# Patient Record
Sex: Male | Born: 1955
Health system: Southern US, Community
[De-identification: ages and names within clinical notes are randomized; demographics above are authoritative.]

## PROBLEM LIST (undated history)

## (undated) DIAGNOSIS — R61 Generalized hyperhidrosis: Secondary | ICD-10-CM

## (undated) DIAGNOSIS — M545 Low back pain, unspecified: Secondary | ICD-10-CM

## (undated) DIAGNOSIS — M961 Postlaminectomy syndrome, not elsewhere classified: Secondary | ICD-10-CM

## (undated) DIAGNOSIS — N319 Neuromuscular dysfunction of bladder, unspecified: Secondary | ICD-10-CM

## (undated) DIAGNOSIS — Z0000 Encounter for general adult medical examination without abnormal findings: Secondary | ICD-10-CM

## (undated) DIAGNOSIS — G8929 Other chronic pain: Secondary | ICD-10-CM

## (undated) DIAGNOSIS — M5136 Other intervertebral disc degeneration, lumbar region: Secondary | ICD-10-CM

## (undated) DIAGNOSIS — G894 Chronic pain syndrome: Secondary | ICD-10-CM

## (undated) DIAGNOSIS — B182 Chronic viral hepatitis C: Secondary | ICD-10-CM

## (undated) DIAGNOSIS — K118 Other diseases of salivary glands: Secondary | ICD-10-CM

## (undated) DIAGNOSIS — M47816 Spondylosis without myelopathy or radiculopathy, lumbar region: Secondary | ICD-10-CM

## (undated) DIAGNOSIS — N400 Enlarged prostate without lower urinary tract symptoms: Secondary | ICD-10-CM

## (undated) DIAGNOSIS — K219 Gastro-esophageal reflux disease without esophagitis: Secondary | ICD-10-CM

## (undated) DIAGNOSIS — N529 Male erectile dysfunction, unspecified: Secondary | ICD-10-CM

## (undated) DIAGNOSIS — E785 Hyperlipidemia, unspecified: Secondary | ICD-10-CM

## (undated) DIAGNOSIS — I10 Essential (primary) hypertension: Secondary | ICD-10-CM

## (undated) DIAGNOSIS — M25462 Effusion, left knee: Secondary | ICD-10-CM

## (undated) DIAGNOSIS — G4733 Obstructive sleep apnea (adult) (pediatric): Secondary | ICD-10-CM

## (undated) DIAGNOSIS — E119 Type 2 diabetes mellitus without complications: Secondary | ICD-10-CM

## (undated) DIAGNOSIS — F4310 Post-traumatic stress disorder, unspecified: Secondary | ICD-10-CM

## (undated) DIAGNOSIS — M1710 Unilateral primary osteoarthritis, unspecified knee: Secondary | ICD-10-CM

## (undated) DIAGNOSIS — IMO0001 Reserved for inherently not codable concepts without codable children: Secondary | ICD-10-CM

## (undated) DIAGNOSIS — R7303 Prediabetes: Secondary | ICD-10-CM

## (undated) DIAGNOSIS — M179 Osteoarthritis of knee, unspecified: Secondary | ICD-10-CM

## (undated) DIAGNOSIS — M549 Dorsalgia, unspecified: Secondary | ICD-10-CM

## (undated) DIAGNOSIS — R0600 Dyspnea, unspecified: Secondary | ICD-10-CM

## (undated) DIAGNOSIS — F32A Depression, unspecified: Secondary | ICD-10-CM

## (undated) DIAGNOSIS — Z5189 Encounter for other specified aftercare: Secondary | ICD-10-CM

## (undated) DIAGNOSIS — F329 Major depressive disorder, single episode, unspecified: Secondary | ICD-10-CM

## (undated) DIAGNOSIS — Z8619 Personal history of other infectious and parasitic diseases: Secondary | ICD-10-CM

## (undated) DIAGNOSIS — Z872 Personal history of diseases of the skin and subcutaneous tissue: Secondary | ICD-10-CM

## (undated) DIAGNOSIS — S838X9A Sprain of other specified parts of unspecified knee, initial encounter: Secondary | ICD-10-CM

## (undated) HISTORY — DX: Depression, unspecified: F32.A

## (undated) HISTORY — DX: Osteoarthritis of knee, unspecified: M17.9

## (undated) HISTORY — PX: COLON SURGERY: SHX602

## (undated) HISTORY — DX: Hyperlipidemia, unspecified: E78.5

## (undated) HISTORY — DX: Gastro-esophageal reflux disease without esophagitis: K21.9

## (undated) HISTORY — DX: Unilateral primary osteoarthritis, unspecified knee: M17.10

## (undated) HISTORY — PX: CARDIOVASCULAR STRESS TEST: SHX262

## (undated) HISTORY — DX: Benign prostatic hyperplasia without lower urinary tract symptoms: N40.0

## (undated) HISTORY — DX: Male erectile dysfunction, unspecified: N52.9

## (undated) HISTORY — DX: Major depressive disorder, single episode, unspecified: F32.9

## (undated) HISTORY — DX: Chronic viral hepatitis C: B18.2

## (undated) HISTORY — DX: Post-traumatic stress disorder, unspecified: F43.10

## (undated) MED ORDER — BUPRENORPHINE-NALOXONE 8 MG-2 MG SUBLINGUAL FILM
8-2 mg | ORAL_FILM | Freq: Three times a day (TID) | SUBLINGUAL | Status: DC
Start: ? — End: 2012-12-24

## (undated) MED ORDER — PSEUDOEPHEDRINE 30 MG TAB
30 mg | ORAL_TABLET | Freq: Four times a day (QID) | ORAL | Status: DC | PRN
Start: ? — End: 2014-06-24

## (undated) MED ORDER — PANTOPRAZOLE 20 MG TAB, DELAYED RELEASE
20 mg | ORAL_TABLET | Freq: Two times a day (BID) | ORAL | Status: DC
Start: ? — End: 2014-06-24

## (undated) MED ORDER — AZITHROMYCIN 250 MG TAB
250 mg | ORAL_TABLET | ORAL | Status: DC
Start: ? — End: 2012-10-18

## (undated) MED ORDER — FENTANYL 25 MCG/HR 72 HR TRANSDERM PATCH
25 mcg/hr | MEDICATED_PATCH | TRANSDERMAL | Status: DC
Start: ? — End: 2012-10-18

## (undated) MED ORDER — TADALAFIL 20 MG TABLET
20 mg | ORAL_TABLET | ORAL | Status: DC | PRN
Start: ? — End: 2014-06-24

## (undated) MED ORDER — FENTANYL 50 MCG/HR 72 HR TRANSDERM PATCH
50 mcg/hr | MEDICATED_PATCH | TRANSDERMAL | Status: DC
Start: ? — End: 2012-10-18

## (undated) MED ORDER — BUPRENORPHINE-NALOXONE 8 MG-2 MG SUBLINGUAL FILM
8-2 mg | ORAL_FILM | Freq: Three times a day (TID) | SUBLINGUAL | Status: DC
Start: ? — End: 2013-11-14

## (undated) MED ORDER — MECLIZINE 25 MG TAB
25 mg | ORAL_TABLET | Freq: Three times a day (TID) | ORAL | Status: AC | PRN
Start: ? — End: 2013-03-22

## (undated) MED ORDER — DULOXETINE 30 MG CAP, DELAYED RELEASE
30 mg | ORAL_CAPSULE | Freq: Every day | ORAL | Status: DC
Start: ? — End: 2012-10-18

## (undated) MED ORDER — MECLIZINE 25 MG TAB
25 mg | ORAL_TABLET | Freq: Three times a day (TID) | ORAL | Status: DC | PRN
Start: ? — End: 2014-06-24

## (undated) MED ORDER — DULOXETINE 60 MG CAP, DELAYED RELEASE
60 mg | ORAL_CAPSULE | Freq: Every day | ORAL | Status: DC
Start: ? — End: 2012-10-18

## (undated) MED ORDER — OXYCODONE-ACETAMINOPHEN 7.5 MG-325 MG TAB
ORAL_TABLET | Freq: Three times a day (TID) | ORAL | Status: DC | PRN
Start: ? — End: 2012-10-18

## (undated) MED ORDER — BUPRENORPHINE 5 MCG/HOUR WEEKLY TRANSDERMAL PATCH
5 mcg/hour | MEDICATED_PATCH | TRANSDERMAL | Status: AC
Start: ? — End: 2013-02-09

## (undated) MED ORDER — AZITHROMYCIN 250 MG TAB
250 mg | ORAL_TABLET | ORAL | Status: AC
Start: ? — End: 2013-03-17

## (undated) MED ORDER — LORAZEPAM 0.5 MG TAB
0.5 mg | ORAL_TABLET | Freq: Three times a day (TID) | ORAL | Status: DC | PRN
Start: ? — End: 2014-08-07

## (undated) MED ORDER — FLUTICASONE 50 MCG/ACTUATION NASAL SPRAY, SUSP
50 mcg/actuation | NASAL | Status: DC
Start: ? — End: 2014-08-07

## (undated) MED ORDER — LIDOCAINE 5 % (700 MG/PATCH) ADHESIVE PATCH
5 % | MEDICATED_PATCH | CUTANEOUS | Status: DC
Start: ? — End: 2012-12-24

## (undated) MED ORDER — METHYLPREDNISOLONE 4 MG TABS IN A DOSE PACK
4 mg | PACK | ORAL | Status: DC
Start: ? — End: 2012-10-18

## (undated) MED ORDER — BUPRENORPHINE-NALOXONE 8 MG-2 MG SUBLINGUAL TAB
8-2 mg | ORAL_TABLET | Freq: Three times a day (TID) | SUBLINGUAL | Status: DC | PRN
Start: ? — End: 2013-03-12

## (undated) MED ORDER — TADALAFIL 20 MG TABLET
20 mg | ORAL_TABLET | ORAL | Status: DC | PRN
Start: ? — End: 2012-11-18

## (undated) MED ORDER — SUCRALFATE 1 GRAM TAB
1 gram | ORAL_TABLET | Freq: Four times a day (QID) | ORAL | Status: DC
Start: ? — End: 2012-10-18

## (undated) MED ORDER — AMOXICILLIN 500 MG CAP
500 mg | ORAL_CAPSULE | Freq: Two times a day (BID) | ORAL | Status: AC
Start: ? — End: 2013-06-22

## (undated) MED ORDER — FENTANYL 50 MCG/HR 72 HR TRANSDERM PATCH
50 mcg/hr | MEDICATED_PATCH | TRANSDERMAL | Status: DC
Start: ? — End: 2012-11-12

## (undated) MED ORDER — OXYCODONE-ACETAMINOPHEN 7.5 MG-325 MG TAB
ORAL_TABLET | Freq: Three times a day (TID) | ORAL | Status: DC | PRN
Start: ? — End: 2012-11-12

---

## 1982-06-05 HISTORY — PX: LUMBAR FUSION: SHX111

## 1998-02-22 ENCOUNTER — Encounter: Admission: RE | Admit: 1998-02-22 | Discharge: 1998-05-23 | Payer: Self-pay

## 2000-07-02 ENCOUNTER — Emergency Department (HOSPITAL_COMMUNITY): Admission: EM | Admit: 2000-07-02 | Discharge: 2000-07-02 | Payer: Self-pay | Admitting: Emergency Medicine

## 2002-07-24 ENCOUNTER — Ambulatory Visit (HOSPITAL_COMMUNITY): Admission: RE | Admit: 2002-07-24 | Discharge: 2002-07-24 | Payer: Self-pay | Admitting: Internal Medicine

## 2002-07-24 ENCOUNTER — Encounter: Payer: Self-pay | Admitting: Internal Medicine

## 2002-09-24 ENCOUNTER — Encounter (INDEPENDENT_AMBULATORY_CARE_PROVIDER_SITE_OTHER): Payer: Self-pay | Admitting: Specialist

## 2002-09-24 ENCOUNTER — Encounter (INDEPENDENT_AMBULATORY_CARE_PROVIDER_SITE_OTHER): Payer: Self-pay | Admitting: *Deleted

## 2002-09-24 ENCOUNTER — Ambulatory Visit (HOSPITAL_COMMUNITY): Admission: RE | Admit: 2002-09-24 | Discharge: 2002-09-24 | Payer: Self-pay | Admitting: Internal Medicine

## 2003-03-17 ENCOUNTER — Encounter: Payer: Self-pay | Admitting: Internal Medicine

## 2003-03-26 ENCOUNTER — Encounter: Payer: Self-pay | Admitting: Internal Medicine

## 2003-03-26 ENCOUNTER — Ambulatory Visit (HOSPITAL_COMMUNITY): Admission: RE | Admit: 2003-03-26 | Discharge: 2003-03-26 | Payer: Self-pay | Admitting: Internal Medicine

## 2003-06-22 ENCOUNTER — Encounter: Admission: RE | Admit: 2003-06-22 | Discharge: 2003-09-08 | Payer: Self-pay | Admitting: Neurology

## 2003-09-10 ENCOUNTER — Encounter: Admission: RE | Admit: 2003-09-10 | Discharge: 2003-12-09 | Payer: Self-pay | Admitting: Neurology

## 2004-01-06 ENCOUNTER — Encounter: Payer: Self-pay | Admitting: Internal Medicine

## 2004-01-06 LAB — HM COLONOSCOPY

## 2004-05-16 ENCOUNTER — Ambulatory Visit: Payer: Self-pay | Admitting: Internal Medicine

## 2004-05-18 ENCOUNTER — Ambulatory Visit: Payer: Self-pay | Admitting: Internal Medicine

## 2004-06-05 HISTORY — PX: CERVICAL FUSION: SHX112

## 2004-08-15 ENCOUNTER — Ambulatory Visit: Payer: Self-pay | Admitting: *Deleted

## 2004-08-15 ENCOUNTER — Ambulatory Visit (HOSPITAL_BASED_OUTPATIENT_CLINIC_OR_DEPARTMENT_OTHER): Admission: RE | Admit: 2004-08-15 | Discharge: 2004-08-15 | Payer: Self-pay | Admitting: *Deleted

## 2005-03-27 ENCOUNTER — Ambulatory Visit: Payer: Self-pay | Admitting: Internal Medicine

## 2005-03-31 ENCOUNTER — Ambulatory Visit: Payer: Self-pay | Admitting: Internal Medicine

## 2005-08-02 ENCOUNTER — Ambulatory Visit: Payer: Self-pay | Admitting: Internal Medicine

## 2005-08-18 ENCOUNTER — Ambulatory Visit: Payer: Self-pay | Admitting: Internal Medicine

## 2005-11-01 ENCOUNTER — Ambulatory Visit: Payer: Self-pay | Admitting: Internal Medicine

## 2005-12-11 ENCOUNTER — Ambulatory Visit: Payer: Self-pay | Admitting: Internal Medicine

## 2005-12-11 ENCOUNTER — Encounter: Admission: RE | Admit: 2005-12-11 | Discharge: 2005-12-11 | Payer: Self-pay | Admitting: Surgery

## 2006-06-18 ENCOUNTER — Ambulatory Visit: Payer: Self-pay | Admitting: Internal Medicine

## 2006-06-18 LAB — CONVERTED CEMR LAB
AST: 27 units/L (ref 0–37)
Albumin: 3.5 g/dL (ref 3.5–5.2)
Bilirubin Urine: NEGATIVE
Chol/HDL Ratio, serum: 3.8
Cholesterol: 148 mg/dL (ref 0–200)
Eosinophil percent: 2.7 % (ref 0.0–5.0)
GFR calc non Af Amer: 75 mL/min
Glomerular Filtration Rate, Af Am: 90 mL/min/{1.73_m2}
LDL Cholesterol: 94 mg/dL (ref 0–99)
Leukocytes, UA: NEGATIVE
Lymphocytes Relative: 44.9 % (ref 12.0–46.0)
Neutro Abs: 2.4 10*3/uL (ref 1.4–7.7)
Neutrophils Relative %: 42.2 % — ABNORMAL LOW (ref 43.0–77.0)
PSA: 0.75 ng/mL (ref 0.10–4.00)
Platelets: 221 10*3/uL (ref 150–400)
RDW: 12.1 % (ref 11.5–14.6)
Sodium: 142 meq/L (ref 135–145)
TSH: 1.73 microintl units/mL (ref 0.35–5.50)
Total Bilirubin: 0.6 mg/dL (ref 0.3–1.2)
Total Protein, Urine: NEGATIVE mg/dL
Total Protein: 7.3 g/dL (ref 6.0–8.3)
Triglyceride fasting, serum: 78 mg/dL (ref 0–149)
Urobilinogen, UA: 0.2 (ref 0.0–1.0)
WBC: 5.8 10*3/uL (ref 4.5–10.5)

## 2006-07-13 ENCOUNTER — Ambulatory Visit: Payer: Self-pay | Admitting: Internal Medicine

## 2006-09-28 ENCOUNTER — Ambulatory Visit: Payer: Self-pay | Admitting: Internal Medicine

## 2006-10-09 ENCOUNTER — Ambulatory Visit: Payer: Self-pay | Admitting: Gastroenterology

## 2006-11-05 ENCOUNTER — Ambulatory Visit: Payer: Self-pay | Admitting: Internal Medicine

## 2007-01-07 ENCOUNTER — Ambulatory Visit: Payer: Self-pay | Admitting: Internal Medicine

## 2007-01-12 ENCOUNTER — Encounter: Payer: Self-pay | Admitting: Internal Medicine

## 2007-01-12 DIAGNOSIS — Z9189 Other specified personal risk factors, not elsewhere classified: Secondary | ICD-10-CM | POA: Insufficient documentation

## 2007-01-12 DIAGNOSIS — B1710 Acute hepatitis C without hepatic coma: Secondary | ICD-10-CM | POA: Insufficient documentation

## 2007-01-12 DIAGNOSIS — F32A Depression, unspecified: Secondary | ICD-10-CM | POA: Insufficient documentation

## 2007-01-12 DIAGNOSIS — M545 Low back pain, unspecified: Secondary | ICD-10-CM | POA: Insufficient documentation

## 2007-01-12 DIAGNOSIS — F329 Major depressive disorder, single episode, unspecified: Secondary | ICD-10-CM | POA: Insufficient documentation

## 2007-01-14 ENCOUNTER — Encounter: Admission: RE | Admit: 2007-01-14 | Discharge: 2007-01-14 | Payer: Self-pay | Admitting: Internal Medicine

## 2007-01-14 ENCOUNTER — Encounter (INDEPENDENT_AMBULATORY_CARE_PROVIDER_SITE_OTHER): Payer: Self-pay | Admitting: *Deleted

## 2007-02-14 ENCOUNTER — Encounter (INDEPENDENT_AMBULATORY_CARE_PROVIDER_SITE_OTHER): Payer: Self-pay | Admitting: *Deleted

## 2007-02-14 ENCOUNTER — Ambulatory Visit (HOSPITAL_COMMUNITY): Admission: RE | Admit: 2007-02-14 | Discharge: 2007-02-14 | Payer: Self-pay | Admitting: Surgery

## 2007-02-14 HISTORY — PX: OTHER SURGICAL HISTORY: SHX169

## 2007-03-19 ENCOUNTER — Ambulatory Visit: Payer: Self-pay | Admitting: Internal Medicine

## 2007-03-20 ENCOUNTER — Encounter: Payer: Self-pay | Admitting: Internal Medicine

## 2007-03-20 DIAGNOSIS — J309 Allergic rhinitis, unspecified: Secondary | ICD-10-CM | POA: Insufficient documentation

## 2007-03-20 DIAGNOSIS — Z9889 Other specified postprocedural states: Secondary | ICD-10-CM | POA: Insufficient documentation

## 2007-03-20 DIAGNOSIS — M5030 Other cervical disc degeneration, unspecified cervical region: Secondary | ICD-10-CM | POA: Insufficient documentation

## 2007-03-20 DIAGNOSIS — Z8639 Personal history of other endocrine, nutritional and metabolic disease: Secondary | ICD-10-CM

## 2007-03-20 DIAGNOSIS — Z862 Personal history of diseases of the blood and blood-forming organs and certain disorders involving the immune mechanism: Secondary | ICD-10-CM | POA: Insufficient documentation

## 2007-03-20 DIAGNOSIS — F528 Other sexual dysfunction not due to a substance or known physiological condition: Secondary | ICD-10-CM | POA: Insufficient documentation

## 2007-03-20 DIAGNOSIS — F4310 Post-traumatic stress disorder, unspecified: Secondary | ICD-10-CM | POA: Insufficient documentation

## 2007-03-20 DIAGNOSIS — E785 Hyperlipidemia, unspecified: Secondary | ICD-10-CM | POA: Insufficient documentation

## 2007-03-20 DIAGNOSIS — M25569 Pain in unspecified knee: Secondary | ICD-10-CM | POA: Insufficient documentation

## 2007-04-15 ENCOUNTER — Telehealth (INDEPENDENT_AMBULATORY_CARE_PROVIDER_SITE_OTHER): Payer: Self-pay | Admitting: *Deleted

## 2007-05-01 ENCOUNTER — Encounter: Payer: Self-pay | Admitting: Internal Medicine

## 2007-05-06 ENCOUNTER — Ambulatory Visit: Payer: Self-pay | Admitting: Internal Medicine

## 2007-05-06 DIAGNOSIS — R209 Unspecified disturbances of skin sensation: Secondary | ICD-10-CM | POA: Insufficient documentation

## 2007-05-15 ENCOUNTER — Encounter: Payer: Self-pay | Admitting: Internal Medicine

## 2007-05-20 ENCOUNTER — Ambulatory Visit: Payer: Self-pay | Admitting: Internal Medicine

## 2007-05-20 DIAGNOSIS — M79609 Pain in unspecified limb: Secondary | ICD-10-CM | POA: Insufficient documentation

## 2007-05-20 DIAGNOSIS — R079 Chest pain, unspecified: Secondary | ICD-10-CM | POA: Insufficient documentation

## 2007-05-26 ENCOUNTER — Encounter: Admission: RE | Admit: 2007-05-26 | Discharge: 2007-05-26 | Payer: Self-pay | Admitting: Internal Medicine

## 2007-06-03 ENCOUNTER — Ambulatory Visit: Payer: Self-pay | Admitting: Internal Medicine

## 2007-06-03 DIAGNOSIS — F411 Generalized anxiety disorder: Secondary | ICD-10-CM | POA: Insufficient documentation

## 2007-06-03 DIAGNOSIS — R32 Unspecified urinary incontinence: Secondary | ICD-10-CM | POA: Insufficient documentation

## 2007-06-04 ENCOUNTER — Telehealth: Payer: Self-pay | Admitting: Internal Medicine

## 2007-06-04 ENCOUNTER — Encounter: Payer: Self-pay | Admitting: Internal Medicine

## 2007-06-04 LAB — CONVERTED CEMR LAB
Bilirubin Urine: NEGATIVE
Ketones, ur: NEGATIVE mg/dL
Nitrite: NEGATIVE
RBC / HPF: NONE SEEN (ref <3)
WBC, UA: NONE SEEN cells/hpf (ref <3)

## 2007-06-12 ENCOUNTER — Encounter: Payer: Self-pay | Admitting: Internal Medicine

## 2007-07-08 ENCOUNTER — Encounter: Payer: Self-pay | Admitting: Internal Medicine

## 2007-07-23 ENCOUNTER — Ambulatory Visit: Payer: Self-pay | Admitting: Gastroenterology

## 2007-07-24 ENCOUNTER — Encounter: Payer: Self-pay | Admitting: Internal Medicine

## 2007-07-31 ENCOUNTER — Encounter: Payer: Self-pay | Admitting: Internal Medicine

## 2007-08-18 ENCOUNTER — Encounter: Admission: RE | Admit: 2007-08-18 | Discharge: 2007-08-18 | Payer: Self-pay | Admitting: Specialist

## 2007-08-22 ENCOUNTER — Ambulatory Visit: Payer: Self-pay | Admitting: Internal Medicine

## 2007-08-22 DIAGNOSIS — J019 Acute sinusitis, unspecified: Secondary | ICD-10-CM | POA: Insufficient documentation

## 2007-09-05 ENCOUNTER — Ambulatory Visit: Payer: Self-pay | Admitting: Internal Medicine

## 2007-09-05 LAB — CONVERTED CEMR LAB
Albumin: 3.6 g/dL (ref 3.5–5.2)
Basophils Absolute: 0 10*3/uL (ref 0.0–0.1)
Bilirubin Urine: NEGATIVE
Calcium: 9.2 mg/dL (ref 8.4–10.5)
Eosinophils Absolute: 0.1 10*3/uL (ref 0.0–0.7)
Eosinophils Relative: 1.6 % (ref 0.0–5.0)
GFR calc Af Amer: 101 mL/min
GFR calc non Af Amer: 83 mL/min
HCT: 49.6 % (ref 39.0–52.0)
Hemoglobin, Urine: NEGATIVE
Ketones, ur: NEGATIVE mg/dL
LDL Cholesterol: 103 mg/dL — ABNORMAL HIGH (ref 0–99)
Leukocytes, UA: NEGATIVE
MCHC: 32.4 g/dL (ref 30.0–36.0)
MCV: 90.7 fL (ref 78.0–100.0)
Neutrophils Relative %: 51.3 % (ref 43.0–77.0)
Nitrite: NEGATIVE
PSA: 0.85 ng/mL (ref 0.10–4.00)
Platelets: 180 10*3/uL (ref 150–400)
RDW: 12 % (ref 11.5–14.6)
Sodium: 141 meq/L (ref 135–145)
Specific Gravity, Urine: 1.025 (ref 1.000–1.03)
TSH: 1.29 microintl units/mL (ref 0.35–5.50)
Urine Glucose: NEGATIVE mg/dL

## 2007-09-12 ENCOUNTER — Ambulatory Visit: Payer: Self-pay | Admitting: Internal Medicine

## 2007-09-12 ENCOUNTER — Encounter (INDEPENDENT_AMBULATORY_CARE_PROVIDER_SITE_OTHER): Payer: Self-pay | Admitting: *Deleted

## 2007-09-12 DIAGNOSIS — Z860100 Personal history of colon polyps, unspecified: Secondary | ICD-10-CM | POA: Insufficient documentation

## 2007-09-12 DIAGNOSIS — G4733 Obstructive sleep apnea (adult) (pediatric): Secondary | ICD-10-CM | POA: Insufficient documentation

## 2007-09-19 ENCOUNTER — Ambulatory Visit: Payer: Self-pay | Admitting: Internal Medicine

## 2007-09-20 LAB — CONVERTED CEMR LAB
BUN: 21 mg/dL (ref 6–23)
CO2: 33 meq/L — ABNORMAL HIGH (ref 19–32)
Calcium: 9.1 mg/dL (ref 8.4–10.5)
Chloride: 103 meq/L (ref 96–112)
Creatinine, Ser: 1 mg/dL (ref 0.4–1.5)
GFR calc non Af Amer: 83 mL/min
Hgb A1c MFr Bld: 6.6 % — ABNORMAL HIGH (ref 4.6–6.0)
Potassium: 4.6 meq/L (ref 3.5–5.1)

## 2007-09-26 ENCOUNTER — Encounter: Admission: RE | Admit: 2007-09-26 | Discharge: 2007-09-26 | Payer: Self-pay | Admitting: Specialist

## 2007-10-02 ENCOUNTER — Encounter: Admission: RE | Admit: 2007-10-02 | Discharge: 2007-10-02 | Payer: Self-pay | Admitting: Specialist

## 2007-10-03 ENCOUNTER — Encounter: Admission: RE | Admit: 2007-10-03 | Discharge: 2007-10-03 | Payer: Self-pay | Admitting: Specialist

## 2007-10-07 ENCOUNTER — Encounter: Payer: Self-pay | Admitting: Internal Medicine

## 2007-10-10 ENCOUNTER — Encounter: Admission: RE | Admit: 2007-10-10 | Discharge: 2007-10-10 | Payer: Self-pay | Admitting: Specialist

## 2007-10-18 ENCOUNTER — Ambulatory Visit (HOSPITAL_BASED_OUTPATIENT_CLINIC_OR_DEPARTMENT_OTHER): Admission: RE | Admit: 2007-10-18 | Discharge: 2007-10-18 | Payer: Self-pay | Admitting: Specialist

## 2007-10-18 HISTORY — PX: KNEE ARTHROSCOPY W/ MENISCECTOMY: SHX1879

## 2007-10-24 ENCOUNTER — Encounter: Payer: Self-pay | Admitting: Internal Medicine

## 2007-11-27 ENCOUNTER — Ambulatory Visit: Payer: Self-pay | Admitting: Internal Medicine

## 2008-01-13 ENCOUNTER — Ambulatory Visit: Payer: Self-pay | Admitting: Internal Medicine

## 2008-01-13 DIAGNOSIS — R21 Rash and other nonspecific skin eruption: Secondary | ICD-10-CM | POA: Insufficient documentation

## 2008-02-19 ENCOUNTER — Ambulatory Visit: Payer: Self-pay | Admitting: Internal Medicine

## 2008-02-19 DIAGNOSIS — H6500 Acute serous otitis media, unspecified ear: Secondary | ICD-10-CM | POA: Insufficient documentation

## 2008-03-20 ENCOUNTER — Encounter: Payer: Self-pay | Admitting: Internal Medicine

## 2008-03-26 ENCOUNTER — Encounter: Payer: Self-pay | Admitting: Internal Medicine

## 2008-03-26 ENCOUNTER — Ambulatory Visit: Payer: Self-pay | Admitting: Gastroenterology

## 2008-04-01 ENCOUNTER — Encounter: Admission: RE | Admit: 2008-04-01 | Discharge: 2008-04-01 | Payer: Self-pay | Admitting: Specialist

## 2008-04-01 ENCOUNTER — Ambulatory Visit (HOSPITAL_COMMUNITY): Admission: RE | Admit: 2008-04-01 | Discharge: 2008-04-01 | Payer: Self-pay | Admitting: Gastroenterology

## 2008-04-01 ENCOUNTER — Encounter (INDEPENDENT_AMBULATORY_CARE_PROVIDER_SITE_OTHER): Payer: Self-pay | Admitting: *Deleted

## 2008-05-25 ENCOUNTER — Encounter: Payer: Self-pay | Admitting: Internal Medicine

## 2008-06-08 ENCOUNTER — Ambulatory Visit: Payer: Self-pay | Admitting: Internal Medicine

## 2008-06-08 ENCOUNTER — Encounter: Admission: RE | Admit: 2008-06-08 | Discharge: 2008-06-08 | Payer: Self-pay | Admitting: Neurology

## 2008-06-08 DIAGNOSIS — E119 Type 2 diabetes mellitus without complications: Secondary | ICD-10-CM | POA: Insufficient documentation

## 2008-06-08 LAB — CONVERTED CEMR LAB
CO2: 33 meq/L — ABNORMAL HIGH (ref 19–32)
H Pylori IgG: NEGATIVE
HDL: 44.5 mg/dL (ref >39.0)
LDL Cholesterol: 107 mg/dL — ABNORMAL HIGH (ref 0–99)
Potassium: 4.5 meq/L (ref 3.5–5.1)
VLDL: 15 mg/dL (ref 0–40)

## 2008-06-15 ENCOUNTER — Encounter: Payer: Self-pay | Admitting: Internal Medicine

## 2008-06-15 ENCOUNTER — Ambulatory Visit: Payer: Self-pay

## 2008-09-17 ENCOUNTER — Telehealth (INDEPENDENT_AMBULATORY_CARE_PROVIDER_SITE_OTHER): Payer: Self-pay | Admitting: *Deleted

## 2008-09-30 ENCOUNTER — Ambulatory Visit: Payer: Self-pay | Admitting: Internal Medicine

## 2008-09-30 ENCOUNTER — Telehealth: Payer: Self-pay | Admitting: Internal Medicine

## 2008-10-01 LAB — CONVERTED CEMR LAB
BUN: 13 mg/dL (ref 6–23)
Basophils Relative: 0.1 % (ref 0.0–3.0)
Bilirubin Urine: NEGATIVE
CO2: 29 meq/L (ref 19–32)
Eosinophils Absolute: 0 10*3/uL (ref 0.0–0.7)
GFR calc non Af Amer: 112.65 mL/min (ref >60)
Glucose, Bld: 112 mg/dL — ABNORMAL HIGH (ref 70–99)
HDL: 39 mg/dL — ABNORMAL LOW (ref >39.00)
Ketones, ur: NEGATIVE mg/dL
Leukocytes, UA: NEGATIVE
Lymphs Abs: 1.5 10*3/uL (ref 0.7–4.0)
MCHC: 34.3 g/dL (ref 30.0–36.0)
MCV: 92 fL (ref 78.0–100.0)
Monocytes Absolute: 0.4 10*3/uL (ref 0.1–1.0)
Monocytes Relative: 7.5 % (ref 3.0–12.0)
Neutro Abs: 3.4 10*3/uL (ref 1.4–7.7)
Nitrite: NEGATIVE
Platelets: 167 10*3/uL (ref 150.0–400.0)
RDW: 12.3 % (ref 11.5–14.6)
TSH: 0.95 microintl units/mL (ref 0.35–5.50)
Total Bilirubin: 1.1 mg/dL (ref 0.3–1.2)
Total CHOL/HDL Ratio: 5
Total Protein: 7.6 g/dL (ref 6.0–8.3)
Triglycerides: 88 mg/dL (ref 0.0–149.0)
Urine Glucose: NEGATIVE mg/dL
Urobilinogen, UA: 0.2 (ref 0.0–1.0)

## 2008-10-02 ENCOUNTER — Ambulatory Visit: Payer: Self-pay | Admitting: Internal Medicine

## 2008-12-15 ENCOUNTER — Telehealth: Payer: Self-pay | Admitting: Internal Medicine

## 2008-12-31 ENCOUNTER — Encounter: Payer: Self-pay | Admitting: Internal Medicine

## 2009-01-08 ENCOUNTER — Ambulatory Visit: Payer: Self-pay | Admitting: Internal Medicine

## 2009-01-08 DIAGNOSIS — L290 Pruritus ani: Secondary | ICD-10-CM | POA: Insufficient documentation

## 2009-01-08 DIAGNOSIS — L989 Disorder of the skin and subcutaneous tissue, unspecified: Secondary | ICD-10-CM | POA: Insufficient documentation

## 2009-01-08 DIAGNOSIS — N476 Balanoposthitis: Secondary | ICD-10-CM | POA: Insufficient documentation

## 2009-04-23 ENCOUNTER — Ambulatory Visit: Payer: Self-pay | Admitting: Internal Medicine

## 2009-04-23 ENCOUNTER — Encounter: Admission: RE | Admit: 2009-04-23 | Discharge: 2009-04-23 | Payer: Self-pay | Admitting: Specialist

## 2009-05-05 ENCOUNTER — Encounter: Payer: Self-pay | Admitting: Internal Medicine

## 2009-05-10 ENCOUNTER — Telehealth (INDEPENDENT_AMBULATORY_CARE_PROVIDER_SITE_OTHER): Payer: Self-pay | Admitting: *Deleted

## 2009-07-01 ENCOUNTER — Ambulatory Visit: Payer: Self-pay | Admitting: Internal Medicine

## 2009-07-01 DIAGNOSIS — K219 Gastro-esophageal reflux disease without esophagitis: Secondary | ICD-10-CM | POA: Insufficient documentation

## 2009-07-22 ENCOUNTER — Ambulatory Visit: Payer: Self-pay | Admitting: Internal Medicine

## 2009-07-26 ENCOUNTER — Telehealth: Payer: Self-pay | Admitting: Internal Medicine

## 2009-07-26 ENCOUNTER — Encounter (INDEPENDENT_AMBULATORY_CARE_PROVIDER_SITE_OTHER): Payer: Self-pay | Admitting: *Deleted

## 2009-08-26 ENCOUNTER — Encounter: Payer: Self-pay | Admitting: Internal Medicine

## 2009-08-26 ENCOUNTER — Ambulatory Visit: Payer: Self-pay | Admitting: Gastroenterology

## 2009-09-21 ENCOUNTER — Telehealth: Payer: Self-pay | Admitting: Internal Medicine

## 2009-09-24 ENCOUNTER — Ambulatory Visit (HOSPITAL_COMMUNITY): Admission: RE | Admit: 2009-09-24 | Discharge: 2009-09-24 | Payer: Self-pay | Admitting: Gastroenterology

## 2009-09-24 ENCOUNTER — Encounter (INDEPENDENT_AMBULATORY_CARE_PROVIDER_SITE_OTHER): Payer: Self-pay | Admitting: *Deleted

## 2009-09-28 ENCOUNTER — Ambulatory Visit: Payer: Self-pay | Admitting: Internal Medicine

## 2009-09-29 LAB — CONVERTED CEMR LAB
ALT: 38 units/L (ref 0–53)
AST: 35 units/L (ref 0–37)
BUN: 13 mg/dL (ref 6–23)
Basophils Absolute: 0 10*3/uL (ref 0.0–0.1)
Bilirubin, Direct: 0.2 mg/dL (ref 0.0–0.3)
Chloride: 102 meq/L (ref 96–112)
Cholesterol: 158 mg/dL (ref 0–200)
Eosinophils Relative: 1.9 % (ref 0.0–5.0)
GFR calc non Af Amer: 112.24 mL/min (ref >60)
Glucose, Bld: 97 mg/dL (ref 70–99)
HCT: 46.1 % (ref 39.0–52.0)
HDL: 40.7 mg/dL (ref >39.00)
Hemoglobin, Urine: NEGATIVE
Lymphocytes Relative: 40.4 % (ref 12.0–46.0)
Lymphs Abs: 2.6 10*3/uL (ref 0.7–4.0)
MCHC: 33.9 g/dL (ref 30.0–36.0)
MCV: 90.8 fL (ref 78.0–100.0)
Nitrite: NEGATIVE
PSA: 1.29 ng/mL (ref 0.10–4.00)
Specific Gravity, Urine: >=1.03 (ref 1.000–1.030)
TSH: 1.61 microintl units/mL (ref 0.35–5.50)
Total Bilirubin: 0.8 mg/dL (ref 0.3–1.2)
Total CHOL/HDL Ratio: 4
Triglycerides: 96 mg/dL (ref 0.0–149.0)
WBC: 6.5 10*3/uL (ref 4.5–10.5)

## 2009-10-06 ENCOUNTER — Ambulatory Visit: Payer: Self-pay | Admitting: Internal Medicine

## 2009-10-06 DIAGNOSIS — L723 Sebaceous cyst: Secondary | ICD-10-CM | POA: Insufficient documentation

## 2009-10-06 DIAGNOSIS — G894 Chronic pain syndrome: Secondary | ICD-10-CM | POA: Insufficient documentation

## 2009-10-06 DIAGNOSIS — M1710 Unilateral primary osteoarthritis, unspecified knee: Secondary | ICD-10-CM

## 2009-10-06 DIAGNOSIS — IMO0002 Reserved for concepts with insufficient information to code with codable children: Secondary | ICD-10-CM | POA: Insufficient documentation

## 2009-10-08 ENCOUNTER — Encounter (INDEPENDENT_AMBULATORY_CARE_PROVIDER_SITE_OTHER): Payer: Self-pay | Admitting: *Deleted

## 2009-10-08 ENCOUNTER — Ambulatory Visit (HOSPITAL_COMMUNITY): Admission: RE | Admit: 2009-10-08 | Discharge: 2009-10-08 | Payer: Self-pay | Admitting: Gastroenterology

## 2009-10-12 ENCOUNTER — Ambulatory Visit (HOSPITAL_BASED_OUTPATIENT_CLINIC_OR_DEPARTMENT_OTHER): Admission: RE | Admit: 2009-10-12 | Discharge: 2009-10-12 | Payer: Self-pay | Admitting: Urology

## 2009-10-12 HISTORY — PX: CIRCUMCISION: SUR203

## 2010-01-03 ENCOUNTER — Telehealth: Payer: Self-pay | Admitting: Internal Medicine

## 2010-01-20 ENCOUNTER — Telehealth: Payer: Self-pay | Admitting: Internal Medicine

## 2010-02-01 ENCOUNTER — Telehealth: Payer: Self-pay | Admitting: Internal Medicine

## 2010-04-05 ENCOUNTER — Ambulatory Visit: Payer: Self-pay | Admitting: Internal Medicine

## 2010-04-05 LAB — CONVERTED CEMR LAB
Calcium: 9.1 mg/dL (ref 8.4–10.5)
Creatinine, Ser: 1.2 mg/dL (ref 0.4–1.5)
GFR calc non Af Amer: 84.43 mL/min (ref >60)
Glucose, Bld: 113 mg/dL — ABNORMAL HIGH (ref 70–99)
Hgb A1c MFr Bld: 5.8 % (ref 4.6–6.5)
Sodium: 139 meq/L (ref 135–145)
Triglycerides: 103 mg/dL (ref 0.0–149.0)

## 2010-04-08 ENCOUNTER — Ambulatory Visit: Payer: Self-pay | Admitting: Internal Medicine

## 2010-04-08 DIAGNOSIS — R109 Unspecified abdominal pain: Secondary | ICD-10-CM | POA: Insufficient documentation

## 2010-04-19 ENCOUNTER — Ambulatory Visit: Payer: Self-pay | Admitting: Internal Medicine

## 2010-04-19 DIAGNOSIS — M25559 Pain in unspecified hip: Secondary | ICD-10-CM | POA: Insufficient documentation

## 2010-04-25 ENCOUNTER — Ambulatory Visit: Payer: Self-pay | Admitting: Endocrinology

## 2010-04-25 DIAGNOSIS — H6690 Otitis media, unspecified, unspecified ear: Secondary | ICD-10-CM | POA: Insufficient documentation

## 2010-05-12 ENCOUNTER — Telehealth: Payer: Self-pay | Admitting: Internal Medicine

## 2010-06-10 ENCOUNTER — Telehealth: Payer: Self-pay | Admitting: Internal Medicine

## 2010-06-14 ENCOUNTER — Encounter (INDEPENDENT_AMBULATORY_CARE_PROVIDER_SITE_OTHER): Payer: Self-pay | Admitting: *Deleted

## 2010-06-26 ENCOUNTER — Encounter: Payer: Self-pay | Admitting: Gastroenterology

## 2010-07-03 LAB — CONVERTED CEMR LAB: Hgb A1c MFr Bld: 6.3 % — ABNORMAL HIGH (ref 4.6–6.0)

## 2010-07-05 NOTE — Assessment & Plan Note (Signed)
Summary: DR Sammuel Cooper PT/NO SLOT-CHILLS-ACHES-SWEATS-COUGH  --STC   Vital Signs:  Patient profile:   55 year old male Height:      70 inches (177.80 cm) Weight:      225.50 pounds (102.50 kg) BMI:     32.47 O2 Sat:      95 % on Room air Temp:     98.4 degrees F (36.89 degrees C) oral Pulse rate:   78 / minute BP sitting:   118 / 82  (left arm) Cuff size:   large  Vitals Entered By: Brenton Grills CMA Duncan Dull) (April 25, 2010 2:29 PM)  O2 Flow:  Room air CC: Cough, chills, body aches, sore throat, fatigue, Left Ear ache x 4 days Is Patient Diabetic? Yes   CC:  Cough, chills, body aches, sore throat, fatigue, and Left Ear ache x 4 days.  History of Present Illness: pt states 4 days of slight pain at the right ear, and assoc diaphoresis, and chills.    Current Medications (verified): 1)  Fluticasone Propionate 50 Mcg/act Susp (Fluticasone Propionate) .... Spray 2 Spray Into Both  Nostrils Once A Day 2)  Cialis 5 Mg Tabs (Tadalafil) .Marland Kitchen.. 1po Once Daily 3)  Roxanol 20 Mg/ml Oral Soln (Morphine Sulfate) .Marland Kitchen.. 100 Cc - Use Asd - 1 - 2 Cc By Mouth Q 4 Hrs As Needed - To Fill Apr 18, 2010 4)  Omeprazole 20 Mg Cpdr (Omeprazole) .Marland Kitchen.. 1 By Mouth Two Times A Day 5)  Vesicare 10 Mg Tabs (Solifenacin Succinate) .Marland Kitchen.. 1 By Mouth Once Daily 6)  Cymbalta 60 Mg Cpep (Duloxetine Hcl) .Marland Kitchen.. 1 By Mouth Once Daily 7)  Clonazepam 0.5 Mg Tabs (Clonazepam) .... 1/2 By Mouth Qhs 8)  Butrans 10 Mcg/hr Ptwk (Buprenorphine) .Marland Kitchen.. 1 Patch Asd Per Wk - To Fill Apr 08, 2010 9)  Levitra 20 Mg Tabs (Vardenafil Hcl) .Marland Kitchen.. 1po Every Other Day As Needed  Allergies (verified): 1)  ! * Flexeril 5 Mg 2)  ! * Latex  Past History:  Past Medical History: Last updated: 10/06/2009 Depression Hepatitis C PTSD chronic pain syndrome - cervical/lumbar Hyperlipidemia E.D. h/o pos PPD - s/p INH Allergic rhinitis Colonic polyps, hx of OSA Diabetes mellitus, type II - diet GERD knee DJD - end -stage  - Dr Shelle Iron  Review  of Systems  The patient denies fever.         sore throat is improved.    denies n/v  Physical Exam  General:  normal appearance.   Head:  head: no deformity eyes: no periorbital swelling, no proptosis external nose and ears are normal mouth: no lesion seen Ears:  both tm's are slightly red Neck:  Supple without thyroid enlargement or tenderness.  Lungs:  Clear to auscultation bilaterally. Normal respiratory effort.  Heart:  Regular rate and rhythm without murmurs or gallops noted. Normal S1,S2.     Impression & Recommendations:  Problem # 1:  OTITIS MEDIA, ACUTE (ICD-382.9) Assessment New  Medications Added to Medication List This Visit: 1)  Azithromycin 500 Mg Tabs (Azithromycin) .Marland Kitchen.. 1 tab once daily  Other Orders: Est. Patient Level III (44034)  Patient Instructions: 1)  azithromycin 500 mg once daily 2)  see dr Jonny Ruiz next week if you are not better Prescriptions: AZITHROMYCIN 500 MG TABS (AZITHROMYCIN) 1 tab once daily  #6 x 0   Entered and Authorized by:   Minus Breeding MD   Signed by:   Minus Breeding MD on 04/25/2010   Method used:  Electronically to        [facility]* (retail)       [address]       [place], [place]  [address]       Ph: [phone]       Fax: [phone]   RxID:   [id number]    Orders Added: 1)  Est. Patient Level III [15176]

## 2010-07-05 NOTE — Progress Notes (Signed)
Summary: medication change  Phone Note From Pharmacy   Caller: [facility]* Summary of Call: Pharmacy stated pt. said his dose of Omeprazole had been increased to 2 per day? Initial call taken by: Robin Ewing CMA Duncan Dull),  January 20, 2010 11:24 AM  Follow-up for Phone Call        ok - to incr to bid Follow-up by: Corwin Levins MD,  January 20, 2010 1:49 PM    New/Updated Medications: OMEPRAZOLE 20 MG CPDR (OMEPRAZOLE) 1 by mouth two times a day Prescriptions: OMEPRAZOLE 20 MG CPDR (OMEPRAZOLE) 1 by mouth two times a day  #60 x 11   Entered and Authorized by:   Corwin Levins MD   Signed by:   Corwin Levins MD on 01/20/2010   Method used:   Electronically to        [facility]* (retail)       [address]       [place], [place]  [address]       Ph: [phone]       Fax: [phone]   RxID:   [id number]

## 2010-07-05 NOTE — Assessment & Plan Note (Signed)
Summary: CPX /  [facility] Natale Milch  #   Vital Signs:  Patient profile:   55 year old male Height:      70 inches Weight:      235.50 pounds BMI:     33.91 O2 Sat:      95 % on Room air Temp:     97.8 degrees F oral Pulse rate:   70 / minute BP sitting:   110 / 74  (left arm) Cuff size:   large  Vitals Entered ByZella Ball Ewing (Oct 06, 2009 1:16 PM)  O2 Flow:  Room air  CC: Adult Physical/RE   CC:  Adult Physical/RE.  History of Present Illness: lost 11 lbs in last yr with better diet and excercie;  due for circumsioin may 10 with urology;  also had a ? boil to the right post neck /occiput area last mon, "burst" but seems to keep coming back;  Pt denies CP, sob, doe, wheezing, orthopnea, pnd, worsening LE edema, palps, dizziness or syncope  Pt denies new neuro symptoms such as headache, facial or extremity weakness   Problems Prior to Update: 1)  Gerd  (ICD-530.81) 2)  Rash-nonvesicular  (ICD-782.1) 3)  Anal Pruritus  (ICD-698.0) 4)  Skin Lesion  (ICD-709.9) 5)  Balanitis  (ICD-607.1) 6)  Diabetes Mellitus, Type II  (ICD-250.00) 7)  Chest Pain  (ICD-786.50) 8)  Otitis Media, Serous, Acute, Right  (ICD-381.01) 9)  Rash-nonvesicular  (ICD-782.1) 10)  Obstructive Sleep Apnea  (ICD-327.23) 11)  Colonic Polyps, Hx of  (ICD-V12.72) 12)  Contact With or Exposure To Venereal Diseases  (ICD-V01.6) 13)  Preventive Health Care  (ICD-V70.0) 14)  Sinusitis- Acute-nos  (ICD-461.9) 15)  Anxiety  (ICD-300.00) 16)  Urinary Incontinence, Male  (ICD-788.30) 17)  Foot Pain, Left  (ICD-729.5) 18)  Chest Pain  (ICD-786.50) 19)  Paresthesia  (ICD-782.0) 20)  Knee Pain, Left  (ICD-719.46) 21)  Family History of Alcoholism/addiction  (ICD-V61.41) 22)  Arthroscopy, Knee, Hx of  (ICD-V45.89) 23)  Glucose Intolerance, Minimal, Hx of  (ICD-V12.2) 24)  Allergic Rhinitis  (ICD-477.9) 25)  Disc Disease, Cervical  (ICD-722.4) 26)  Positive Ppd  (ICD-795.5) 27)  Erectile Dysfunction   (ICD-302.72) 28)  Hyperlipidemia  (ICD-272.4) 29)  Post Traumatic Stress Syndrome  (ICD-309.81) 30)  Needle Biopsy, Liver, Hx of  (ICD-V15.89) 31)  Low Back Pain, Chronic  (ICD-724.2) 32)  Hepatitis C  (ICD-070.51) 33)  Depression  (ICD-311)  Medications Prior to Update: 1)  Fluticasone Propionate 50 Mcg/act Susp (Fluticasone Propionate) .... Spray 2 Spray Into Both  Nostrils Once A Day 2)  Ms Contin 60 Mg  Tb12 (Morphine Sulfate) .Marland Kitchen.. 1 By Mouth Three Times A Day - To Fill Sep 20, 2009 3)  Cialis 20 Mg  Tabs (Tadalafil) .Marland Kitchen.. 1 By Mouth Once Daily Prn 4)  Ms Contin 15 Mg  Tb12 (Morphine Sulfate) .Marland Kitchen.. 1 By Mouth Three Times A Day - To Fill Sep 20, 2009 5)  Roxanol 20 Mg/ml Oral Soln (Morphine Sulfate) .Marland Kitchen.. 100 Cc - Use Asd - 1 - 2 Cc By Mouth Q 4 Hrs As Needed - To Fill Sep 20, 2009 6)  Cetirizine Hcl 10 Mg Tabs (Cetirizine Hcl) .Marland Kitchen.. 1 By Mouth Once Daily 7)  Omeprazole 20 Mg Cpdr (Omeprazole) .Marland Kitchen.. 1 By Mouth Once Daily 8)  Vesicare 10 Mg Tabs (Solifenacin Succinate) .Marland Kitchen.. 1 By Mouth Once Daily 9)  Neurontin 100 Mg Caps (Gabapentin) .... 1/2 By Mouth Three Times A Day 10)  Lotrisone  1-0.05 % Crea (Clotrimazole-Betamethasone) .... Use Asd Two Times A Day As Needed To Affected Area 11)  Clonazepam 0.5 Mg Tabs (Clonazepam) .... 1/2 By Mouth Qhs 12)  Tramadol Hcl 50 Mg Tabs (Tramadol Hcl) .Marland Kitchen.. 1 By Mouth Q 6 Hrs As Needed 13)  Promethazine-Codeine 6.25-10 Mg/43ml Syrp (Promethazine-Codeine) .Marland Kitchen.. 1 Tsp By Mouth Q 6hrs As Needed Cough 14)  Cephalexin 500 Mg Caps (Cephalexin) .Marland Kitchen.. 1 By Mouth Three Times A Day 15)  Omeprazole 20 Mg Cpdr (Omeprazole) .Marland Kitchen.. 1 By Mouth Two Times A Day  Current Medications (verified): 1)  Fluticasone Propionate 50 Mcg/act Susp (Fluticasone Propionate) .... Spray 2 Spray Into Both  Nostrils Once A Day 2)  Ms Contin 60 Mg  Tb12 (Morphine Sulfate) .Marland Kitchen.. 1 By Mouth Three Times A Day - To Fill December 19, 2009 3)  Cialis 20 Mg  Tabs (Tadalafil) .Marland Kitchen.. 1 By Mouth Once Daily Prn 4)   Ms Contin 15 Mg  Tb12 (Morphine Sulfate) .Marland Kitchen.. 1 By Mouth Three Times A Day - To Fill December 19, 2009 5)  Roxanol 20 Mg/ml Oral Soln (Morphine Sulfate) .Marland Kitchen.. 100 Cc - Use Asd - 1 - 2 Cc By Mouth Q 4 Hrs As Needed - To Fill December 19, 2009 6)  Cetirizine Hcl 10 Mg Tabs (Cetirizine Hcl) .Marland Kitchen.. 1 By Mouth Once Daily 7)  Omeprazole 20 Mg Cpdr (Omeprazole) .Marland Kitchen.. 1 By Mouth Once Daily 8)  Vesicare 10 Mg Tabs (Solifenacin Succinate) .Marland Kitchen.. 1 By Mouth Once Daily 9)  Neurontin 100 Mg Caps (Gabapentin) .... 1/2 By Mouth Three Times A Day 10)  Clonazepam 0.5 Mg Tabs (Clonazepam) .... 1/2 By Mouth Qhs 11)  Tramadol Hcl 50 Mg Tabs (Tramadol Hcl) .Marland Kitchen.. 1 By Mouth Q 6 Hrs As Needed 12)  Azithromycin 250 Mg Tabs (Azithromycin) .... 2po Qd For 1 Day, Then 1po Qd For 4days, Then Stop  Allergies (verified): 1)  ! * Flexeril 5 Mg 2)  ! 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 h/o pos PPD - s/p INH Allergic rhinitis Colonic polyps, hx of OSA Diabetes mellitus, type II - diet GERD knee DJD - end -stage  - Dr Shelle Iron  Review of Systems  The patient denies anorexia, fever, weight gain, vision loss, decreased hearing, hoarseness, chest pain, syncope, dyspnea on exertion, peripheral edema, prolonged cough, headaches, hemoptysis, abdominal pain, melena, hematochezia, severe indigestion/heartburn, hematuria, muscle weakness, suspicious skin lesions, transient blindness,  difficulty walking, unusual weight change, abnormal bleeding, enlarged lymph nodes, and angioedema.         all otherwise negative per pt -    Physical Exam  General:  alert and overweight-appearing.   Head:  normocephalic and atraumatic.   Eyes:  vision grossly intact, pupils equal, and pupils round.   Ears:  R ear normal and L ear normal.   Nose:  no external deformity and no nasal discharge.   Mouth:  no gingival abnormalities and pharynx pink and moist.   Neck:  supple and no masses.   Lungs:  normal respiratory effort and normal breath sounds.  Heart:  normal rate and regular rhythm.   Abdomen:  soft, non-tender, and normal bowel sounds.   Msk:  no joint tenderness and no joint swelling.  , has marked left knee crepitus Extremities:  no edema, no erythema  Neurologic:  cranial nerves II-XII intact and strength normal in all extremities.     Impression & Recommendations:  Problem # 1:  Preventive Health Care (ICD-V70.0)  Overall doing well, age appropriate education and counseling updated and referral for appropriate preventive services done unless declined, immunizations up to date or declined, diet counseling done if overweight, urged to quit smoking if smokes , most recent labs reviewed and current ordered if appropriate, ecg reviewed or declined (interpretation per ECG scanned in the EMR if done); information regarding Medicare Prevention requirements given if appropriate  Orders: EKG w/ Interpretation (93000)  Problem # 2:  SEBACEOUS CYST, INFECTED (ICD-706.2) right post neck - for zpack, declines surgury referral at this time  Problem # 3:  DIABETES MELLITUS, TYPE II (ICD-250.00)  Labs Reviewed: Creat: 0.9 (09/28/2009)    Reviewed HgBA1c results: 5.9 (09/30/2008)  6.0 (06/08/2008) stable overall by hx and exam, ok to continue meds/tx as is  - no meds needed at this time  Problem # 4:  CHRONIC PAIN SYNDROME (ICD-338.4) for med refills today  Complete Medication  List: 1)  Fluticasone Propionate 50 Mcg/act Susp (Fluticasone propionate) .... Spray 2 spray into both  nostrils once a day 2)  Ms Contin 60 Mg Tb12 (Morphine sulfate) .Marland Kitchen.. 1 by mouth three times a day - to fill December 19, 2009 3)  Cialis 20 Mg Tabs (Tadalafil) .Marland Kitchen.. 1 by mouth once daily prn 4)  Ms Contin 15 Mg Tb12 (Morphine sulfate) .Marland Kitchen.. 1 by mouth three times a day - to fill December 19, 2009 5)  Roxanol 20 Mg/ml Oral Soln (Morphine sulfate) .Marland Kitchen.. 100 cc - use asd - 1 - 2 cc by mouth q 4 hrs as needed - to fill December 19, 2009 6)  Cetirizine Hcl 10 Mg Tabs (Cetirizine hcl) .Marland Kitchen.. 1 by mouth once daily 7)  Omeprazole 20 Mg Cpdr (Omeprazole) .Marland Kitchen.. 1 by mouth once daily 8)  Vesicare 10 Mg Tabs (Solifenacin succinate) .Marland Kitchen.. 1 by mouth once daily 9)  Neurontin 100 Mg Caps (Gabapentin) .... 1/2 by mouth three times a day 10)  Clonazepam 0.5 Mg Tabs (Clonazepam) .... 1/2 by mouth qhs 11)  Tramadol Hcl 50 Mg Tabs (Tramadol hcl) .Marland Kitchen.. 1 by mouth q 6 hrs as needed 12)  Azithromycin 250 Mg Tabs (Azithromycin) .... 2po qd for 1 day, then 1po qd for 4days, then stop  Patient Instructions: 1)  Your EKG was good today 2)  Please take all new medications as prescribed - the antibiotic (sent to [facility]) 3)  You are otherwise uptodate today 4)  Please schedule a follow-up appointment in 6 months with: 5)  BMP prior to visit, ICD-9:250.02 6)  Lipid Panel prior to visit, ICD-9: 7)  HbgA1C prior to visit, ICD-9: Prescriptions: ROXANOL 20 MG/ML ORAL SOLN (MORPHINE SULFATE) 100 cc - use asd - 1 - 2 cc by mouth q 4 hrs as needed - to fill December 19, 2009  #100cc x 0   Entered and Authorized by:   Corwin Levins MD   Signed by:   Corwin Levins MD on 10/06/2009   Method used:   Print then Give to Patient   RxID:   [id number] ROXANOL 20 MG/ML ORAL SOLN (MORPHINE SULFATE) 100  cc - use asd - 1 - 2 cc by mouth q 4 hrs as needed - to fill November 19, 2009  #100cc x 0   Entered and Authorized by:   Corwin Levins MD    Signed by:   Corwin Levins MD on 10/06/2009   Method used:   Print then Give to Patient   RxID:   [id number] ROXANOL 20 MG/ML ORAL SOLN (MORPHINE SULFATE) 100 cc - use asd - 1 - 2 cc by mouth q 4 hrs as needed - to fill Oct 20, 2009  #100cc x 0   Entered and Authorized by:   Corwin Levins MD   Signed by:   Corwin Levins MD on 10/06/2009   Method used:   Print then Give to Patient   RxID:   [id number] ROXANOL 20 MG/ML ORAL SOLN (MORPHINE SULFATE) 100 cc - use asd - 1 - 2 cc by mouth q 4 hrs as needed - to fill Oct 19, 2009  #100cc x 0   Entered and Authorized by:   Corwin Levins MD   Signed by:   Corwin Levins MD on 10/06/2009   Method used:   Print then Give to Patient   RxID:   [id number] MS CONTIN 15 MG  TB12 (MORPHINE SULFATE) 1 by mouth three times a day - to fill December 19, 2009  #90 x 0   Entered and Authorized by:   Corwin Levins MD   Signed by:   Corwin Levins MD on 10/06/2009   Method used:   Print then Give to Patient   RxID:   [id number] MS CONTIN 15 MG  TB12 (MORPHINE SULFATE) 1 by mouth three times a day - to fill November 19, 2009  #90 x 0   Entered and Authorized by:   Corwin Levins MD   Signed by:   Corwin Levins MD on 10/06/2009   Method used:   Print then Give to Patient   RxID:   [id number] MS CONTIN 15 MG  TB12 (MORPHINE SULFATE) 1 by mouth three times a day - to fill Oct 20, 2009  #90 x 0   Entered and Authorized by:   Corwin Levins MD   Signed by:   Corwin Levins MD on 10/06/2009   Method used:   Print then Give to Patient   RxID:   [id number] MS CONTIN 60 MG  TB12 (MORPHINE SULFATE) 1 by mouth three times a day - to fill December 19, 2009  #90 x 0   Entered and Authorized by:   Corwin Levins MD   Signed by:   Corwin Levins MD on 10/06/2009   Method used:   Print then Give to Patient   RxID:   [id number] MS CONTIN 60 MG  TB12 (MORPHINE SULFATE) 1 by mouth three times a day - to fill November 19, 2009  #90 x 0   Entered and Authorized by:   Corwin Levins MD   Signed by:   Corwin Levins MD on 10/06/2009   Method used:   Print then Give to Patient   RxID:   [id number] MS CONTIN 60 MG  TB12 (MORPHINE SULFATE) 1 by mouth three times a day - to fill Oct 20, 2009  #90 x 0   Entered and Authorized by:   Corwin Levins MD   Signed by:   Corwin Levins MD on 10/06/2009  Method used:   Print then Give to Patient   RxID:   [id number] OMEPRAZOLE 20 MG CPDR (OMEPRAZOLE) 1 by mouth once daily  #30 x 11   Entered and Authorized by:   Corwin Levins MD   Signed by:   Corwin Levins MD on 10/06/2009   Method used:   Print then Give to Patient   RxID:   [id number] CETIRIZINE HCL 10 MG TABS (CETIRIZINE HCL) 1 by mouth once daily  #30 x 11   Entered and Authorized by:   Corwin Levins MD   Signed by:   Corwin Levins MD on 10/06/2009   Method used:   Print then Give to Patient   RxID:   [id number] FLUTICASONE PROPIONATE 50 MCG/ACT SUSP (FLUTICASONE PROPIONATE) Spray 2 spray into both  nostrils once a day  #1 x 11   Entered and Authorized by:   Corwin Levins MD   Signed by:   Corwin Levins MD on 10/06/2009   Method used:   Print then Give to Patient   RxID:   [id number] AZITHROMYCIN 250 MG TABS (AZITHROMYCIN) 2po qd for 1 day, then 1po qd for 4days, then stop  #6 x 1   Entered and Authorized by:   Corwin Levins MD   Signed by:   Corwin Levins MD on 10/06/2009   Method used:   Electronically to        [facility]* (retail)       [address]       [place], [place]  [address]       Ph: [phone]       Fax: [phone]   RxID:   [id number]

## 2010-07-05 NOTE — Progress Notes (Signed)
Summary: School note/ JWJ pt  Phone Note Call from Patient Call back at [facility] Phone [phone]   Caller: Patient Summary of Call: pt called stating that he is not much improved from last OV. Pt says that he only has 3 classes this week and is requesting to have school note extended to next Monday 02/28.2011. please advise Initial call taken by: Margaret Pyle, CMA,  July 26, 2009 11:53 AM  Follow-up for Phone Call        ok - i will sign - if worse symptoms or unable to return to school next week - needs OV to re-eval Follow-up by: Newt Lukes MD,  July 26, 2009 12:28 PM  Additional Follow-up for Phone Call Additional follow up Details #1::        pt informed, note mailed per pt request Additional Follow-up by: Margaret Pyle, CMA,  July 26, 2009 1:22 PM

## 2010-07-05 NOTE — Letter (Signed)
Summary: Out of [facility]  [address] [place], [place] [address]   Phone: [phone]  Fax: [phone]    April 19, 2010   Student:  Rebecca Eaton    To Whom It May Concern:   For Medical reasons, please excuse the above named student from school for the following dates:  Start:   April 19, 2010  End:    Apr 19, 2010  If you need additional information, please feel free to contact our office.   Sincerely,    Corwin Levins MD    ****This is a legal document and cannot be tampered with.  Schools are authorized to verify all information and to do so accordingly.

## 2010-07-05 NOTE — Progress Notes (Signed)
Summary: medication refill  Phone Note Refill Request Message from:  Fax from Pharmacy on February 01, 2010 8:13 AM  Refills Requested: Medication #1:  TRAMADOL HCL 50 MG TABS 1 by mouth q 6 hrs as needed.   Dosage confirmed as above?Dosage Confirmed   Notes: [facility] [phone] Initial call taken by: Zella Ball Ewing CMA Duncan Dull),  February 01, 2010 8:14 AM  Follow-up for Phone Call        done escript Follow-up by: Corwin Levins MD,  February 01, 2010 4:40 PM    New/Updated Medications: TRAMADOL HCL 50 MG TABS (TRAMADOL HCL) 1 by mouth q 6 hrs as needed Prescriptions: TRAMADOL HCL 50 MG TABS (TRAMADOL HCL) 1 by mouth q 6 hrs as needed  #120 x 3   Entered and Authorized by:   Corwin Levins MD   Signed by:   Corwin Levins MD on 02/01/2010   Method used:   Electronically to        [facility]* (retail)       [address]       [place], [place]  [address]       Ph: [phone]       Fax: [phone]   RxID:   [id number]

## 2010-07-05 NOTE — Assessment & Plan Note (Signed)
Summary: HIP PROBLEM/ MED QUESTIONS/NWS  #   Vital Signs:  Patient profile:   55 year old male Height:      70 inches Weight:      228.25 pounds BMI:     32.87 O2 Sat:      93 % on Room air Temp:     98.7 degrees F oral Pulse rate:   83 / minute BP sitting:   112 / 82  (left arm) Cuff size:   large  Vitals Entered By: Zella Ball Ewing CMA Duncan Dull) (April 19, 2010 2:36 PM)  O2 Flow:  Room air CC: left hip pain, discuss medications/RE   CC:  left hip pain and discuss medications/RE.  History of Present Illness: here to f/u -  lyrica not auth per insurance so not taking ;  pt with ongoing chronic pain adn also with mild to mod  depressive symptoms as well as the pain and requests cymbalta instead'  Denies worsening  suicidal ideation, or panic.  Pt denies CP, worsening sob, doe, wheezing, orthopnea, pnd, worsening LE edema, palps, dizziness or syncope  Pt denies new neuro symptoms such as headache, facial or extremity weakness  Pt denies polydipsia, polyuria. Overall good compliance with meds, trying to follow low chol diet, wt stable, little excercise however Still with ongoing ED symtpoms  -  also requests cialis vs levitra.  Pain currently worse to the left hip and knee and has appt with Dr Shelle Iron later today to assess. Overall good compliance with meds, and good tolerability.  No fever, wt loss, night sweats, loss of appetite or other constitutional symptoms   Problems Prior to Update: 1)  Otitis Media, Acute  (ICD-382.9) 2)  Groin Pain  (ICD-789.09) 3)  Chronic Pain Syndrome  (ICD-338.4) 4)  Osteoarthritis, Knee, Left  (ICD-715.96) 5)  Sebaceous Cyst, Infected  (ICD-706.2) 6)  Gerd  (ICD-530.81) 7)  Rash-nonvesicular  (ICD-782.1) 8)  Anal Pruritus  (ICD-698.0) 9)  Skin Lesion  (ICD-709.9) 10)  Balanitis  (ICD-607.1) 11)  Diabetes Mellitus, Type II  (ICD-250.00) 12)  Chest Pain  (ICD-786.50) 13)  Otitis Media, Serous, Acute, Right  (ICD-381.01) 14)  Rash-nonvesicular   (ICD-782.1) 15)  Obstructive Sleep Apnea  (ICD-327.23) 16)  Colonic Polyps, Hx of  (ICD-V12.72) 17)  Contact With or Exposure To Venereal Diseases  (ICD-V01.6) 18)  Preventive Health Care  (ICD-V70.0) 19)  Sinusitis- Acute-nos  (ICD-461.9) 20)  Anxiety  (ICD-300.00) 21)  Urinary Incontinence, Male  (ICD-788.30) 22)  Foot Pain, Left  (ICD-729.5) 23)  Chest Pain  (ICD-786.50) 24)  Paresthesia  (ICD-782.0) 25)  Knee Pain, Left  (ICD-719.46) 26)  Family History of Alcoholism/addiction  (ICD-V61.41) 27)  Arthroscopy, Knee, Hx of  (ICD-V45.89) 28)  Glucose Intolerance, Minimal, Hx of  (ICD-V12.2) 29)  Allergic Rhinitis  (ICD-477.9) 30)  Disc Disease, Cervical  (ICD-722.4) 31)  Positive Ppd  (ICD-795.5) 32)  Erectile Dysfunction  (ICD-302.72) 33)  Hyperlipidemia  (ICD-272.4) 34)  Post Traumatic Stress Syndrome  (ICD-309.81) 35)  Needle Biopsy, Liver, Hx of  (ICD-V15.89) 36)  Low Back Pain, Chronic  (ICD-724.2) 37)  Hepatitis C  (ICD-070.51) 38)  Depression  (ICD-311)  Medications Prior to Update: 1)  Fluticasone Propionate 50 Mcg/act Susp (Fluticasone Propionate) .... Spray 2 Spray Into Both  Nostrils Once A Day 2)  Cialis 20 Mg  Tabs (Tadalafil) .Marland Kitchen.. 1 By Mouth Once Daily Prn 3)  Roxanol 20 Mg/ml Oral Soln (Morphine Sulfate) .Marland Kitchen.. 100 Cc - Use Asd - 1 - 2 Cc  By Mouth Q 4 Hrs As Needed - To Fill Apr 18, 2010 4)  Omeprazole 20 Mg Cpdr (Omeprazole) .Marland Kitchen.. 1 By Mouth Two Times A Day 5)  Vesicare 10 Mg Tabs (Solifenacin Succinate) .Marland Kitchen.. 1 By Mouth Once Daily 6)  Lyrica 50 Mg Caps (Pregabalin) .Marland Kitchen.. 1po Two Times A Day 7)  Clonazepam 0.5 Mg Tabs (Clonazepam) .... 1/2 By Mouth Qhs 8)  Butrans 10 Mcg/hr Ptwk (Buprenorphine) .Marland Kitchen.. 1 Patch Asd Per Wk - To Fill Apr 08, 2010  Current Medications (verified): 1)  Fluticasone Propionate 50 Mcg/act Susp (Fluticasone Propionate) .... Spray 2 Spray Into Both  Nostrils Once A Day 2)  Cialis 5 Mg Tabs (Tadalafil) .Marland Kitchen.. 1po Once Daily 3)  Roxanol 20 Mg/ml Oral  Soln (Morphine Sulfate) .Marland Kitchen.. 100 Cc - Use Asd - 1 - 2 Cc By Mouth Q 4 Hrs As Needed - To Fill Apr 18, 2010 4)  Omeprazole 20 Mg Cpdr (Omeprazole) .Marland Kitchen.. 1 By Mouth Two Times A Day 5)  Vesicare 10 Mg Tabs (Solifenacin Succinate) .Marland Kitchen.. 1 By Mouth Once Daily 6)  Cymbalta 60 Mg Cpep (Duloxetine Hcl) .Marland Kitchen.. 1 By Mouth Once Daily 7)  Clonazepam 0.5 Mg Tabs (Clonazepam) .... 1/2 By Mouth Qhs 8)  Butrans 10 Mcg/hr Ptwk (Buprenorphine) .Marland Kitchen.. 1 Patch Asd Per Wk - To Fill Apr 08, 2010 9)  Levitra 20 Mg Tabs (Vardenafil Hcl) .Marland Kitchen.. 1po Every Other Day As Needed  Allergies (verified): 1)  ! * Flexeril 5 Mg 2)  ! 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 h/o pos PPD - s/p INH Allergic rhinitis Colonic polyps, hx of OSA Diabetes mellitus, type II - diet GERD knee DJD - end -stage  - Dr Shelle Iron  Review of Systems       all otherwise negative per pt -    Physical Exam  General:  alert and overweight-appearing.   Head:  normocephalic and atraumatic.   Eyes:  vision grossly intact, pupils equal, and pupils round.   Ears:  R ear normal and L ear normal.   Nose:  no external deformity and no nasal discharge.   Mouth:  no gingival abnormalities and pharynx pink and moist.   Neck:  supple and no masses.   Lungs:  normal respiratory effort and normal breath sounds.   Heart:  normal rate and regular rhythm.   Abdomen:  soft, non-tender, and normal bowel sounds.   Msk:  no joint tenderness and no joint swelling.  , has marked left knee crepitus; no spine tender or  paravertebral tender today; does have significant pain on left hip forward flexion and int/ext rotation Extremities:  no edema, no erythema  Neurologic:  cranial nerves II-XII intact and strength normal in all extremities.     Impression & Recommendations:  Problem # 1:  PAIN IN JOINT PELVIC REGION AND THIGH (ICD-719.45)  His updated medication list for this problem includes:    Roxanol 20 Mg/ml Oral Soln (Morphine sulfate) .Marland KitchenMarland KitchenMarland KitchenMarland Kitchen 100 cc - use asd - 1 - 2 cc by mouth q 4 hrs as needed - to fill Apr 18, 2010    Butrans 10  Mcg/hr Ptwk (Buprenorphine) .Marland Kitchen... 1 patch asd per wk - to fill Apr 08, 2010 treat as above, f/u any worsening signs or symptoms  - ? left hip DJD vs soft tissue, vs lumbar related; has appt later today to f/ as well  Problem # 2:  CHRONIC PAIN SYNDROME (ICD-338.4) for the butrans above, and add the cymbalta as well   Problem # 3:  OSTEOARTHRITIS, KNEE, LEFT (ICD-715.96)  His updated medication list for this problem includes:    Roxanol 20 Mg/ml Oral Soln (Morphine sulfate) .Marland KitchenMarland KitchenMarland KitchenMarland Kitchen 100 cc - use asd - 1 - 2 cc by mouth q 4 hrs as needed - to fill Apr 18, 2010    Butrans 10 Mcg/hr Ptwk (Buprenorphine) .Marland Kitchen... 1 patch asd per wk - to fill Apr 08, 2010 treat as above, f/u any worsening signs or symptoms , also for f/u ortho later today  Problem # 4:  ERECTILE DYSFUNCTION (ICD-302.72)  His updated medication list for this problem includes:    Cialis 5 Mg Tabs (Tadalafil) .Marland Kitchen... 1po once daily    Levitra 20 Mg Tabs (Vardenafil hcl) .Marland Kitchen... 1po every other day as needed treat as above, f/u any worsening signs or symptoms   Problem # 5:  DIABETES MELLITUS, TYPE II (ICD-250.00)  Labs Reviewed: Creat: 1.2 (04/05/2010)    Reviewed HgBA1c results: 5.8 (04/05/2010)  5.9 (09/30/2008) stable overall by hx and exam, ok to continue meds/tx as is - no OHA needed at this itme  Problem # 6:  DEPRESSION (ICD-311)  His updated medication list for this problem includes:    Cymbalta 60 Mg  Cpep (Duloxetine hcl) .Marland Kitchen... 1 by mouth once daily    Clonazepam 0.5 Mg Tabs (Clonazepam) .Marland Kitchen... 1/2 by mouth qhs treat as above, f/u any worsening signs or symptoms   Complete Medication List: 1)  Fluticasone Propionate 50 Mcg/act Susp (Fluticasone propionate) .... Spray 2 spray into both  nostrils once a day 2)  Cialis 5 Mg Tabs (Tadalafil) .Marland Kitchen.. 1po once daily 3)  Roxanol 20 Mg/ml Oral Soln (Morphine sulfate) .Marland Kitchen.. 100 cc - use asd - 1 - 2 cc by mouth q 4 hrs as needed - to fill Apr 18, 2010 4)  Omeprazole 20 Mg Cpdr (Omeprazole) .Marland Kitchen.. 1 by mouth two times a day 5)  Vesicare 10 Mg Tabs (Solifenacin succinate) .Marland Kitchen.. 1 by mouth once daily 6)  Cymbalta 60 Mg Cpep (Duloxetine hcl) .Marland Kitchen.. 1 by mouth once daily 7)  Clonazepam 0.5 Mg Tabs (Clonazepam) .... 1/2 by mouth qhs 8)  Butrans 10 Mcg/hr Ptwk (Buprenorphine) .Marland Kitchen.. 1 patch asd per wk - to fill Apr 08, 2010 9)  Levitra 20 Mg Tabs (Vardenafil hcl) .Marland Kitchen.. 1po every other day as needed 10)  Azithromycin 500 Mg Tabs (Azithromycin) .Marland Kitchen.. 1 tab once daily  Patient Instructions: 1)  Please take all new medications as prescribed  - the cymbalta is 30 mg per day for one wk, then 60 mg per day after that 2)  you are given the prescriptions for levitra and the cialis to consider which is cheaper , but dont use both in the same day 3)  stop the lyrica 4)  Continue all previous medications as before this visit  5)  Please keep your appt with Dr Shelle Iron Gaylord Shih as planned later today for the left hip and knee 6)  you are given the note for school today 7)  Please schedule a follow-up appointment as needed. Prescriptions: CIALIS 5 MG TABS (TADALAFIL) 1po once daily  #30  x 11   Entered and Authorized by:   Corwin Levins MD   Signed by:   Corwin Levins MD on 04/19/2010   Method used:   Print then Give to Patient   RxID:   [id number] CYMBALTA 60 MG CPEP (DULOXETINE HCL) 1 by mouth once daily  #30 x 11   Entered and Authorized by:   Corwin Levins MD   Signed by:    Corwin Levins MD on 04/19/2010   Method used:   Print then Give to Patient   RxID:   [id number] LEVITRA 20 MG TABS (VARDENAFIL HCL) 1po every other day as needed  #5 x 11   Entered and Authorized by:   Corwin Levins MD   Signed by:   Corwin Levins MD on 04/19/2010   Method used:   Print then Give to Patient   RxID:   [id number] CIALIS 5 MG TABS (TADALAFIL) 1po once daily  #30 x 11   Entered and Authorized by:   Corwin Levins MD   Signed by:   Corwin Levins MD on 04/19/2010   Method used:   Print then Give to Patient   RxID:   [id number] CYMBALTA 60 MG CPEP (DULOXETINE HCL) 1 by mouth once daily  #90 x 3   Entered and Authorized by:   Corwin Levins MD   Signed by:   Corwin Levins MD on 04/19/2010   Method used:   Print then Give to Patient   RxID:   [id number]    Orders Added: 1)  Est. Patient Level IV [66063]

## 2010-07-05 NOTE — Assessment & Plan Note (Signed)
Summary: 6 month follow up-lb   Vital Signs:  Patient profile:   55 year old male Height:      70 inches Weight:      228 pounds BMI:     32.83 O2 Sat:      96 % on Room air Temp:     98.7 degrees F oral Pulse rate:   71 / minute BP sitting:   110 / 72  (left arm) Cuff size:   large  Vitals Entered By: Zella Ball Ewing CMA (AAMA) (April 08, 2010 1:32 PM)  O2 Flow:  Room air CC: 6 month followup/RE   CC:  6 month followup/RE.  History of Present Illness: here for f/u; lost 7 lbs with bette diet and excercise  with walk and outside bike every other day; Pt denies CP, worsening sob, doe, wheezing, orthopnea, pnd, worsening LE edema, palps, dizziness or syncope Pt denies new neuro symptoms such as headache, facial or extremity weakness Pt denies polydipsia, polyuria, or low sugar symptoms such as shakiness improved with eating.  Overall good compliance with meds, trying to follow low chol, DM diet.  Denies worsening depressive symptoms, suicidal ideation, or panic.  No fever, wt loss, night sweats, loss of appetite or other constitutional symptoms  Is having left knee and back pain recurrent that is chronic fo rhim, worse wtih more excericse lately;  sometimes feels as if the hip and leg moight give away, worse to try to get up from sitting.  Also some left lower back pain adn some radiation to the distal LLE , also with occasinal cramping that wakes him up at night to the feet and toes.  No cludiaiton smtpoms.  Pain rose to the left leg with twisting and bending seem to make the left back worse.  No bolwe or bladder changes,, fever,.  Often has to put wt on the right leg to get up form the floor, such cleaning the kitchen floor on hands and knees to avoid pushing the mop Also hard to get up from sitting on the commode.   Tramadol just not working now, needs a pain med that wont cuase sedation a s he is still tyring to go to class and has been holding off on the ms contin lately.  Has not recetnly  seen Dr Braulio Bosch for the back, or Dr Shelle Iron Gaylord Shih for the knee ;  pt wants to see Dr Shelle Iron first since hewas told last yr he will need knee replacement.   Problems Prior to Update: 1)  Groin Pain  (ICD-789.09) 2)  Chronic Pain Syndrome  (ICD-338.4) 3)  Osteoarthritis, Knee, Left  (ICD-715.96) 4)  Sebaceous Cyst, Infected  (ICD-706.2) 5)  Gerd  (ICD-530.81) 6)  Rash-nonvesicular  (ICD-782.1) 7)  Anal Pruritus  (ICD-698.0) 8)  Skin Lesion  (ICD-709.9) 9)  Balanitis  (ICD-607.1) 10)  Diabetes Mellitus, Type II  (ICD-250.00) 11)  Chest Pain  (ICD-786.50) 12)  Otitis Media, Serous, Acute, Right  (ICD-381.01) 13)  Rash-nonvesicular  (ICD-782.1) 14)  Obstructive Sleep Apnea  (ICD-327.23) 15)  Colonic Polyps, Hx of  (ICD-V12.72) 16)  Contact With or Exposure To Venereal Diseases  (ICD-V01.6) 17)  Preventive Health Care  (ICD-V70.0) 18)  Sinusitis- Acute-nos  (ICD-461.9) 19)  Anxiety  (ICD-300.00) 20)  Urinary Incontinence, Male  (ICD-788.30) 21)  Foot Pain, Left  (ICD-729.5) 22)  Chest Pain  (ICD-786.50) 23)  Paresthesia  (ICD-782.0) 24)  Knee Pain, Left  (ICD-719.46) 25)  Family History of Alcoholism/addiction  (ICD-V61.41) 26)  Arthroscopy, Knee, Hx of  (ICD-V45.89) 27)  Glucose Intolerance, Minimal, Hx of  (ICD-V12.2) 28)  Allergic Rhinitis  (ICD-477.9) 29)  Disc Disease, Cervical  (ICD-722.4) 30)  Positive Ppd  (ICD-795.5) 31)  Erectile Dysfunction  (ICD-302.72) 32)  Hyperlipidemia  (ICD-272.4) 33)  Post Traumatic Stress Syndrome  (ICD-309.81) 34)  Needle Biopsy, Liver, Hx of  (ICD-V15.89) 35)  Low Back Pain, Chronic  (ICD-724.2) 36)  Hepatitis C  (ICD-070.51) 37)  Depression  (ICD-311)  Medications Prior to Update: 1)  Fluticasone Propionate 50 Mcg/act Susp (Fluticasone Propionate) .... Spray 2 Spray Into Both  Nostrils Once A Day 2)  Ms Contin 60 Mg  Tb12 (Morphine Sulfate) .Marland Kitchen.. 1 By Mouth Three Times A Day - To Fill Mar 19, 2010 3)  Cialis 20 Mg  Tabs (Tadalafil) .Marland Kitchen.. 1  By Mouth Once Daily Prn 4)  Ms Contin 15 Mg  Tb12 (Morphine Sulfate) .Marland Kitchen.. 1 By Mouth Three Times A Day - To Fill Mar 19, 2010 5)  Roxanol 20 Mg/ml Oral Soln (Morphine Sulfate) .Marland Kitchen.. 100 Cc - Use Asd - 1 - 2 Cc By Mouth Q 4 Hrs As Needed - To Fill Mar 19, 2010 6)  Cetirizine Hcl 10 Mg Tabs (Cetirizine Hcl) .Marland Kitchen.. 1 By Mouth Once Daily 7)  Omeprazole 20 Mg Cpdr (Omeprazole) .Marland Kitchen.. 1 By Mouth Two Times A Day 8)  Vesicare 10 Mg Tabs (Solifenacin Succinate) .Marland Kitchen.. 1 By Mouth Once Daily 9)  Neurontin 100 Mg Caps (Gabapentin) .... 1/2 By Mouth Three Times A Day 10)  Clonazepam 0.5 Mg Tabs (Clonazepam) .... 1/2 By Mouth Qhs 11)  Tramadol Hcl 50 Mg Tabs (Tramadol Hcl) .Marland Kitchen.. 1 By Mouth Q 6 Hrs As Needed  Current Medications (verified): 1)  Fluticasone Propionate 50 Mcg/act Susp (Fluticasone Propionate) .... Spray 2 Spray Into Both  Nostrils Once A Day 2)  Cialis 20 Mg  Tabs (Tadalafil) .Marland Kitchen.. 1 By Mouth Once Daily Prn 3)  Roxanol 20 Mg/ml Oral Soln (Morphine Sulfate) .Marland Kitchen.. 100 Cc - Use Asd - 1 - 2 Cc By Mouth Q 4 Hrs As Needed - To Fill Apr 18, 2010 4)  Omeprazole 20 Mg Cpdr (Omeprazole) .Marland Kitchen.. 1 By Mouth Two Times A Day 5)  Vesicare 10 Mg Tabs (Solifenacin Succinate) .Marland Kitchen.. 1 By Mouth Once Daily 6)  Lyrica 50 Mg Caps (Pregabalin) .Marland Kitchen.. 1po Two Times A Day 7)  Clonazepam 0.5 Mg Tabs (Clonazepam) .... 1/2 By Mouth Qhs 8)  Butrans 10 Mcg/hr Ptwk (Buprenorphine) .Marland Kitchen.. 1 Patch Asd Per Wk - To Fill Apr 08, 2010  Allergies (verified): 1)  ! * Flexeril 5 Mg 2)  ! * Latex  Past History:  Past Medical History: Last updated: 10/06/2009 Depression Hepatitis C PTSD chronic pain syndrome - cervical/lumbar Hyperlipidemia E.D. 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   Head:  normocephalic and atraumatic.   Eyes:  vision grossly intact, pupils equal, and pupils round.  Ears:  R ear normal and L ear normal.   Nose:  no external deformity and no nasal discharge.   Mouth:  no gingival abnormalities and pharynx pink and moist.   Neck:  supple and no masses.   Lungs:  normal respiratory effort and normal breath sounds.   Heart:  normal rate and regular rhythm.   Abdomen:  soft, non-tender, and normal bowel sounds.   Msk:  no joint tenderness and no joint swelling.  , has marked left knee crepitus; no spine tender or paravertebral tender today; does have significant pain on left hip forward flexion and int/ext rotaion c/w evolving newer pain he describes today Extremities:  no edema, no erythema  Neurologic:  cranial nerves II-XII intact and strength normal in all extremities.     Impression & Recommendations:  Problem # 1:  CHRONIC PAIN SYNDROME (ICD-338.4)  pt trying to avoid ms contin during the day due to oversedation ,  will d/c ms contin 60 and 30;  change to butrans 10/hr, cont the roxanol as needed (most likely at night for sleep hours), and add lyrica 50 two times a day  Problem # 2:  OSTEOARTHRITIS, KNEE, LEFT (ICD-715.96)  The following medications were removed from the medication list:    Ms Contin 60 Mg Tb12 (Morphine sulfate) .Marland Kitchen... 1 by mouth three times a day - to fill Mar 19, 2010    Ms Contin 15 Mg Tb12 (Morphine sulfate) .Marland Kitchen... 1 by mouth three times a day - to fill Mar 19, 2010    Tramadol Hcl 50 Mg Tabs (Tramadol hcl) .Marland Kitchen... 1 by mouth q 6 hrs as needed His updated medication list for this problem includes:    Roxanol  20 Mg/ml Oral Soln (Morphine sulfate) .Marland KitchenMarland KitchenMarland KitchenMarland Kitchen 100 cc - use asd - 1 - 2 cc by mouth q 4 hrs as needed - to fill Apr 18, 2010    Butrans 10 Mcg/hr Ptwk (Buprenorphine) .Marland Kitchen... 1 patch asd per wk - to fill Apr 08, 2010 severe, end stage, for pain meds as above, refer to dr Shelle Iron for f/u  Orders: Orthopedic Surgeon Referral (Ortho Surgeon)  Problem # 3:  GROIN PAIN (ICD-789.09)  The following medications were removed from the medication list:    Ms Contin 60 Mg Tb12 (Morphine sulfate) .Marland Kitchen... 1 by mouth three times a day - to fill Mar 19, 2010    Ms Contin 15 Mg Tb12 (Morphine sulfate) .Marland Kitchen... 1 by mouth three times a day - to fill Mar 19, 2010    Tramadol Hcl 50 Mg Tabs (Tramadol hcl) .Marland Kitchen... 1 by mouth q 6 hrs as needed His updated medication list for this problem includes:    Roxanol 20 Mg/ml Oral Soln (Morphine sulfate) .Marland KitchenMarland KitchenMarland KitchenMarland Kitchen 100 cc - use asd - 1 - 2 cc by mouth q 4 hrs as needed - to fill Apr 18, 2010    Butrans 10 Mcg/hr Ptwk (Buprenorphine) .Marland Kitchen... 1 patch asd per wk - to fill Apr 08, 2010 left, c/w prob DJD left hip,  for pain meds, and ortho eval if not controlled  Problem # 4:  DIABETES MELLITUS, TYPE II (ICD-250.00)  Labs Reviewed: Creat: 1.2 (04/05/2010)    Reviewed HgBA1c results: 5.8 (04/05/2010)  5.9 (09/30/2008) stable overall by hx and exam, ok to continue meds/tx as is, Pt to cont DM diet, excercise, wt control efforts  Complete Medication List: 1)  Fluticasone Propionate 50 Mcg/act Susp (Fluticasone propionate) .... Spray 2 spray into both  nostrils once a  day 2)  Cialis 20 Mg Tabs (Tadalafil) .Marland Kitchen.. 1 by mouth once daily prn 3)  Roxanol 20 Mg/ml Oral Soln (Morphine sulfate) .Marland Kitchen.. 100 cc - use asd - 1 - 2 cc by mouth q 4 hrs as needed - to fill Apr 18, 2010 4)  Omeprazole 20 Mg Cpdr (Omeprazole) .Marland Kitchen.. 1 by mouth two times a day 5)  Vesicare 10 Mg Tabs (Solifenacin succinate) .Marland Kitchen.. 1 by mouth once daily 6)  Lyrica 50 Mg Caps (Pregabalin) .Marland Kitchen.. 1po two times a day 7)  Clonazepam 0.5 Mg Tabs  (Clonazepam) .... 1/2 by mouth qhs 8)  Butrans 10 Mcg/hr Ptwk (Buprenorphine) .Marland Kitchen.. 1 patch asd per wk - to fill Apr 08, 2010  Patient Instructions: 1)  stop all ms contin 2)  start the butrans 10 patch   - 1 per wk 3)  start the lyrica 50 mg two times a day  4)  Continue all previous medications as before this visit  5)  Please schedule a follow-up appointment in 6 months for CPX labs and : 6)  HbgA1C prior to visit, ICD-9: 250.02 7)  Urine Microalbumin prior to visit, ICD-9: 8)  You will be contacted about the referral(s) to: ortho Prescriptions: BUTRANS 10 MCG/HR PTWK (BUPRENORPHINE) 1 patch asd per wk - to fill Apr 08, 2010  #4 x 0   Entered and Authorized by:   Corwin Levins MD   Signed by:   Corwin Levins MD on 04/08/2010   Method used:   Print then Give to Patient   RxID:   [id number] ROXANOL 20 MG/ML ORAL SOLN (MORPHINE SULFATE) 100 cc - use asd - 1 - 2 cc by mouth q 4 hrs as needed - to fill Apr 18, 2010  #100cc x 0   Entered and Authorized by:   Corwin Levins MD   Signed by:   Corwin Levins MD on 04/08/2010   Method used:   Print then Give to Patient   RxID:   [id number] LYRICA 50 MG CAPS (PREGABALIN) 1po two times a day  #180 x 1   Entered and Authorized by:   Corwin Levins MD   Signed by:   Corwin Levins MD on 04/08/2010   Method used:   Print then Give to Patient   RxID:   [id number]    Orders Added: 1)  Orthopedic Surgeon Referral Gaylord Shih Surgeon] 2)  Est. Patient Level IV [01027]

## 2010-07-05 NOTE — Letter (Signed)
Summary: Out of [facility]  [address] [place], [place] [address]   Phone: [phone]  Fax: [phone]    July 22, 2009   Student:  Rebecca Eaton    To Whom It May Concern:   For Medical reasons, please excuse the above named student from school for the following dates:  Start:   July 21, 2009  End:    July 25, 2009   -   to return Jul 26, 2009  If you need additional information, please feel free to contact our office.   Sincerely,    Corwin Levins MD    ****This is a legal document and cannot be tampered with.  Schools are authorized to verify all information and to do so accordingly.

## 2010-07-05 NOTE — Letter (Signed)
Summary: Out of [facility]  [address] [place], [place] [address]   Phone: [phone]  Fax: [phone]    July 26, 2009   Student:  Rebecca Eaton    To Whom It May Concern:   For Medical reasons, please excuse the above named student from school for the following dates:  Start:   July 21, 2009  End:    July 30, 2009 -- To Return to [facility] Monday Feburary 28th 2011  If you need additional information, please feel free to contact our office.   Sincerely,    Margaret Pyle, CMA / Rene Paci MD    ****This is a legal document and cannot be tampered with.  Schools are authorized to verify all information and to do so accordingly.

## 2010-07-05 NOTE — Progress Notes (Signed)
Summary: Rx refill req  Phone Note Refill Request Message from:  Patient on January 03, 2010 4:21 PM  Refills Requested: Medication #1:  MS CONTIN 60 MG  TB12 1 by mouth three times a day - to fill july 17   Dosage confirmed as above?Dosage Confirmed   Supply Requested: 3 months  Medication #2:  MS CONTIN 15 MG  TB12 1 by mouth three times a day - to fill july 17   Dosage confirmed as above?Dosage Confirmed   Supply Requested: 3 months  Medication #3:  ROXANOL 20 MG/ML ORAL SOLN 100 cc - use asd - 1 - 2 cc by mouth q 4 hrs as needed - to fill july 17   Dosage confirmed as above?Dosage Confirmed   Supply Requested: 3 months Pt is aware that Rx will not Rx to fill now. Pt filled july Rxs 1 week ago.   Method Requested: Pick up at Office Initial call taken by: Margaret Pyle, CMA,  January 03, 2010 4:24 PM  Follow-up for Phone Call        all done hardcopy to LIM side B - dahlia  Follow-up by: Corwin Levins MD,  January 03, 2010 5:43 PM  Additional Follow-up for Phone Call Additional follow up Details #1::        Pt informed, Rx in cabinet for pt pick up Additional Follow-up by: Margaret Pyle, CMA,  January 04, 2010 8:20 AM    New/Updated Medications: MS CONTIN 60 MG  TB12 (MORPHINE SULFATE) 1 by mouth three times a day - to fill Jan 18, 2010 MS CONTIN 60 MG  TB12 (MORPHINE SULFATE) 1 by mouth three times a day - to fill sept 15, 2011 MS CONTIN 60 MG  TB12 (MORPHINE SULFATE) 1 by mouth three times a day - to fill Mar 19, 2010 MS CONTIN 15 MG  TB12 (MORPHINE SULFATE) 1 by mouth three times a day - to fill Jan 18, 2010 MS CONTIN 15 MG  TB12 (MORPHINE SULFATE) 1 by mouth three times a day - to fill sept 15, 2011 MS CONTIN 15 MG  TB12 (MORPHINE SULFATE) 1 by mouth three times a day - to fill Mar 19, 2010 ROXANOL 20 MG/ML ORAL SOLN (MORPHINE SULFATE) 100 cc - use asd - 1 - 2 cc by mouth q 4 hrs as needed - to fill Jan 18, 2010 ROXANOL 20 MG/ML ORAL SOLN (MORPHINE SULFATE)  100 cc - use asd - 1 - 2 cc by mouth q 4 hrs as needed - to fill sept 15, 2011 ROXANOL 20 MG/ML ORAL SOLN (MORPHINE SULFATE) 100 cc - use asd - 1 - 2 cc by mouth q 4 hrs as needed - to fill Mar 19, 2010 Prescriptions: ROXANOL 20 MG/ML ORAL SOLN (MORPHINE SULFATE) 100 cc - use asd - 1 - 2 cc by mouth q 4 hrs as needed - to fill Mar 19, 2010  #100cc x 0   Entered and Authorized by:   Corwin Levins MD   Signed by:   Corwin Levins MD on 01/03/2010   Method used:   Print then Give to Patient   RxID:   [id number] ROXANOL 20 MG/ML ORAL SOLN (MORPHINE SULFATE) 100 cc - use asd - 1 - 2 cc by mouth q 4 hrs as needed - to fill sept 15, 2011  #100cc x 0   Entered and Authorized by:   Corwin Levins MD   Signed by:  Corwin Levins MD on 01/03/2010   Method used:   Print then Give to Patient   RxID:   [id number] ROXANOL 20 MG/ML ORAL SOLN (MORPHINE SULFATE) 100 cc - use asd - 1 - 2 cc by mouth q 4 hrs as needed - to fill Jan 18, 2010  #100cc x 0   Entered and Authorized by:   Corwin Levins MD   Signed by:   Corwin Levins MD on 01/03/2010   Method used:   Print then Give to Patient   RxID:   [id number] MS CONTIN 15 MG  TB12 (MORPHINE SULFATE) 1 by mouth three times a day - to fill Mar 19, 2010  #90 x 0   Entered and Authorized by:   Corwin Levins MD   Signed by:   Corwin Levins MD on 01/03/2010   Method used:   Print then Give to Patient   RxID:   [id number] MS CONTIN 15 MG  TB12 (MORPHINE SULFATE) 1 by mouth three times a day - to fill sept 15, 2011  #90 x 0   Entered and Authorized by:   Corwin Levins MD   Signed by:   Corwin Levins MD on 01/03/2010   Method used:   Print then Give to Patient   RxID:   [id number] MS CONTIN 15 MG  TB12 (MORPHINE SULFATE) 1 by mouth three times a day - to fill Jan 18, 2010  #90 x 0   Entered and Authorized by:   Corwin Levins MD   Signed by:   Corwin Levins MD on 01/03/2010   Method used:   Print then Give to Patient   RxID:   [id number] MS  CONTIN 60 MG  TB12 (MORPHINE SULFATE) 1 by mouth three times a day - to fill Mar 19, 2010  #90 x 0   Entered and Authorized by:   Corwin Levins MD   Signed by:   Corwin Levins MD on 01/03/2010   Method used:   Print then Give to Patient   RxID:   [id number] MS CONTIN 60 MG  TB12 (MORPHINE SULFATE) 1 by mouth three times a day - to fill sept 15, 2011  #90 x 0   Entered and Authorized by:   Corwin Levins MD   Signed by:   Corwin Levins MD on 01/03/2010   Method used:   Print then Give to Patient   RxID:   [id number] MS CONTIN 60 MG  TB12 (MORPHINE SULFATE) 1 by mouth three times a day - to fill Jan 18, 2010  #90 x 0   Entered and Authorized by:   Corwin Levins MD   Signed by:   Corwin Levins MD on 01/03/2010   Method used:   Print then Give to Patient   RxID:   [id number]

## 2010-07-05 NOTE — Assessment & Plan Note (Signed)
Summary: COLD,COUGH,ACHEY/CD   Vital Signs:  Patient profile:   55 year old Donald [name] Height:      70 inches Weight:      237.50 pounds BMI:     34.20 O2 Sat:      95 % on Room air Temp:     98.2 degrees F oral Pulse rate:   84 / minute BP sitting:   110 / 82  (left arm) Cuff size:   large  Vitals Entered ByZella Ball Ewing (July 22, 2009 1:48 PM)  O2 Flow:  Room air CC: chills, sweats, body aches,weak, coughing/RE   CC:  chills, sweats, body aches, weak, and coughing/RE.  History of Present Illness: here with 3 days facial pain, pressure, fever adn greenish d/c, mild ST, general weakness and malaise;  some bloody nasal d/c as well;  has mild chills, sweats, diffuse myalgias and increased prod cough with greenish sputum;  Pt denies CP, sob, doe, wheezing, orthopnea, pnd, worsening LE edema, palps, dizziness or syncope .  Pt denies polydipsia, polyuria.  Overall good compliance with meds, trying to follow low chol, DM diet, wt stable, little excercise however   Problems Prior to Update: 1)  Gerd  (ICD-530.81) 2)  Rash-nonvesicular  (ICD-782.1) 3)  Anal Pruritus  (ICD-698.0) 4)  Skin Lesion  (ICD-709.9) 5)  Balanitis  (ICD-607.1) 6)  Diabetes Mellitus, Type II  (ICD-250.00) 7)  Chest Pain  (ICD-786.50) 8)  Otitis Media, Serous, Acute, Right  (ICD-381.01) 9)  Rash-nonvesicular  (ICD-782.1) 10)  Obstructive Sleep Apnea  (ICD-327.23) 11)  Colonic Polyps, Hx of  (ICD-V12.72) 12)  Contact With or Exposure To Venereal Diseases  (ICD-V01.6) 13)  Preventive Health Care  (ICD-V70.0) 14)  Sinusitis- Acute-nos  (ICD-461.9) 15)  Anxiety  (ICD-300.00) 16)  Urinary Incontinence, Donald [name]  (ICD-788.30) 17)  Foot Pain, Left  (ICD-729.5) 18)  Chest Pain  (ICD-786.50) 19)  Paresthesia  (ICD-782.0) 20)  Knee Pain, Left  (ICD-719.46) 21)  Family History of Alcoholism/addiction  (ICD-V61.41) Donald)  Arthroscopy, Knee, Hx of  (ICD-V45.89) 23)  Glucose Intolerance, Minimal, Hx of  (ICD-V12.2) 24)   Allergic Rhinitis  (ICD-477.9) 25)  Disc Disease, Cervical  (ICD-722.4) 26)  Positive Ppd  (ICD-795.5) 27)  Erectile Dysfunction  (ICD-302.72) 28)  Hyperlipidemia  (ICD-272.4) 29)  Post Traumatic Stress Syndrome  (ICD-309.81) 30)  Needle Biopsy, Liver, Hx of  (ICD-V15.89) 31)  Low Back Pain, Chronic  (ICD-724.2) 32)  Hepatitis C  (ICD-070.51) 33)  Depression  (ICD-311)  Medications Prior to Update: 1)  Fluticasone Propionate 50 Mcg/act Susp (Fluticasone Propionate) .... Spray 2 Spray Into Both  Nostrils Once A Day 2)  Ms Contin 60 Mg  Tb12 (Morphine Sulfate) .Marland Kitchen.. 1 By Mouth Three Times A Day - To Fill Sep 20, 2009 3)  Cialis 20 Mg  Tabs (Tadalafil) .Marland Kitchen.. 1 By Mouth Once Daily Prn 4)  Ms Contin 15 Mg  Tb12 (Morphine Sulfate) .Marland Kitchen.. 1 By Mouth Three Times A Day - To Fill Sep 20, 2009 5)  Roxanol 20 Mg/ml Oral Soln (Morphine Sulfate) .Marland Kitchen.. 100 Cc - Use Asd - 1 - 2 Cc By Mouth Q 4 Hrs As Needed - To Fill Sep 20, 2009 6)  Cetirizine Hcl 10 Mg Tabs (Cetirizine Hcl) .Marland Kitchen.. 1 By Mouth Once Daily 7)  Omeprazole 20 Mg Cpdr (Omeprazole) .Marland Kitchen.. 1 By Mouth Once Daily 8)  Vesicare 10 Mg Tabs (Solifenacin Succinate) .Marland Kitchen.. 1 By Mouth Once Daily 9)  Neurontin 100 Mg Caps (Gabapentin) .... 1/2 By Mouth  Three Times A Day 10)  Lotrisone 1-0.05 % Crea (Clotrimazole-Betamethasone) .... Use Asd Two Times A Day As Needed To Affected Area 11)  Clonazepam 0.5 Mg Tabs (Clonazepam) .... 1/2 By Mouth Qhs 12)  Tramadol Hcl 50 Mg Tabs (Tramadol Hcl) .Marland Kitchen.. 1 By Mouth Q 6 Hrs As Needed  Current Medications (verified): 1)  Fluticasone Propionate 50 Mcg/act Susp (Fluticasone Propionate) .... Spray 2 Spray Into Both  Nostrils Once A Day 2)  Ms Contin 60 Mg  Tb12 (Morphine Sulfate) .Marland Kitchen.. 1 By Mouth Three Times A Day - To Fill Sep 20, 2009 3)  Cialis 20 Mg  Tabs (Tadalafil) .Marland Kitchen.. 1 By Mouth Once Daily Prn 4)  Ms Contin 15 Mg  Tb12 (Morphine Sulfate) .Marland Kitchen.. 1 By Mouth Three Times A Day - To Fill Sep 20, 2009 5)  Roxanol 20 Mg/ml Oral Soln  (Morphine Sulfate) .Marland Kitchen.. 100 Cc - Use Asd - 1 - 2 Cc By Mouth Q 4 Hrs As Needed - To Fill Sep 20, 2009 6)  Cetirizine Hcl 10 Mg Tabs (Cetirizine Hcl) .Marland Kitchen.. 1 By Mouth Once Daily 7)  Omeprazole 20 Mg Cpdr (Omeprazole) .Marland Kitchen.. 1 By Mouth Once Daily 8)  Vesicare 10 Mg Tabs (Solifenacin Succinate) .Marland Kitchen.. 1 By Mouth Once Daily 9)  Neurontin 100 Mg Caps (Gabapentin) .... 1/2 By Mouth Three Times A Day 10)  Lotrisone 1-0.05 % Crea (Clotrimazole-Betamethasone) .... Use Asd Two Times A Day As Needed To Affected Area 11)  Clonazepam 0.5 Mg Tabs (Clonazepam) .... 1/2 By Mouth Qhs 12)  Tramadol Hcl 50 Mg Tabs (Tramadol Hcl) .Marland Kitchen.. 1 By Mouth Q 6 Hrs As Needed 13)  Promethazine-Codeine 6.25-10 Mg/53ml Syrp (Promethazine-Codeine) .Marland Kitchen.. 1 Tsp By Mouth Q 6hrs As Needed Cough 14)  Cephalexin 500 Mg Caps (Cephalexin) .Marland Kitchen.. 1 By Mouth Three Times A Day  Allergies (verified): 1)  ! * Flexeril 5 Mg 2)  ! * Latex  Past History:  Past Medical History: Last updated: 07/01/2009 Depression Hepatitis C PTSD chronic pain syndrome - cervical/lumbar Hyperlipidemia E.D. 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  , mild ill  Head:  normocephalic and atraumatic.   Eyes:  vision grossly intact, pupils equal, and pupils round.   Ears:  bilat tm's red, sinus tender bilat Nose:  nasal dischargemucosal pallor and mucosal edema.   Mouth:  pharyngeal erythema and fair dentition.   Neck:  supple and  cervical lymphadenopathy.   Lungs:  normal respiratory effort and normal breath sounds.   Heart:  normal rate and regular rhythm.   Extremities:  no edema, no erythema    Impression & Recommendations:  Problem # 1:  SINUSITIS- ACUTE-NOS (ICD-461.9)  His updated medication list for this problem includes:    Fluticasone Propionate 50 Mcg/act Susp (Fluticasone propionate) ..... Spray 2 spray into both  nostrils once a day    Promethazine-codeine 6.25-10 Mg/71ml Syrp (Promethazine-codeine) .Marland Kitchen... 1 tsp by mouth q 6hrs as needed cough    Cephalexin 500 Mg Caps (  Cephalexin) .Marland Kitchen... 1 by mouth three times a day treat as above, f/u any worsening signs or symptoms   Problem # 2:  BRONCHITIS-ACUTE (ICD-466.0)  His updated medication list for this problem includes:    Promethazine-codeine 6.25-10 Mg/18ml Syrp (Promethazine-codeine) .Marland Kitchen... 1 tsp by mouth q 6hrs as needed cough    Cephalexin 500 Mg Caps (Cephalexin) .Marland Kitchen... 1 by mouth three times a day treat as above, f/u any worsening signs or symptoms   Problem # 3:  DIABETES MELLITUS, TYPE II (ICD-250.00)  Labs Reviewed: Creat: 0.9 (09/30/2008)    Reviewed HgBA1c results: 5.9 (09/30/2008)  6.0 (06/08/2008) stable overall by hx and exam, ok to continue meds/tx as is  - does not require med at this time, to call for cbg > 200  Complete Medication List: 1)  Fluticasone Propionate 50 Mcg/act Susp (Fluticasone propionate) .... Spray 2 spray into both  nostrils once a day 2)  Ms Contin 60 Mg Tb12 (Morphine sulfate) .Marland Kitchen.. 1 by mouth three times a day - to fill Sep 20, 2009 3)  Cialis 20 Mg Tabs (Tadalafil) .Marland Kitchen.. 1 by mouth once daily prn 4)  Ms Contin 15 Mg Tb12 (Morphine sulfate) .Marland Kitchen.. 1 by mouth three times a day - to fill Sep 20, 2009 5)  Roxanol 20 Mg/ml Oral Soln (Morphine sulfate) .Marland Kitchen.. 100 cc - use asd - 1 - 2 cc by mouth q 4 hrs as needed - to fill Sep 20, 2009 6)  Cetirizine Hcl 10 Mg Tabs (Cetirizine hcl) .Marland Kitchen.. 1 by mouth once daily 7)  Omeprazole  20 Mg Cpdr (Omeprazole) .Marland Kitchen.. 1 by mouth once daily 8)  Vesicare 10 Mg Tabs (Solifenacin succinate) .Marland Kitchen.. 1 by mouth once daily 9)  Neurontin 100 Mg Caps (Gabapentin) .... 1/2 by mouth three times a day 10)  Lotrisone 1-0.05 % Crea (Clotrimazole-betamethasone) .... Use asd two times a day as needed to affected area 11)  Clonazepam 0.5 Mg Tabs (Clonazepam) .... 1/2 by mouth qhs 12)  Tramadol Hcl 50 Mg Tabs (Tramadol hcl) .Marland Kitchen.. 1 by mouth q 6 hrs as needed 13)  Promethazine-codeine 6.25-10 Mg/31ml Syrp (Promethazine-codeine) .Marland Kitchen.. 1 tsp by mouth q 6hrs as needed cough 14)  Cephalexin 500 Mg Caps (Cephalexin) .Marland Kitchen.. 1 by mouth three times a day  Patient Instructions: 1)  Please take all new medications as prescribed 2)  Continue all previous medications as before this visit  3)  you are given the excuse note today 4)  Please schedule a follow-up appointment as recommended at your last office visit Prescriptions: CEPHALEXIN 500 MG CAPS (CEPHALEXIN) 1 by mouth three times a day  #30 x 0   Entered and Authorized by:   Corwin Levins MD   Signed by:   Corwin Levins MD on 07/22/2009   Method used:   Print then Give to Patient   RxID:   [id number] PROMETHAZINE-CODEINE 6.25-10 MG/5ML SYRP (PROMETHAZINE-CODEINE) 1 tsp by mouth q 6hrs as needed cough  #6 oz x 1   Entered and Authorized by:   Corwin Levins MD   Signed by:   Corwin Levins MD on 07/22/2009   Method used:   Print then Give to Patient   RxID:   [id number]

## 2010-07-05 NOTE — Progress Notes (Signed)
Summary: Rx change  Phone Note Call from Patient Call back at Home Phone [phone]   Caller: Patient Summary of Call: pt called requesting a new Rx for Omeprazole 20mg  caps. pt says he discussed having continuing GERD sys that needed additional meds (Malox) and he was advised to increase Omeprazole to two times a day. He is now out of medication and pharm will not fill because he is not due based on directions of once daily. Okay to change and fill? Initial call taken by: Margaret Pyle, CMA,  September 21, 2009 9:22 AM  Follow-up for Phone Call        ok for two times a day  Follow-up by: Corwin Levins MD,  September 21, 2009 1:04 PM    New/Updated Medications: OMEPRAZOLE 20 MG CPDR (OMEPRAZOLE) 1 by mouth two times a day Prescriptions: OMEPRAZOLE 20 MG CPDR (OMEPRAZOLE) 1 by mouth two times a day  #60 x 10   Entered by:   Scharlene Gloss   Authorized by:   Corwin Levins MD   Signed by:   Scharlene Gloss on 09/21/2009   Method used:   Faxed to ...       [facility]* (retail)       [address]       [place], [place]  [id number]       Ph: [phone]       Fax: [phone]   RxID:   [id number]

## 2010-07-05 NOTE — Assessment & Plan Note (Signed)
Summary: ACID REFLUX---STC   Vital Signs:  Patient profile:   55 year old male Height:      70 inches Weight:      239 pounds BMI:     34.42 O2 Sat:      97 % on Room air Temp:     98.2 degrees F oral Pulse rate:   74 / minute BP sitting:   102 / 70  (left arm) Cuff size:   large  Vitals Entered ByZella Ball Ewing (July 01, 2009 3:04 PM)  O2 Flow:  Room air CC: acid reflux/RE   CC:  acid reflux/RE.  History of Present Illness: ran out of omeprazole 20 mg daily several wks ago, has been taking OTC zantac 150 mg which has helped recurrent reflux but still using frequent tums and other antacids to control;  did well with omeprazole 20 mg prior to running out;  denies dysphagia, n/v, abd pain or other GI symptoms or wt loss.  Good complaince with meds usually, Pt denies CP, sob, doe, wheezing, orthopnea, pnd, worsening LE edema, palps, dizziness or syncope  Pt denies new neuro symptoms such as headache, facial or extremity weakness   Pain well controlled on current regimen, needs refills.  Pt denies polydipsia, polyuria, or low sugar symptoms such as shakiness improved with eating.  Overall good compliance with meds, trying to follow low chol, DM diet, wt stable, little excercise however   Problems Prior to Update: 1)  Rash-nonvesicular  (ICD-782.1) 2)  Anal Pruritus  (ICD-698.0) 3)  Skin Lesion  (ICD-709.9) 4)  Balanitis  (ICD-607.1) 5)  Diabetes Mellitus, Type II  (ICD-250.00) 6)  Chest Pain  (ICD-786.50) 7)  Otitis Media, Serous, Acute, Right  (ICD-381.01) 8)  Rash-nonvesicular  (ICD-782.1) 9)  Obstructive Sleep Apnea  (ICD-327.23) 10)  Colonic Polyps, Hx of  (ICD-V12.72) 11)  Contact With or Exposure To Venereal Diseases  (ICD-V01.6) 12)  Preventive Health Care  (ICD-V70.0) 13)  Sinusitis- Acute-nos  (ICD-461.9) 14)  Anxiety  (ICD-300.00) 15)  Urinary Incontinence, Male  (ICD-788.30) 16)  Foot Pain, Left  (ICD-729.5) 17)  Chest Pain  (ICD-786.50) 18)  Paresthesia   (ICD-782.0) 19)  Knee Pain, Left  (ICD-719.46) 20)  Family History of Alcoholism/addiction  (ICD-V61.41) 21)  Arthroscopy, Knee, Hx of  (ICD-V45.89) 22)  Glucose Intolerance, Minimal, Hx of  (ICD-V12.2) 23)  Allergic Rhinitis  (ICD-477.9) 24)  Disc Disease, Cervical  (ICD-722.4) 25)  Positive Ppd  (ICD-795.5) 26)  Erectile Dysfunction  (ICD-302.72) 27)  Hyperlipidemia  (ICD-272.4) 28)  Post Traumatic Stress Syndrome  (ICD-309.81) 29)  Needle Biopsy, Liver, Hx of  (ICD-V15.89) 30)  Low Back Pain, Chronic  (ICD-724.2) 31)  Hepatitis C  (ICD-070.51) 32)  Depression  (ICD-311)  Medications Prior to Update: 1)  Fluticasone Propionate 50 Mcg/act Susp (Fluticasone Propionate) .... Spray 2 Spray Into Both  Nostrils Once A Day 2)  Ms Contin 60 Mg  Tb12 (Morphine Sulfate) .Marland Kitchen.. 1 By Mouth Three Times A Day - To Fill Jun 22, 2009 3)  Cialis 20 Mg  Tabs (Tadalafil) .Marland Kitchen.. 1 By Mouth Once Daily Prn 4)  Ms Contin 15 Mg  Tb12 (Morphine Sulfate) .Marland Kitchen.. 1 By Mouth Three Times A Day - To Fill Jun 22, 2009 5)  Roxanol 20 Mg/ml Oral Soln (Morphine Sulfate) .Marland Kitchen.. 100 Cc - Use Asd - 1 - 2 Cc By Mouth Q 4 Hrs As Needed - To Fill Jun 22, 2008 6)  Cetirizine Hcl 10 Mg Tabs (Cetirizine Hcl) .Marland KitchenMarland KitchenMarland Kitchen  1 By Mouth Once Daily 7)  Omeprazole 20 Mg Cpdr (Omeprazole) .Marland Kitchen.. 1 By Mouth Once Daily 8)  Vesicare 10 Mg Tabs (Solifenacin Succinate) .Marland Kitchen.. 1 By Mouth Once Daily 9)  Neurontin 100 Mg Caps (Gabapentin) .... 1/2 By Mouth Three Times A Day 10)  Lotrisone 1-0.05 % Crea (Clotrimazole-Betamethasone) .... Use Asd Two Times A Day As Needed To Affected Area 11)  Clonazepam 0.5 Mg Tabs (Clonazepam) .... 1/2 By Mouth Qhs 12)  Tramadol Hcl 50 Mg Tabs (Tramadol Hcl) .Marland Kitchen.. 1 By Mouth Q 6 Hrs As Needed  Current Medications (verified): 1)  Fluticasone Propionate 50 Mcg/act Susp (Fluticasone Propionate) .... Spray 2 Spray Into Both  Nostrils Once A Day 2)  Ms Contin 60 Mg  Tb12 (Morphine Sulfate) .Marland Kitchen.. 1 By Mouth Three Times A Day - To Fill  Sep 20, 2009 3)  Cialis 20 Mg  Tabs (Tadalafil) .Marland Kitchen.. 1 By Mouth Once Daily Prn 4)  Ms Contin 15 Mg  Tb12 (Morphine Sulfate) .Marland Kitchen.. 1 By Mouth Three Times A Day - To Fill Sep 20, 2009 5)  Roxanol 20 Mg/ml Oral Soln (Morphine Sulfate) .Marland Kitchen.. 100 Cc - Use Asd - 1 - 2 Cc By Mouth Q 4 Hrs As Needed - To Fill Sep 20, 2009 6)  Cetirizine Hcl 10 Mg Tabs (Cetirizine Hcl) .Marland Kitchen.. 1 By Mouth Once Daily 7)  Omeprazole 20 Mg Cpdr (Omeprazole) .Marland Kitchen.. 1 By Mouth Once Daily 8)  Vesicare 10 Mg Tabs (Solifenacin Succinate) .Marland Kitchen.. 1 By Mouth Once Daily 9)  Neurontin 100 Mg Caps (Gabapentin) .... 1/2 By Mouth Three Times A Day 10)  Lotrisone 1-0.05 % Crea (Clotrimazole-Betamethasone) .... Use Asd Two Times A Day As Needed To Affected Area 11)  Clonazepam 0.5 Mg Tabs (Clonazepam) .... 1/2 By Mouth Qhs 12)  Tramadol Hcl 50 Mg Tabs (Tramadol Hcl) .Marland Kitchen.. 1 By Mouth Q 6 Hrs As Needed  Allergies (verified): 1)  ! * Flexeril 5 Mg 2)  ! 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 h/o pos PPD - s/p INH Allergic rhinitis Colonic polyps, hx of OSA Diabetes mellitus, type II - diet GERD  Review of Systems       all otherwise negative per pt -  Physical Exam  General:  alert and overweight-appearing.   Head:  normocephalic and atraumatic.   Eyes:  vision grossly intact, pupils equal, and pupils round.   Ears:  R ear normal and L ear normal.   Nose:  no external deformity and no nasal discharge.   Mouth:  no gingival abnormalities and pharynx pink and moist.   Neck:  supple and no  masses.   Lungs:  normal respiratory effort and normal breath sounds.   Heart:  normal rate and regular rhythm.   Abdomen:  soft, non-tender, and normal bowel sounds.   Extremities:  no edema, no erythema    Impression & Recommendations:  Problem # 1:  GERD (ICD-530.81)  His updated medication list for this problem includes:    Omeprazole 20 Mg Cpdr (Omeprazole) .Marland Kitchen... 1 by mouth once daily treat as above, f/u any worsening signs or symptoms , can still  use tums prn  Problem # 2:  DIABETES MELLITUS, TYPE II (ICD-250.00)  Labs Reviewed: Creat: 0.9 (09/30/2008)    Reviewed HgBA1c results: 5.9 (09/30/2008)  6.0 (06/08/2008) stable overall by hx and exam, ok to continue meds/tx as is; does not require OHA at this time  Problem # 3:  LOW BACK PAIN, CHRONIC (ICD-724.2)  His updated medication list for this problem includes:    Ms Contin 60 Mg Tb12 (Morphine sulfate) .Marland Kitchen... 1 by mouth three times a day - to fill Sep 20, 2009    Ms Contin 15 Mg Tb12 (Morphine sulfate) .Marland Kitchen... 1 by mouth three times a day - to fill Sep 20, 2009    Roxanol 20 Mg/ml Oral Soln (Morphine sulfate) .Marland KitchenMarland KitchenMarland KitchenMarland Kitchen 100 cc - use asd - 1 - 2 cc by mouth q 4 hrs as needed - to fill Sep 20, 2009    Tramadol Hcl 50 Mg Tabs (Tramadol hcl) .Marland Kitchen... 1 by mouth q 6 hrs as needed meds refilled, total 3 mo; f/u next visit  Complete Medication List: 1)  Fluticasone Propionate 50 Mcg/act Susp (Fluticasone propionate) .... Spray 2 spray into both  nostrils once a day 2)  Ms Contin 60 Mg Tb12 (Morphine sulfate) .Marland Kitchen.. 1 by mouth three times a day - to fill Sep 20, 2009 3)  Cialis 20 Mg Tabs (Tadalafil) .Marland Kitchen.. 1 by mouth once daily prn 4)  Ms Contin 15 Mg Tb12 (Morphine sulfate) .Marland Kitchen.. 1 by mouth three times a day - to fill Sep 20, 2009 5)  Roxanol 20 Mg/ml Oral Soln (Morphine sulfate) .Marland Kitchen.. 100 cc - use asd - 1 - 2 cc by mouth q 4 hrs as needed - to fill Sep 20, 2009 6)  Cetirizine Hcl 10 Mg Tabs (Cetirizine hcl) .Marland Kitchen.. 1 by mouth once daily 7)   Omeprazole 20 Mg Cpdr (Omeprazole) .Marland Kitchen.. 1 by mouth once daily 8)  Vesicare 10 Mg Tabs (Solifenacin succinate) .Marland Kitchen.. 1 by mouth once daily 9)  Neurontin 100 Mg Caps (Gabapentin) .... 1/2 by mouth three times a day 10)  Lotrisone 1-0.05 % Crea (Clotrimazole-betamethasone) .... Use asd two times a day as needed to affected area 11)  Clonazepam 0.5 Mg Tabs (Clonazepam) .... 1/2 by mouth qhs 12)  Tramadol Hcl 50 Mg Tabs (Tramadol hcl) .Marland Kitchen.. 1 by mouth q 6 hrs as needed  Patient Instructions: 1)  Please take all new medications as prescribed 2)  Continue all previous medications as before this visit  3)  Please schedule a follow-up appointment in 3 months with CPX labs Prescriptions: ROXANOL 20 MG/ML ORAL SOLN (MORPHINE SULFATE) 100 cc - use asd - 1 - 2 cc by mouth q 4 hrs as needed - to fill Sep 20, 2009  #100cc x 0   Entered and Authorized by:   Corwin Levins MD   Signed by:   Corwin Levins MD on 07/01/2009   Method used:   Print then Give to Patient   RxID:   [id number] ROXANOL 20 MG/ML ORAL SOLN (MORPHINE SULFATE) 100 cc - use asd - 1 - 2 cc by mouth q 4 hrs as needed - to fill Aug 21, 2009  #100cc x 0   Entered and Authorized by:   Corwin Levins MD   Signed by:   Corwin Levins MD on 07/01/2009   Method used:   Print then Give to Patient   RxID:   [id number] ROXANOL 20 MG/ML ORAL SOLN (MORPHINE SULFATE) 100 cc - use asd -  1 - 2 cc by mouth q 4 hrs as needed - to fill Jul 22, 2009  #100cc x 0   Entered and Authorized by:   Corwin Levins MD   Signed by:   Corwin Levins MD on 07/01/2009   Method used:   Print then Give to Patient   RxID:   [id number] MS CONTIN 15 MG  TB12 (MORPHINE SULFATE) 1 by mouth three times a day - to fill Sep 20, 2009  #90 x 0   Entered and Authorized by:   Corwin Levins MD   Signed by:   Corwin Levins MD on 07/01/2009   Method used:   Print then Give to Patient   RxID:   [id number] MS CONTIN 15 MG  TB12 (MORPHINE SULFATE) 1 by mouth three times a day  - to fill Aug 21, 2009  #90 x 0   Entered and Authorized by:   Corwin Levins MD   Signed by:   Corwin Levins MD on 07/01/2009   Method used:   Print then Give to Patient   RxID:   [id number] MS CONTIN 15 MG  TB12 (MORPHINE SULFATE) 1 by mouth three times a day - to fill Jul 22, 2009  #90 x 0   Entered and Authorized by:   Corwin Levins MD   Signed by:   Corwin Levins MD on 07/01/2009   Method used:   Print then Give to Patient   RxID:   [id number] MS CONTIN 60 MG  TB12 (MORPHINE SULFATE) 1 by mouth three times a day - to fill Sep 20, 2009  #90 x 0   Entered and Authorized by:   Corwin Levins MD   Signed by:   Corwin Levins MD on 07/01/2009   Method used:   Print then Give to Patient   RxID:   [id number] MS CONTIN 60 MG  TB12 (MORPHINE SULFATE) 1 by mouth three times a day - to fill Aug 21, 2009  #90 x 0   Entered and Authorized by:   Corwin Levins MD   Signed by:   Corwin Levins MD on 07/01/2009   Method used:   Print then Give to Patient   RxID:   [id number] MS CONTIN 60 MG  TB12 (MORPHINE SULFATE) 1 by mouth three times a day - to fill Jul 22, 2009  #90 x 0   Entered and Authorized by:   Corwin Levins MD   Signed by:   Corwin Levins MD on 07/01/2009   Method used:   Print then Give to Patient   RxID:   [id number] OMEPRAZOLE 20 MG CPDR (OMEPRAZOLE) 1 by mouth once daily  #90 x 3   Entered and Authorized by:   Corwin Levins MD   Signed by:   Corwin Levins MD on 07/01/2009   Method used:   Print then Give to Patient   RxID:   [id number] OMEPRAZOLE 20 MG CPDR (OMEPRAZOLE) 1 by mouth once daily  #30 x 11   Entered and Authorized by:   Corwin Levins MD   Signed by:   Corwin Levins MD on 07/01/2009   Method used:   Print then Give to Patient   RxID:   [id number]

## 2010-07-05 NOTE — Letter (Signed)
Summary: Out of [facility]  [address] [place], [place] [address]   Phone: [phone]  Fax: [phone]    April 25, 2010   Student:  Rebecca Eaton    To Whom It May Concern:   For Medical reasons, please excuse the above named student from school for the following dates:  Start:   April 22, 2010  End:    May 02, 2010   Sincerely,    Minus Breeding MD    ****This is a legal document and cannot be tampered with.  Schools are authorized to verify all information and to do so accordingly.

## 2010-07-07 NOTE — Progress Notes (Signed)
Summary: medication change  Phone Note From Pharmacy   Caller: [facility]* Summary of Call: Butran is not covered by pts. insurance, would you like to change to something else? Initial call taken by: Robin Ewing CMA Duncan Dull),  May 12, 2010 3:34 PM  Follow-up for Phone Call        I dont feel o/w comfortable addressing this pt's chronic pain;  I can put back on MS contin as before, but he will also need to see pain management for long term care  ? OK with pt Follow-up by: Corwin Levins MD,  May 12, 2010 5:30 PM  Additional Follow-up for Phone Call Additional follow up Details #1::        left message on machine for pt to return my call. Margaret Pyle, CMA  May 13, 2010 8:09 AM   Pt advised and although pt was upset about being referred to pain mgmt he did agree to previous RX and referral Additional Follow-up by: Margaret Pyle, CMA,  May 16, 2010 11:25 AM    Additional Follow-up for Phone Call Additional follow up Details #2::    done hardcopy to LIM side B - dahlia  Follow-up by: Corwin Levins MD,  May 16, 2010 12:50 PM  Additional Follow-up for Phone Call Additional follow up Details #3:: Details for Additional Follow-up Action Taken: Rx in cabinet for pt pick up, pt home phone disconnected. Margaret Pyle, CMA  May 16, 2010 4:16 PM   Pt advised of Rx in cabinet Additional Follow-up by: Margaret Pyle, CMA,  May 17, 2010 3:26 PM  New/Updated Medications: MS CONTIN 30 MG XR12H-TAB (MORPHINE SULFATE) 1 by mouth two times a day MS CONTIN 15 MG XR12H-TAB (MORPHINE SULFATE) 1 by mouth two times a day Prescriptions: MS CONTIN 15 MG XR12H-TAB (MORPHINE SULFATE) 1 by mouth two times a day  #60 x 0   Entered and Authorized by:   Corwin Levins MD   Signed by:   Corwin Levins MD on 05/16/2010   Method used:   Print then Give to Patient   RxID:   [id number] MS CONTIN 30 MG XR12H-TAB (MORPHINE SULFATE) 1  by mouth two times a day  #60 x 0   Entered and Authorized by:   Corwin Levins MD   Signed by:   Corwin Levins MD on 05/16/2010   Method used:   Print then Give to Patient   RxID:   [id number]

## 2010-07-07 NOTE — Progress Notes (Signed)
Summary: Rx refill req  Phone Note Refill Request Message from:  Patient on June 10, 2010 8:29 AM  Refills Requested: Medication #1:  MS CONTIN 30 MG XR12H-TAB 1 by mouth two times a day   Dosage confirmed as above?Dosage Confirmed  Medication #2:  MS CONTIN 15 MG XR12H-TAB 1 by mouth two times a day.   Dosage confirmed as above?Dosage Confirmed  Medication #3:  ROXANOL 20 MG/ML ORAL SOLN 100 cc - use asd - 1 - 2 cc by mouth q 4 hrs as needed - to fill nov 14   Dosage confirmed as above?Dosage Confirmed  Method Requested: Pick up at Office Initial call taken by: Margaret Pyle, CMA,  June 10, 2010 8:30 AM  Follow-up for Phone Call        done hardcopy to LIM side B - dahlia   rx to be done monthly now -   referral done to pain clinic  - was overlooked before Follow-up by: Corwin Levins MD,  June 10, 2010 1:18 PM  Additional Follow-up for Phone Call Additional follow up Details #1::        Pt informed, rx in cabinet for pt pick up. Pt advised about pain clinic referral as well Additional Follow-up by: Margaret Pyle, CMA,  June 10, 2010 1:26 PM    New/Updated Medications: ROXANOL 20 MG/ML ORAL SOLN (MORPHINE SULFATE) 100 cc - use asd - 1 - 2 cc by mouth q 4 hrs as needed - to fill Jun 10, 2010 MS CONTIN 30 MG XR12H-TAB (MORPHINE SULFATE) 1 by mouth two times a day - to fill Jun 15, 2010 MS CONTIN 15 MG XR12H-TAB (MORPHINE SULFATE) 1 by mouth two times a day - to fill Jun 15, 2010 Prescriptions: MS CONTIN 15 MG XR12H-TAB (MORPHINE SULFATE) 1 by mouth two times a day - to fill Jun 15, 2010  #60 x 0   Entered and Authorized by:   Corwin Levins MD   Signed by:   Corwin Levins MD on 06/10/2010   Method used:   Print then Give to Patient   RxID:   [id number] MS CONTIN 30 MG XR12H-TAB (MORPHINE SULFATE) 1 by mouth two times a day - to fill Jun 15, 2010  #60 x 0   Entered and Authorized by:   Corwin Levins MD   Signed by:   Corwin Levins MD on  06/10/2010   Method used:   Print then Give to Patient   RxID:   [id number] ROXANOL 20 MG/ML ORAL SOLN (MORPHINE SULFATE) 100 cc - use asd - 1 - 2 cc by mouth q 4 hrs as needed - to fill Jun 10, 2010  #100cc x 0   Entered and Authorized by:   Corwin Levins MD   Signed by:   Corwin Levins MD on 06/10/2010   Method used:   Print then Give to Patient   RxID:   [id number]

## 2010-07-07 NOTE — Letter (Signed)
Summary: [facility] Consult Scheduled Letter  [facility]  [address] [place], [place] [address]   Phone: [phone]  Fax: [phone]      06/14/2010 MRN: [id number]  ANTHON HARPOLE [address] [place], [place]  [address]    Dear Mr. SZABO,      We have scheduled an appointment for you.  At the recommendation of Dr.John, we have scheduled you a consult with Dr Wynn Banker on 07/22/10 at 12:30pm.  Their phone number is [phone].  If this appointment day and time is not convenient for you, please feel free to call the office of the doctor you are being referred to at the number listed above and reschedule the appointment.     [facility] [address] [place], [address] [place], [place] [address]    Thank you,  Patient Care Coordinator [facility]

## 2010-07-08 NOTE — Letter (Signed)
Summary: [facility]  [facility]   Imported By: Sherian Rein 09/09/2009 07:39:37  _____________________________________________________________________  External Attachment:    Type:   Image     Comment:   External Document

## 2010-07-12 ENCOUNTER — Encounter: Payer: Self-pay | Admitting: Internal Medicine

## 2010-07-12 ENCOUNTER — Other Ambulatory Visit: Payer: Self-pay | Admitting: Internal Medicine

## 2010-07-12 ENCOUNTER — Ambulatory Visit (INDEPENDENT_AMBULATORY_CARE_PROVIDER_SITE_OTHER): Payer: Medicare HMO | Admitting: Internal Medicine

## 2010-07-12 ENCOUNTER — Encounter (INDEPENDENT_AMBULATORY_CARE_PROVIDER_SITE_OTHER): Payer: Self-pay | Admitting: *Deleted

## 2010-07-12 ENCOUNTER — Ambulatory Visit (INDEPENDENT_AMBULATORY_CARE_PROVIDER_SITE_OTHER)
Admission: RE | Admit: 2010-07-12 | Discharge: 2010-07-12 | Disposition: A | Discharge status: Home / Self Care | Payer: Medicare HMO | Source: Ambulatory Visit | Attending: Internal Medicine | Admitting: Internal Medicine

## 2010-07-12 DIAGNOSIS — R079 Chest pain, unspecified: Secondary | ICD-10-CM

## 2010-07-12 DIAGNOSIS — G894 Chronic pain syndrome: Secondary | ICD-10-CM

## 2010-07-12 DIAGNOSIS — K219 Gastro-esophageal reflux disease without esophagitis: Secondary | ICD-10-CM

## 2010-07-12 DIAGNOSIS — E119 Type 2 diabetes mellitus without complications: Secondary | ICD-10-CM

## 2010-07-13 ENCOUNTER — Telehealth (INDEPENDENT_AMBULATORY_CARE_PROVIDER_SITE_OTHER): Payer: Self-pay | Admitting: *Deleted

## 2010-07-13 ENCOUNTER — Encounter (INDEPENDENT_AMBULATORY_CARE_PROVIDER_SITE_OTHER): Payer: Self-pay | Admitting: *Deleted

## 2010-07-21 NOTE — Assessment & Plan Note (Signed)
Summary: heartburn/indigestion/lb   Vital Signs:  Patient profile:   55 year old male Height:      70 inches Weight:      227.50 pounds BMI:     32.76 O2 Sat:      95 % on Room air Temp:     97.9 degrees F oral Pulse rate:   77 / minute BP sitting:   110 / 72  (left arm) Cuff size:   large  Vitals Entered By: Zella Ball Ewing CMA (AAMA) (July 12, 2010 4:06 PM)  O2 Flow:  Room air  CC: Indigestion/RE   CC:  Indigestion/RE.  History of Present Illness: here to f/u  - overall doing ok except for 2 wks ago had some lemonade, then with some persistent and intermittent mild discomfort to the anterior - lef sternal chest , dull but pleuritic , not assoc with sob, diaphoreiss, n/v, palp or syncope, and some tenderness noted to the chest in the area.   Last stress test jan 2010 - neg.    Does also have some recurrent reflux despite the omeprazole, but no dysphaga, abd pain, bowel change or blood.   Could not take the cymbalta due to nausea  Brother with recent esophageal cancer  Overall pain controlled on current meds, not yet seen per new pain clinic, but has been referred.  Pt denies other CP, worsening sob, doe, wheezing, orthopnea, pnd, worsening LE edema, palps, dizziness or syncope  .  Pt denies new neuro symptoms such as headache, facial or extremity weakness  Pt denies polydipsia, polyuria, or low sugar symptoms such as shakiness improved with eating.  Overall good compliance with meds, trying to follow low chol, DM diet, wt stable, little excercise however   Problems Prior to Update: 1)  Chest Pain  (ICD-786.50) 2)  Pain in Joint Pelvic Region and Thigh  (ICD-719.45) 3)  Otitis Media, Acute  (ICD-382.9) 4)  Groin Pain  (ICD-789.09) 5)  Chronic Pain Syndrome  (ICD-338.4) 6)  Osteoarthritis, Knee, Left  (ICD-715.96) 7)  Sebaceous Cyst, Infected  (ICD-706.2) 8)  Gerd  (ICD-530.81) 9)  Rash-nonvesicular  (ICD-782.1) 10)  Anal Pruritus  (ICD-698.0) 11)  Skin Lesion   (ICD-709.9) 12)  Balanitis  (ICD-607.1) 13)  Diabetes Mellitus, Type II  (ICD-250.00) 14)  Chest Pain  (ICD-786.50) 15)  Otitis Media, Serous, Acute, Right  (ICD-381.01) 16)  Rash-nonvesicular  (ICD-782.1) 17)  Obstructive Sleep Apnea  (ICD-327.23) 18)  Colonic Polyps, Hx of  (ICD-V12.72) 19)  Contact With or Exposure To Venereal Diseases  (ICD-V01.6) 20)  Preventive Health Care  (ICD-V70.0) 21)  Sinusitis- Acute-nos  (ICD-461.9) 22)  Anxiety  (ICD-300.00) 23)  Urinary Incontinence, Male  (ICD-788.30) 24)  Foot Pain, Left  (ICD-729.5) 25)  Chest Pain  (ICD-786.50) 26)  Paresthesia  (ICD-782.0) 27)  Knee Pain, Left  (ICD-719.46) 28)  Family History of Alcoholism/addiction  (ICD-V61.41) 29)  Arthroscopy, Knee, Hx of  (ICD-V45.89) 30)  Glucose Intolerance, Minimal, Hx of  (ICD-V12.2) 31)  Allergic Rhinitis  (ICD-477.9) 32)  Disc Disease, Cervical  (ICD-722.4) 33)  Positive Ppd  (ICD-795.5) 34)  Erectile Dysfunction  (ICD-302.72) 35)  Hyperlipidemia  (ICD-272.4) 36)  Post Traumatic Stress Syndrome  (ICD-309.81) 37)  Needle Biopsy, Liver, Hx of  (ICD-V15.89) 38)  Low Back Pain, Chronic  (ICD-724.2) 39)  Hepatitis C  (ICD-070.51) 40)  Depression  (ICD-311)  Medications Prior to Update: 1)  Fluticasone Propionate 50 Mcg/act Susp (Fluticasone Propionate) .... Spray 2 Spray Into Both  Nostrils  Once A Day 2)  Cialis 5 Mg Tabs (Tadalafil) .Marland Kitchen.. 1po Once Daily 3)  Roxanol 20 Mg/ml Oral Soln (Morphine Sulfate) .Marland Kitchen.. 100 Cc - Use Asd - 1 - 2 Cc By Mouth Q 4 Hrs As Needed - To Fill Jun 10, 2010 4)  Omeprazole 20 Mg Cpdr (Omeprazole) .Marland Kitchen.. 1 By Mouth Two Times A Day 5)  Vesicare 10 Mg Tabs (Solifenacin Succinate) .Marland Kitchen.. 1 By Mouth Once Daily 6)  Cymbalta 60 Mg Cpep (Duloxetine Hcl) .Marland Kitchen.. 1 By Mouth Once Daily 7)  Clonazepam 0.5 Mg Tabs (Clonazepam) .... 1/2 By Mouth Qhs 8)  Butrans 10 Mcg/hr Ptwk (Buprenorphine) .Marland Kitchen.. 1 Patch Asd Per Wk - To Fill Apr 08, 2010 9)  Levitra 20 Mg Tabs (Vardenafil Hcl)  .Marland Kitchen.. 1po Every Other Day As Needed 10)  Azithromycin 500 Mg Tabs (Azithromycin) .Marland Kitchen.. 1 Tab Once Daily 11)  Ms Contin 30 Mg Xr12h-Tab (Morphine Sulfate) .Marland Kitchen.. 1 By Mouth Two Times A Day - To Fill Jun 15, 2010 12)  Ms Contin 39 Mg Xr12h-Tab (Morphine Sulfate) .Marland Kitchen.. 1 By Mouth Two Times A Day - To Fill Jun 15, 2010  Current Medications (verified): 1)  Fluticasone Propionate 50 Mcg/act Susp (Fluticasone Propionate) .... Spray 2 Spray Into Both  Nostrils Once A Day 2)  Cialis 5 Mg Tabs (Tadalafil) .Marland Kitchen.. 1po Once Daily 3)  Roxanol 20 Mg/ml Oral Soln (Morphine Sulfate) .Marland Kitchen.. 100 Cc - Use Asd - 1 - 2 Cc By Mouth Q 4 Hrs As Needed - To Fill Sep 11, 2010 4)  Lansoprazole 30 Mg Cpdr (Lansoprazole) .Marland Kitchen.. 1po Once Daily 5)  Vesicare 10 Mg Tabs (Solifenacin Succinate) .Marland Kitchen.. 1 By Mouth Once Daily 6)  Cymbalta 60 Mg Cpep (Duloxetine Hcl) .Marland Kitchen.. 1 By Mouth Once Daily 7)  Clonazepam 0.5 Mg Tabs (Clonazepam) .... 1/2 By Mouth Qhs 8)  Levitra 20 Mg Tabs (Vardenafil Hcl) .Marland Kitchen.. 1po Every Other Day As Needed 9)  Ms Contin 30 Mg Xr12h-Tab (Morphine Sulfate) .Marland Kitchen.. 1 By Mouth Two Times A Day - To Fill Sep 14, 2010 10)  Ms Contin 58 Mg Xr12h-Tab (Morphine Sulfate) .Marland Kitchen.. 1 By Mouth Two Times A Day - To Fill Sep 14, 2010  Allergies (verified): 1)  ! * Flexeril 5 Mg 2)  ! * Latex 3)  * Cymbalta  Past History:  Past Medical History: Last updated: 04/19/2010 Depression Hepatitis C PTSD chronic pain syndrome - cervical/lumbar Hyperlipidemia E.D. 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   Head:  normocephalic and atraumatic.   Eyes:  vision grossly intact, pupils equal, and pupils round.   Ears:  R ear normal and L ear normal.   Nose:  no external deformity and no nasal discharge.   Mouth:  no  gingival abnormalities and pharynx pink and moist.   Neck:  supple and no masses.   Lungs:  normal respiratory effort and normal breath sounds.   Heart:  normal rate and regular rhythm.   Abdomen:  soft, non-tender, and normal bowel sounds.   Msk:  no joint tenderness and no joint swelling.  , has marked left knee crepitus; no spine tender or paravertebral tender today; does have significant tenderness to left upper sternal border wtihout sweling or rash Extremities:  no edema, no erythema    Impression & Recommendations:  Problem # 1:  CHEST PAIN (ICD-786.50)  suspect MSK - to check ECG, and CXR today;  last stress test jan 2010 - ok to hold on this for now  Orders: EKG w/ Interpretation (93000) T-2 View CXR, Same Day (71020.5TC)  Problem # 2:  GERD (ICD-530.81)  His updated medication list for this problem includes:    Lansoprazole 30 Mg Cpdr (Lansoprazole) .Marland Kitchen... 1po once daily with persistent breakthrough- to change to generic lansoprazole 30 ; refer GI per pt request, brother with recent esoph cancer  Orders: Gastroenterology Referral (GI)  Problem # 3:  DIABETES MELLITUS, TYPE II (ICD-250.00)  Labs Reviewed: Creat: 1.2 (04/05/2010)    Reviewed HgBA1c results: 5.8 (04/05/2010)  5.9 (09/30/2008) stable overall by hx and exam, ok to continue meds/tx as is   Problem # 4:   CHRONIC PAIN SYNDROME (ICD-338.4) stable overall by hx and exam, ok to continue meds/tx as is - for med refills today  Complete Medication List: 1)  Fluticasone Propionate 50 Mcg/act Susp (Fluticasone propionate) .... Spray 2 spray into both  nostrils once a day 2)  Cialis 5 Mg Tabs (Tadalafil) .Marland Kitchen.. 1po once daily 3)  Roxanol 20 Mg/ml Oral Soln (Morphine sulfate) .Marland Kitchen.. 100 cc - use asd - 1 - 2 cc by mouth q 4 hrs as needed - to fill Sep 11, 2010 4)  Lansoprazole 30 Mg Cpdr (Lansoprazole) .Marland Kitchen.. 1po once daily 5)  Vesicare 10 Mg Tabs (Solifenacin succinate) .Marland Kitchen.. 1 by mouth once daily 6)  Cymbalta 60 Mg Cpep (Duloxetine hcl) .Marland Kitchen.. 1 by mouth once daily 7)  Clonazepam 0.5 Mg Tabs (Clonazepam) .... 1/2 by mouth qhs 8)  Levitra 20 Mg Tabs (Vardenafil hcl) .Marland Kitchen.. 1po every other day as needed 9)  Ms Contin 30 Mg Xr12h-tab (Morphine sulfate) .Marland Kitchen.. 1 by mouth two times a day - to fill Sep 14, 2010 10)  Ms Contin 15 Mg Xr12h-tab (Morphine sulfate) .Marland Kitchen.. 1 by mouth two times a day - to fill Sep 14, 2010  Patient Instructions: 1)  Your EKG was good today 2)  Please go to Radiology in the basement level for your X-Ray today  3)  Please call the number on the [facility] Card for results of your testing 4)  stop the omeprazole 5)  start the lansoprozole 30 mg (generic for prevacid) - 1 per day 6)  You will be contacted about the referral(s) to: GI 7)  Please schedule a follow-up appointment in 3 months for CPX with labs and : 8)  HbgA1C prior to visit, ICD-9: 250.02 9)  Urine Microalbumin prior to visit, ICD-9: Prescriptions: MS CONTIN 15 MG XR12H-TAB (MORPHINE SULFATE) 1 by mouth two times a day - to fill Sep 14, 2010  #60 x 0   Entered and Authorized by:   Corwin Levins MD   Signed by:   Corwin Levins MD on 07/12/2010   Method used:   Print then Give to  Patient   RxID:   [id number] MS CONTIN 15 MG XR12H-TAB (MORPHINE SULFATE) 1 by mouth two times a day - to fill Aug 15, 2010  #60 x 0   Entered and  Authorized by:   Corwin Levins MD   Signed by:   Corwin Levins MD on 07/12/2010   Method used:   Print then Give to Patient   RxID:   [id number] MS CONTIN 15 MG XR12H-TAB (MORPHINE SULFATE) 1 by mouth two times a day - to fill Jul 15, 2010  #60 x 0   Entered and Authorized by:   Corwin Levins MD   Signed by:   Corwin Levins MD on 07/12/2010   Method used:   Print then Give to Patient   RxID:   [id number] MS CONTIN 30 MG XR12H-TAB (MORPHINE SULFATE) 1 by mouth two times a day - to fill Sep 14, 2010  #60 x 0   Entered and Authorized by:   Corwin Levins MD   Signed by:   Corwin Levins MD on 07/12/2010   Method used:   Print then Give to Patient   RxID:   [id number] MS CONTIN 30 MG XR12H-TAB (MORPHINE SULFATE) 1 by mouth two times a day - to fill Aug 15, 2010  #60 x 0   Entered and Authorized by:   Corwin Levins MD   Signed by:   Corwin Levins MD on 07/12/2010   Method used:   Print then Give to Patient   RxID:   [id number] MS CONTIN 30 MG XR12H-TAB (MORPHINE SULFATE) 1 by mouth two times a day - to fill Jul 15, 2010  #60 x 0   Entered and Authorized by:   Corwin Levins MD   Signed by:   Corwin Levins MD on 07/12/2010   Method used:   Print then Give to Patient   RxID:   [id number] ROXANOL 20 MG/ML ORAL SOLN (MORPHINE SULFATE) 100 cc - use asd - 1 - 2 cc by mouth q 4 hrs as needed - to fill Sep 11, 2010  #100cc x 0   Entered and Authorized by:   Corwin Levins MD   Signed by:   Corwin Levins MD on 07/12/2010   Method used:   Print then Give to Patient   RxID:   [id number] ROXANOL 20 MG/ML ORAL SOLN (MORPHINE SULFATE) 100 cc - use asd - 1 - 2 cc by mouth q 4 hrs as needed - to fill Aug 12, 2010  #100cc x 0   Entered and Authorized by:   Corwin Levins MD   Signed by:   Corwin Levins MD on 07/12/2010   Method used:   Print then Give to Patient   RxID:   [id number] ROXANOL 20 MG/ML ORAL SOLN (MORPHINE SULFATE) 100 cc - use asd - 1 - 2 cc by mouth q 4 hrs as needed -  to fill Jul 12, 2010  #100cc x 0   Entered and Authorized by:   Corwin Levins MD   Signed by:   Corwin Levins MD on 07/12/2010   Method used:   Print then Give to Patient   RxID:   [id number] LANSOPRAZOLE 30 MG CPDR (LANSOPRAZOLE) 1po once daily  #90 x 3   Entered and Authorized by:   Corwin Levins MD   Signed by:   Corwin Levins MD on 07/12/2010   Method used:  Print then Give to Patient   RxID:   [id number]    Orders Added: 1)  EKG w/ Interpretation [93000] 2)  T-2 View CXR, Same Day [71020.5TC] 3)  Gastroenterology Referral [GI] 4)  Est. Patient Level IV [14782]

## 2010-07-21 NOTE — Letter (Signed)
Summary: Generic Letter  [facility]  [address] [place], [place] [address]   Phone: [phone]  Fax: [phone]    07/12/2010  Donald [name] [address] [place], [place]  [address]  [place]  To Whom it may concern,  [name] was seen in our office today. Please call the above number if you have any questions.           Sincerely,   Dr. Oliver Barre

## 2010-07-21 NOTE — Progress Notes (Signed)
----   Converted from flag ---- ---- 07/12/2010 6:51 PM, Corwin Levins MD wrote: please call pt to remind - I will no longer be writing his pain meds after the ones he received today, since I do not do chronic pain management anymore ------------------------------  Called patient informed of above information.

## 2010-07-21 NOTE — Letter (Signed)
Summary: New Patient letter  [facility]  [address] [place], [place] [address]   Phone: [phone]  Fax: [phone]       07/13/2010 MRN: [id number]  Donald [name] [address] [place], [place]  [address]  [place]  Dear Donald [name],  Welcome to the Gastroenterology Division at [facility].    You are scheduled to see Dr.  Stan Head on August 16, 2010 at 2:45pm on the 3rd floor at [facility], [address]  We ask that you try to arrive at our office 15 minutes prior to your appointment time to allow for check-in.  We would like you to complete the enclosed self-administered evaluation form prior to your visit and bring it with you on the day of your appointment.  We will review it with you.  Also, please bring a complete list of all your medications or, if you prefer, bring the medication bottles and we will list them.  Please bring your insurance card so that we may make a copy of it.  If your insurance requires a referral to see a specialist, please bring your referral form from your primary care physician.  Co-payments are due at the time of your visit and may be paid by cash, check or credit card.     Your office visit will consist of a consult with your physician (includes a physical exam), any laboratory testing he/she may order, scheduling of any necessary diagnostic testing (e.g. x-ray, ultrasound, CT-scan), and scheduling of a procedure (e.g. Endoscopy, Colonoscopy) if required.  Please allow enough time on your schedule to allow for any/all of these possibilities.    If you cannot keep your appointment, please call [phone] to cancel or reschedule prior to your appointment date.  This allows [place] the opportunity to schedule an appointment for another patient in need of care.  If you do not cancel or reschedule by 5 p.m. the business day prior to your appointment date, you will be charged a $50.00 late cancellation/no-show fee.     Thank you for choosing [facility] for your medical needs.  We appreciate the opportunity to care for you.  Please visit [place] at our website  to learn more about our practice.                     Sincerely,                                                             The Gastroenterology Division

## 2010-07-22 ENCOUNTER — Encounter: Payer: Medicare HMO | Attending: Physical Medicine & Rehabilitation

## 2010-07-22 ENCOUNTER — Ambulatory Visit: Payer: Self-pay | Admitting: Physical Medicine & Rehabilitation

## 2010-08-16 ENCOUNTER — Ambulatory Visit (INDEPENDENT_AMBULATORY_CARE_PROVIDER_SITE_OTHER): Payer: Medicare HMO | Admitting: Internal Medicine

## 2010-08-16 ENCOUNTER — Encounter: Payer: Self-pay | Admitting: Internal Medicine

## 2010-08-16 DIAGNOSIS — R079 Chest pain, unspecified: Secondary | ICD-10-CM

## 2010-08-16 DIAGNOSIS — K219 Gastro-esophageal reflux disease without esophagitis: Secondary | ICD-10-CM
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
## 2010-08-16 NOTE — Op Note (Signed)
Summary: Drainage from Anus, Anal Pain  NAME:  Donald [name], Donald [name]                ACCOUNT NO.:  [id number]      MEDICAL RECORD NO.:  [id number]          PATIENT TYPE:  AMB      LOCATION:  DAY                          FACILITY:  [facility]      PHYSICIAN:  Thomas A. Cornett, M.D.DATE OF BIRTH:  09/08/1954      DATE OF PROCEDURE:  02/14/2007   DATE OF DISCHARGE:                                  OPERATIVE REPORT      PREOPERATIVE DIAGNOSIS:   1. Persistent drainage from the anus.   2. Anal pain.      POSTOPERATIVE DIAGNOSIS:   1. Persistent drainage from the anus.   2. Anal pain.      PROCEDURE:   1. Exam under anesthesia.   2. Lateral internal sphincterotomy for posterior midline chronic anal       fissure.   3. Incision and drainage of perineal fluid collection.      SURGEON:  Maisie Fus A. Cornett, M.D.      ANESTHESIA:  LMA with 0.25% Sensorcaine local.      ESTIMATED BLOOD LOSS:  20 mL.      SPECIMENS SENT:  None.      ASSISTANT:  None.      INDICATIONS FOR PROCEDURE:  The patient is a pleasant 55 year old male   who has had a history of anal drainage and a history of swelling just   anterior to his anal canal.  This area would open and drain.  He has had   this off and on and antibiotics actually have helped clear this up off   and on.  When I saw him in the office. he had some fullness in the   region just anterior to his anus inbetween his scrotum and anus.  This   was not fluctuant or tender mass, but is where he stated most of his   drainage would come and go.  The remainder of his rectal examination was   normal.  Anoscopy revealed no other significant abnormalities at that   time.  He does have a history of taking pain medicines and MS Contin in   the past and has some constipation issues.  He presents today for exam   under anesthesia to further evaluate him given these findings in the   office.  I explained to him that multiple things could be done depending   on what is  found including a lateral internal sphincterotomy,   hemorrhoidectomy, fistulotomy if a fistula en ano was identified.  He   understood the above and was eager to proceed.      DESCRIPTION OF PROCEDURE:  The patient was brought to the operating   suite and placed supine.  After induction of LMA anesthesia, he was   placed in lithotomy.  The perianal region was prepped and draped in a   sterile fashion.  Digital examination was done.  Posterior midline was a   very large anal fissure that was not present there.  His rectal tone was   normal without  mass.  He had no external hemorrhoidal disease.  Anoscopy   was then performed.  The area of interest was anterior to his anus.   There was a fullness here today which felt fluctuant but was not red.   Anoscopy revealed no evidence of an internal opening above the dentate   line to indicate an anal fissure.  Even when I massaged on this or   pushed on this, I could not get anything internally to drain.  I elected   to use local anesthesia and to open this area up.  There was a small   amount of clear fluid that we opened up into and what appeared to be   some chronic granulation tissue and a small seroma.  I opened this up   widely and used the cautery to fulgurate the cavity and then packed this   with 1/4 inch Iodoform packing.  This was located over the perineal body   directly midway between the anal verge anteriorly and the scrotal sac.   There was no communication toward the scrotum nor toward the anal canal   to indicate a fistula en ano.  There was no stool or pus that I could   get out of this area, either.      In the posterior midline was a chronic anal fissure with exposed   sphincter.  This not present when I saw back at the end of June.  He did   have some mild grade 2 internal hemorrhoids which I did not feel was   contributing to his symptomatology though.  I did feel that   sphincterotomy was indicated given the fact that there  was exposed   sphincter.  This is a chronic appearing fissure, not an acute process,   and that this would have to be addressed at some point.  I chose his   left lateral anal canal to perform an open internal sphincterotomy.  An   incision was made over the sphincter apparatus.  I used a hemostat to   spread the mucosa away from it to identify the internal and external   sphincter.  The circular white fibers of the internal sphincter were   identified.  I placed a hemostat inbetween the internal and external   sphincter in the intersphincteric groove.  I divided the sphincter up to   the dentate line measuring approximately 2 cm.  The sphincter released   quite nicely.  There was some oozing from an internal hemorrhoid that I   controlled with cautery.  Upon examination, this appeared hemostatic   after applying some pressure.      I then placed a packing consisting of Gelfoam wrapped in Surgicel in the   anal canal with excellent hemostasis.  I infiltrated the perianal region   with 0.25% Sensorcaine for a perianal block.  All final counts of   sponge, needle and instruments were counted and found to be correct at   this portion of the case.  A large ABD pad was placed over this.  The   patient was taken out of lithotomy and the LMA was removed.  He was   taken to recovery in satisfactory condition.               Thomas A. Cornett, M.D.   Electronically Signed            TAC/MEDQ  D:  02/14/2007  T:  02/14/2007  Job:  [id number]  cc:   Corwin Levins, MD   [address]   [place]   [place] [address]

## 2010-08-16 NOTE — Procedures (Signed)
Summary: Colonoscopy   Colonoscopy  Procedure date:  01/06/2004  Findings:      Location:  [facility].   Patient Name: Donald [name], Donald [name]. MRN:  Procedure Procedures: Colonoscopy CPT: [phone].  Personnel: Endoscopist: Iva Boop, MD, [facility].  Referred By: Corwin Levins, MD.  Exam Location: Exam performed in Outpatient Clinic. Outpatient  Patient Consent: Procedure, Alternatives, Risks and Benefits discussed, consent obtained, from patient. Consent was obtained by the RN.  Indications  Average Risk Screening Routine.  History  Current Medications: Patient is not currently taking Coumadin.  Pre-Exam Physical: Performed Jan 06, 2004. Cardio-pulmonary exam, Rectal exam, HEENT exam , Abdominal exam, Mental status exam WNL.  Exam Exam: Extent of exam reached: Cecum, extent intended: Cecum.  The cecum was identified by appendiceal orifice and IC valve. Patient position: on left side. ASA Classification: III. Tolerance: excellent.  Monitoring: Pulse and BP monitoring, Oximetry used. Supplemental O2 given.  Colon Prep Used MiraLax for colon prep. Prep results: excellent.  Sedation Meds: Patient assessed and found to be appropriate for moderate (conscious) sedation. Fentanyl 75 mcg. given IV. Versed 7 mg. given IV.  Findings POLYP: Descending Colon, Maximum size: 5 mm. sessile polyp. Procedure:  biopsy without cautery, removed, retrieved, Polyp sent to pathology. ICD9: Neoplasia, Benign, Large Bowel: 211.3.  - NORMAL EXAM: Cecum to Splenic Flexure.  NORMAL EXAM: Sigmoid Colon.  OTHER FINDING: ? polyp at anal verge found in Rectum. Biopsy/Other Finding taken. Comments: ? scarring from prior hemorrhoidectomy.   Assessment Abnormal examination, see findings above.  Diagnoses: 211.3: Neoplasia, Benign, Large Bowel.   Comments: ONE POLYP. ? POLYP VS. SCAR TISSUE AT ANAL VERGE Events  Unplanned Interventions: No intervention was required.  Plans Patient  Education: Patient given standard instructions for: Polyps.  Disposition: After procedure patient sent to recovery. After recovery patient sent home.  Scheduling/Referral: Colonoscopy, to Iva Boop, MD, [facility], AWAIT PATH TO DETERMINE,

## 2010-08-23 LAB — CBC
Hemoglobin: 15.6 g/dL (ref 13.0–17.0)
Platelets: 191 10*3/uL (ref 150–400)
RDW: 12.9 % (ref 11.5–15.5)

## 2010-08-23 LAB — PROTIME-INR: Prothrombin Time: 13.1 seconds (ref 11.6–15.2)

## 2010-08-23 LAB — POCT HEMOGLOBIN-HEMACUE: Hemoglobin: 16.3 g/dL (ref 13.0–17.0)

## 2010-08-23 NOTE — Assessment & Plan Note (Signed)
Summary: GERD SCH W DEBRA HUMANA-INS COPAY A ND CX FEE ADVISED MAILED ...    History of Present Illness Visit Type: Initial Consult Primary GI MD: Stan Head MD [facility] Primary Provider: Oliver Barre, MD Chief Complaint: GERD History of Present Illness:   55 yo man with 1 year history of heartburn and reflux issues. Has been getting much worse lately also. Had omeprazole changed to lansoprazole in past month. No diference in sxs after that. Symptoms are central mid-sternal chest pain, occurs with orange juice, possibly cranberry juices, and a tea (decaffeinated usually) mixture (green and white) 12-24 oz a night. Most of the time it is before eating and can occur at anytime. The pain can may be intense and cause bending over and limiting inspiration as deep breaths may cause the pain. He will get relief using an antacid or belching after ginger ale. Not daily but several times a week. No dyspnea. These sxs do not disturb sleep.  His brother has esophageal cancer. It was diagnosed about 1 month ago. Started most of his supplements last year - has not taken x 2 days as he is starting to question value or if too much?    GI Review of Systems    Reports acid reflux, belching, bloating, and  chest pain.      Denies abdominal pain, dysphagia with liquids, dysphagia with solids, heartburn, loss of appetite, nausea, vomiting, vomiting blood, weight loss, and  weight gain.      Reports liver problems.     Denies anal fissure, black tarry stools, change in bowel habit, constipation, diarrhea, diverticulosis, fecal incontinence, heme positive stool, hemorrhoids, irritable bowel syndrome, jaundice, light color stool, rectal bleeding, and  rectal pain.    Current Medications (verified): 1)  Fluticasone Propionate 50 Mcg/act Susp (Fluticasone Propionate) .... Spray 2 Spray Into Both  Nostrils Once A Day 2)  Roxanol 20 Mg/ml Oral Soln (Morphine Sulfate) .Marland Kitchen.. 100 Cc - Use Asd - 1 - 2 Cc By Mouth Q 4 Hrs As  Needed - To Fill Sep 11, 2010 3)  Lansoprazole 30 Mg Cpdr (Lansoprazole) .Marland Kitchen.. 1po Once Daily 4)  Vesicare 10 Mg Tabs (Solifenacin Succinate) .Marland Kitchen.. 1 By Mouth Once Daily 5)  Clonazepam 0.5 Mg Tabs (Clonazepam) .... 1/2 By Mouth Qhs 6)  Ms Contin 30 Mg Xr12h-Tab (Morphine Sulfate) .Marland Kitchen.. 1 By Mouth Two Times A Day - To Fill Sep 14, 2010 7)  Ms Contin 26 Mg Xr12h-Tab (Morphine Sulfate) .Marland Kitchen.. 1 By Mouth Two Times A Day - To Fill Sep 14, 2010 8)  Tramadol Hcl 50 Mg Tabs (Tramadol Hcl) .... Take As Needed Pain 9)  Gabapentin 300 Mg Caps (Gabapentin) .Marland Kitchen.. 1 By Mouth Three Times A Day 10)  Liquid Antacid Extra Strength 500-450-40 Mg/71ml Susp (Alum & Mag Hydroxide-Simeth) .... 2-3 Tablespoons At Bedtime 11)  Antacid 500 Mg Chew (Calcium Carbonate Antacid) .... Take Along With Antacid Liquid As Needed 12)  Gas-X Extra Strength 125 Mg Caps (Simethicone) .Marland Kitchen.. 1 After Meals and At Bedtime 13)  Vitamin E 100 Unit Caps (Vitamin E) .Marland Kitchen.. 1 By Mouth Once Daily 14)  B Complex-B12  Tabs (B Complex Vitamins) .Marland Kitchen.. 1 By Mouth Once Daily 15)  Probiotic Acidophilus  Caps (Probiotic Product) .... Take 1 By Mouth Once Daily 16)  Niacin 500 Mg Tabs (Niacin) .... Take 1 By Mouth Once Daily 17)  Folic Acid 800 Mcg Tabs (Folic Acid) .Marland Kitchen.. 1 By Mouth Once Daily 18)  Folic Acid 800 Mcg Tabs (Folic Acid) .Marland KitchenMarland KitchenMarland Kitchen  1 By Mouth Once Daily 19)  Garlic 1000 Mg Caps (Garlic) .Marland Kitchen.. 1 By Mouth Once Daily 20)  Bee Pollen 580 Mg Caps (Bee Pollen) .Marland Kitchen.. 1 By Mouth Two Times A Day 21)  Calcium 1200 1200-1000 Mg-Unit Chew (Calcium Carbonate-Vit D-Min) .Marland Kitchen.. 1 By Mouth Two Times A Day 22)  Fish Oil 1000 Mg Caps (Omega-3 Fatty Acids) .Marland Kitchen.. 1 By Mouth Two Times A Day 23)  Flax Seed Oil 1000 Mg Caps (Flaxseed (Linseed)) .Marland Kitchen.. 1 By Mouth Two Times A Day 24)  Ra Omega 3-6-9  Caps (Omega 3-6-9 Fatty Acids) .Marland Kitchen.. 1 By Mouth Two Times A Day 25)  Cvs Vitamin B-6 200 Mg Tabs (Pyridoxine Hcl) .Marland Kitchen.. 1 By Mouth Once Daily 26)  Vitamin B-1 250 Mg Tabs (Thiamine Hcl) .Marland Kitchen..  1 By Mouth Once Daily 27)  Vitamin B-12 1000 Mcg Subl (Cyanocobalamin) .Marland Kitchen.. 1 By Mouth Once Daily 28)  Vitamin C Cr 500 Mg Cr-Caps (Ascorbic Acid) .Marland Kitchen.. 1 By Mouth Once Daily 29)  Ginseng 250 Mg Caps (Ginseng) .Marland Kitchen.. 1 By Mouth Once Daily  Allergies (verified): 1)  ! * Flexeril 5 Mg 2)  ! * Latex 3)  ! Adhesive Tape 4)  * Cymbalta  Past History:  Past Medical History: Depression Hepatitis C PTSD chronic pain syndrome - cervical/lumbar Hyperlipidemia E.D. h/o pos PPD - s/p INH Allergic rhinitis OSA Diabetes mellitus, type II - diet GERD knee DJD - end -stage  - Dr Shelle Iron anal fissure  Past Surgical History: s/p cervical spine fusion h/o knee surgery s/p lumbar disc surgery 1984 anal fissure repair  Family History: Reviewed history from 07/12/2010 and no changes required. Family History of Alcoholism/Addiction Family History Hypertension brother with esophageal cancer  Social History: Reviewed history from 09/12/2007 and no changes required. 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         All other ROS negative except as per HPI.   Vital Signs:  Patient profile:   55 year old male Height:      70 inches Weight:      228 pounds BMI:     32.83 Pulse rhythm:   regular Resp:     80 per minute BP sitting:   112 / 78  (left arm)  Vitals Entered By: Milford Cage NCMA (August 16, 2010 3:03 PM) CC: GERD   Physical Exam  General:  obese.  NAD Eyes:  anicteric Mouth:  clear oral and posterior phaynx Neck:  Supple; no masses or thyromegaly. Chest Wall:  nontender Lungs:  Clear throughout to auscultation. Heart:  Regular rate and rhythm; no murmurs, rubs,  or bruits. Abdomen:  obese, soft and nontender without  HSM/mass Extremities:  no edema Cervical Nodes:  No significant cervical or supraclavicular adenopathy.  Psych:  Alert and cooperative. Normal mood and affect.   Impression & Recommendations:  Problem # 1:  CHEST PAIN (ICD-786.50)  in setting of chronic ain atypical sxs respond to antacid but not PPI (increasing freq)  Orders: EGD (EGD)  Problem # 2:  GERD (ICD-530.81) Assessment: Comment Only he could have this.His  symptoms are atypical. await EGD he is concerned about brothre's hx of esophageal cancer  Patient Instructions: 1)  You have been scheduled for an endoscopy. Please follow written prep instructions that were  given to you today at your visit. 2)  We have given you a school excuse for your visit today. 3)  Copy sent to : Dr Excell Seltzer 4)  The medication list was reviewed and reconciled.  All changed / newly prescribed medications were explained.  A complete medication list was provided to the patient / caregiver.

## 2010-08-23 NOTE — Letter (Signed)
Summary: Out of [facility]  [address] [place], [place] [address]   Phone: [phone]  Fax: [phone]    August 16, 2010   Student:  Rebecca Eaton    To Whom It May Concern:   For Medical reasons, please excuse the above named student from school for the following dates:  Start:   August 16, 2010  End:    August 16, 2010  If you need additional information, please feel free to contact our office.   Sincerely,    Stan Head, MD    ****This is a legal document and cannot be tampered with.  Schools are authorized to verify all information and to do so accordingly.

## 2010-08-23 NOTE — Letter (Signed)
Summary: EGD Instructions  [facility]  [address] [place], [place] [address]   Phone: [phone]  Fax: [phone]       Donald [name]    09/08/1954    MRN: [id number]       Procedure Day Dorna Bloom: Wednesday 09/07/10     Arrival Time: 3:00 pm     Procedure Time: 4:00 pm     Location of Procedure:                    _x  _ [facility] (4th Floor)  PREPARATION FOR ENDOSCOPY   On 09/07/10 THE DAY OF THE PROCEDURE:  1.   No solid foods, milk or milk products are allowed after midnight the night before your procedure.  2.   Do not drink anything colored red or purple.  Avoid juices with pulp.  No orange juice.  3.  You may drink clear liquids until 2:00 pm, which is 2 hours before your procedure.                                                                                                CLEAR LIQUIDS INCLUDE: Water Jello Ice Popsicles Tea (sugar ok, no milk/cream) Powdered fruit flavored drinks Coffee (sugar ok, no milk/cream) Gatorade Juice: apple, white grape, white cranberry  Lemonade Clear bullion, consomm, broth Carbonated beverages (any kind) Strained chicken noodle soup Hard Candy   MEDICATION INSTRUCTIONS  Unless otherwise instructed, you should take regular prescription medications with a small sip of water as early as possible the morning of your procedure.                  OTHER INSTRUCTIONS  You will need a responsible adult at least 56 years of age to accompany you and drive you home.   This person must remain in the waiting room during your procedure.  Wear loose fitting clothing that is easily removed.  Leave jewelry and other valuables at home.  However, you may wish to bring a book to read or an iPod/MP3 player to listen to music as you wait for your procedure to start.  Remove all body piercing jewelry and leave at home.  Total time from sign-in until discharge is approximately 2-3 hours.  You should go  home directly after your procedure and rest.  You can resume normal activities the day after your procedure.  The day of your procedure you should not:   Drive   Make legal decisions   Operate machinery   Drink alcohol   Return to work  You will receive specific instructions about eating, activities and medications before you leave.    The above instructions have been reviewed and explained to me by   _______________________    I fully understand and can verbalize these instructions _____________________________ Date _________

## 2010-08-26 ENCOUNTER — Ambulatory Visit: Payer: Medicare HMO | Admitting: Physical Medicine & Rehabilitation

## 2010-08-26 ENCOUNTER — Encounter: Payer: Medicare HMO | Attending: Physical Medicine & Rehabilitation

## 2010-09-05 ENCOUNTER — Other Ambulatory Visit: Payer: Self-pay

## 2010-09-05 NOTE — Telephone Encounter (Signed)
[facility] requesting refill on Tramadol 50 mg Last OV 08/01/10.

## 2010-09-06 ENCOUNTER — Encounter: Payer: Self-pay | Admitting: Internal Medicine

## 2010-09-06 NOTE — Telephone Encounter (Signed)
Although this med has been left on his med list, it has not been prescribed since 2009, and I dont feel comfortable with this controlled substance in addition to the morphine he already has

## 2010-09-06 NOTE — Telephone Encounter (Signed)
Called pharmacy informed of MD's response to refill request.

## 2010-09-07 ENCOUNTER — Ambulatory Visit: Payer: Medicare HMO | Admitting: Internal Medicine

## 2010-09-07 ENCOUNTER — Encounter: Payer: Self-pay | Admitting: Internal Medicine

## 2010-09-07 VITALS — BP 130/95 | HR 59 | Temp 98.0°F | Resp 14 | Ht 70.0 in | Wt 232.0 lb

## 2010-09-07 DIAGNOSIS — R1013 Epigastric pain: Secondary | ICD-10-CM

## 2010-09-07 DIAGNOSIS — R12 Heartburn: Secondary | ICD-10-CM

## 2010-09-07 DIAGNOSIS — K219 Gastro-esophageal reflux disease without esophagitis: Secondary | ICD-10-CM

## 2010-09-07 DIAGNOSIS — K3189 Other diseases of stomach and duodenum: Secondary | ICD-10-CM

## 2010-09-07 MED ORDER — PANTOPRAZOLE SODIUM 40 MG PO TBEC: 40.0000 mg | DELAYED_RELEASE_TABLET | Freq: Every day | ORAL | Status: DC

## 2010-09-07 MED ORDER — SODIUM CHLORIDE 0.9 % IV SOLN: 500.0000 mL | INTRAVENOUS | Status: DC

## 2010-09-07 NOTE — Patient Instructions (Addendum)
Lansoprazole was changed to pantoprazole. Give that 6-8 weeks to see if it helps your problems. If not working let Dr. Leone Payor know or come back to the office.  You are on numerous supplements that are likely of questionable value and may be contributing to your symptoms. I recommend only taking supplements recommended by a physician. Stop all others.  Iva Boop, MD, [facility]  *Pick up prescriptions from your pharmacy. Read over discharge instructions given. Resume routine medical care with your primary physician.*

## 2010-09-08 ENCOUNTER — Telehealth: Payer: Self-pay | Admitting: *Deleted

## 2010-09-08 NOTE — Telephone Encounter (Signed)
No identifier given, no message left.

## 2010-10-07 ENCOUNTER — Other Ambulatory Visit: Payer: Self-pay | Admitting: Internal Medicine

## 2010-10-07 ENCOUNTER — Other Ambulatory Visit: Payer: Self-pay

## 2010-10-07 DIAGNOSIS — Z1289 Encounter for screening for malignant neoplasm of other sites: Secondary | ICD-10-CM

## 2010-10-07 DIAGNOSIS — IMO0001 Reserved for inherently not codable concepts without codable children: Secondary | ICD-10-CM

## 2010-10-07 DIAGNOSIS — Z0000 Encounter for general adult medical examination without abnormal findings: Secondary | ICD-10-CM

## 2010-10-11 ENCOUNTER — Other Ambulatory Visit (INDEPENDENT_AMBULATORY_CARE_PROVIDER_SITE_OTHER): Payer: Medicare HMO

## 2010-10-11 DIAGNOSIS — Z1289 Encounter for screening for malignant neoplasm of other sites: Secondary | ICD-10-CM

## 2010-10-11 DIAGNOSIS — IMO0001 Reserved for inherently not codable concepts without codable children: Secondary | ICD-10-CM

## 2010-10-11 DIAGNOSIS — Z125 Encounter for screening for malignant neoplasm of prostate: Secondary | ICD-10-CM

## 2010-10-11 DIAGNOSIS — Z0000 Encounter for general adult medical examination without abnormal findings: Secondary | ICD-10-CM

## 2010-10-11 LAB — BASIC METABOLIC PANEL
BUN: 13 mg/dL (ref 6–23)
CO2: 32 mEq/L (ref 19–32)
Chloride: 105 mEq/L (ref 96–112)
Creatinine, Ser: 1 mg/dL (ref 0.4–1.5)

## 2010-10-11 LAB — URINALYSIS
Hgb urine dipstick: NEGATIVE
Specific Gravity, Urine: 1.015 (ref 1.000–1.030)
Total Protein, Urine: NEGATIVE
Urine Glucose: NEGATIVE

## 2010-10-11 LAB — CBC WITH DIFFERENTIAL/PLATELET
Basophils Absolute: 0 10*3/uL (ref 0.0–0.1)
HCT: 44.4 % (ref 39.0–52.0)
Lymphs Abs: 2.4 10*3/uL (ref 0.7–4.0)
MCV: 92.5 fl (ref 78.0–100.0)
Monocytes Absolute: 0.5 10*3/uL (ref 0.1–1.0)
Platelets: 174 10*3/uL (ref 150.0–400.0)
RDW: 12.9 % (ref 11.5–14.6)

## 2010-10-11 LAB — HEPATIC FUNCTION PANEL
ALT: 42 U/L (ref 0–53)
AST: 37 U/L (ref 0–37)
Bilirubin, Direct: 0.1 mg/dL (ref 0.0–0.3)
Total Bilirubin: 0.6 mg/dL (ref 0.3–1.2)

## 2010-10-11 LAB — LIPID PANEL
Cholesterol: 152 mg/dL (ref 0–200)
Triglycerides: 101 mg/dL (ref 0.0–149.0)
VLDL: 20.2 mg/dL (ref 0.0–40.0)

## 2010-10-11 LAB — PSA: PSA: 0.7 ng/mL (ref 0.10–4.00)

## 2010-10-14 ENCOUNTER — Ambulatory Visit (INDEPENDENT_AMBULATORY_CARE_PROVIDER_SITE_OTHER): Payer: Medicare HMO | Admitting: Internal Medicine

## 2010-10-14 ENCOUNTER — Telehealth: Payer: Self-pay

## 2010-10-14 ENCOUNTER — Encounter: Payer: Self-pay | Admitting: Internal Medicine

## 2010-10-14 DIAGNOSIS — M545 Low back pain, unspecified: Secondary | ICD-10-CM

## 2010-10-14 DIAGNOSIS — Z0000 Encounter for general adult medical examination without abnormal findings: Secondary | ICD-10-CM | POA: Insufficient documentation

## 2010-10-14 DIAGNOSIS — E119 Type 2 diabetes mellitus without complications: Secondary | ICD-10-CM

## 2010-10-14 MED ORDER — MORPHINE SULFATE (CONCENTRATE) 20 MG/ML PO SOLN: ORAL | Status: DC

## 2010-10-14 MED ORDER — TRAMADOL HCL 50 MG PO TABS: 50.0000 mg | ORAL_TABLET | Freq: Four times a day (QID) | ORAL | Status: DC | PRN

## 2010-10-14 MED ORDER — TRAMADOL HCL (ER BIPHASIC) 200 MG PO CP24: 1.0000 | ORAL_CAPSULE | Freq: Every day | ORAL | Status: DC | PRN

## 2010-10-14 MED ORDER — CLONAZEPAM 0.5 MG PO TABS: 0.5000 mg | ORAL_TABLET | Freq: Two times a day (BID) | ORAL | Status: DC | PRN

## 2010-10-14 NOTE — Telephone Encounter (Signed)
Done per emr 

## 2010-10-14 NOTE — Telephone Encounter (Signed)
Pharmacy stating Tramadol HCL 200 mg not covered by insurance, can they change to Tramadol 50 mg every 6 hours #120, please advise

## 2010-10-14 NOTE — Assessment & Plan Note (Signed)
stable overall by hx and exam, most recent lab reviewed with pt, and pt to continue medical treatment as before  Will try tramadol and roxaonl for breakthrough pain

## 2010-10-14 NOTE — Patient Instructions (Addendum)
Take all new medications as prescribed Continue all other medications as before You will be contacted regarding the referral for: Pain clinic Please return in 6 mo with Lab testing done 3-5 days before

## 2010-10-14 NOTE — Assessment & Plan Note (Signed)
stable overall by hx and exam, most recent lab reviewed with pt, and pt to continue medical treatment as before  Lab Results  Component Value Date   HGBA1C 6.0 10/11/2010   No meds needed at this time

## 2010-10-14 NOTE — Progress Notes (Signed)
Subjective:    Patient ID: Donald [name], male    DOB: 21-Jan-1956, 55 y.o.   MRN: [id number]  HPI Here for wellness and f/u;  Overall doing ok;  Pt denies CP, worsening SOB, DOE, wheezing, orthopnea, PND, worsening LE edema, palpitations, dizziness or syncope.  Pt denies neurological change such as new Headache, facial or extremity weakness.  Pt denies polydipsia, polyuria, or low sugar symptoms. Pt states overall good compliance with treatment and medications, good tolerability, and trying to follow lower cholesterol diet.  Pt denies worsening depressive symptoms, suicidal ideation or panic. No fever, wt loss, night sweats, loss of appetite, or other constitutional symptoms.  Pt states good ability with ADL's, low fall risk, home safety reviewed and adequate, no significant changes in hearing or vision, and occasionally active with exercise.  Had to re-schedule the first appt, then missed his second appt, and they would not re-schedule. Past Medical History  Diagnosis Date  . Depression   . Hepatitis C   . PTSD (post-traumatic stress disorder)   . Chronic pain syndrome     cervical/lumbar  . Hyperlipidemia   . Erectile dysfunction   . Allergy   . Sleep apnea   . GERD (gastroesophageal reflux disease)   . DJD (degenerative joint disease) of knee   . Anal fissure   . Diabetes mellitus     pt. states he is not diabetic,once told he was borderline   Past Surgical History  Procedure Date  . S/p cervical spine fusion   . H/p knee surgury   . S/p lumbar disc surgury 1984  . Anal fissure repair     reports that he quit smoking about 22 years ago. He does not have any smokeless tobacco history on file. He reports that he does not drink alcohol or use illicit drugs. family history includes Alcohol abuse in his other; Cancer in his brother; Esophageal cancer in his brother; and Hypertension in his other. Allergies  Allergen Reactions  . Duloxetine     REACTION: nausea  . Flexeril  (Cyclobenzaprine Hcl)   . Latex     REACTION: rash  . Other     Adhesive tape   Current Outpatient Prescriptions on File Prior to Visit  Medication Sig Dispense Refill  . aluminum & magnesium hydroxide-simethicone (LIQUID ANTACID EXTRA STRENGTH) 500-450-40 MG/5ML suspension Take by mouth. 2-3 tablespoons at bedtime       . B Complex Vitamins (B COMPLEX-B12) TABS Take by mouth daily.        Alphonsus Sias Pollen 580 MG CAPS Take by mouth 2 (two) times daily.        . clonazePAM (KLONOPIN) 0.5 MG tablet Take 0.5 mg by mouth. 1/2 by mouth every evening       . Flaxseed, Linseed, (FLAX SEED OIL) 1000 MG CAPS Take by mouth 2 (two) times daily.        . fluticasone (FLONASE) 50 MCG/ACT nasal spray 2 sprays by Nasal route daily.        . Ginseng 250 MG CAPS Take 250 mg by mouth daily.        Marland Kitchen morphine (MS CONTIN) 15 MG 12 hr tablet Take 15 mg by mouth 2 (two) times daily. To fill September 14, 2010       . morphine (MS CONTIN) 30 MG 12 hr tablet Take 30 mg by mouth 2 (two) times daily. To fill September 14, 2010       . morphine (ROXANOL) 20 MG/ML concentrated solution  Take 10 mg by mouth. 100 cc use as directed 1-2 cc by mouth every 4 hours as needed to fill September 11, 2010       . Omega 3-6-9 Fatty Acids (RA OMEGA 3-6-9) CAPS Take by mouth 2 (two) times daily.        . pantoprazole (PROTONIX) 40 MG tablet Take 1 tablet (40 mg total) by mouth daily.  30 tablet  11  . Probiotic Product (PROBIOTIC ACIDOPHILUS) CAPS Take by mouth daily.        . pseudoephedrine-acetaminophen (TYLENOL SINUS) 30-500 MG TABS Take 1 tablet by mouth every 4 (four) hours as needed.        . solifenacin (VESICARE) 10 MG tablet Take 5 mg by mouth daily.        Marland Kitchen DISCONTD: calcium carbonate (ANTACID) 500 MG chewable tablet Chew 1 tablet by mouth. Take along with antacid liquid as needed       . Calcium Carbonate-Vit D-Min (CALCIUM 1200) 1200-1000 MG-UNIT CHEW Chew by mouth 2 (two) times daily at 10 AM and 5 PM.        . DISCONTD: ascorbic Acid  (VITAMIN C CR) 500 MG CPCR Take 500 mg by mouth daily.        Marland Kitchen DISCONTD: Cyanocobalamin (VITAMIN B-12) 1000 MCG SUBL Place under the tongue daily.        Marland Kitchen DISCONTD: folic acid (FOLVITE) 800 MCG tablet Take 400 mcg by mouth daily.        Marland Kitchen DISCONTD: gabapentin (NEURONTIN) 300 MG capsule Take 300 mg by mouth 3 (three) times daily.        Marland Kitchen DISCONTD: Garlic 1000 MG CAPS Take by mouth daily.        Marland Kitchen DISCONTD: niacin 500 MG tablet Take 500 mg by mouth daily.        Marland Kitchen DISCONTD: Omega-3 Fatty Acids (FISH OIL) 1000 MG CAPS Take by mouth 2 (two) times daily.        Marland Kitchen DISCONTD: pyridoxine ([employer]S VITAMIN B-6) 200 MG tablet Take 200 mg by mouth daily.        Marland Kitchen DISCONTD: Simethicone (GAS-X EXTRA STRENGTH) 125 MG CAPS Take 125 mg by mouth. 1 after meals and at bedtime       . DISCONTD: Thiamine HCl (VITAMIN B-1) 250 MG tablet Take 250 mg by mouth daily.        Marland Kitchen DISCONTD: TraMADol HCl 50 MG TBSO Take by mouth daily as needed. For pain       . DISCONTD: vitamin E 100 UNIT capsule Take 100 Units by mouth daily.         Current Facility-Administered Medications on File Prior to Visit  Medication Dose Route Frequency Provider Last Rate Last Dose  . 0.9 %  sodium chloride infusion  500 mL Intravenous Continuous Iva Boop, MD       Review of Systems Review of Systems  Constitutional: Negative for diaphoresis, activity change, appetite change and unexpected weight change.  HENT: Negative for hearing loss, ear pain, facial swelling, mouth sores and neck stiffness.   Eyes: Negative for pain, redness and visual disturbance.  Respiratory: Negative for shortness of breath and wheezing.   Cardiovascular: Negative for chest pain and palpitations.  Gastrointestinal: Negative for diarrhea, blood in stool, abdominal distention and rectal pain.  Genitourinary: Negative for hematuria, flank pain and decreased urine volume.  Musculoskeletal: Negative for myalgias and joint swelling.  Skin: Negative for color change  and wound.  Neurological: Negative for syncope  and numbness.  Hematological: Negative for adenopathy.  Psychiatric/Behavioral: Negative for hallucinations, self-injury, decreased concentration and agitation.      Objective:   Physical Exam BP 112/80  Pulse 81  Temp(Src) 98.7 F (37.1 C) (Oral)  Ht 5\' 10"  (1.778 m)  Wt 231 lb 2 oz (104.838 kg)  BMI 33.16 kg/m2  SpO2 96% Physical Exam  VS noted Constitutional: Pt is oriented to person, place, and time. Appears well-developed and well-nourished.  HENT:  Head: Normocephalic and atraumatic.  Right Ear: External ear normal.  Left Ear: External ear normal.  Nose: Nose normal.  Mouth/Throat: Oropharynx is clear and moist.  Eyes: Conjunctivae and EOM are normal. Pupils are equal, round, and reactive to light.  Neck: Normal range of motion. Neck supple. No JVD present. No tracheal deviation present.  Cardiovascular: Normal rate, regular rhythm, normal heart sounds and intact distal pulses.   Pulmonary/Chest: Effort normal and breath sounds normal.  Abdominal: Soft. Bowel sounds are normal. There is no tenderness.  Musculoskeletal: Normal range of motion. Exhibits no edema.  Lymphadenopathy:  Has no cervical adenopathy.  Neurological: Pt is alert and oriented to person, place, and time. Pt has normal reflexes. No cranial nerve deficit.  Skin: Skin is warm and dry. No rash noted.  Psychiatric:  Has  normal mood and affect. Behavior is normal.         Assessment & Plan:

## 2010-10-14 NOTE — Assessment & Plan Note (Signed)

## 2010-10-18 NOTE — Op Note (Signed)
NAME:  Donald [name], Donald [name]                ACCOUNT NO.:  [id number]   MEDICAL RECORD NO.:  [id number]          PATIENT TYPE:  AMB   LOCATION:  DAY                          FACILITY:  [facility]   PHYSICIAN:  Thomas A. Cornett, M.D.DATE OF BIRTH:  09/08/1954   DATE OF PROCEDURE:  02/14/2007  DATE OF DISCHARGE:                               OPERATIVE REPORT   PREOPERATIVE DIAGNOSIS:  1. Persistent drainage from the anus.  2. Anal pain.   POSTOPERATIVE DIAGNOSIS:  1. Persistent drainage from the anus.  2. Anal pain.   PROCEDURE:  1. Exam under anesthesia.  2. Lateral internal sphincterotomy for posterior midline chronic anal      fissure.  3. Incision and drainage of perineal fluid collection.   SURGEON:  Maisie Fus A. Cornett, M.D.   ANESTHESIA:  LMA with 0.25% Sensorcaine local.   ESTIMATED BLOOD LOSS:  20 mL.   SPECIMENS SENT:  None.   ASSISTANT:  None.   INDICATIONS FOR PROCEDURE:  The patient is a pleasant 55 year old male  who has had a history of anal drainage and a history of swelling just  anterior to his anal canal.  This area would open and drain.  He has had  this off and on and antibiotics actually have helped clear this up off  and on.  When I saw him in the office. he had some fullness in the  region just anterior to his anus inbetween his scrotum and anus.  This  was not fluctuant or tender mass, but is where he stated most of his  drainage would come and go.  The remainder of his rectal examination was  normal.  Anoscopy revealed no other significant abnormalities at that  time.  He does have a history of taking pain medicines and MS Contin in  the past and has some constipation issues.  He presents today for exam  under anesthesia to further evaluate him given these findings in the  office.  I explained to him that multiple things could be done depending  on what is found including a lateral internal sphincterotomy,  hemorrhoidectomy, fistulotomy if a fistula en ano was  identified.  He  understood the above and was eager to proceed.   DESCRIPTION OF PROCEDURE:  The patient was brought to the operating  suite and placed supine.  After induction of LMA anesthesia, he was  placed in lithotomy.  The perianal region was prepped and draped in a  sterile fashion.  Digital examination was done.  Posterior midline was a  very large anal fissure that was not present there.  His rectal tone was  normal without mass.  He had no external hemorrhoidal disease.  Anoscopy  was then performed.  The area of interest was anterior to his anus.  There was a fullness here today which felt fluctuant but was not red.  Anoscopy revealed no evidence of an internal opening above the dentate  line to indicate an anal fissure.  Even when I massaged on this or  pushed on this, I could not get anything internally to drain.  I elected  to use local anesthesia and to open this area up.  There was a small  amount of clear fluid that we opened up into and what appeared to be  some chronic granulation tissue and a small seroma.  I opened this up  widely and used the cautery to fulgurate the cavity and then packed this  with 1/4 inch Iodoform packing.  This was located over the perineal body  directly midway between the anal verge anteriorly and the scrotal sac.  There was no communication toward the scrotum nor toward the anal canal  to indicate a fistula en ano.  There was no stool or pus that I could  get out of this area, either.   In the posterior midline was a chronic anal fissure with exposed  sphincter.  This not present when I saw back at the end of June.  He did  have some mild grade 2 internal hemorrhoids which I did not feel was  contributing to his symptomatology though.  I did feel that  sphincterotomy was indicated given the fact that there was exposed  sphincter.  This is a chronic appearing fissure, not an acute process,  and that this would have to be addressed at some  point.  I chose his  left lateral anal canal to perform an open internal sphincterotomy.  An  incision was made over the sphincter apparatus.  I used a hemostat to  spread the mucosa away from it to identify the internal and external  sphincter.  The circular white fibers of the internal sphincter were  identified.  I placed a hemostat inbetween the internal and external  sphincter in the intersphincteric groove.  I divided the sphincter up to  the dentate line measuring approximately 2 cm.  The sphincter released  quite nicely.  There was some oozing from an internal hemorrhoid that I  controlled with cautery.  Upon examination, this appeared hemostatic  after applying some pressure.   I then placed a packing consisting of Gelfoam wrapped in Surgicel in the  anal canal with excellent hemostasis.  I infiltrated the perianal region  with 0.25% Sensorcaine for a perianal block.  All final counts of  sponge, needle and instruments were counted and found to be correct at  this portion of the case.  A large ABD pad was placed over this.  The  patient was taken out of lithotomy and the LMA was removed.  He was  taken to recovery in satisfactory condition.      Thomas A. Cornett, M.D.  Electronically Signed     TAC/MEDQ  D:  02/14/2007  T:  02/14/2007  Job:  [id number]   cc:   Corwin Levins, MD  [address]  [place]  [place] [address]

## 2010-10-18 NOTE — Op Note (Signed)
Donald [name], Donald [name]                ACCOUNT NO.:  [id number]   MEDICAL RECORD NO.:  [id number]          PATIENT TYPE:  AMB   LOCATION:  [facility]                         FACILITY:  [facility]   PHYSICIAN:  Jene Every, M.D.    DATE OF BIRTH:  09/08/1954   DATE OF PROCEDURE:  10/18/2007  DATE OF DISCHARGE:                               OPERATIVE REPORT   PREOPERATIVE DIAGNOSES:  1. Medial and lateral meniscus tears.  2. Degenerative joint disease left knee.   POSTOPERATIVE DIAGNOSES:  1. Medial and lateral meniscus tears.  2. Degenerative joint disease left knee.  3. Grade III chondromalacia medial femoral condyle, lateral femoral      condyle, medial tibial plateau, lateral tibial plateau.  Grade III      changes of the patella.   PROCEDURE PERFORMED:  1. Left knee arthroscopy.  2. Partial medial meniscectomy.  3. Partial lateral meniscectomy.  4. Chondroplasties of the medial and lateral femoral condyle, medial      and lateral tibial plateau and the patella.  5. Synovectomy.   BRIEF HISTORY AND INDICATIONS:  A 55 year old with knee pain following  an accident.  Medial and lateral meniscus tear, refractory to  conservative treatment.  Operative intervention is indicated for  diagnosis and treatment.  The risks and benefits discussed, including  bleeding, infection, no change in symptoms, worsening symptoms, need for  repeat debridement, anesthetic complications, etc.   TECHNIQUE:  Patient in supine position.  After induction of adequate  anesthesia and 2 gm of Kefzol, the lower extremity was prepped and  draped in the usual sterile fashion.  A lateral parapatellar portal and  a superomedial parapatellar portal was fashioned with a #11 blade.  Ingress cannula atraumatically placed.  Irrigant was utilized to  insufflate the joint.  Under direct visualization, a medial parapatellar  portal was fashioned with a #11 blade after localization with an 18  gauge needle, sparing the medial  meniscus.  The knee was copiously  lavaged.  An extensive tear of the posterior half of the medial meniscus  was noted.  A straight basket rongeur was introduced and utilized to  perform a partial medial meniscectomy extensively to a stable base,  further contoured with 4.2 Cuda shaver.  The remnant of the medial  meniscus was stable to probe palpation.  Approximately 50% of the  posterior half the meniscus was resected.  Grade III changes were noted  on the tibial plateau and the femoral condyle and chondroplasties were  performed here.  There was hypertrophic synovial nodules of the  intercondylar notch and that was debrided as well.  There was a meniscus  tear of the anterolateral aspect of the meniscus radial.  A basket was  introduced and utilized to perform a partial lateral meniscectomy to a  stable base, further contouring with a 4.2 Cuda shaver.  Chondroplasty  of the lateral femoral condyle and lateral tibial plateau were performed  as well.  The patella was examined.  There was normal patellofemoral  tracking with grade III changes, and some minor grade IV changes over  the periphery.  A  shaver was introduced and utilized to perform a  chondroplasty of patella.  The gutters were unremarkable.  I copiously  lavaged the knee and all compartments were revisited and the menisci  were stable.  No further pathology amenable to surgical arthroscopic  intervention.  The knee and scope was lavaged.  All instrumentation was  removed.  The portals were closed 4-0 nylon simple suture.  Quarter-  percent Marcaine with epinephrine was infiltrated in the joint.  The  wound was dressed sterilely.  He was awakened without difficulty and  transported to the recovery room in satisfactory condition.   The patient tolerated the procedure well with no complications.      Jene Every, M.D.  Electronically Signed     JB/MEDQ  D:  10/18/2007  T:  10/18/2007  Job:  [id number]

## 2010-10-21 NOTE — Procedures (Signed)
NAME:  ALEXSIS, BRANSCOM                ACCOUNT NO.:  [id number]   MEDICAL RECORD NO.:  [id number]          PATIENT TYPE:  OUT   LOCATION:  SLEEP CENTER                 FACILITY:  [facility]   PHYSICIAN:  Clinton D. Maple [name], M.D. DATE OF BIRTH:  09/08/1954   DATE OF STUDY:  08/15/2004                              NOCTURNAL POLYSOMNOGRAM   REFERRING PHYSICIAN:  Clabe Seal. Meryl Crutch, M.D.   INDICATION FOR STUDY:  Hypersomnia with sleep apnea.  A diagnostic NPSG done  elsewhere on May 22, 2004, recorded an RDI of 33 per hour.  Epworth  sleepiness score is 16/24.  BMI 33.8.  Weight 237 pounds.  CPAP titration is  requested.   SLEEP ARCHITECTURE:  Total sleep time 336 minutes with sleep efficiency 90%.  Stage 1 was 4%, stage 2 was 62%, stages 3 and 4 14% and REM was 20% of total  sleep time.  Sleep latency 7 minutes.  REM latency 50 minutes.  Awake after  sleep onset 21 minutes.  Arousal index of 6.8.  No sleep medicines were  taken.  The patient arrived late due to a late dinner, but then wanted  something more to eat before bed, delaying the sleep onset until 12:21 a.m.   RESPIRATORY DATA:  CPAP protocol.  CPAP was titrated to 9 CWP, RDI 4.5 per  hour, using an Innomed Nasal Aire II nasal mask with medium nasal pillows  and a heated humidifier.   OXYGEN DATA:  Mild to moderate snoring before CPAP control with oxygen  desaturation of 85%.  After CPAP control, snoring was prevented and  saturation held 94-96% on room air.   CARDIAC DATA:  Normal sinus rhythm.   MOVEMENT/PARASOMNIA:  A total of 28 limb jerks were recorded of which only  one had any effect on sleep for an index of 0.2 per hour, which is  insignificant.   IMPRESSION/RECOMMENDATION:  1.  Successful CPAP titration to 9 CPW, RDI 4.5 per hour, using an Innomed      Nasal Aire II nasal mask with medium nasal pillows and a heated      humidifier.  2.  Diagnostic NPSG reported on May 22, 2004, RDI 33 per hour.      CDY/MEDQ   D:  08/21/2004 11:18:46  T:  08/22/2004 10:05:56  Job:  [id number]

## 2010-12-06 ENCOUNTER — Telehealth: Payer: Self-pay

## 2010-12-06 DIAGNOSIS — M545 Low back pain, unspecified: Secondary | ICD-10-CM

## 2010-12-06 DIAGNOSIS — G8929 Other chronic pain: Secondary | ICD-10-CM

## 2010-12-06 NOTE — Telephone Encounter (Signed)
Pt is requesting referral to pain management. Pt has appt with [facility] but appt was missed and they will no longer see pt. Referral is needed for new pain management clinic.

## 2010-12-06 NOTE — Telephone Encounter (Signed)
Ok for referral  Please notify pt, and please let him know I can no longer (from now) be responsible for his pain meds

## 2010-12-30 ENCOUNTER — Ambulatory Visit: Payer: Medicare HMO | Admitting: Internal Medicine

## 2011-01-03 ENCOUNTER — Encounter: Payer: Self-pay | Admitting: Internal Medicine

## 2011-01-03 ENCOUNTER — Ambulatory Visit (INDEPENDENT_AMBULATORY_CARE_PROVIDER_SITE_OTHER): Payer: Medicare HMO | Admitting: Internal Medicine

## 2011-01-03 VITALS — BP 130/92 | HR 90 | Temp 98.5°F | Ht 70.0 in

## 2011-01-03 DIAGNOSIS — G894 Chronic pain syndrome: Secondary | ICD-10-CM

## 2011-01-03 DIAGNOSIS — E119 Type 2 diabetes mellitus without complications: Secondary | ICD-10-CM

## 2011-01-03 DIAGNOSIS — Z0000 Encounter for general adult medical examination without abnormal findings: Secondary | ICD-10-CM

## 2011-01-03 DIAGNOSIS — F329 Major depressive disorder, single episode, unspecified: Secondary | ICD-10-CM

## 2011-01-03 DIAGNOSIS — R41840 Attention and concentration deficit: Secondary | ICD-10-CM | POA: Insufficient documentation

## 2011-01-03 NOTE — Patient Instructions (Addendum)
Continue all other medications as before You will be contacted regarding the referral for: psychology (for ADD testing) and psychiatry (for depression)

## 2011-01-03 NOTE — Progress Notes (Signed)
Subjective:    Patient ID: Donald [name], male    DOB: 30-Dec-1955, 55 y.o.   MRN: [id number]  HPI here  To f/u; seen at [facility] clinic July 24, tx with suboxone, now off morphine;  C/o mild abd pain that seems coincident with med use and now improved but he is wondering about morphine withdrawal or even incidental food poisoning as he also had some loose bowel, less today.   Pt denies fever, wt loss, night sweats, loss of appetite, or other constitutional symptoms  Nausea less intense.  Denies worsening reflux, dysphagia, or blood.  PA at [facility] per pt suggested adderall with his hx of of mult anti-depressant meds not working well in the past.  Pt would like to pursue ADD evaluation, has some attention decrease for several yrs..  Also here "baseline substance abuse evaluation"  - but I am not sure to what this refers. Pt would also like referral for depression tx as well.  No suicidal ideation currently, has had non-response per pt to meds in the past. Past Medical History  Diagnosis Date  . Depression   . Hepatitis C   . PTSD (post-traumatic stress disorder)   . Chronic pain syndrome     cervical/lumbar  . Hyperlipidemia   . Erectile dysfunction   . Allergy   . Sleep apnea   . GERD (gastroesophageal reflux disease)   . DJD (degenerative joint disease) of knee   . Anal fissure   . Diabetes mellitus     pt. states he is not diabetic,once told he was borderline   Past Surgical History  Procedure Date  . S/p cervical spine fusion   . H/p knee surgury   . S/p lumbar disc surgury 1984  . Anal fissure repair     reports that he quit smoking about 22 years ago. He does not have any smokeless tobacco history on file. He reports that he does not drink alcohol or use illicit drugs. family history includes Alcohol abuse in his other; Cancer in his brother; Esophageal cancer in his brother; and Hypertension in his other. Allergies  Allergen Reactions  . Duloxetine     REACTION: nausea  . Flexeril  (Cyclobenzaprine Hcl)   . Latex     REACTION: rash  . Other     Adhesive tape   Current Outpatient Prescriptions on File Prior to Visit  Medication Sig Dispense Refill  . aluminum & magnesium hydroxide-simethicone (LIQUID ANTACID EXTRA STRENGTH) 500-450-40 MG/5ML suspension Take by mouth. 2-3 tablespoons at bedtime       . B Complex Vitamins (B COMPLEX-B12) TABS Take by mouth daily.        Alphonsus Sias Pollen 580 MG CAPS Take by mouth 2 (two) times daily.        . Calcium Carbonate-Vit D-Min (CALCIUM 1200) 1200-1000 MG-UNIT CHEW Chew by mouth 2 (two) times daily at 10 AM and 5 PM.        . clonazePAM (KLONOPIN) 0.5 MG tablet Take 1 tablet (0.5 mg total) by mouth 2 (two) times daily as needed for anxiety. 1/2 by mouth every evening  60 tablet  3  . Flaxseed, Linseed, (FLAX SEED OIL) 1000 MG CAPS Take by mouth 2 (two) times daily.        . fluticasone (FLONASE) 50 MCG/ACT nasal spray 2 sprays by Nasal route daily.        . Ginseng 250 MG CAPS Take 250 mg by mouth daily.        Marland Kitchen  Omega 3-6-9 Fatty Acids (RA OMEGA 3-6-9) CAPS Take by mouth 2 (two) times daily.        . pantoprazole (PROTONIX) 40 MG tablet Take 1 tablet (40 mg total) by mouth daily.  30 tablet  11  . Probiotic Product (PROBIOTIC ACIDOPHILUS) CAPS Take by mouth daily.        . pseudoephedrine-acetaminophen (TYLENOL SINUS) 30-500 MG TABS Take 1 tablet by mouth every 4 (four) hours as needed.        . solifenacin (VESICARE) 10 MG tablet Take 5 mg by mouth daily.        . traMADol (ULTRAM) 50 MG tablet Take 1 tablet (50 mg total) by mouth every 6 (six) hours as needed for pain.  120 tablet  2  . TraMADol HCl 200 MG CP24 Take 1 tablet by mouth daily as needed.  30 capsule  2   Current Facility-Administered Medications on File Prior to Visit  Medication Dose Route Frequency Provider Last Rate Last Dose  . 0.9 %  sodium chloride infusion  500 mL Intravenous Continuous Iva Boop, MD       Review of Systems Review of Systems    Constitutional: Negative for diaphoresis and unexpected weight change.  HENT: Negative for drooling and tinnitus.   Eyes: Negative for photophobia and visual disturbance.  Respiratory: Negative for choking and stridor.   Gastrointestinal: Negative for vomiting and blood in stool.  Genitourinary: Negative for hematuria and decreased urine volume.      Objective:   Physical Exam BP 130/92  Pulse 90  Temp(Src) 98.5 F (36.9 C) (Oral)  Ht 5\' 10"  (1.778 m)  SpO2 95% Physical Exam  VS noted, obese Constitutional: Pt appears well-developed and well-nourished.  HENT: Head: Normocephalic.  Right Ear: External ear normal.  Left Ear: External ear normal.  Eyes: Conjunctivae and EOM are normal. Pupils are equal, round, and reactive to light.  Neck: Normal range of motion. Neck supple.  Cardiovascular: Normal rate and regular rhythm.   Pulmonary/Chest: Effort normal and breath sounds normal.  Abd:  Soft, NT, non-distended, + BS Neurological: Pt is alert. No cranial nerve deficit.  Skin: Skin is warm. No erythema.  Psychiatric: Pt behavior is normal. Thought content normal. depressed affect, 1+ nervous        Assessment & Plan:

## 2011-01-03 NOTE — Assessment & Plan Note (Addendum)
Ok for referral to [facility] for eval; may need adderall,  to f/u any worsening symptoms or concerns

## 2011-01-03 NOTE — Assessment & Plan Note (Signed)
Now on suboxone, likely related to abd discomfort, loose stoools, exam o/w benign,  to f/u any worsening symptoms or concerns

## 2011-01-03 NOTE — Assessment & Plan Note (Signed)
stable overall by hx and exam, most recent data reviewed with pt, and pt to continue medical treatment as before  Lab Results  Component Value Date   HGBA1C 6.0 10/11/2010

## 2011-01-03 NOTE — Assessment & Plan Note (Signed)
Moderate persistent - for psychiatry referral as well, hold on specific tx at this time, verified nonsuicidal

## 2011-01-30 ENCOUNTER — Ambulatory Visit (INDEPENDENT_AMBULATORY_CARE_PROVIDER_SITE_OTHER): Payer: Medicare HMO | Admitting: Licensed Clinical Social Worker

## 2011-01-30 DIAGNOSIS — F4323 Adjustment disorder with mixed anxiety and depressed mood: Secondary | ICD-10-CM

## 2011-02-13 ENCOUNTER — Ambulatory Visit (INDEPENDENT_AMBULATORY_CARE_PROVIDER_SITE_OTHER): Payer: Medicare HMO | Admitting: Licensed Clinical Social Worker

## 2011-02-13 DIAGNOSIS — F4323 Adjustment disorder with mixed anxiety and depressed mood: Secondary | ICD-10-CM

## 2011-03-01 LAB — POCT HEMOGLOBIN-HEMACUE: Hemoglobin: 13.7

## 2011-03-16 ENCOUNTER — Other Ambulatory Visit: Payer: Self-pay | Admitting: Specialist

## 2011-03-16 DIAGNOSIS — M25562 Pain in left knee: Secondary | ICD-10-CM

## 2011-03-17 LAB — DIFFERENTIAL
Basophils Absolute: 0
Eosinophils Absolute: 0.1
Eosinophils Relative: 2
Lymphocytes Relative: 40
Monocytes Absolute: 0.7

## 2011-03-17 LAB — COMPREHENSIVE METABOLIC PANEL
ALT: 37
AST: 36
Albumin: 3.4 — ABNORMAL LOW
Chloride: 104
Creatinine, Ser: 1.11
GFR calc Af Amer: >60
Potassium: 4
Sodium: 139
Total Bilirubin: 0.9

## 2011-03-17 LAB — CBC
Platelets: 203
RBC: 4.99
WBC: 7

## 2011-03-27 ENCOUNTER — Inpatient Hospital Stay: Admission: RE | Admit: 2011-03-27 | Payer: Medicare HMO | Source: Ambulatory Visit

## 2011-04-04 ENCOUNTER — Inpatient Hospital Stay: Admission: RE | Admit: 2011-04-04 | Payer: Medicare HMO | Source: Ambulatory Visit

## 2011-04-06 ENCOUNTER — Ambulatory Visit
Admission: RE | Admit: 2011-04-06 | Discharge: 2011-04-06 | Disposition: A | Discharge status: Home / Self Care | Payer: Medicare HMO | Source: Ambulatory Visit | Attending: Specialist | Admitting: Specialist

## 2011-04-06 ENCOUNTER — Other Ambulatory Visit (INDEPENDENT_AMBULATORY_CARE_PROVIDER_SITE_OTHER): Payer: Medicare HMO

## 2011-04-06 DIAGNOSIS — E119 Type 2 diabetes mellitus without complications: Secondary | ICD-10-CM

## 2011-04-06 DIAGNOSIS — Z125 Encounter for screening for malignant neoplasm of prostate: Secondary | ICD-10-CM

## 2011-04-06 DIAGNOSIS — M25562 Pain in left knee: Secondary | ICD-10-CM

## 2011-04-06 DIAGNOSIS — Z0000 Encounter for general adult medical examination without abnormal findings: Secondary | ICD-10-CM

## 2011-04-06 LAB — LIPID PANEL
HDL: 52 mg/dL (ref >39.00)
Total CHOL/HDL Ratio: 3
VLDL: 30.2 mg/dL (ref 0.0–40.0)

## 2011-04-06 LAB — CBC WITH DIFFERENTIAL/PLATELET
Eosinophils Absolute: 0.1 10*3/uL (ref 0.0–0.7)
Eosinophils Relative: 0.9 % (ref 0.0–5.0)
HCT: 44.3 % (ref 39.0–52.0)
Lymphs Abs: 2.4 10*3/uL (ref 0.7–4.0)
MCHC: 33.4 g/dL (ref 30.0–36.0)
MCV: 92.5 fl (ref 78.0–100.0)
Monocytes Absolute: 0.4 10*3/uL (ref 0.1–1.0)
Neutrophils Relative %: 50 % (ref 43.0–77.0)
Platelets: 174 10*3/uL (ref 150.0–400.0)
RDW: 12.7 % (ref 11.5–14.6)
WBC: 5.8 10*3/uL (ref 4.5–10.5)

## 2011-04-06 LAB — URINALYSIS, ROUTINE W REFLEX MICROSCOPIC
Bilirubin Urine: NEGATIVE
Hgb urine dipstick: NEGATIVE
Leukocytes, UA: NEGATIVE
Total Protein, Urine: NEGATIVE
pH: 5.5 (ref 5.0–8.0)

## 2011-04-06 LAB — HEPATIC FUNCTION PANEL
ALT: 45 U/L (ref 0–53)
AST: 50 U/L — ABNORMAL HIGH (ref 0–37)
Alkaline Phosphatase: 54 U/L (ref 39–117)
Bilirubin, Direct: 0.2 mg/dL (ref 0.0–0.3)
Total Bilirubin: 0.7 mg/dL (ref 0.3–1.2)

## 2011-04-06 LAB — TSH: TSH: 0.72 u[IU]/mL (ref 0.35–5.50)

## 2011-04-06 LAB — BASIC METABOLIC PANEL
CO2: 31 mEq/L (ref 19–32)
Calcium: 9.3 mg/dL (ref 8.4–10.5)
GFR: 100.36 mL/min (ref >60.00)
Sodium: 143 mEq/L (ref 135–145)

## 2011-04-06 LAB — PSA: PSA: 0.58 ng/mL (ref 0.10–4.00)

## 2011-04-07 LAB — BASIC METABOLIC PANEL
BUN: 14 mg/dL (ref 6–23)
Chloride: 105 mEq/L (ref 96–112)
GFR: 95.87 mL/min (ref >60.00)
Potassium: 4.1 mEq/L (ref 3.5–5.1)
Sodium: 142 mEq/L (ref 135–145)

## 2011-04-07 LAB — LIPID PANEL
LDL Cholesterol: 75 mg/dL (ref 0–99)
Total CHOL/HDL Ratio: 3
VLDL: 30.4 mg/dL (ref 0.0–40.0)

## 2011-04-21 ENCOUNTER — Ambulatory Visit (INDEPENDENT_AMBULATORY_CARE_PROVIDER_SITE_OTHER): Payer: Medicare HMO | Admitting: Internal Medicine

## 2011-04-21 ENCOUNTER — Encounter: Payer: Self-pay | Admitting: Internal Medicine

## 2011-04-21 VITALS — BP 118/80 | HR 77 | Temp 98.1°F | Ht 70.0 in | Wt 236.4 lb

## 2011-04-21 DIAGNOSIS — R5381 Other malaise: Secondary | ICD-10-CM

## 2011-04-21 DIAGNOSIS — R5383 Other fatigue: Secondary | ICD-10-CM | POA: Insufficient documentation

## 2011-04-21 DIAGNOSIS — E119 Type 2 diabetes mellitus without complications: Secondary | ICD-10-CM

## 2011-04-21 DIAGNOSIS — Z0000 Encounter for general adult medical examination without abnormal findings: Secondary | ICD-10-CM

## 2011-04-21 DIAGNOSIS — J019 Acute sinusitis, unspecified: Secondary | ICD-10-CM | POA: Insufficient documentation

## 2011-04-21 MED ORDER — HYDROCODONE-HOMATROPINE 5-1.5 MG/5ML PO SYRP: 5.0000 mL | ORAL_SOLUTION | Freq: Four times a day (QID) | ORAL | Status: AC | PRN

## 2011-04-21 MED ORDER — AZITHROMYCIN 250 MG PO TABS: ORAL_TABLET | ORAL | Status: AC

## 2011-04-21 NOTE — Patient Instructions (Addendum)
Take all new medications as prescribed Continue all other medications as before Please have the pharmacy call with any refills you may need Please return in 6 mo with Lab testing done 3-5 days before Please keep your appointments with your specialists as you have planned - Dr Shelle Iron next week

## 2011-04-23 ENCOUNTER — Encounter: Payer: Self-pay | Admitting: Internal Medicine

## 2011-04-23 NOTE — Assessment & Plan Note (Signed)

## 2011-04-23 NOTE — Assessment & Plan Note (Signed)
Mild to mod, for antibx course,  to f/u any worsening symptoms or concerns 

## 2011-04-23 NOTE — Assessment & Plan Note (Signed)
Etiology unclear, Exam otherwise benign, to check labs as documented, follow with expectant management, for testosterone check Lab Results  Component Value Date   WBC 5.8 04/06/2011   HGB 14.8 04/06/2011   HCT 44.3 04/06/2011   PLT 174.0 04/06/2011   GLUCOSE 77 04/06/2011   GLUCOSE 79 04/06/2011   CHOL 154 04/06/2011   CHOL 155 04/06/2011   TRIG 151.0* 04/06/2011   TRIG 152.0* 04/06/2011   HDL 52.00 04/06/2011   HDL 16.10 04/06/2011   LDLCALC 72 04/06/2011   LDLCALC 75 04/06/2011   ALT 45 04/06/2011   AST 50* 04/06/2011   NA 143 04/06/2011   NA 142 04/06/2011   K 4.1 04/06/2011   K 4.1 04/06/2011   CL 105 04/06/2011   CL 105 04/06/2011   CREATININE 1.0 04/06/2011   CREATININE 1.0 04/06/2011   BUN 14 04/06/2011   BUN 14 04/06/2011   CO2 31 04/06/2011   CO2 26 04/06/2011   TSH 0.72 04/06/2011   PSA 0.58 04/06/2011   INR 1.00 10/08/2009   HGBA1C 5.9 04/06/2011   MICROALBUR 0.3 10/11/2010

## 2011-04-23 NOTE — Progress Notes (Signed)
Subjective:    Patient ID: Donald [name], male    DOB: 1956/06/03, 55 y.o.   MRN: [id number]  HPI Here for wellness and f/u;  Overall doing ok;  Pt denies CP, worsening SOB, DOE, wheezing, orthopnea, PND, worsening LE edema, palpitations, dizziness or syncope.  Pt denies neurological change such as new Headache, facial or extremity weakness.  Pt denies polydipsia, polyuria, or low sugar symptoms. Pt states overall good compliance with treatment and medications, good tolerability, and trying to follow lower cholesterol diet.  Pt denies worsening depressive symptoms, suicidal ideation or panic. No fever, wt loss, night sweats, loss of appetite, or other constitutional symptoms.  Pt states good ability with ADL's, low fall risk, home safety reviewed and adequate, no significant changes in hearing or vision, and occasionally active with exercise.  Incidnetly though -  Here with 3 days acute onset fever, facial pain, pressure, general weakness and malaise.  Does have sense of ongoing fatigue, but denies signficant hypersomnolence, asks for testosterone check.  Also with ongoing left knee pain, had recent MRI, results pending and appt in f/u with ortho/Dr Beane next wk. Past Medical History  Diagnosis Date  . Depression   . Hepatitis C   . PTSD (post-traumatic stress disorder)   . Chronic pain syndrome     cervical/lumbar  . Hyperlipidemia   . Erectile dysfunction   . Allergy   . Sleep apnea   . GERD (gastroesophageal reflux disease)   . DJD (degenerative joint disease) of knee   . Anal fissure   . Diabetes mellitus     pt. states he is not diabetic,once told he was borderline   Past Surgical History  Procedure Date  . S/p cervical spine fusion   . H/p knee surgury   . S/p lumbar disc surgury 1984  . Anal fissure repair     reports that he quit smoking about 22 years ago. He does not have any smokeless tobacco history on file. He reports that he does not drink alcohol or use illicit  drugs. family history includes Alcohol abuse in his other; Cancer in his brother; Esophageal cancer in his brother; and Hypertension in his other. Allergies  Allergen Reactions  . Duloxetine     REACTION: nausea  . Flexeril (Cyclobenzaprine Hcl)   . Latex     REACTION: rash  . Other     Adhesive tape   Current Outpatient Prescriptions on File Prior to Visit  Medication Sig Dispense Refill  . buprenorphine-naloxone (SUBOXONE) 8-2 MG SUBL Place under the tongue 2 (two) times daily.        . clonazePAM (KLONOPIN) 0.5 MG tablet Take 1 tablet (0.5 mg total) by mouth 2 (two) times daily as needed for anxiety. 1/2 by mouth every evening  60 tablet  3  . fluticasone (FLONASE) 50 MCG/ACT nasal spray 2 sprays by Nasal route daily.        . Omega 3-6-9 Fatty Acids (RA OMEGA 3-6-9) CAPS Take by mouth 2 (two) times daily.        . pantoprazole (PROTONIX) 40 MG tablet Take 1 tablet (40 mg total) by mouth daily.  30 tablet  11  . Probiotic Product (PROBIOTIC ACIDOPHILUS) CAPS Take by mouth daily.        . solifenacin (VESICARE) 10 MG tablet Take 5 mg by mouth daily.        . traMADol (ULTRAM) 50 MG tablet Take 1 tablet (50 mg total) by mouth every 6 (six) hours as  needed for pain.  120 tablet  2   Review of Systems Review of Systems  Constitutional: Negative for diaphoresis, activity change, appetite change and unexpected weight change.  HENT: Negative for hearing loss, ear pain, facial swelling, mouth sores and neck stiffness.   Eyes: Negative for pain, redness and visual disturbance.  Respiratory: Negative for shortness of breath and wheezing.   Cardiovascular: Negative for chest pain and palpitations.  Gastrointestinal: Negative for diarrhea, blood in stool, abdominal distention and rectal pain.  Genitourinary: Negative for hematuria, flank pain and decreased urine volume.  Musculoskeletal: Negative for myalgias and joint swelling.  Skin: Negative for color change and wound.  Neurological:  Negative for syncope and numbness.  Hematological: Negative for adenopathy.  Psychiatric/Behavioral: Negative for hallucinations, self-injury, decreased concentration and agitation.      Objective:   Physical Exam BP 118/80  Pulse 77  Temp(Src) 98.1 F (36.7 C) (Oral)  Ht 5\' 10"  (1.778 m)  Wt 236 lb 6 oz (107.219 kg)  BMI 33.92 kg/m2  SpO2 93% Physical Exam  VS noted,  Constitutional: Pt is oriented to person, place, and time. Appears well-developed and well-nourished.  HENT:  Head: Normocephalic and atraumatic.  Right Ear: External ear normal.  Left Ear: External ear normal.  Nose: Nose normal.  Bilat tm's mild erythema.  Sinus tender bilat.  Pharynx mild erythema Mouth/Throat: Oropharynx is clear and moist.  Eyes: Conjunctivae and EOM are normal. Pupils are equal, round, and reactive to light.  Neck: Normal range of motion. Neck supple. No JVD present. No tracheal deviation present.  Cardiovascular: Normal rate, regular rhythm, normal heart sounds and intact distal pulses.   Pulmonary/Chest: Effort normal and breath sounds normal.  Abdominal: Soft. Bowel sounds are normal. There is no tenderness.  Musculoskeletal: Normal range of motion. Exhibits no edema.  Lymphadenopathy:  Has no cervical adenopathy.  Neurological: Pt is alert and oriented to person, place, and time. Pt has normal reflexes. No cranial nerve deficit.  Skin: Skin is warm and dry. No rash noted.  Psychiatric:  Has  normal mood and affect. Behavior is normal.  Left knee with 1+ effusion, decreased ROM    Assessment & Plan:

## 2011-04-23 NOTE — Assessment & Plan Note (Signed)
stable overall by hx and exam, most recent data reviewed with pt, and pt to continue medical treatment as before  Lab Results  Component Value Date   HGBA1C 5.9 04/06/2011

## 2011-05-01 ENCOUNTER — Other Ambulatory Visit: Payer: Self-pay | Admitting: Internal Medicine

## 2011-05-03 ENCOUNTER — Telehealth: Payer: Self-pay

## 2011-05-03 NOTE — Telephone Encounter (Signed)
Faxed [phone]) Surgical Clearance form for this patient to [facility] on 05/03/2011. Once faxed was sent to scan.

## 2011-05-06 IMAGING — US US ABDOMEN COMPLETE
1 series · 14 of 25 positions shown · non-contrast
Comparison: 04/01/2008

CLINICAL DATA: Hepatitis C

COMPLETE ABDOMINAL ULTRASOUND

[Series 1: us abdomen complete · 0.31mm/px · 14 of 71 slices shown]
[im 1/71]
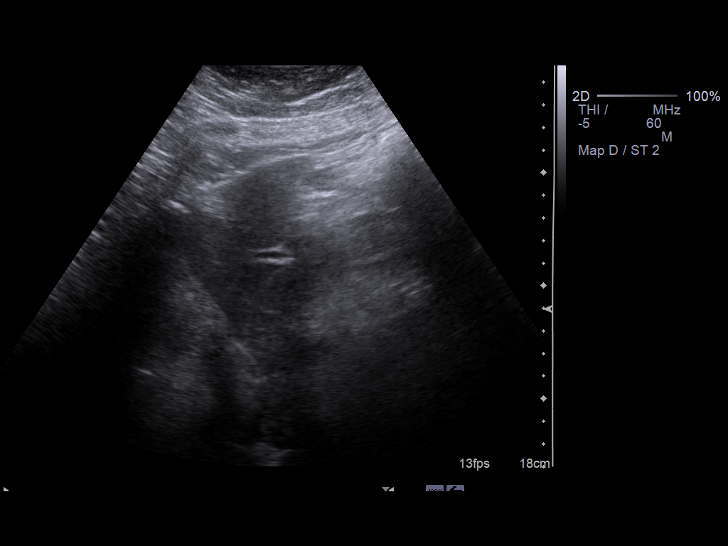
[im 6/71]
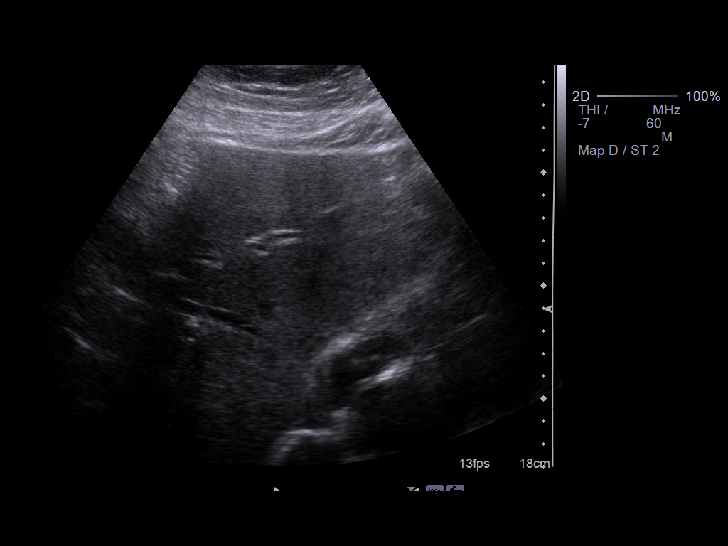
[im 12/71]
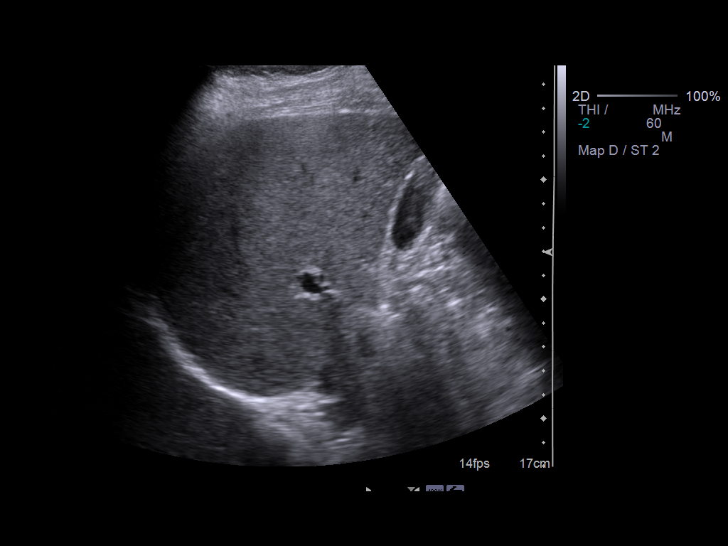
[im 18/71]
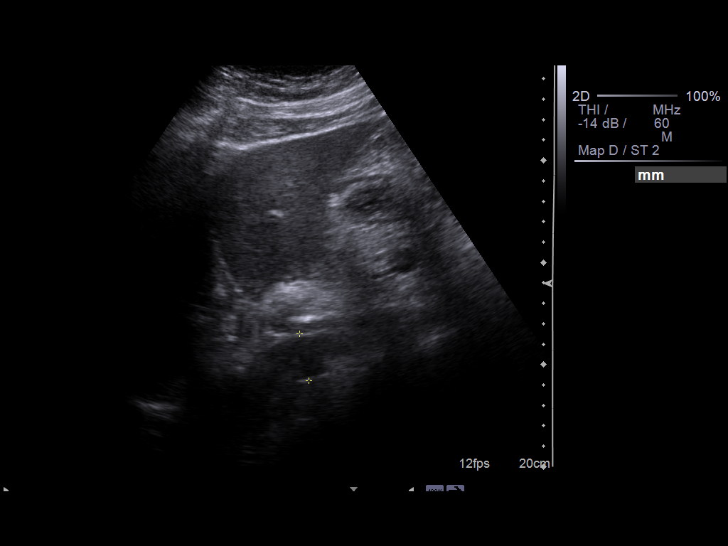
[im 24/71]
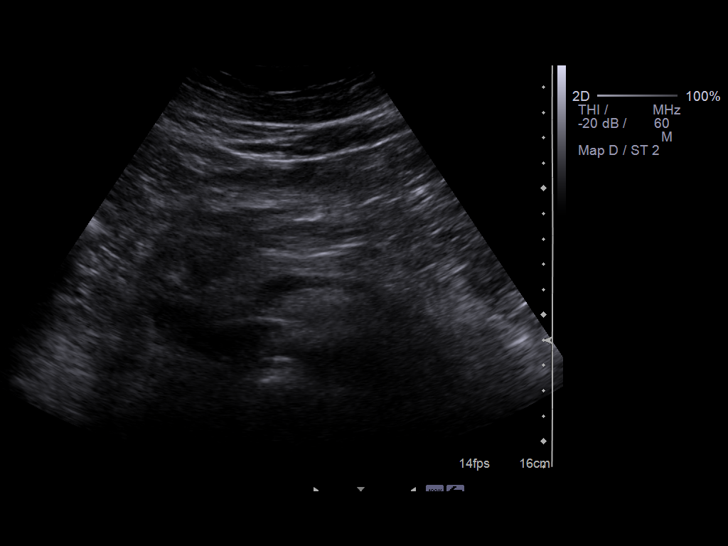
[im 27/71]
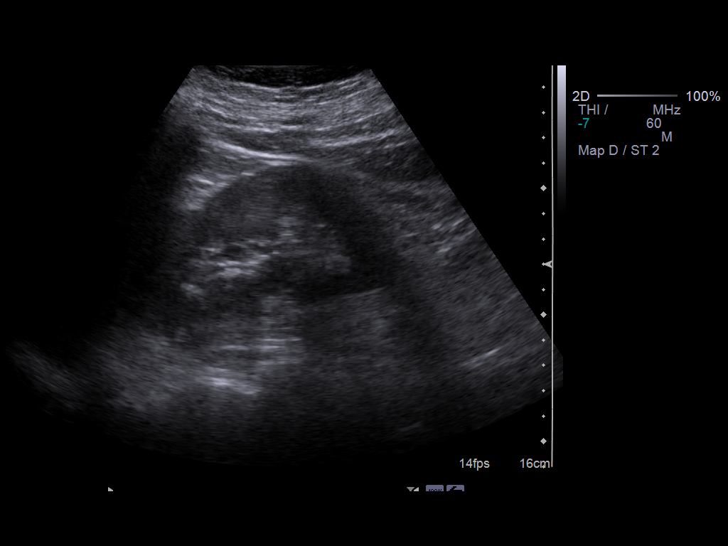
[im 33/71]
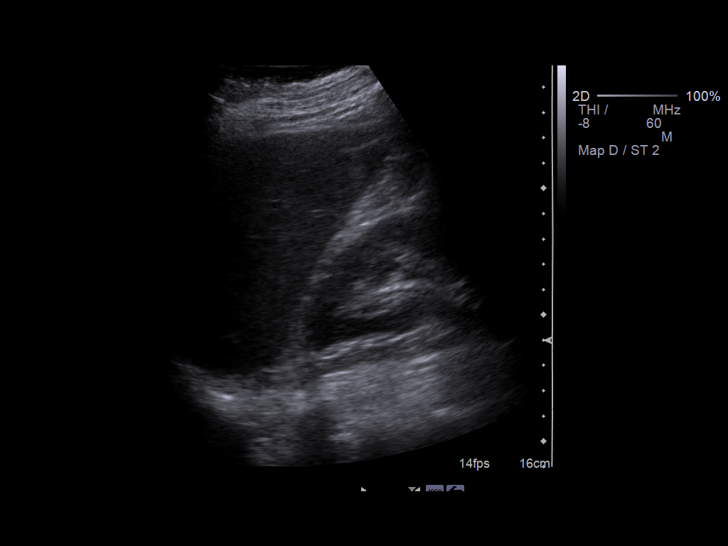
[im 38/71]
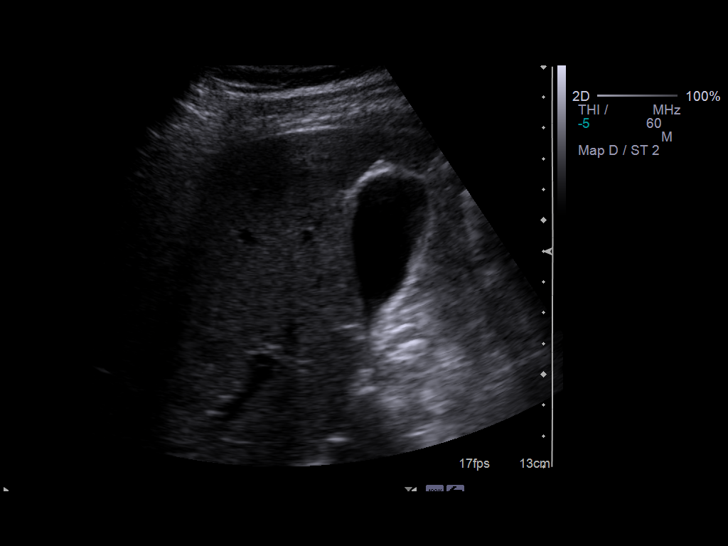
[im 44/71]
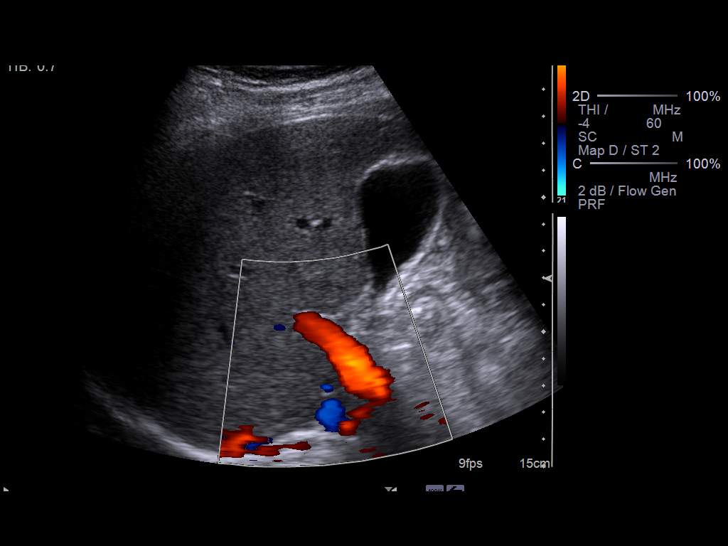
[im 47/71]
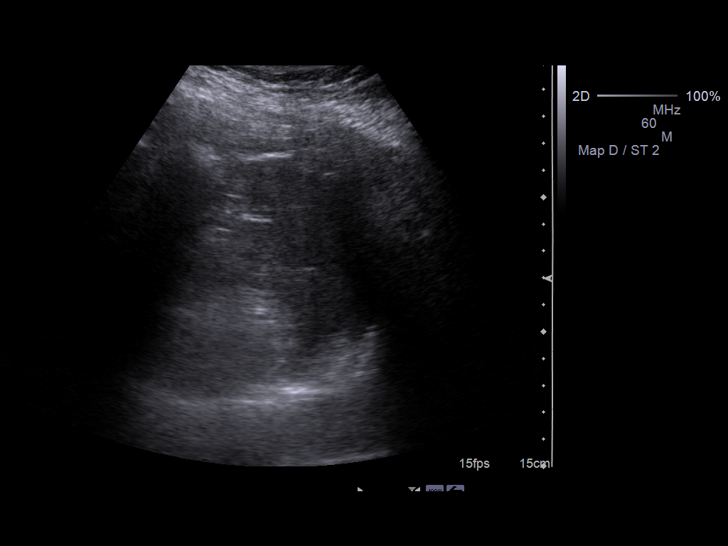
[im 53/71]
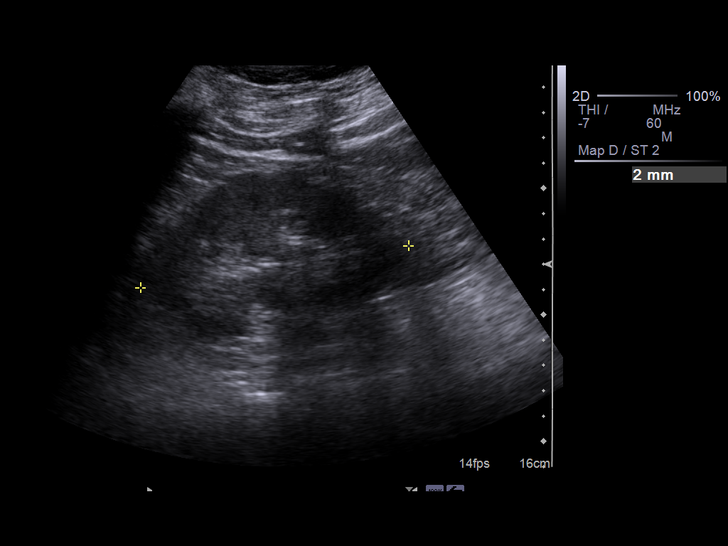
[im 59/71]
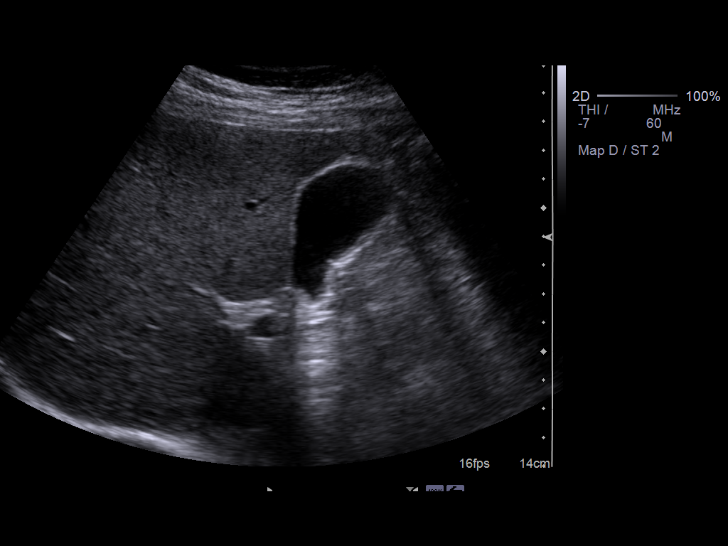
[im 65/71]
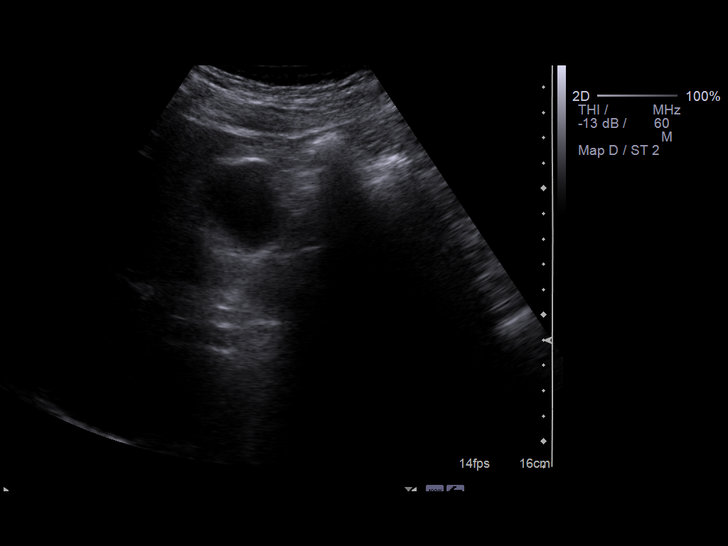
[im 71/71]
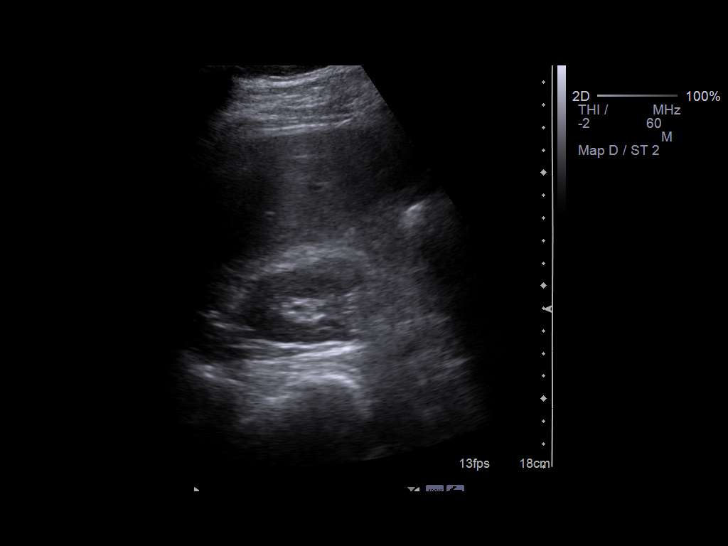

[14 of 25 positions shown; findings below may reference images not displayed]

FINDINGS: Gallbladder:  No gallstones, gallbladder wall thickening, or
pericholecystic fluid.

Common bile duct:  Measures 3.0 mm in AP width and has a normal
appearance

Liver:  No focal lesion identified.  Within normal limits in
parenchymal echogenicity.

IVC:  Appears normal.

Pancreas:  The head and body appear normal in size and echotexture.
The pancreatic tail is incompletely assessed due to shadowing from
overlying gas

Spleen:  Demonstrates a sagittal length of 7.4 mm.  No areas of
focal parenchymal abnormality noted

Right Kidney:  Has a sagittal length of 11.2 cm.  No focal
parenchymal abnormalities or signs of hydronephrosis are noted

Left Kidney:  Demonstrates a sagittal length of 10.9 cm.  No focal
parenchymal abnormalities or signs of hydronephrosis are noted

Abdominal aorta:  Demonstrates a maximal caliber of 2.3 cm. No
signs of focal aneurysm are noted
IMPRESSION: Unremarkable abdominal ultrasound.  No sonographic findings
associated with the liver to suggest acute or chronic changes from
the patient's known hepatitis C

## 2011-05-17 ENCOUNTER — Other Ambulatory Visit: Payer: Self-pay | Admitting: Internal Medicine

## 2011-05-18 ENCOUNTER — Other Ambulatory Visit: Payer: Self-pay

## 2011-05-18 ENCOUNTER — Other Ambulatory Visit: Payer: Self-pay | Admitting: Gastroenterology

## 2011-05-18 MED ORDER — PANTOPRAZOLE SODIUM 40 MG PO TBEC: 40.0000 mg | DELAYED_RELEASE_TABLET | Freq: Every day | ORAL | Status: DC

## 2011-05-18 MED ORDER — CLONAZEPAM 0.5 MG PO TABS: 0.5000 mg | ORAL_TABLET | Freq: Two times a day (BID) | ORAL | Status: DC | PRN

## 2011-05-18 NOTE — Telephone Encounter (Signed)
Faxed hardcopy to pharmacy. 

## 2011-05-18 NOTE — Telephone Encounter (Signed)
Done hardcopy to [facility]bin  

## 2011-05-23 ENCOUNTER — Other Ambulatory Visit: Payer: Self-pay | Admitting: Internal Medicine

## 2011-07-03 ENCOUNTER — Ambulatory Visit: Payer: Medicare HMO | Admitting: Internal Medicine

## 2011-07-20 ENCOUNTER — Ambulatory Visit (INDEPENDENT_AMBULATORY_CARE_PROVIDER_SITE_OTHER): Payer: Medicare HMO | Admitting: Internal Medicine

## 2011-07-20 ENCOUNTER — Encounter: Payer: Self-pay | Admitting: Internal Medicine

## 2011-07-20 VITALS — BP 100/60 | HR 76 | Temp 98.6°F | Ht 70.0 in | Wt 237.2 lb

## 2011-07-20 DIAGNOSIS — J069 Acute upper respiratory infection, unspecified: Secondary | ICD-10-CM

## 2011-07-20 DIAGNOSIS — F411 Generalized anxiety disorder: Secondary | ICD-10-CM

## 2011-07-20 DIAGNOSIS — E119 Type 2 diabetes mellitus without complications: Secondary | ICD-10-CM

## 2011-07-20 MED ORDER — AZITHROMYCIN 250 MG PO TABS: ORAL_TABLET | ORAL | Status: AC

## 2011-07-20 NOTE — Patient Instructions (Signed)
Take all new medications as prescribed Continue all other medications as before  

## 2011-07-22 ENCOUNTER — Encounter: Payer: Self-pay | Admitting: Internal Medicine

## 2011-07-22 NOTE — Assessment & Plan Note (Signed)
stable overall by hx and exam, most recent data reviewed with pt, and pt to continue medical treatment as before  Lab Results  Component Value Date   HGBA1C 5.9 04/06/2011    

## 2011-07-22 NOTE — Assessment & Plan Note (Signed)
Mild to mod, for antibx course,  to f/u any worsening symptoms or concerns 

## 2011-07-22 NOTE — Progress Notes (Signed)
Subjective:    Patient ID: Donald [name], male    DOB: 26-Sep-1955, 56 y.o.   MRN: [id number]  HPI   Here with 3 days acute onset fever, severe ST, pressure, general weakness and malaise,, but little to no cough and Pt denies chest pain, increased sob or doe, wheezing, orthopnea, PND, increased LE swelling, palpitations, dizziness or syncope.  Pt denies new neurological symptoms such as new headache, or facial or extremity weakness or numbness   Pt denies polydipsia, polyuria, or low sugar symptoms such as weakness or confusion improved with po intake.  Pt states overall good compliance with meds, trying to follow lower cholesterol, diabetic diet, wt overall stable but little exercise however.   Denies worsening depressive symptoms, suicidal ideation, or panic, though has ongoing anxiety, not increased recently.  Past Medical History  Diagnosis Date  . Depression   . Hepatitis C   . PTSD (post-traumatic stress disorder)   . Chronic pain syndrome     cervical/lumbar  . Hyperlipidemia   . Erectile dysfunction   . Allergy   . Sleep apnea   . GERD (gastroesophageal reflux disease)   . DJD (degenerative joint disease) of knee   . Anal fissure   . Diabetes mellitus     pt. states he is not diabetic,once told he was borderline   Past Surgical History  Procedure Date  . S/p cervical spine fusion   . H/p knee surgury   . S/p lumbar disc surgury 1984  . Anal fissure repair     reports that he quit smoking about 23 years ago. He does not have any smokeless tobacco history on file. He reports that he does not drink alcohol or use illicit drugs. family history includes Alcohol abuse in his other; Cancer in his brother; Esophageal cancer in his brother; and Hypertension in his other. Allergies  Allergen Reactions  . Duloxetine     REACTION: nausea  . Flexeril (Cyclobenzaprine Hcl)   . Latex     REACTION: rash  . Other     Adhesive tape   Current Outpatient Prescriptions on File Prior to  Visit  Medication Sig Dispense Refill  . buprenorphine-naloxone (SUBOXONE) 8-2 MG SUBL Place under the tongue 2 (two) times daily.        . clonazePAM (KLONOPIN) 0.5 MG tablet Take 1 tablet (0.5 mg total) by mouth 2 (two) times daily as needed for anxiety. 1/2 by mouth every evening  60 tablet  3  . FLONASE 50 MCG/ACT nasal spray USE 2 SPRAYS IN EACH NOSTRIL ONCE DAILY  16 g  11  . Omega 3-6-9 Fatty Acids (RA OMEGA 3-6-9) CAPS Take by mouth 2 (two) times daily.        . pantoprazole (PROTONIX) 40 MG tablet Take 1 tablet (40 mg total) by mouth daily.  30 tablet  5  . VESICARE 10 MG tablet TAKE 1 TABLET ONCE DAILY.  30 each  11  . Probiotic Product (PROBIOTIC ACIDOPHILUS) CAPS Take by mouth daily.        . traMADol (ULTRAM) 50 MG tablet Take 1 tablet (50 mg total) by mouth every 6 (six) hours as needed for pain.  120 tablet  2   Review of Systems Review of Systems  Constitutional: Negative for diaphoresis and unexpected weight change.  HENT: Negative for drooling and tinnitus.   Eyes: Negative for photophobia and visual disturbance.  Respiratory: Negative for choking and stridor.   Gastrointestinal: Negative for vomiting and blood in stool.  Genitourinary: Negative for hematuria and decreased urine volume.    Objective:   Physical Exam BP 100/60  Pulse 76  Temp(Src) 98.6 F (37 C) (Oral)  Ht 5\' 10"  (1.778 m)  Wt 237 lb 4 oz (107.616 kg)  BMI 34.04 kg/m2  SpO2 99% Physical Exam  VS noted, mild ill Constitutional: Pt appears well-developed and well-nourished.  HENT: Head: Normocephalic.  Right Ear: External ear normal.  Left Ear: External ear normal.  Bilat tm's mild erythema.  Sinus nontender.  Pharynx severe erythema Eyes: Conjunctivae and EOM are normal. Pupils are equal, round, and reactive to light.  Neck: Normal range of motion. Neck supple.  Cardiovascular: Normal rate and regular rhythm.   Pulmonary/Chest: Effort normal and breath sounds normal.  Neurological: Pt is alert.  No cranial nerve deficit.  Skin: Skin is warm. No erythema.  Psychiatric: Pt behavior is normal. Thought content normal. 1+ nervous, not depressed affect    Assessment & Plan:

## 2011-07-22 NOTE — Assessment & Plan Note (Signed)
stable overall by hx and exam, most recent data reviewed with pt, and pt to continue medical treatment as before  Lab Results  Component Value Date   WBC 5.8 04/06/2011   HGB 14.8 04/06/2011   HCT 44.3 04/06/2011   PLT 174.0 04/06/2011   GLUCOSE 77 04/06/2011   GLUCOSE 79 04/06/2011   CHOL 154 04/06/2011   CHOL 155 04/06/2011   TRIG 151.0* 04/06/2011   TRIG 152.0* 04/06/2011   HDL 52.00 04/06/2011   HDL 50.10 04/06/2011   LDLCALC 72 04/06/2011   LDLCALC 75 04/06/2011   ALT 45 04/06/2011   AST 50* 04/06/2011   NA 143 04/06/2011   NA 142 04/06/2011   K 4.1 04/06/2011   K 4.1 04/06/2011   CL 105 04/06/2011   CL 105 04/06/2011   CREATININE 1.0 04/06/2011   CREATININE 1.0 04/06/2011   BUN 14 04/06/2011   BUN 14 04/06/2011   CO2 31 04/06/2011   CO2 26 04/06/2011   TSH 0.72 04/06/2011   PSA 0.58 04/06/2011   INR 1.00 10/08/2009   HGBA1C 5.9 04/06/2011   MICROALBUR 0.3 10/11/2010    

## 2011-08-18 ENCOUNTER — Ambulatory Visit (INDEPENDENT_AMBULATORY_CARE_PROVIDER_SITE_OTHER): Payer: Medicare HMO | Admitting: Internal Medicine

## 2011-08-18 ENCOUNTER — Encounter: Payer: Self-pay | Admitting: Internal Medicine

## 2011-08-18 VITALS — BP 112/80 | HR 77 | Temp 98.3°F | Ht 70.0 in | Wt 241.0 lb

## 2011-08-18 DIAGNOSIS — K219 Gastro-esophageal reflux disease without esophagitis: Secondary | ICD-10-CM

## 2011-08-18 DIAGNOSIS — R1013 Epigastric pain: Secondary | ICD-10-CM

## 2011-08-18 DIAGNOSIS — K30 Functional dyspepsia: Secondary | ICD-10-CM

## 2011-08-18 DIAGNOSIS — R079 Chest pain, unspecified: Secondary | ICD-10-CM

## 2011-08-18 DIAGNOSIS — F411 Generalized anxiety disorder: Secondary | ICD-10-CM

## 2011-08-18 DIAGNOSIS — K3189 Other diseases of stomach and duodenum: Secondary | ICD-10-CM

## 2011-08-18 MED ORDER — RANITIDINE HCL 300 MG PO TABS
300.0000 mg | ORAL_TABLET | Freq: Every day | ORAL | Status: AC
Start: 2011-08-18 — End: 2012-08-17

## 2011-08-18 NOTE — Assessment & Plan Note (Addendum)
ECG reviewed as per emr, atypical, very unlikley cardiac,  Declines cxr,  to f/u any worsening symptoms or concerns

## 2011-08-18 NOTE — Patient Instructions (Signed)
Take all new medications as prescribed Continue all other medications as before Please keep your appointments with your specialists as you have planned - pain clinic, and the Hep C clinic

## 2011-08-19 ENCOUNTER — Encounter: Payer: Self-pay | Admitting: Internal Medicine

## 2011-08-19 NOTE — Assessment & Plan Note (Signed)
stable overall by hx and exam, most recent data reviewed with pt, and pt to continue medical treatment as before  Lab Results  Component Value Date   WBC 5.8 04/06/2011   HGB 14.8 04/06/2011   HCT 44.3 04/06/2011   PLT 174.0 04/06/2011   GLUCOSE 77 04/06/2011   GLUCOSE 79 04/06/2011   CHOL 154 04/06/2011   CHOL 155 04/06/2011   TRIG 151.0* 04/06/2011   TRIG 152.0* 04/06/2011   HDL 52.00 04/06/2011   HDL 16.10 04/06/2011   LDLCALC 72 04/06/2011   LDLCALC 75 04/06/2011   ALT 45 04/06/2011   AST 50* 04/06/2011   NA 143 04/06/2011   NA 142 04/06/2011   K 4.1 04/06/2011   K 4.1 04/06/2011   CL 105 04/06/2011   CL 105 04/06/2011   CREATININE 1.0 04/06/2011   CREATININE 1.0 04/06/2011   BUN 14 04/06/2011   BUN 14 04/06/2011   CO2 31 04/06/2011   CO2 26 04/06/2011   TSH 0.72 04/06/2011   PSA 0.58 04/06/2011   INR 1.00 10/08/2009   HGBA1C 5.9 04/06/2011   MICROALBUR 0.3 10/11/2010

## 2011-08-19 NOTE — Progress Notes (Signed)
Subjective:    Patient ID: Donald [name], male    DOB: 05-16-1956, 56 y.o.   MRN: [id number]  HPI  Here with c/o 2 wks onset mild recurring upper and lower mid anterior chest pain, burgning type, intermittent, with sour brash, nonexertional, nonpleuritic, some asssoc with nausea but no diaphoriesis, vomiting, radiation, palps dizziness or syncope.   O/w Pt denies other chest pain, increased sob or doe, wheezing, orthopnea, PND, increased LE swelling.  Pt denies new neurological symptoms such as new headache, or facial or extremity weakness or numbness   Pt denies polydipsia, polyuria, or low sugar symptoms such as weakness or confusion improved with po intake.  Pt states overall good compliance with meds inclduing the PPI daily, trying to follow lower cholesterol, diabetic diet, wt overall stable but little exercise however. Denies worsening depressive symptoms, suicidal ideation, or panic, though has ongoing anxiety, has some disability rating from the [place] for PTSD. Normal EGD noted on chart from April 2012 with Dr Leone Payor Past Medical History  Diagnosis Date  . Depression   . Hepatitis C   . PTSD (post-traumatic stress disorder)   . Chronic pain syndrome     cervical/lumbar  . Hyperlipidemia   . Erectile dysfunction   . Allergy   . Sleep apnea   . GERD (gastroesophageal reflux disease)   . DJD (degenerative joint disease) of knee   . Anal fissure   . Diabetes mellitus     pt. states he is not diabetic,once told he was borderline   Past Surgical History  Procedure Date  . S/p cervical spine fusion   . H/p knee surgury   . S/p lumbar disc surgury 1984  . Anal fissure repair     reports that he quit smoking about 23 years ago. He does not have any smokeless tobacco history on file. He reports that he does not drink alcohol or use illicit drugs. family history includes Alcohol abuse in his other; Cancer in his brother; Esophageal cancer in his brother; and Hypertension in his  other. Allergies  Allergen Reactions  . Duloxetine     REACTION: nausea  . Flexeril (Cyclobenzaprine Hcl)   . Latex     REACTION: rash  . Other     Adhesive tape   Current Outpatient Prescriptions on File Prior to Visit  Medication Sig Dispense Refill  . buprenorphine-naloxone (SUBOXONE) 8-2 MG SUBL Place under the tongue 2 (two) times daily.        . clonazePAM (KLONOPIN) 0.5 MG tablet Take 1 tablet (0.5 mg total) by mouth 2 (two) times daily as needed for anxiety. 1/2 by mouth every evening  60 tablet  3  . FLONASE 50 MCG/ACT nasal spray USE 2 SPRAYS IN EACH NOSTRIL ONCE DAILY  16 g  11  . Omega 3-6-9 Fatty Acids (RA OMEGA 3-6-9) CAPS Take by mouth 2 (two) times daily.        . pantoprazole (PROTONIX) 40 MG tablet Take 1 tablet (40 mg total) by mouth daily.  30 tablet  5  . Probiotic Product (PROBIOTIC ACIDOPHILUS) CAPS Take by mouth daily.         Review of Systems Review of Systems  Constitutional: Negative for diaphoresis and unexpected weight change.  HENT: Negative for drooling and tinnitus.   Eyes: Negative for photophobia and visual disturbance.  Respiratory: Negative for choking and stridor.   Gastrointestinal: Negative for vomiting and blood in stool.  Genitourinary: Negative for hematuria and decreased urine volume.  Objective:   Physical Exam BP 112/80  Pulse 77  Temp(Src) 98.3 F (36.8 C) (Oral)  Ht 5\' 10"  (1.778 m)  Wt 241 lb (109.317 kg)  BMI 34.58 kg/m2  SpO2 95% Physical Exam  VS noted, obese, not ill appearing Constitutional: Pt appears well-developed and well-nourished.  HENT: Head: Normocephalic.  Right Ear: External ear normal.  Left Ear: External ear normal.  Eyes: Conjunctivae and EOM are normal. Pupils are equal, round, and reactive to light.  Neck: Normal range of motion. Neck supple.  Cardiovascular: Normal rate and regular rhythm.   Pulmonary/Chest: Effort normal and breath sounds normal.  Abd:  Soft, NT, non-distended, +  BS Neurological: Pt is alert. No cranial nerve deficit.  Skin: Skin is warm. No erythema.  Psychiatric: Pt behavior is normal. Thought content normal.     Assessment & Plan:

## 2011-08-19 NOTE — Assessment & Plan Note (Signed)
Mild to mod uncontrolled, for better antireflux diet, add zantac 300 qhs,  to f/u any worsening symptoms or concerns

## 2011-08-25 ENCOUNTER — Other Ambulatory Visit: Payer: Self-pay | Admitting: Otolaryngology

## 2011-08-29 ENCOUNTER — Encounter (HOSPITAL_BASED_OUTPATIENT_CLINIC_OR_DEPARTMENT_OTHER): Payer: Self-pay | Admitting: *Deleted

## 2011-08-30 ENCOUNTER — Encounter (HOSPITAL_BASED_OUTPATIENT_CLINIC_OR_DEPARTMENT_OTHER): Payer: Self-pay | Admitting: *Deleted

## 2011-08-31 ENCOUNTER — Encounter (HOSPITAL_BASED_OUTPATIENT_CLINIC_OR_DEPARTMENT_OTHER): Payer: Self-pay | Admitting: *Deleted

## 2011-08-31 NOTE — Progress Notes (Signed)
NPO AFTER MN WITH EXCEPTION CLEAR LIQUIDS UNTIL 0830. (NO CREAM/ MILK PRODUCTS) ARRIVES AT 1230. NEEDS ISTAT. CURRENT EKG IN EPIC AND LAST HEPATIC PANEL IN EPIC.  WILL TAKE PROTONIX AM OF SURG. W/ SIP OF WATER.

## 2011-10-03 ENCOUNTER — Encounter (HOSPITAL_BASED_OUTPATIENT_CLINIC_OR_DEPARTMENT_OTHER): Payer: Self-pay | Admitting: *Deleted

## 2011-10-03 NOTE — Progress Notes (Signed)
NPO AFTER MN WITH EXCEPTION CLEAR LIQUIDS UNTIL 0700. (NO CREAM/ MILD PRODUCTS) ARRIVES AT 1100. NEEDS ISTAT AND WILL TAKE PROTONIX W/ SIP OF WATER.

## 2011-10-06 ENCOUNTER — Encounter (HOSPITAL_BASED_OUTPATIENT_CLINIC_OR_DEPARTMENT_OTHER)
Admission: RE | Disposition: A | Discharge status: Home / Self Care | Payer: Self-pay | Source: Ambulatory Visit | Attending: Specialist

## 2011-10-06 ENCOUNTER — Ambulatory Visit (HOSPITAL_BASED_OUTPATIENT_CLINIC_OR_DEPARTMENT_OTHER)
Admission: RE | Admit: 2011-10-06 | Discharge: 2011-10-06 | Disposition: A | Discharge status: Home / Self Care | Payer: Medicare HMO | Source: Ambulatory Visit | Attending: Specialist | Admitting: Specialist

## 2011-10-06 ENCOUNTER — Ambulatory Visit (HOSPITAL_BASED_OUTPATIENT_CLINIC_OR_DEPARTMENT_OTHER): Payer: Medicare HMO | Admitting: Anesthesiology

## 2011-10-06 ENCOUNTER — Encounter (HOSPITAL_BASED_OUTPATIENT_CLINIC_OR_DEPARTMENT_OTHER): Payer: Self-pay | Admitting: Anesthesiology

## 2011-10-06 ENCOUNTER — Encounter (HOSPITAL_BASED_OUTPATIENT_CLINIC_OR_DEPARTMENT_OTHER): Payer: Self-pay

## 2011-10-06 DIAGNOSIS — K219 Gastro-esophageal reflux disease without esophagitis: Secondary | ICD-10-CM | POA: Insufficient documentation

## 2011-10-06 DIAGNOSIS — X58XXXA Exposure to other specified factors, initial encounter: Secondary | ICD-10-CM | POA: Insufficient documentation

## 2011-10-06 DIAGNOSIS — Z79899 Other long term (current) drug therapy: Secondary | ICD-10-CM | POA: Insufficient documentation

## 2011-10-06 DIAGNOSIS — M2240 Chondromalacia patellae, unspecified knee: Secondary | ICD-10-CM | POA: Insufficient documentation

## 2011-10-06 DIAGNOSIS — E785 Hyperlipidemia, unspecified: Secondary | ICD-10-CM | POA: Insufficient documentation

## 2011-10-06 DIAGNOSIS — Z7982 Long term (current) use of aspirin: Secondary | ICD-10-CM | POA: Insufficient documentation

## 2011-10-06 DIAGNOSIS — G4733 Obstructive sleep apnea (adult) (pediatric): Secondary | ICD-10-CM | POA: Insufficient documentation

## 2011-10-06 DIAGNOSIS — IMO0002 Reserved for concepts with insufficient information to code with codable children: Secondary | ICD-10-CM | POA: Insufficient documentation

## 2011-10-06 HISTORY — DX: Encounter for other specified aftercare: Z51.89

## 2011-10-06 HISTORY — DX: Neuromuscular dysfunction of bladder, unspecified: N31.9

## 2011-10-06 HISTORY — DX: Effusion, left knee: M25.462

## 2011-10-06 HISTORY — DX: Sprain of other specified parts of unspecified knee, initial encounter: S83.8X9A

## 2011-10-06 HISTORY — DX: Prediabetes: R73.03

## 2011-10-06 HISTORY — DX: Obstructive sleep apnea (adult) (pediatric): G47.33

## 2011-10-06 HISTORY — DX: Personal history of other infectious and parasitic diseases: Z86.19

## 2011-10-06 HISTORY — DX: Other chronic pain: G89.29

## 2011-10-06 HISTORY — DX: Reserved for inherently not codable concepts without codable children: IMO0001

## 2011-10-06 HISTORY — DX: Dorsalgia, unspecified: M54.9

## 2011-10-06 LAB — POCT I-STAT 4, (NA,K, GLUC, HGB,HCT)
Glucose, Bld: 125 mg/dL — ABNORMAL HIGH (ref 70–99)
Potassium: 4.4 mEq/L (ref 3.5–5.1)
Sodium: 143 mEq/L (ref 135–145)

## 2011-10-06 SURGERY — ARTHROSCOPY, KNEE, WITH MEDIAL MENISCECTOMY
Anesthesia: General | Site: Knee | Laterality: Left | Wound class: Clean

## 2011-10-06 MED ORDER — SODIUM CHLORIDE 0.9 % IR SOLN: Status: DC | PRN

## 2011-10-06 MED ORDER — SODIUM CHLORIDE 0.9 % IR SOLN
Status: DC | PRN
  Administered 2011-10-06: 14:00:00

## 2011-10-06 MED ORDER — OXYCODONE-ACETAMINOPHEN 5-325 MG PO TABS
1.0000 | ORAL_TABLET | ORAL | Status: DC | PRN
  Administered 2011-10-06: 1 via ORAL

## 2011-10-06 MED ORDER — CHLORHEXIDINE GLUCONATE 4 % EX LIQD: Freq: Once | CUTANEOUS | Status: DC

## 2011-10-06 MED ORDER — CEFAZOLIN SODIUM-DEXTROSE 2-3 GM-% IV SOLR: 2.0000 g | INTRAVENOUS | Status: DC

## 2011-10-06 MED ORDER — LIDOCAINE HCL (CARDIAC) 20 MG/ML IV SOLN
INTRAVENOUS | Status: DC | PRN
  Administered 2011-10-06: 80 mg via INTRAVENOUS

## 2011-10-06 MED ORDER — LACTATED RINGERS IV SOLN
INTRAVENOUS | Status: DC
  Administered 2011-10-06: 12:00:00 via INTRAVENOUS

## 2011-10-06 MED ORDER — ONDANSETRON HCL 4 MG/2ML IJ SOLN
INTRAMUSCULAR | Status: DC | PRN
  Administered 2011-10-06: 4 mg via INTRAVENOUS

## 2011-10-06 MED ORDER — PROPOFOL 10 MG/ML IV EMUL
INTRAVENOUS | Status: DC | PRN
  Administered 2011-10-06: 250 mg via INTRAVENOUS

## 2011-10-06 MED ORDER — OXYCODONE HCL 5 MG PO TABS
2.5000 mg | ORAL_TABLET | ORAL | Status: DC | PRN
  Administered 2011-10-06: 2.5 mg via ORAL

## 2011-10-06 MED ORDER — FENTANYL CITRATE 0.05 MG/ML IJ SOLN
25.0000 ug | INTRAMUSCULAR | Status: DC | PRN
  Administered 2011-10-06 (×2): 25 ug via INTRAVENOUS

## 2011-10-06 MED ORDER — BUPIVACAINE HCL 0.5 % IJ SOLN
INTRAMUSCULAR | Status: DC | PRN
  Administered 2011-10-06: 22 mL

## 2011-10-06 MED ORDER — CEFAZOLIN SODIUM-DEXTROSE 2-3 GM-% IV SOLR
2.0000 g | Freq: Once | INTRAVENOUS | Status: AC
  Administered 2011-10-06: 2 g via INTRAVENOUS

## 2011-10-06 MED ORDER — FENTANYL CITRATE 0.05 MG/ML IJ SOLN
INTRAMUSCULAR | Status: DC | PRN
  Administered 2011-10-06: 100 ug via INTRAVENOUS
  Administered 2011-10-06 (×2): 50 ug via INTRAVENOUS

## 2011-10-06 MED ORDER — OXYCODONE-ACETAMINOPHEN 7.5-325 MG PO TABS: 1.0000 | ORAL_TABLET | ORAL | Status: AC | PRN

## 2011-10-06 MED ORDER — CHLORHEXIDINE GLUCONATE 4 % EX LIQD
60.0000 mL | Freq: Once | CUTANEOUS | Status: DC
Start: 2011-10-06 — End: 2011-10-06

## 2011-10-06 SURGICAL SUPPLY — 45 items
BANDAGE ELASTIC 6 VELCRO ST LF (GAUZE/BANDAGES/DRESSINGS) ×3 IMPLANT
BLADE 4.2CUDA (BLADE) IMPLANT
BLADE CUDA SHAVER 3.5 (BLADE) ×3 IMPLANT
BLADE GREAT WHITE 4.2 (BLADE) IMPLANT
BOOTIES KNEE HIGH SLOAN (MISCELLANEOUS) ×3 IMPLANT
CANISTER SUCT LVC 12 LTR MEDI- (MISCELLANEOUS) ×3 IMPLANT
CANISTER SUCTION 1200CC (MISCELLANEOUS) IMPLANT
CANISTER SUCTION 2500CC (MISCELLANEOUS) IMPLANT
CANNULA ACUFLEX KIT 5X76 (CANNULA) IMPLANT
CLOTH BEACON ORANGE TIMEOUT ST (SAFETY) ×3 IMPLANT
CUTTER MENISCUS  4.2MM (BLADE)
CUTTER MENISCUS 4.2MM (BLADE) IMPLANT
DRAPE ARTHROSCOPY W/POUCH 114 (DRAPES) ×3 IMPLANT
DRSG PAD ABDOMINAL 8X10 ST (GAUZE/BANDAGES/DRESSINGS) ×3 IMPLANT
DURAPREP 26ML APPLICATOR (WOUND CARE) ×3 IMPLANT
ELECT REM PT RETURN 9FT ADLT (ELECTROSURGICAL)
ELECTRODE REM PT RTRN 9FT ADLT (ELECTROSURGICAL) IMPLANT
GLOVE ECLIPSE 6.5 STRL STRAW (GLOVE) IMPLANT
GLOVE INDICATOR 7.0 STRL GRN (GLOVE) ×3 IMPLANT
GLOVE SKINSENSE NS SZ7.0 (GLOVE) ×1
GLOVE SKINSENSE NS SZ8.0 LF (GLOVE) ×1
GLOVE SKINSENSE STRL SZ7.0 (GLOVE) ×2 IMPLANT
GLOVE SKINSENSE STRL SZ8.0 LF (GLOVE) ×2 IMPLANT
GLOVE SURG SS PI 8.0 STRL IVOR (GLOVE) ×3 IMPLANT
GOWN PREVENTION PLUS LG XLONG (DISPOSABLE) ×3 IMPLANT
GOWN STRL REIN XL XLG (GOWN DISPOSABLE) ×3 IMPLANT
IV NS IRRIG 3000ML ARTHROMATIC (IV SOLUTION) ×6 IMPLANT
KNEE WRAP E Z 3 GEL PACK (MISCELLANEOUS) ×3 IMPLANT
MINI VAC (SURGICAL WAND) IMPLANT
NDL SAFETY ECLIPSE 18X1.5 (NEEDLE) ×4 IMPLANT
NEEDLE FILTER BLUNT 18X 1/2SAF (NEEDLE) ×1
NEEDLE FILTER BLUNT 18X1 1/2 (NEEDLE) ×2 IMPLANT
NEEDLE HYPO 18GX1.5 SHARP (NEEDLE) ×2
PACK ARTHROSCOPY DSU (CUSTOM PROCEDURE TRAY) ×3 IMPLANT
PACK BASIN DAY SURGERY FS (CUSTOM PROCEDURE TRAY) ×3 IMPLANT
PADDING CAST COTTON 6X4 STRL (CAST SUPPLIES) ×3 IMPLANT
SET ARTHROSCOPY TUBING (MISCELLANEOUS) ×1
SET ARTHROSCOPY TUBING LN (MISCELLANEOUS) ×2 IMPLANT
SPONGE GAUZE 4X4 12PLY (GAUZE/BANDAGES/DRESSINGS) ×3 IMPLANT
SUT ETHILON 4 0 PS 2 18 (SUTURE) ×3 IMPLANT
SYR 30ML LL (SYRINGE) ×3 IMPLANT
SYRINGE 10CC LL (SYRINGE) ×3 IMPLANT
TOWEL OR 17X24 6PK STRL BLUE (TOWEL DISPOSABLE) ×3 IMPLANT
WAND 90 DEG TURBOVAC W/CORD (SURGICAL WAND) ×3 IMPLANT
WATER STERILE IRR 500ML POUR (IV SOLUTION) ×3 IMPLANT

## 2011-10-06 NOTE — Discharge Instructions (Signed)
Arthroscopic Procedure, Knee An arthroscopic procedure can find what is wrong with your knee. PROCEDURE Arthroscopy is a surgical technique that allows your orthopedic surgeon to diagnose and treat your knee injury with accuracy. They will look into your knee through a small instrument. This is almost like a small (pencil sized) telescope. Because arthroscopy affects your knee less than open knee surgery, you can anticipate a more rapid recovery. Taking an active role by following your caregiver's instructions will help with rapid and complete recovery. Use crutches, rest, elevation, ice, and knee exercises as instructed. The length of recovery depends on various factors including type of injury, age, physical condition, medical conditions, and your rehabilitation. Your knee is the joint between the large bones (femur and tibia) in your leg. Cartilage covers these bone ends which are smooth and slippery and allow your knee to bend and move smoothly. Two menisci, thick, semi-lunar shaped pads of cartilage which form a rim inside the joint, help absorb shock and stabilize your knee. Ligaments bind the bones together and support your knee joint. Muscles move the joint, help support your knee, and take stress off the joint itself. Because of this all programs and physical therapy to rehabilitate an injured or repaired knee require rebuilding and strengthening your muscles. AFTER THE PROCEDURE  After the procedure, you will be moved to a recovery area until most of the effects of the medication have worn off. Your caregiver will discuss the test results with you.   Only take over-the-counter or prescription medicines for pain, discomfort, or fever as directed by your caregiver.  SEEK MEDICAL CARE IF:   You have increased bleeding from your wounds.   You see redness, swelling, or have increasing pain in your wounds.   You have pus coming from your wound.   You have an oral temperature above 102 F (38.9  C).   You notice a bad smell coming from the wound or dressing.   You have severe pain with any motion of your knee.  SEEK IMMEDIATE MEDICAL CARE IF:   You develop a rash.   You have difficulty breathing.   You have any allergic problems.  Document Released: 05/19/2000 Document Revised: 05/11/2011 Document Reviewed: 12/11/2007 [facility] Patient Information 2012 [place], [place].  Exercises per sheet. Crutches weight bearing as tolerated.Change dressing in two days    ARTHROSCOPIC KNEE SURGERY HOME CARE INSTRUCTIONS   PAIN You will be expected to have a moderate amount of pain in the affected knee for approximately two weeks.  However, the first two to four days will be the most severe in terms of the pain you will experience.  Prescriptions have been provided for you to take as needed for the pain.  The pain can be markedly reduced by using the ice/compressive bandage given.  Exchange the ice packs whenever they thaw.  During the night, keep the bandage on because it will still provide some compression for the swelling.  Also, keep the leg elevated on pillows above your heart, and this will help alleviate the pain and swelling.  ACTIVITY It is preferred that you stay on bedrest for approximately 24 hours.  However, you may go to the bathroom with help.  After this, you can start to be up and about progressively more.  Remember that the swelling may still increase after three to four days if you are up and doing too much.  You may put as much weight on the affected leg as pain will allow.  Use your  crutches for comfort and safety.  However, as soon as you are able, you may discard the crutches and go without them.   DRESSING Keep the current dressing as dry as possible.  Two days after your surgery, you may remove the ice/compressive wrap, and surgical dressing.  You may now take a shower, but do not scrub the sounds directly with soap.  Let water rinse over these and gently wipe with your  hand.  Reapply band-aids over the puncture wounds and more gauze if needed.  A slight amount of thin drainage can be normal at this time, and do not let it frighten you.  Reapply the ice/compressive wrap.  You may now repeat this every day each time you shower.  SYMPTOMS TO REPORT TO YOUR DOCTOR  -Extreme pain.  -Extreme swelling.  -Temperature above 101 degrees that does not come down with acetaminophen     (Tylenol).  -Any changes in the feeling, color or movement of your toes.  -Extreme redness, heat, swelling or drainage at your incision  EXERCISE It is preferred that you begin to exercise on the day of your surgery.  Straight leg raises and short arc quads should be begun the afternoon or evening of surgery and continued until you come back for your follow-up appointment.   Attached is an instruction sheet on how to perform these two simple exercises.  Do these at least three times per day if not more.  You may bend your knee as much as is comfortable.  The puncture wounds may occasionally be slightly uncomfortable with bending of the knee.  Do not let this frighten you.  It is important to keep your knee motion, but do not overdo it.  If you have significant pain, simply do not bend the knee as far.   You will be given more exercises to perform at your first return visit.    RETURN APPOINTMENT Please make an appointment to be seen by your doctor in *** days from your surgery.  Patient Signature:  ________________________________________________________  Nurse's Signature:  ________________________________________________________  Post Anesthesia Home Care Instructions  Activity: Get plenty of rest for the remainder of the day. A responsible adult should stay with you for 24 hours following the procedure.  For the next 24 hours, DO NOT: -Drive a car -Advertising copywriter -Drink alcoholic beverages -Take any medication unless instructed by your physician -Make any legal decisions or sign  important papers.  Meals: Start with liquid foods such as gelatin or soup. Progress to regular foods as tolerated. Avoid greasy, spicy, heavy foods. If nausea and/or vomiting occur, drink only clear liquids until the nausea and/or vomiting subsides. Call your physician if vomiting continues.  Special Instructions/Symptoms: Your throat may feel dry or sore from the anesthesia or the breathing tube placed in your throat during surgery. If this causes discomfort, gargle with warm salt water. The discomfort should disappear within 24 hours.  Marland Kitchen

## 2011-10-06 NOTE — Anesthesia Procedure Notes (Signed)
Procedure Name: LMA Insertion Date/Time: 10/06/2011 1:41 PM Performed by: Fran Lowes Pre-anesthesia Checklist: Patient identified, Emergency Drugs available, Suction available and Patient being monitored Patient Re-evaluated:Patient Re-evaluated prior to inductionOxygen Delivery Method: Circle System Utilized Preoxygenation: Pre-oxygenation with 100% oxygen Intubation Type: IV induction Ventilation: Mask ventilation without difficulty LMA: LMA inserted LMA Size: 4.0 Number of attempts: 1 Airway Equipment and Method: bite block Placement Confirmation: positive ETCO2 Tube secured with: Tape Dental Injury: Teeth and Oropharynx as per pre-operative assessment

## 2011-10-06 NOTE — Anesthesia Preprocedure Evaluation (Signed)
Anesthesia Evaluation  Patient identified by MRN, date of birth, ID band Patient awake  General Assessment Comment:Infection precautions  Reviewed: Allergy & Precautions, H&P , NPO status , Patient's Chart, lab work & pertinent test results, reviewed documented beta blocker date and time   Airway Mallampati: II TM Distance: >3 FB Neck ROM: Full    Dental  (+) Teeth Intact and Dental Advisory Given   Pulmonary  Denies OSA, went thru sleep study, CPAP not indicated breath sounds clear to auscultation        Cardiovascular negative cardio ROS  Rhythm:Regular Rate:Normal  Denies cardiac symptoms   Neuro/Psych negative neurological ROS  negative psych ROS   GI/Hepatic GERD-  Medicated,(+) Hepatitis -, 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   Endo/Other  Pt denies DM, diet and exercise corrected any blood sugar elevation  Renal/GU negative Renal ROS  negative genitourinary   Musculoskeletal   Abdominal   Peds negative pediatric ROS (+)  Hematology negative hematology ROS (+)   Anesthesia Other Findings   Reproductive/Obstetrics negative OB ROS                           Anesthesia Physical Anesthesia Plan  ASA: II  Anesthesia Plan: General   Post-op Pain Management:    Induction: Intravenous  Airway Management Planned: LMA  Additional Equipment:   Intra-op Plan:   Post-operative Plan: Extubation in OR  Informed Consent:   Dental advisory given  Plan Discussed with: CRNA and Surgeon  Anesthesia Plan Comments:         Anesthesia Quick Evaluation

## 2011-10-06 NOTE — Anesthesia Postprocedure Evaluation (Signed)
  Anesthesia Post-op Note  Patient: Donald [name]  Procedure(s) Performed: Procedure(s) (LRB): KNEE ARTHROSCOPY WITH MEDIAL MENISECTOMY (Left)  Patient Location: PACU  Anesthesia Type: General  Level of Consciousness: awake and alert   Airway and Oxygen Therapy: Patient Spontanous Breathing  Post-op Pain: mild  Post-op Assessment: Post-op Vital signs reviewed, Patient's Cardiovascular Status Stable, Respiratory Function Stable, Patent Airway and No signs of Nausea or vomiting  Post-op Vital Signs: stable  Complications: No apparent anesthesia complications

## 2011-10-06 NOTE — Transfer of Care (Signed)
Immediate Anesthesia Transfer of Care Note  Patient: Donald [name]  Procedure(s) Performed: Procedure(s) (LRB): KNEE ARTHROSCOPY WITH MEDIAL MENISECTOMY (Left)  Patient Location: Patient transported to PACU with oxygen via face mask at 4 Liters / Min  Anesthesia Type: General  Level of Consciousness: awake and alert   Airway & Oxygen Therapy: Patient Spontanous Breathing and Patient connected to face mask oxygen  Post-op Assessment: Report given to PACU RN and Post -op Vital signs reviewed and stable  Post vital signs: Reviewed and stable  Dentition: Teeth and oropharynx remain in pre-op condition  Complications: No apparent anesthesia complications
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
## 2011-10-06 NOTE — Brief Op Note (Signed)
10/06/2011  2:33 PM  PATIENT:  Donald [name]  56 y.o. male  PRE-OPERATIVE DIAGNOSIS:  LEFT KNEE MEDIAL MENISCAL TEAR  POST-OPERATIVE DIAGNOSIS:  LEFT KNEE MEDIAL MENISCAL TEAR  PROCEDURE:  Procedure(s) (LRB): KNEE ARTHROSCOPY WITH MEDIAL MENISECTOMY (Left)  SURGEON:  Surgeon(s) and Role:    * Javier Docker, MD - Primary  PHYSICIAN ASSISTANT:   ASSISTANTS: none   ANESTHESIA:   general  EBL:     BLOOD ADMINISTERED:none  DRAINS: none   LOCAL MEDICATIONS USED:  MARCAINE     SPECIMEN:  No Specimen  DISPOSITION OF SPECIMEN:  N/A  COUNTS:  YES  TOURNIQUET:  * No tourniquets in log *  DICTATION: .Other Dictation: Dictation Number unable to hear dication number  PLAN OF CARE: Discharge to home after PACU  PATIENT DISPOSITION:  PACU - hemodynamically stable.   Delay start of Pharmacological VTE agent (>24hrs) due to surgical blood loss or risk of bleeding: no

## 2011-10-07 NOTE — Op Note (Signed)
NAME:  Donald [name], Donald [name]                     ACCOUNT NO.:  MEDICAL RECORD NO.:  [id number]  LOCATION:                                 FACILITY:  PHYSICIAN:  Jene Every, M.D.         DATE OF BIRTH:  DATE OF PROCEDURE:  10/06/2011 DATE OF DISCHARGE:                              OPERATIVE REPORT   PREOPERATIVE DIAGNOSIS:  Medial meniscus tear, left knee.  POSTOPERATIVE DIAGNOSES: 1. Medial meniscus tear, left knee. 2. Grade 3 chondromalacia, lateral femoral condyle.  PROCEDURES PERFORMED: 1. Left knee arthroscopy with partial medial meniscectomy. 2. Chondroplasty of lateral femoral condyle.  ANESTHESIA:  General.  ASSISTANT:  None.  HISTORY:  A 56 year old with history of knee arthritis, __________ status post trauma, persistent mechanical symptoms, locking, popping, giving way with meniscus tear noted on MRI and despite conservative treatment, he had continued mechanical symptoms, medial joint line tenderness, and indicated for arthroscopic evaluation and partial meniscectomy.  Risk and benefits discussed including bleeding, infection, damage to neurovascular structures, no change in symptoms, worsening symptoms, need for repeat debridement, DVT, PE, anesthetic complications, etc.  TECHNIQUE:  With the patient in supine position after induction of adequate anesthesia with 2 g Kefzol, left lower extremity was prepped and draped in usual sterile fashion.  A lateral parapatellar portal was fashioned with a #11 blade.  Ingress cannula was atraumatically placed. Irrigant was utilized to insufflate the joint.  Under direct visualization, medial parapatellar portal was fashioned with a #11 blade, after localization with an 18-gauge needle sparing the medial meniscus.  Noted immediately was medial meniscus tear, midportion displaced flap tear.  Previous resection had been noted.  This was extending to the junction of the middle and outer third.  Introduced a Runner, broadcasting/film/video and resected it  to a stable base, further contoured with an [employer].  We cauterized the __________.  Minor chondral changes noted of the femoral condyle and tibial plateau.  The remnant meniscus was stable to probe palpation.  ACL was unremarkable.  Lateral compartment revealed a grade 3 chondral lesion of the femoral condyle, About 2 x 1 cm.  Light chondroplasty was performed.  Minor grade 2 changes of the tibial plateau.  Meniscus was stable to probe palpation without displaced tear. Suprapatellar pouch revealed some minor grade 2 changes of the patella. Normal patellofemoral tracking.  Gutters were all unremarkable. Reexamined all compartments.  No further pathology amenable to arthroscopic intervention.  Therefore, removed all instrumentation. Portals were closed with 4-0 nylon simple sutures, 0.25% Marcaine with epinephrine was infiltrated in the joint.  Wound was dressed sterilely without difficulty and transported to the Recovery in satisfactory condition.  The patient tolerated procedure well.  No complications.  No blood loss.     Jene Every, M.D.     Cordelia Pen  D:  10/06/2011  T:  10/07/2011  Job:  [id number]

## 2011-10-18 ENCOUNTER — Ambulatory Visit: Payer: Medicare HMO | Admitting: Internal Medicine

## 2011-10-18 ENCOUNTER — Ambulatory Visit (INDEPENDENT_AMBULATORY_CARE_PROVIDER_SITE_OTHER): Payer: Medicare HMO | Admitting: Internal Medicine

## 2011-10-18 ENCOUNTER — Encounter: Payer: Self-pay | Admitting: Internal Medicine

## 2011-10-18 VITALS — BP 102/80 | HR 74 | Temp 97.3°F | Ht 70.0 in | Wt 238.1 lb

## 2011-10-18 DIAGNOSIS — E785 Hyperlipidemia, unspecified: Secondary | ICD-10-CM

## 2011-10-18 DIAGNOSIS — Z0000 Encounter for general adult medical examination without abnormal findings: Secondary | ICD-10-CM

## 2011-10-18 DIAGNOSIS — E119 Type 2 diabetes mellitus without complications: Secondary | ICD-10-CM

## 2011-10-18 DIAGNOSIS — J309 Allergic rhinitis, unspecified: Secondary | ICD-10-CM

## 2011-10-18 DIAGNOSIS — F528 Other sexual dysfunction not due to a substance or known physiological condition: Secondary | ICD-10-CM

## 2011-10-18 MED ORDER — FLUTICASONE PROPIONATE 50 MCG/ACT NA SUSP: 2.0000 | Freq: Every day | NASAL | Status: DC

## 2011-10-18 MED ORDER — FEXOFENADINE HCL 180 MG PO TABS: 180.0000 mg | ORAL_TABLET | Freq: Every day | ORAL | Status: DC

## 2011-10-18 MED ORDER — METHYLPREDNISOLONE ACETATE 80 MG/ML IJ SUSP
120.0000 mg | Freq: Once | INTRAMUSCULAR | Status: AC
  Administered 2011-10-18: 120 mg via INTRAMUSCULAR

## 2011-10-18 MED ORDER — VARDENAFIL HCL 20 MG PO TABS: 20.0000 mg | ORAL_TABLET | ORAL | Status: DC | PRN

## 2011-10-18 NOTE — Patient Instructions (Addendum)
You had the steroid shot today Take all new medications as prescribed - the allegra as needed for allergies Continue all other medications as before - including the levitra (new prescription today), and flonase Please have the pharmacy call with any refills you may need. Please keep your appointments with your specialists as you have planned, including the Hep C clinic later this month Please go to LAB in the Basement for the blood and/or urine tests to be done today You will be contacted by phone if any changes need to be made immediately.  Otherwise, you will receive a letter about your results with an explanation. You are given the signed handicap parking form today Please return in 6 mo with Lab testing done 3-5 days before

## 2011-10-20 ENCOUNTER — Encounter: Payer: Self-pay | Admitting: Internal Medicine

## 2011-10-20 ENCOUNTER — Other Ambulatory Visit (INDEPENDENT_AMBULATORY_CARE_PROVIDER_SITE_OTHER): Payer: Medicare HMO

## 2011-10-20 DIAGNOSIS — R5383 Other fatigue: Secondary | ICD-10-CM

## 2011-10-20 DIAGNOSIS — IMO0001 Reserved for inherently not codable concepts without codable children: Secondary | ICD-10-CM

## 2011-10-20 DIAGNOSIS — E119 Type 2 diabetes mellitus without complications: Secondary | ICD-10-CM

## 2011-10-20 LAB — BASIC METABOLIC PANEL
CO2: 31 mEq/L (ref 19–32)
Chloride: 103 mEq/L (ref 96–112)
Creatinine, Ser: 1.1 mg/dL (ref 0.4–1.5)
Potassium: 4.5 mEq/L (ref 3.5–5.1)

## 2011-10-20 LAB — LIPID PANEL
Total CHOL/HDL Ratio: 3
Triglycerides: 113 mg/dL (ref 0.0–149.0)

## 2011-10-21 ENCOUNTER — Encounter: Payer: Self-pay | Admitting: Internal Medicine

## 2011-10-21 NOTE — Assessment & Plan Note (Signed)
stable overall by hx and exam, most recent data reviewed with pt, and pt to continue medical treatment as before Lab Results  Component Value Date   LDLCALC 79 10/20/2011
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
## 2011-10-21 NOTE — Assessment & Plan Note (Signed)
stable overall by hx and exam, most recent data reviewed with pt, and pt to continue medical treatment as before Lab Results  Component Value Date   HGBA1C 6.1 10/20/2011

## 2011-10-21 NOTE — Assessment & Plan Note (Signed)
stable overall by hx and exam,, dnd pt to continue medical treatment as before - med refilles

## 2011-10-21 NOTE — Assessment & Plan Note (Signed)
With mild to mod flare - for depomedrol IM, rx for allegra prn, and re-start flonase asd,  to f/u any worsening symptoms or concerns

## 2011-10-23 LAB — TESTOSTERONE, FREE, TOTAL, SHBG
Sex Hormone Binding: 213 nmol/L — ABNORMAL HIGH (ref 13–71)
Testosterone, Free: 26.5 pg/mL — ABNORMAL LOW (ref 47.0–244.0)
Testosterone-% Free: 0.5 % — ABNORMAL LOW (ref 1.6–2.9)
Testosterone: 579.9 ng/dL (ref 300–890)

## 2011-10-26 ENCOUNTER — Ambulatory Visit: Payer: Medicare HMO | Admitting: Gastroenterology

## 2011-12-05 ENCOUNTER — Other Ambulatory Visit: Payer: Self-pay | Admitting: Internal Medicine

## 2011-12-05 NOTE — Telephone Encounter (Signed)
Done hardcopy to [facility]bin  

## 2011-12-06 NOTE — Telephone Encounter (Signed)
Faxed hardcopy to pharmacy. 

## 2011-12-28 ENCOUNTER — Ambulatory Visit: Payer: Medicare HMO | Admitting: Gastroenterology

## 2012-03-08 NOTE — Progress Notes (Signed)
HISTORY OF PRESENT ILLNESS  Aaron Reid is a 56 y.o. male.  HPI  Post-laminectomy syndrome stable, has been on Suboxone in past  GERD stable  Hepatitis C infection stable, untested for several years    Review of Systems   HENT: Positive for neck pain.    Gastrointestinal: Positive for heartburn.   Musculoskeletal: Positive for back pain and joint pain.   All other systems reviewed and are negative.      Past Medical History   Diagnosis Date   ??? Cervical fusion syndrome 10/2003   ??? Chronic pain 1980     lower back    ??? Neurogenic bladder    ??? GERD (gastroesophageal reflux disease)    ??? Hepatitis C infection      History reviewed. No pertinent family history.  History     Social History   ??? Marital Status: SINGLE     Spouse Name: N/A     Number of Children: N/A   ??? Years of Education: N/A     Social History Main Topics   ??? Smoking status: Former Smoker -- 0.2 packs/day for 15 years   ??? Smokeless tobacco: Not on file   ??? Alcohol Use: No   ??? Drug Use: No   ??? Sexually Active: No     Other Topics Concern   ??? Not on file     Social History Narrative   ??? No narrative on file     No current outpatient prescriptions on file prior to visit.     BP 145/95   Pulse 96   Temp 97.2 ??F (36.2 ??C) (Oral)   Resp 18   Ht 5\' 10"  (1.778 m)   Wt 234 lb (106.142 kg)   BMI 33.58 kg/m2   SpO2 96%      Physical Exam   Vitals reviewed.  Constitutional: He appears well-developed and well-nourished. No distress.   Cardiovascular: Normal rate, regular rhythm and intact distal pulses.  Exam reveals no gallop and no friction rub.    No murmur heard.  Pulmonary/Chest: Effort normal and breath sounds normal. No respiratory distress. He has no wheezes. He has no rales. He exhibits no tenderness.   Abdominal: Soft. Bowel sounds are normal. He exhibits no distension and no mass. There is no tenderness. There is no rebound and no guarding.   Musculoskeletal: Normal range of motion. He exhibits no edema and no tenderness.   Skin: He is not  diaphoretic.       ASSESSMENT and PLAN  post-laminectomy syndrome  Neurogenic bladder  GERD  Hepatitis C infection  Plan  Old records  Refer  To pain mgmt  Labs return for physical

## 2012-03-15 NOTE — Telephone Encounter (Signed)
Referral and last office note faxed to [facility]

## 2012-03-20 LAB — CBC WITH AUTOMATED DIFF
ABS. BASOPHILS: 0 10*3/uL (ref 0.0–0.2)
ABS. EOSINOPHILS: 0.1 10*3/uL (ref 0.0–0.4)
ABS. IMM. GRANS.: 0 10*3/uL (ref 0.0–0.1)
ABS. MONOCYTES: 0.4 10*3/uL (ref 0.1–0.9)
ABS. NEUTROPHILS: 1.9 10*3/uL (ref 1.4–7.0)
Abs Lymphocytes: 2.2 10*3/uL (ref 0.7–3.1)
BASOPHILS: 0 % (ref 0–3)
EOSINOPHILS: 2 % (ref 0–5)
HCT: 45.9 % (ref 37.5–51.0)
HGB: 15.4 g/dL (ref 12.6–17.7)
IMMATURE GRANULOCYTES: 0 % (ref 0–2)
Lymphocytes: 48 % — ABNORMAL HIGH (ref 14–46)
MCH: 30.7 pg (ref 26.6–33.0)
MCHC: 33.6 g/dL (ref 31.5–35.7)
MCV: 91 fL (ref 79–97)
MONOCYTES: 8 % (ref 4–12)
NEUTROPHILS: 42 % (ref 40–74)
PLATELET: 188 10*3/uL (ref 155–379)
RBC: 5.02 x10E6/uL (ref 4.14–5.80)
RDW: 13.5 % (ref 12.3–15.4)
WBC: 4.7 10*3/uL (ref 3.4–10.8)

## 2012-03-20 LAB — METABOLIC PANEL, COMPREHENSIVE
A-G Ratio: 1.1 (ref 1.1–2.5)
ALT (SGPT): 31 IU/L (ref 0–44)
AST (SGOT): 38 IU/L (ref 0–40)
Albumin: 3.9 g/dL (ref 3.5–5.5)
Alk. phosphatase: 58 IU/L (ref 44–102)
BUN/Creatinine ratio: 19 (ref 9–20)
BUN: 17 mg/dL (ref 6–24)
Bilirubin, total: 0.5 mg/dL (ref 0.0–1.2)
CO2: 23 mmol/L (ref 19–28)
Calcium: 9.2 mg/dL (ref 8.7–10.2)
Chloride: 101 mmol/L (ref 97–108)
Creatinine: 0.89 mg/dL (ref 0.76–1.27)
GFR est AA: 110 mL/min/{1.73_m2} (ref >59)
GFR est non-AA: 95 mL/min/{1.73_m2} (ref >59)
GLOBULIN, TOTAL: 3.5 g/dL (ref 1.5–4.5)
Glucose: 111 mg/dL — ABNORMAL HIGH (ref 65–99)
Potassium: 4.4 mmol/L (ref 3.5–5.2)
Protein, total: 7.4 g/dL (ref 6.0–8.5)
Sodium: 141 mmol/L (ref 134–144)

## 2012-03-20 LAB — LIPID PANEL
Cholesterol, total: 146 mg/dL (ref 100–199)
HDL Cholesterol: 48 mg/dL (ref >39)
LDL, calculated: 80 mg/dL (ref 0–99)
Triglyceride: 91 mg/dL (ref 0–149)
VLDL, calculated: 18 mg/dL (ref 5–40)

## 2012-03-20 LAB — CVD REPORT: PDF IMAGE: 0

## 2012-03-21 LAB — HCV FIBROSURE
A2Macroglobulins: 491 mg/dL — ABNORMAL HIGH (ref 110–276)
ALT (SGPT): 37 IU/L (ref 0–55)
Apolipoprotein A-1: 162 mg/dL (ref 110–180)
Bilirubin, total: 0.4 mg/dL (ref 0.0–1.2)
Fibrosis score: 0.84 — ABNORMAL HIGH (ref 0.00–0.21)
GGT: 103 IU/L — ABNORMAL HIGH (ref 0–65)
Haptoglobin: 36 mg/dL (ref 34–200)
Necroinflammation activity score: 0.36 — ABNORMAL HIGH (ref 0.00–0.17)

## 2012-03-22 NOTE — Progress Notes (Signed)
Quick Note:    Call, his liver test was high. I want him to see Dr. Larose Kells with [facility] [address], liver specialist in [place].  ______

## 2012-03-25 NOTE — Progress Notes (Signed)
Result letter mailed

## 2012-04-01 NOTE — Telephone Encounter (Signed)
Call for me

## 2012-04-01 NOTE — Telephone Encounter (Signed)
Pt have questions regarding his liver test and labs. Pt requesting Dr. Doristine Church to call him  CB [phone]

## 2012-04-04 LAB — AMB POC HEMOGLOBIN A1C: Hemoglobin A1c (POC): 5.7 %

## 2012-04-04 NOTE — Progress Notes (Signed)
HISTORY OF PRESENT ILLNESS  Aaron Reid is a 56 y.o. male.  HPI  Hep c infection stable  clbp stable      Review of Systems   Musculoskeletal: Positive for back pain.   All other systems reviewed and are negative.      Past Medical History   Diagnosis Date   ??? Cervical fusion syndrome 10/2003   ??? Chronic pain 1980     lower back    ??? Neurogenic bladder    ??? GERD (gastroesophageal reflux disease)    ??? Hepatitis C infection      Current outpatient prescriptions:pantoprazole (PROTONIX) 40 mg tablet, Take 40 mg by mouth daily., Disp: , Rfl: ;  solifenacin (VESICARE) 10 mg tablet, Take 10 mg by mouth daily., Disp: , Rfl: ;  ranitidine (ZANTAC) 300 mg tablet, Take 300 mg by mouth daily., Disp: , Rfl: ;  fluticasone (FLONASE) 50 mcg/actuation nasal spray, nightly., Disp: , Rfl: ;  aspirin delayed-release 81 mg tablet, Take  by mouth daily., Disp: , Rfl:   calcium carbonate (ANTACID EXTRA-STRENGTH) 200 mg calcium (500 mg) Chew, Take 1 Tab by mouth daily., Disp: , Rfl: ;  naproxen sodium (ALEVE) 220 mg cap, Take  by mouth., Disp: , Rfl: ;  multivitamin (ONE A DAY) tablet, Take 1 Tab by mouth daily., Disp: , Rfl: ;  ibuprofen (ADVIL) 200 mg tablet, Take  by mouth., Disp: , Rfl:   BP 122/88   Pulse 81   Ht 5\' 10"  (1.778 m)   Wt 234 lb (106.142 kg)   BMI 33.58 kg/m2      Physical Exam   Vitals reviewed.  Constitutional: He appears well-developed and well-nourished. No distress.   Cardiovascular: Normal rate, regular rhythm, normal heart sounds and intact distal pulses.  Exam reveals no gallop and no friction rub.    No murmur heard.  Musculoskeletal: Normal range of motion. He exhibits no edema and no tenderness.   Skin: He is not diaphoretic.     Reviewed labs  fibrosis index very high  BS 111  ASSESSMENT and PLAN  hyperglycemia  Chronic hep c  clbp  Plan  A1C 5.7    Refer to Dr. Tamala Fothergill  Old records from [facility]

## 2012-04-05 NOTE — Telephone Encounter (Signed)
Referral and office notes faxed to Dr. Larose Kells

## 2012-04-17 ENCOUNTER — Encounter: Payer: Medicare HMO | Admitting: Internal Medicine

## 2012-04-17 DIAGNOSIS — Z0289 Encounter for other administrative examinations: Secondary | ICD-10-CM

## 2012-04-18 NOTE — Progress Notes (Signed)
Subjective:     Aaron Reid is a 56 y.o. male presenting for annual exam and complete physical.    Patient Active Problem List   Diagnosis Code   ??? Lumbago 724.2   ??? Knee pain 719.46     Patient Active Problem List    Diagnosis Date Noted   ??? Lumbago 03/08/2012   ??? Knee pain 03/08/2012     Current Outpatient Prescriptions   Medication Sig Dispense Refill   ??? pantoprazole (PROTONIX) 40 mg tablet Take 40 mg by mouth daily.       ??? solifenacin (VESICARE) 10 mg tablet Take 10 mg by mouth daily.       ??? ranitidine (ZANTAC) 300 mg tablet Take 300 mg by mouth daily.       ??? fluticasone (FLONASE) 50 mcg/actuation nasal spray nightly.       ??? aspirin delayed-release 81 mg tablet Take  by mouth daily.       ??? calcium carbonate (ANTACID EXTRA-STRENGTH) 200 mg calcium (500 mg) Chew Take 1 Tab by mouth daily.       ??? naproxen sodium (ALEVE) 220 mg cap Take  by mouth.       ??? multivitamin (ONE A DAY) tablet Take 1 Tab by mouth daily.       ??? ibuprofen (ADVIL) 200 mg tablet Take  by mouth.         No current facility-administered medications for this visit.     Allergies   Allergen Reactions   ??? Latex Rash     Past Medical History   Diagnosis Date   ??? Cervical fusion syndrome 10/2003   ??? Chronic pain 1980     lower back    ??? Neurogenic bladder    ??? GERD (gastroesophageal reflux disease)    ??? Hepatitis C infection      Past Surgical History   Procedure Laterality Date   ??? Endoscopy, colon, diagnostic     ??? Hx orthopaedic       spinal fusion, lumbar, cervical     History reviewed. No pertinent family history.  History   Substance Use Topics   ??? Smoking status: Former Smoker -- 0.25 packs/day for 15 years   ??? Smokeless tobacco: Not on file   ??? Alcohol Use: No          Component Value Date/Time   WBC 4.7 03/19/2012 12:00 AM   HGB 15.4 03/19/2012 12:00 AM   HCT 45.9 03/19/2012 12:00 AM   PLATELET 188 03/19/2012 12:00 AM   MCV 91 03/19/2012 12:00 AM       Component Value Date/Time   LDL, calculated 80 03/19/2012 12:00 AM    Creatinine 0.89 03/19/2012 12:00 AM        Component Value Date/Time   Cholesterol, total 146 03/19/2012 12:00 AM   HDL Cholesterol 48 03/19/2012 12:00 AM   LDL, calculated 80 03/19/2012 12:00 AM   Triglyceride 91 03/19/2012 12:00 AM       Component Value Date/Time   ALT 31 03/19/2012 12:00 AM   AST 38 03/19/2012 12:00 AM   Alk. phosphatase 58 03/19/2012 12:00 AM   Bilirubin, total 0.5 03/19/2012 12:00 AM   Bilirubin, total 0.4 03/19/2012 12:00 AM       Component Value Date/Time   GFR est AA 110 03/19/2012 12:00 AM   GFR est non-AA 95 03/19/2012 12:00 AM   Creatinine 0.89 03/19/2012 12:00 AM   BUN 17 03/19/2012 12:00 AM   Sodium 141  03/19/2012 12:00 AM   Potassium 4.4 03/19/2012 12:00 AM   Chloride 101 03/19/2012 12:00 AM   CO2 23 03/19/2012 12:00 AM      No results found for this basename: TSH, TSH2, TSH3, TSHP, TSHELE, T3RIA, TT3, T3U, T3UP, FRT3, FT3, FT4, FT4P, T4, T4P, FT4T, 1164, TT7, TMCAB   Lab Results   Component Value Date/Time    Glucose 111 03/19/2012 12:00 AM   reviewed labs    Review of Systems  A comprehensive review of systems was negative.    Objective:     BP 130/89   Pulse 80   Ht 5\' 10"  (1.778 m)   Wt 233 lb 12.8 oz (106.051 kg)   BMI 33.55 kg/m2  Physical exam:   General appearance - alert, well appearing, and in no distress, oriented to person, place, and time, overweight and well hydrated  Mental status - alert, oriented to person, place, and time, anxious  Eyes - pupils equal and reactive, extraocular eye movements intact, sclera anicteric  Mouth - mucous membranes moist, pharynx normal without lesions and tongue normal  Neck - supple, no significant adenopathy, carotids upstroke normal bilaterally, no bruits  Lymphatics - no palpable lymphadenopathy, no hepatosplenomegaly  Chest - clear to auscultation, no wheezes, rales or rhonchi, symmetric air entry  Heart - normal rate, regular rhythm, normal S1, S2, no murmurs, rubs, clicks or gallops  Abdomen - soft, nontender, nondistended, no masses  or organomegaly  bowel sounds normal  no bladder distension noted  no abdominal bruits  no pulsatile masses  no CVA tenderness  no inguinal adenopathy  no hernias noted  GU Male - no penile lesions or discharge, no testicular masses or tenderness, no hernias, PROSTATE EXAM: smooth and symmetric without nodules or tenderness, enlarged 2+, TESTICULAR EXAM: normal, no masses, PENIS: normal without lesions or discharge, circumcised, SCROTUM: normal, no masses  Rectal - negative without mass, lesions or tenderness  Back exam - full range of motion, no tenderness, palpable spasm or pain on motion, normal reflexes and strength bilateral lower extremities, sensory exam intact bilateral lower extremities  Neurological - alert, oriented, normal speech, no focal findings or movement disorder noted, screening mental status exam normal, neck supple without rigidity, cranial nerves II through XII intact, motor and sensory grossly normal bilaterally, normal muscle tone, no tremors, strength 5/5  Musculoskeletal - no joint tenderness, deformity or swelling, no muscular tenderness noted, full range of motion without pain  Extremities - peripheral pulses normal, no pedal edema, no clubbing or cyanosis, no pedal edema noted, intact peripheral pulses  Skin - normal coloration and turgor, no rashes, no suspicious skin lesions noted     Assessment/Plan:     Routine exam  Refer to pain mgmt, hepatology.   Lorazepam 0.5 mg prn, #30  anxiety, etc. questionnaires.

## 2012-04-25 NOTE — Telephone Encounter (Signed)
Referral and office notes faxed to Dr. Larose Kells phone [phone], fax [phone]. Auth for Dr. Katrinka Blazing at [facility] still pending

## 2012-05-06 NOTE — Telephone Encounter (Signed)
Referral and office notes faxed to [facility] at [facility] to schedule with Dr. Liz Beach

## 2012-06-13 ENCOUNTER — Other Ambulatory Visit: Payer: Self-pay | Admitting: Internal Medicine

## 2012-07-02 NOTE — Progress Notes (Signed)
HISTORY OF PRESENT ILLNESS  Aaron Reid is a 57 y.o. male.  HPI  clbp increasing back pain, s/p lumbar fusion  OAB stable  Chronic hepatitis C ? Referral  C/o sinus congestion    Review of Systems   Musculoskeletal: Positive for back pain.   All other systems reviewed and are negative.      Past Medical History   Diagnosis Date   ??? Cervical fusion syndrome 10/2003   ??? Chronic pain 1980     lower back    ??? Neurogenic bladder    ??? GERD (gastroesophageal reflux disease)    ??? Hepatitis C infection      Current outpatient prescriptions:buprenorphine-naloxone (SUBOXONE) 8-2 mg film sublingaul film, by SubLINGual route daily., Disp: , Rfl: ;  LORazepam (ATIVAN) 0.5 mg tablet, Take 1 Tab by mouth every eight (8) hours as needed for Anxiety., Disp: 30 Tab, Rfl: 0;  pantoprazole (PROTONIX) 40 mg tablet, Take 40 mg by mouth daily., Disp: , Rfl: ;  ranitidine (ZANTAC) 300 mg tablet, Take 300 mg by mouth daily., Disp: , Rfl:   fluticasone (FLONASE) 50 mcg/actuation nasal spray, nightly., Disp: , Rfl: ;  aspirin delayed-release 81 mg tablet, Take  by mouth daily., Disp: , Rfl: ;  calcium carbonate (ANTACID EXTRA-STRENGTH) 200 mg calcium (500 mg) Chew, Take 1 Tab by mouth daily., Disp: , Rfl: ;  multivitamin (ONE A DAY) tablet, Take 1 Tab by mouth daily., Disp: , Rfl: ;  solifenacin (VESICARE) 10 mg tablet, Take 10 mg by mouth daily., Disp: , Rfl:   naproxen sodium (ALEVE) 220 mg cap, Take  by mouth., Disp: , Rfl: ;  ibuprofen (ADVIL) 200 mg tablet, Take  by mouth., Disp: , Rfl:   BP 132/90   Pulse 77   Ht 5\' 10"  (1.778 m)   Wt 234 lb (106.142 kg)   BMI 33.58 kg/m2      Physical Exam   Vitals reviewed.  Constitutional: He appears well-developed and well-nourished. No distress.   HENT:   Head: Normocephalic and atraumatic.   Mouth/Throat: No oropharyngeal exudate.   Tm dull x 2   Neck: Normal range of motion. Neck supple. No thyromegaly present.   Musculoskeletal: He exhibits tenderness. He exhibits no edema.   SLR to 40  degrees with pain x 2   Lymphadenopathy:     He has no cervical adenopathy.   Skin: He is not diaphoretic.     Reviewed old records  Reviewed Beck screening, all negative  ASSESSMENT and PLAN  post laminectomy syndrome  Sinusitis  Hep c  Plan  Medrol dospak  z-pak

## 2012-07-04 NOTE — Telephone Encounter (Signed)
Referral and office notes re-faxed to Dr. Larose Kells
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
## 2012-08-20 NOTE — Patient Instructions (Addendum)
1. You have had extensive spinal surgeries and have continuing neck and back pain  2. There is also joint pain, particularly the knees  3. You were getting ab[place]  4. I am concerned about your severe reflux symptoms  5. You also have marked nocturia and need continuing urologic care    Treatment plan:  ?? Start fentanyl patch taking 25 mcg, but needs to be Mylan or [employer].  ?? These are intended to last three days but often there is "wearing off" by the 3rd day  ?? In this case, change each two days  ?? If the 25 mcg strength is not helping enough, increase to the 50 mcg patch  ?? Breakthrough will be Percocet 7.5/325 taken up to three times a day  ?? Stop all NSAIDS--these aggravate reflux  ?? Start Cymbalta at 30 mg a day for a week and then increase to 60 mg daily  ?? Stop Suboxone--this was never a good idea  ?? If think that you need to see gastroenterology--talk to Dr. Doristine Church about this  ?? You need a urologist and I will make a referral  ?? We will get x-rays of the back, neck and knees  ?? Follow up two months  ?? Follow up in two months

## 2012-08-21 LAB — AMB POC DRUG SCREEN (G0434)

## 2012-08-28 NOTE — Telephone Encounter (Signed)
Pt called stating he was having a reaction to medication; he was given two new RX last week for Cymbalta and Fentanyl Patch. His S&S are gitteriness; anxiety and agitation.  Informed Dr Stevphen Rochester of above problem and received a verbal order to inform pt to stop taking Cymbalta and see what happens. Called pt and instructed him to stop taking the Cymbalta and see what happens.  Pt was reassured that if it is the Cymbalta, the symptoms will subside. Pt expressed understanding.    Lelon Mast LPN

## 2012-08-28 NOTE — Telephone Encounter (Signed)
Patient called and left vm message that he is having a reaction to his medication. He did not state what medication or what reaction he was experiencing. He requested a call back.    Returned patients call, left message asking for return call regarding medication reaction.

## 2012-10-17 NOTE — Telephone Encounter (Signed)
Pt calling stating that he received a call from [place] that he is returning.

## 2012-10-18 ENCOUNTER — Encounter

## 2012-10-18 NOTE — Addendum Note (Signed)
Addended by: Donzetta Matters on: 10/18/2012 01:55 PM     Modules accepted: Orders

## 2012-10-18 NOTE — Telephone Encounter (Signed)
Dr. Doristine Church called practice. Patient needed to reschedule cancelled appointment. An attempt was made to notify the patient yesterday. He was booked to see Dr. Maggie Font on 10/21/12.

## 2012-10-18 NOTE — Progress Notes (Signed)
HISTORY OF PRESENT ILLNESS  Aaron Reid is a 57 y.o. male.  HPI  Post laminectomy syndrome seeing pain mgmt  Fentanyl caused dizziness  Chronic hep c infection seeing Dr. Larose Kells next week  clbp as above  oab off meds  gerd off meds  C/o episodic diaphoresis  No fever, arthralgias, flu-like syndrome    Review of Systems   Gastrointestinal: Positive for heartburn.   Genitourinary: Positive for frequency.   Musculoskeletal: Positive for back pain.   All other systems reviewed and are negative.      Past Medical History   Diagnosis Date   ??? Cervical fusion syndrome 10/2003   ??? Chronic pain 1980     lower back    ??? Neurogenic bladder    ??? GERD (gastroesophageal reflux disease)    ??? Hepatitis C infection    ??? Back injury    ??? Neck injury    ??? Head injury    ??? Arthritis      Current outpatient prescriptions:solifenacin (VESICARE) 10 mg tablet, Take 10 mg by mouth daily., Disp: , Rfl: ;  fluticasone (FLONASE) 50 mcg/actuation nasal spray, nightly., Disp: , Rfl: ;  aspirin delayed-release 81 mg tablet, Take  by mouth daily., Disp: , Rfl: ;  calcium carbonate (ANTACID EXTRA-STRENGTH) 200 mg calcium (500 mg) Chew, Take 1 Tab by mouth daily., Disp: , Rfl:   multivitamin (ONE A DAY) tablet, Take 1 Tab by mouth daily., Disp: , Rfl: ;  fentaNYL (DURAGESIC) 50 mcg/hr PATCH, 1 Patch by TransDERmal route every fourty-eight (48) hours. For chronic pain. Needs to be [employer]ylan or [employer] manufactured., Disp: 15 Patch, Rfl: 0;  oxyCODONE-acetaminophen (PERCOCET) 7.5-325 mg per tablet, Take 1 Tab by mouth every eight (8) hours as needed for Pain., Disp: 90 Tab, Rfl: 0  LORazepam (ATIVAN) 0.5 mg tablet, Take 1 Tab by mouth every eight (8) hours as needed for Anxiety., Disp: 30 Tab, Rfl: 0  BP 139/88   Pulse 79   Ht 5\' 10"  (1.778 m)   Wt 226 lb 6.4 oz (102.694 kg)   BMI 32.48 kg/m2      Physical Exam   Vitals reviewed.  Constitutional: He is oriented to person, place, and time. He appears well-developed and well-nourished. No  distress.   HENT:   Head: Normocephalic and atraumatic.   Mouth/Throat: No oropharyngeal exudate.   Eyes: Conjunctivae are normal. Pupils are equal, round, and reactive to light. No scleral icterus.   Neck: Normal range of motion. Neck supple. No thyromegaly present.   Cardiovascular: Normal rate, regular rhythm, normal heart sounds and intact distal pulses.  Exam reveals no gallop and no friction rub.    No murmur heard.  Pulmonary/Chest: Effort normal and breath sounds normal. No respiratory distress. He has no wheezes. He has no rales. He exhibits no tenderness.   Abdominal: Soft. Bowel sounds are normal. He exhibits no distension and no mass. There is no tenderness. There is no rebound and no guarding.   Musculoskeletal: Normal range of motion. He exhibits no edema and no tenderness.   Lymphadenopathy:     He has no cervical adenopathy.   Neurological: He is alert and oriented to person, place, and time. He has normal reflexes. He displays normal reflexes. No cranial nerve deficit. He exhibits normal muscle tone. Coordination normal.   Skin: Skin is warm and dry. Rash noted. He is not diaphoretic. No erythema. No pallor.   + acanthosis nigricans  - stigmata of cirrhosis  Reviewed consultant reports  D/w pain mgmt  ASSESSMENT and PLAN  post laminectomy syndrome  oab  gerd  Plan  Labs include quantiferon  cxr  This is an Initial Medicare Annual Wellness Exam (AWV) (Performed 12 months after IPPE or effective date of Medicare Part B enrollment, Once in a lifetime)    I have reviewed the patient's medical history in detail and updated the computerized patient record.     History     Past Medical History   Diagnosis Date   ??? Cervical fusion syndrome 10/2003   ??? Chronic pain 1980     lower back    ??? Neurogenic bladder    ??? GERD (gastroesophageal reflux disease)    ??? Hepatitis C infection    ??? Back injury    ??? Neck injury    ??? Head injury    ??? Arthritis       Past Surgical History   Procedure Laterality Date   ???  Endoscopy, colon, diagnostic     ??? Hx orthopaedic       spinal fusion, lumbar, cervical     Current Outpatient Prescriptions   Medication Sig Dispense Refill   ??? solifenacin (VESICARE) 10 mg tablet Take 10 mg by mouth daily.       ??? fluticasone (FLONASE) 50 mcg/actuation nasal spray nightly.       ??? aspirin delayed-release 81 mg tablet Take  by mouth daily.       ??? calcium carbonate (ANTACID EXTRA-STRENGTH) 200 mg calcium (500 mg) Chew Take 1 Tab by mouth daily.       ??? multivitamin (ONE A DAY) tablet Take 1 Tab by mouth daily.       ??? fentaNYL (DURAGESIC) 50 mcg/hr PATCH 1 Patch by TransDERmal route every fourty-eight (48) hours. For chronic pain. Needs to be Mylan or [employer] manufactured.  15 Patch  0   ??? oxyCODONE-acetaminophen (PERCOCET) 7.5-325 mg per tablet Take 1 Tab by mouth every eight (8) hours as needed for Pain.  90 Tab  0   ??? LORazepam (ATIVAN) 0.5 mg tablet Take 1 Tab by mouth every eight (8) hours as needed for Anxiety.  30 Tab  0     Allergies   Allergen Reactions   ??? Latex Rash     Family History   Problem Relation Age of Onset   ??? Heart Disease Sister    ??? Diabetes Brother    ??? Stroke Brother      History   Substance Use Topics   ??? Smoking status: Former Smoker -- 0.25 packs/day for 15 years   ??? Smokeless tobacco: Not on file   ??? Alcohol Use: No     Patient Active Problem List   Diagnosis Code   ??? Chronic low back pain 724.2, 338.29   ??? Knee pain 719.46   ??? Lumbar post-laminectomy syndrome 722.83   ??? Chronic hepatitis C 070.54   ??? Fusion of spine of lumbar region 756.15   ??? Chronic neck pain 723.1, 338.29   ??? Fusion of spine of cervical region 756.15         Depression Risk Factor Screening:     PHQ 2 / 9, over the last two weeks 10/18/2012   Little interest or pleasure in doing things Not at all   Feeling down, depressed or hopeless Not at all   Total Score PHQ 2 0     Alcohol Risk Factor Screening:   On any occasion during the past  3 months, have you had more than 4 drinks containing alcohol?   No    Do you average more than 14 drinks per week?  No    Functional Ability and Level of Safety:     Hearing Loss   normal-to-mild    Activities of Daily Living   Self-care.   Requires assistance with: no ADLs    Fall Risk     Abuse Screen   Patient is not abused    Review of Systems   A comprehensive review of systems was negative except for: Musculoskeletal: positive for back pain    Physical Examination     No exam data present    Evaluation of Cognitive Function:  Mood/affect:  neutral  Appearance: age appropriate  Family member/caregiver input: none    BP 139/88   Pulse 79   Ht 5\' 10"  (1.778 m)   Wt 226 lb 6.4 oz (102.694 kg)   BMI 32.48 kg/m2  General:  Alert, cooperative, no distress, appears stated age.   Head:  Normocephalic, without obvious abnormality, atraumatic.   Eyes:  Conjunctivae/corneas clear. PERRL, EOMs intact. Fundi benign   Ears:  Normal TMs and external ear canals both ears.   Nose: Nares normal. Septum midline. Mucosa normal. No drainage or sinus tenderness.   Throat: Lips, mucosa, and tongue normal. Teeth and gums normal.   Neck: Supple, symmetrical, trachea midline, no adenopathy, thyroid: no enlargement/tenderness/nodules, no carotid bruit and no JVD.   Back:   Symmetric, no curvature. ROM normal. No CVA tenderness.   Lungs:   Clear to auscultation bilaterally.   Chest wall:  No tenderness or deformity.   Heart:  Regular rate and rhythm, S1, S2 normal, no murmur, click, rub or gallop.   Abdomen:   Soft, non-tender. Bowel sounds normal. No masses,  No organomegaly.   Genitalia:  Normal male without lesion, discharge or tenderness.   Rectal:  Normal tone, normal prostate, no masses or tenderness  Guaiac negative stool.   Extremities: Extremities normal, atraumatic, no cyanosis or edema.   Pulses: 2+ and symmetric all extremities.   Skin: Skin color, texture, turgor normal. No rashes or lesions   Lymph nodes: Cervical, supraclavicular, and axillary nodes normal.   Neurologic: CNII-XII  intact. Normal strength, sensation and reflexes throughout.       Patient Care Team:  Donzetta Matters, MD as PCP - General (Internal Medicine)    Advice/Referrals/Counseling   Education and counseling provided:  Are appropriate based on today's review and evaluation    Assessment/Plan   current treatment plan is effective, no change in therapy.

## 2012-10-20 NOTE — Progress Notes (Signed)
call, cxr normal

## 2012-10-21 LAB — AMB POC DRUG SCREEN (G0434)
ALCOHOL UR POC: NEGATIVE
AMPHETAMINES UR POC: NEGATIVE
BARBITURATES UR POC: NEGATIVE
BENZODIAZEPINES UR POC: NEGATIVE
BUPRENORPHINE UR POC: NEGATIVE
CANNABINOIDS UR POC: NEGATIVE
CARISOPRODOL UR POC: NEGATIVE
COCAINE UR POC: NEGATIVE
FENTANYL UR POC: NEGATIVE
MDMA/ECSTASY UR POC: NEGATIVE
METHADONE UR POC: NEGATIVE
METHAMPHETAMINE UR POC: NEGATIVE
METHYLPHENIDATE UR POC: NEGATIVE
OPIATES UR POC: NEGATIVE
OXYCODONE UR POC: NEGATIVE
PHENCYCLIDINE UR POC: NEGATIVE
PROPOXYPHENE UR POC: NEGATIVE
TRAMADOL UR POC: NEGATIVE
TRICYCLICS UR POC: NEGATIVE

## 2012-10-21 NOTE — Progress Notes (Signed)
HISTORY OF PRESENT ILLNESS  Aaron Reid is a 57 y.o. male.  HPI Returns for followup of chronic, severe pain involving the cervical and dorsal as well as lumbar spines attributed to underlying post lumbar laminectomy syndrome and post cervical laminectomy syndrome. He also suffers from underlying osteoarthritis involving predominantly the knees.  Since last seen, she has not required any emergency room or urgent care visits, nor has he been hospitalized.  He has been seen by his PCP for routine physical.  No change in his general medical condition.  He has had no recent falls.  No concerns with respect to physical, mental, or sexual abuse.  His medication regimen has been unsatisfactory. He has not been able to tolerate the fentanyl or the oxycodone. He notes that he had been on Suboxone in the past which did provide reasonable relief.  Pain is currently averaging 6/10.  Pain level today is 6/10, outcome 15/28,(The lower the upper number, the better the outcome)  Physical activity and mobility are fair, sleep is poor, mood is good.  Side effects as noted above.  A review of the [place] prescription monitoring programs and do not identify any inconsistencies.  UDS obtained and reviewed; formal confirmation from laboratory is pending.      Review of Systems   Constitutional: Positive for malaise/fatigue.   HENT: Positive for neck pain.    Gastrointestinal: Positive for heartburn, nausea and constipation.   Musculoskeletal: Positive for myalgias, back pain and joint pain (polyarticular).   Neurological: Positive for weakness (generalized).   Psychiatric/Behavioral: The patient has insomnia.    All other systems reviewed and are negative.        Physical Exam   Nursing note and vitals reviewed.  Constitutional: He is oriented to person, place, and time. He appears distressed.   HENT:   Head: Normocephalic and atraumatic.   Right Ear: External ear normal.   Left Ear: External ear normal.    Nose: Nose normal.   Mouth/Throat: Oropharynx is clear and moist. No oropharyngeal exudate.   Eyes: Conjunctivae and EOM are normal. Pupils are equal, round, and reactive to light. Right eye exhibits no discharge. Left eye exhibits no discharge. No scleral icterus.   Neck: Spinous process tenderness and muscular tenderness (spasms) present. Decreased range of motion present. No thyromegaly present.   Cardiovascular: Normal rate, regular rhythm and normal heart sounds.    Pulmonary/Chest: Effort normal and breath sounds normal. No respiratory distress. He has no wheezes. He has no rales.   Abdominal: Soft. He exhibits no distension. There is no tenderness. There is no rebound and no guarding.   Musculoskeletal: He exhibits tenderness.        Right shoulder: He exhibits decreased range of motion, tenderness and pain.        Left shoulder: He exhibits decreased range of motion, tenderness and pain.        Right elbow: He exhibits decreased range of motion. Tenderness (diffuse) found.        Left elbow: He exhibits decreased range of motion. Tenderness (crepitus) found.        Right wrist: He exhibits decreased range of motion, tenderness, bony tenderness and crepitus.        Left wrist: He exhibits decreased range of motion, tenderness, bony tenderness and crepitus.        Right knee: He exhibits decreased range of motion (crepitus) and bony tenderness.        Left knee: He exhibits decreased  range of motion (crepitus) and bony tenderness.        Right ankle: He exhibits decreased range of motion. Tenderness.        Left ankle: He exhibits decreased range of motion. Tenderness.        Lumbar back: He exhibits decreased range of motion, tenderness, pain and spasm.   Neurological: He is alert and oriented to person, place, and time. He has normal reflexes. No cranial nerve deficit or sensory deficit. He exhibits normal muscle tone. Gait abnormal. Coordination normal.   Reflex Scores:       Tricep reflexes are 2+ on the  right side and 2+ on the left side.       Bicep reflexes are 2+ on the right side and 2+ on the left side.       Brachioradialis reflexes are 2+ on the right side and 2+ on the left side.       Patellar reflexes are 2+ on the right side and 2+ on the left side.       Achilles reflexes are 2+ on the right side and 2+ on the left side.  Skin: Skin is warm. No rash noted.   Psychiatric: He has a normal mood and affect. His behavior is normal. Judgment and thought content normal.       ASSESSMENT and PLAN  Encounter Diagnoses   Name Primary?   ??? Chronic pain syndrome Yes   ??? Fusion of spine of lumbar region    ??? Fusion of spine of cervical region    ??? Chronic neck pain    ??? Lumbar post-laminectomy syndrome    ??? Chronic hepatitis C    ??? Chronic low back pain    ??? Knee pain      Fentanyl and oxycodone will be discontinued as will Cymbalta.  Suboxone, 8 mg, up to 3 times daily for long-acting pain control.  A cervical epidural steroid injection will be scheduled. Potential risks and side effects of the procedure as well as appropriate preparation are discussed.  Synvisc injection in the left knee will also be arranged.  Further recommendations to be based upon the above.    No concerns are raised for misuse, abuse, or diversion.    1. Pain medications are prescribed with the objective of pain relief and improved physical and psychosocial function in this patient.  2. Counseled patient on proper use of prescribed medications and reviewed opioid contract.  3. Counseled patient about chronic medical conditions and their relationship to anxiety and depression and recommended mental health support as needed.  4. Reviewed with patient self-help tools, home exercise, and lifestyle changes to assist the patient in self-management of symptoms.  5. Advised patient to have a primary care provider to continue care for health maintenance and general medical conditions and support for referral to specialty care as needed.  6. Reviewed  with patient the treatment plan, goals of treatment plan, and limitations of treatment plan, to include the potential for side effects from medications and procedures. If side effects occur, it is the responsibility of the patient to inform the clinic so that a change in the treatment plan can be made in a safe manner. The patient is advised that stopping prescribed medication may cause an increase in symptoms and possible medication withdrawal symptoms. The patient is informed an emergency room evaluation may be necessary if this occurs.  DISPOSITION: The patient???s condition and plan were discussed at length and all questions were answered. The  patient agrees with the plan.    Counseling occupied > 50% of visit:  Total time: 35 minutes

## 2012-10-21 NOTE — Progress Notes (Signed)
Given pre-op instructions.

## 2012-10-21 NOTE — Progress Notes (Signed)
Attempted to call - no answer

## 2012-10-21 NOTE — Progress Notes (Signed)
Pill Count   PMP reviewed and UDS obtained.  Fentanyl 34mcg/hr fill date 09/10/12 #11-pt not currently wearing patch, does not remember when he last wore patch-1 unused script for fill date 09/18/12  Oxycodone with APAP 7.5/325mg  fill date 08/20/12 #87-1 unused hard script for fill date 09/18/12

## 2012-10-21 NOTE — Progress Notes (Signed)
Destroyed Fentanyl, oxycodone with apap, cymbalta after office visit.

## 2012-10-22 ENCOUNTER — Encounter

## 2012-10-22 LAB — CBC WITH AUTOMATED DIFF
ABS. BASOPHILS: 0 10*3/uL (ref 0.0–0.2)
ABS. EOSINOPHILS: 0.1 10*3/uL (ref 0.0–0.4)
ABS. IMM. GRANS.: 0 10*3/uL (ref 0.0–0.1)
ABS. MONOCYTES: 0.3 10*3/uL (ref 0.1–0.9)
ABS. NEUTROPHILS: 2 10*3/uL (ref 1.4–7.0)
Abs Lymphocytes: 2.3 10*3/uL (ref 0.7–3.1)
BASOPHILS: 0 % (ref 0–3)
EOSINOPHILS: 2 % (ref 0–5)
HCT: 44.2 % (ref 37.5–51.0)
HGB: 14.7 g/dL (ref 12.6–17.7)
IMMATURE GRANULOCYTES: 0 % (ref 0–2)
Lymphocytes: 50 % — ABNORMAL HIGH (ref 14–46)
MCH: 29.7 pg (ref 26.6–33.0)
MCHC: 33.3 g/dL (ref 31.5–35.7)
MCV: 89 fL (ref 79–97)
MONOCYTES: 7 % (ref 4–12)
NEUTROPHILS: 41 % (ref 40–74)
PLATELET: 185 10*3/uL (ref 155–379)
RBC: 4.95 x10E6/uL (ref 4.14–5.80)
RDW: 13.5 % (ref 12.3–15.4)
WBC: 4.8 10*3/uL (ref 3.4–10.8)

## 2012-10-22 LAB — QUANTIFERON TB GOLD

## 2012-10-22 LAB — PROTEIN ELECTROPHORESIS
A/G ratio: 1.1 (ref 0.7–2.0)
ALPHA-2 GLOBULIN: 0.8 g/dL (ref 0.4–1.2)
Albumin: 3.9 g/dL (ref 3.2–5.6)
Alpha-1-globulin: 0.3 g/dL (ref 0.1–0.4)
Beta globulin: 1 g/dL (ref 0.6–1.3)
Gamma globulin: 1.7 g/dL — ABNORMAL HIGH (ref 0.5–1.6)
Globulin, total: 3.7 g/dL (ref 2.0–4.5)

## 2012-10-22 LAB — METABOLIC PANEL, COMPREHENSIVE
A-G Ratio: 1.1 (ref 1.1–2.5)
ALT (SGPT): 36 IU/L (ref 0–44)
AST (SGOT): 40 IU/L (ref 0–40)
Albumin: 4 g/dL (ref 3.5–5.5)
Alk. phosphatase: 63 IU/L (ref 39–117)
BUN/Creatinine ratio: 15 (ref 9–20)
BUN: 14 mg/dL (ref 6–24)
Bilirubin, total: 0.5 mg/dL (ref 0.0–1.2)
CO2: 29 mmol/L — ABNORMAL HIGH (ref 19–28)
Calcium: 9.6 mg/dL (ref 8.7–10.2)
Chloride: 102 mmol/L (ref 97–108)
Creatinine: 0.92 mg/dL (ref 0.76–1.27)
GFR est AA: 106 mL/min/{1.73_m2} (ref >59)
GFR est non-AA: 91 mL/min/{1.73_m2} (ref >59)
GLOBULIN, TOTAL: 3.6 g/dL (ref 1.5–4.5)
Glucose: 102 mg/dL — ABNORMAL HIGH (ref 65–99)
Potassium: 4.5 mmol/L (ref 3.5–5.2)
Protein, total: 7.6 g/dL (ref 6.0–8.5)
Sodium: 139 mmol/L (ref 134–144)

## 2012-10-22 LAB — QUANTIFERON IN TUBE REFL
QFT TB Ag minus Nil Value: 0.3 IU/mL
QuantiFERON Mitogen Value: >10 IU/mL
QuantiFERON Nil Value: 0.05 IU/mL
QuantiFERON TB Ag Value: 0.35 IU/mL
QuantiFERON TB Gold: NEGATIVE

## 2012-10-22 LAB — C REACTIVE PROTEIN, QT: C-Reactive Protein, Qt: 0.4 mg/L (ref 0.0–4.9)

## 2012-10-22 LAB — TSH 3RD GENERATION: TSH: 0.817 u[IU]/mL (ref 0.450–4.500)

## 2012-10-22 LAB — T4, FREE: T4, Free: 1.39 ng/dL (ref 0.82–1.77)

## 2012-10-22 LAB — TESTOSTERONE, TOTAL, ADULT MALE: Testosterone: 313 ng/dL — ABNORMAL LOW (ref 348–1197)

## 2012-10-22 LAB — SED RATE (ESR): Sed rate (ESR): 24 mm/hr (ref 0–30)

## 2012-10-22 NOTE — Telephone Encounter (Signed)
Pt calling regarding his Suboxone. States that there is a problem getting it filled.  I have confirmed with the pharm that it needs a PA.  Forwarded message to Zena.

## 2012-10-23 ENCOUNTER — Encounter

## 2012-10-23 NOTE — Progress Notes (Signed)
Patient aware, he also needs an updated referral for Dr. Maggie Font

## 2012-10-23 NOTE — Progress Notes (Signed)
printed

## 2012-10-24 NOTE — Telephone Encounter (Signed)
Updated referral faxed to Dr. Maggie Font

## 2012-10-29 NOTE — Telephone Encounter (Signed)
Pt here in the office - had question about pa. Told him I had tried returning Shane's call from [facility] Pt said he was called and told suboxone was denied by [facility] [employer] and spoke with [place]. She said it was denied because the dx of chronic pain mgmt is considered off label use by Medicare.

## 2012-10-29 NOTE — Telephone Encounter (Signed)
Spoke with [place] about pt's situation. Scott said we could give him a script for vicodin to get him through until Dr Maggie Font comes back. When [facility] comes back we will talk to him to find out what can be done for the pt. Called pt and explained we could either give him a vicodin script to get him through til [facility] gets back or we can call in a withdrawal cocktail. Pt asked what a withdrawal coctail is and I explained there are no pain meds, only medications to help with withdrawal symptoms, if he were to go through withdrawals. Told him if he wants the vicodin script he will have to bring back the suboxone script. Pt said it is at the pharmacy. Told him he would have to go get the script and bring it back in exchange for the vicodin script. Pt said he would go get the script and will bring it back.

## 2012-10-29 NOTE — Telephone Encounter (Signed)
Explained to pt that Dr Maggie Font is out of the office until next Monday and Scott, our Production designer, theatre/television/film, is in a meeting, but I would let Lorin Picket know what is going on to find out what can be done to help the pt. Pt said he was put on the suboxone originally to get him off other opioids and then was continued for pain mgmt. [employer] does not accept pain mgmt as a valid dx. 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 Apparently, he has tried roxinol in the past, as he said if he can't get suboxone, that might be an alternative that wouldn't cloud his mind. Could someone call  [phone] him with direction as to what to do or what can be done. Does he need to come back for another appt, can a new script be given - can someone please help. He asked that - 'please don't let me fall through the cracks'. I told him someone will be in touch with him as to what can be done for him.

## 2012-10-31 NOTE — Telephone Encounter (Signed)
Returned pt's call. Pt said the insurance had faxed a form for appealing the denial of suboxone. I have not received any fax. Gave pt my fax number and told him to have the lady from the insurance to put it to my attention. Pt said he would call the insurance again and give her my name and fax number.

## 2012-11-05 NOTE — Telephone Encounter (Signed)
Pt called re denial of suboxone. Pt came in last week and wanted to know what to do because the suboxone was denied. Scott had offered to give him a script for hydrocodone/apap to cover til Dr Maggie Font got back. Pt called today and wants to know what can be done. Does he need to come in for another appt or can a prescription for something else be written? Pt is in pain and just wants to know what to do. An appeal was sent for the suboxone, but have not heard back from the insurance and not hopeful as to an approval because of the insurance's indication for suboxone - not for pain management.

## 2012-11-12 NOTE — Progress Notes (Signed)
[facility]  [facility]     Bryson Ha, MD, FACP, [facility]  Carma Lair, NP   Amy Genene Churn, NP   Kathaleen Grinder, NP        at [facility]     [address], [address]     [place], [place]  [address]     [phone]     FAX: [phone]    at [facility]     [address], [place]  [address]     [phone]     FAX: [phone]       Patient Care Team:  Donzetta Matters, MD as PCP - General (Internal Medicine)  Rosie Fate (Gastroenterology)      Problem List Date Reviewed: 11/12/2012        ICD-9-CM Class Noted    Degenerative arthritis of cervical spine 721.0  10/30/2012        Obesity, Class I, BMI 30-34.9 278.00  10/30/2012        Encounter for long-term (current) use of high-risk medication V58.69  10/30/2012        Mechanical low back pain 724.2  10/30/2012        Fusion of spine of lumbar region 756.15  08/20/2012        Chronic neck pain 723.1 338.29  08/20/2012        Fusion of spine of cervical region 756.15  08/20/2012        Lumbar post-laminectomy syndrome 722.83  07/02/2012        Chronic hepatitis C 070.54  07/02/2012        Chronic low back pain 724.2 338.29  03/08/2012        Knee pain 719.46  03/08/2012                The physicians listed above have asked me to see Aaron Reid in consultation regarding chronic HCV and its management.  All medical records sent by the referring physicians were reviewed.    The patient is a 57 y.o. Black male who was noted to have abnormalities in liver chemistries and subsequently tested positive for chronic HCV in 2011.  The patient was followed at [facility] for a few years and has recently moved to [place] area.    Risk factors for acquiring HCV are blood transfusions during back surgery in 1999.  There was no history of acute incteric hepatitis at the time of these risk factors.      Imaging of the liver was not recently performed.      A liver biopsy was performed in ~2000 at [facility].   The results are not available.  The patient reports this demonstrated mild scaring.    The patient has never received treatment for chronic HCV.      The most recent laboratory studies indicate that the liver transaminases are normal, ALP is normal, tests of hepatic synthetic and metabolic function are normal, and the platelet count is normal.    The patient notes fatigue, diffuse abdominal pain, back pain.  The patient has limitations in functional activities secondary to other medical problems that are not related to the liver disease.        ALLERGIES  Allergies   Allergen Reactions   ??? Latex Rash       MEDICATIONS  Current Outpatient Prescriptions   Medication Sig   ??? buprenorphine-naloxone (  SUBOXONE) 8-2 mg film sublingaul film 1 Film by SubLINGual route three (3) times daily for 30 days. For chronic pain  Fill on or after:  10/21/12,6/17/141   ??? LORazepam (ATIVAN) 0.5 mg tablet Take 1 Tab by mouth every eight (8) hours as needed for Anxiety.   ??? solifenacin (VESICARE) 10 mg tablet Take 10 mg by mouth daily.   ??? fluticasone (FLONASE) 50 mcg/actuation nasal spray nightly.   ??? aspirin delayed-release 81 mg tablet Take  by mouth daily.   ??? calcium carbonate (ANTACID EXTRA-STRENGTH) 200 mg calcium (500 mg) Chew Take 1 Tab by mouth daily.   ??? multivitamin (ONE A DAY) tablet Take 1 Tab by mouth daily.   ??? tadalafil (CIALIS,ADCIRCA) 20 mg tablet Take 1 Tab by mouth as needed.     No current facility-administered medications for this visit.       REVIEW OF SYSTEMS:  Systems related to all organ systems were reviewed.  All were negative except as noted above.    FAMILY HISTORY:  The father died of accident.    The mother died of natural causes.    There are no other persons in the family with HCV or liver disease.       SOCIAL HISTORY:  The patient is divorced.    The patient has 4 children, and 6 grandchildren.    The patient has never used tobacco products.    The patient has never consumed significant amounts  of alcohol.    The patient used to work in various business.  Currently a full time student studying mental health counceling.        PHYSICAL EXAMINATION:  BP 120/80   Pulse 70   Temp(Src) 98.1 ??F (36.7 ??C) (Tympanic)   Ht 5\' 10"  (1.778 m)   Wt 223 lb 8 oz (101.379 kg)   BMI 32.07 kg/m2  General: No acute distress.   Eyes: Sclera anicteric.   ENT: No oral lesions.  Thyroid normal.  Nodes: No adenopathy.   Skin: No spider angiomata.  No jaundice.  No palmar erythema.  Respiratory: Lungs clear to auscultation.   Cardiovascular: Regular heart rate.  No murmurs.  No JVD.  Abdomen: Soft non-tender.  Liver size normal to percussion/palpation.  Spleen not palpable. No obvious ascites.  Extremities: No edema.  No muscle wasting.  No gross arthritic changes.  Neurologic: Alert and oriented.  Cranial nerves grossly intact.  No asterixsis.    LABORATORY STUDIES:  [facility] Latest Ref Rng 11/12/2012 10/18/2012   WBC 3.4 - 10.8 x10E3/uL 5.0 4.8   ANC 1.4 - 7.0 x10E3/uL 2.2 2.0   HGB 12.6 - 17.7 g/dL 16.1 09.6   PLT 045 - 409 x10E3/uL 184 185   AST 0 - 40 IU/L 38 40   ALT 0 - 44 IU/L 37 36   Alk Phos 39 - 117 IU/L 60 63   Bili, Total 0.0 - 1.2 mg/dL 0.5 0.5   Bili, Direct 0.00 - 0.40 mg/dL 8.11    Albumin 3.5 - 5.5 g/dL 4.2 4.0   BUN 6 - 24 mg/dL 15 14   Creat 9.14 - 7.82 mg/dL 9.56 2.13   Na 086 - 578 mmol/L 143 139   K 3.5 - 5.2 mmol/L 4.6 4.5   Cl 97 - 108 mmol/L 106 102   CO2 19 - 28 mmol/L 25 29 (H)   Glucose 65 - 99 mg/dL 469 (H) 629 (H)     SEROLOGIES:  [facility] Latest Ref  Rng 11/12/2012   Hep A Ab, Total Negative Negative   Hep B Surface Ag Negative Negative   Hep B Core Ab, Total Negative Positive (A)   Hep C Genotype See Note 1b     Anti-HBsurface positive, HCV RNA Log 6.2 IU/ml    LIVER HISTOLOGY:  Not available or performed    ENDOSCOPIC PROCEDURES:  Not available or performed    RADIOLOGY:  Not available or performed    OTHER TESTING:  Not available or performed    ASSESSMENT AND PLAN:  Chronic HCV  with mild fibrosis by his history.      Have performed laboratory testing to monitor liver function and degree of liver injury.  This included BMP, Hepatic panel, CBC with platelet count.  Liver function is normal.  The platelet count is normal.  Based upon laboratory studies the patient does not appear to have advanced liver disease or cirrhosis.      Will perform and/or review results of HCV viral load, HCV genotype, to define the specific treatment and duration of treatment that will be required.      Will perform  serologic and virologic studies to assess for other causes of chronic liver disease.      Will perform imaging of the liver with Ultrasound.      The need to perform liver biopsy to assess degree of fibrosis and the need for treatment was discussed with the patient.  The risks of performing the liver biopsy including pain, puncture of the lung, gallbladder, intestine or kidney and bleeding were discussed.  Will defer liver biopsy for now.      The patient has not previously been treated for HCV.      The patient has HCV genotype 1B.  Discussed the treatment alternatives.  The patient can be treated now with peginterferon, sufosbuvir and ribavirin with SVR/cure rate of about 90% or wait until the fall of 2014 for an all oral multi-drug anti-viral regimen without interferon with SVR/cure rate of over 95%.    Also discussed participation in clinical trials for treatment of chronic HCV using all oral anti-viral regimen without interferon.  The patient would be willing to receive treatment in a clinical trial utilizing multiple oral antiviral agents instead of being treated with peginterferon, ribavirin and a single oral antiviral agent.      The patient would like to defer treatment until an all oral anti-viral regimen is available in October 2014 or be treated in a clinical trial using an all oral anti-viral regimen if eligible.    The patient was directed to continue all current medications at the  current dosages.  There are no contraindications for the patient to take any medications that are necessary for treatment of other medical issues.    The patient was counseled regarding alcohol consumption.      Vaccination for viral hepatitis A is recommended since the patient has no serologic evidence of previous exposure or vaccination with immunity.    Vaccination for viral hepatitis B is not required.  The patient has serologic evidence of prior exposure or vaccination with immunity.    Follow-up [facility] in 3 months to initiate HCV treatment.    Bryson Ha, MD  [facility]    [address] [facility], [address] [place]  [address]  [phone]

## 2012-11-16 LAB — CBC WITH AUTOMATED DIFF
ABS. BASOPHILS: 0 10*3/uL (ref 0.0–0.2)
ABS. EOSINOPHILS: 0.1 10*3/uL (ref 0.0–0.4)
ABS. IMM. GRANS.: 0 10*3/uL (ref 0.0–0.1)
ABS. MONOCYTES: 0.3 10*3/uL (ref 0.1–0.9)
ABS. NEUTROPHILS: 2.2 10*3/uL (ref 1.4–7.0)
Abs Lymphocytes: 2.4 10*3/uL (ref 0.7–3.1)
BASOPHILS: 0 % (ref 0–3)
EOSINOPHILS: 1 % (ref 0–5)
HCT: 44.1 % (ref 37.5–51.0)
HGB: 14.5 g/dL (ref 12.6–17.7)
IMMATURE GRANULOCYTES: 0 % (ref 0–2)
Lymphocytes: 49 % — ABNORMAL HIGH (ref 14–46)
MCH: 29.4 pg (ref 26.6–33.0)
MCHC: 32.9 g/dL (ref 31.5–35.7)
MCV: 89 fL (ref 79–97)
MONOCYTES: 6 % (ref 4–12)
NEUTROPHILS: 44 % (ref 40–74)
PLATELET: 184 10*3/uL (ref 155–379)
RBC: 4.94 x10E6/uL (ref 4.14–5.80)
RDW: 13.8 % (ref 12.3–15.4)
WBC: 5 10*3/uL (ref 3.4–10.8)

## 2012-11-16 LAB — BASIC METABOLIC PANEL (7)
BUN/Creatinine ratio: 18 (ref 9–20)
BUN: 15 mg/dL (ref 6–24)
CO2: 25 mmol/L (ref 19–28)
Chloride: 106 mmol/L (ref 97–108)
Creatinine: 0.84 mg/dL (ref 0.76–1.27)
GFR est AA: 112 mL/min/{1.73_m2} (ref >59)
GFR est non-AA: 97 mL/min/{1.73_m2} (ref >59)
Glucose: 100 mg/dL — ABNORMAL HIGH (ref 65–99)
Potassium: 4.6 mmol/L (ref 3.5–5.2)
Sodium: 143 mmol/L (ref 134–144)

## 2012-11-16 LAB — HEP B SURFACE AG: Hep B surface Ag screen: NEGATIVE

## 2012-11-16 LAB — HCV RT-PCR, QUANT (GRAPH)
HCV log10: 6.278 log10 IU/mL
HEPATITIS C QUANTITATION: 1896810 IU/mL

## 2012-11-16 LAB — HEP C GENOTYPE

## 2012-11-16 LAB — HEPATIC FUNCTION PANEL (6)
ALT (SGPT): 37 IU/L (ref 0–44)
AST (SGOT): 38 IU/L (ref 0–40)
Albumin: 4.2 g/dL (ref 3.5–5.5)
Alk. phosphatase: 60 IU/L (ref 39–117)
Bilirubin, direct: 0.23 mg/dL (ref 0.00–0.40)
Bilirubin, total: 0.5 mg/dL (ref 0.0–1.2)

## 2012-11-16 LAB — HEP A AB, TOTAL: Hep A Ab, total: NEGATIVE

## 2012-11-16 LAB — HEPATITIS B CORE AB, TOTAL: Hep B Core Ab, total: POSITIVE — AB

## 2012-11-16 LAB — HEP B SURFACE AB: HEP B SURFACE AB, QUAL: REACTIVE

## 2012-11-16 LAB — HEPATITIS B SURFACE ANTIBODY: HEP B SURFACE AB, QUAL, 006408: REACTIVE

## 2012-11-16 LAB — HEPATITIS B CORE ANTIBODY, TOTAL: Hep B Core Total Ab: POSITIVE — AB

## 2012-11-18 NOTE — Progress Notes (Signed)
HISTORY OF PRESENT ILLNESS  Aaron Reid is a 57 y.o. male.  HPI  Chronic hepatitis C  CLBP under pain mgmt  Neurogenic bladder  Review of Systems   Musculoskeletal: Positive for back pain.   All other systems reviewed and are negative.      Past Medical History   Diagnosis Date   ??? Cervical fusion syndrome 10/2003   ??? Chronic pain 1980     lower back    ??? Neurogenic bladder    ??? GERD (gastroesophageal reflux disease)    ??? Hepatitis C infection    ??? Back injury    ??? Neck injury    ??? Head injury    ??? Arthritis      Current Outpatient Prescriptions on File Prior to Visit   Medication Sig Dispense Refill   ??? buprenorphine-naloxone (SUBOXONE) 8-2 mg film sublingaul film 1 Film by SubLINGual route three (3) times daily for 30 days. For chronic pain  Fill on or after:  10/21/12,6/17/141  90 Film  1   ??? LORazepam (ATIVAN) 0.5 mg tablet Take 1 Tab by mouth every eight (8) hours as needed for Anxiety.  30 Tab  0   ??? solifenacin (VESICARE) 10 mg tablet Take 10 mg by mouth daily.       ??? fluticasone (FLONASE) 50 mcg/actuation nasal spray nightly.       ??? aspirin delayed-release 81 mg tablet Take  by mouth daily.       ??? calcium carbonate (ANTACID EXTRA-STRENGTH) 200 mg calcium (500 mg) Chew Take 1 Tab by mouth daily.       ??? multivitamin (ONE A DAY) tablet Take 1 Tab by mouth daily.       ??? tadalafil (CIALIS,ADCIRCA) 20 mg tablet Take 1 Tab by mouth as needed.  9 Tab  0     No current facility-administered medications on file prior to visit.     BP 117/87   Pulse 69   Resp 20   Ht 5\' 10"  (1.778 m)   Wt 222 lb 3.2 oz (100.789 kg)   BMI 31.88 kg/m2   SpO2 96%      Physical Exam   Vitals reviewed.  Constitutional: He appears well-developed and well-nourished. No distress.   Abdominal: Soft. Bowel sounds are normal. He exhibits no distension and no mass. There is no tenderness. There is no rebound and no guarding.   Musculoskeletal: He exhibits no edema and no tenderness.   Antalgic gait   Skin: He is not diaphoretic.    Psychiatric: He has a normal mood and affect. His behavior is normal. Judgment and thought content normal.     Reviewed consultant reports    ASSESSMENT and PLAN  clbp  Neurogenic bladder  Chronic hepatitis C  Plan  Refills  Refer to dr. Micheline Maze  celebrex

## 2012-11-18 NOTE — Progress Notes (Signed)
Aaron Reid is a 57 y.o. male in today to urology referral and medication review. Learning assessment complete; primary language is [place].

## 2012-11-20 NOTE — Telephone Encounter (Signed)
Referral and office notes faxed to Dr. Zenda Alpers

## 2012-11-26 NOTE — Telephone Encounter (Signed)
Try 1/2 cialis first (or 1/4 cialis if he has a good pill cutter)

## 2012-11-26 NOTE — Telephone Encounter (Signed)
Spoke with patient. He states the pamphlet information says he should not cut pills.    Please advise further.

## 2012-11-26 NOTE — Telephone Encounter (Signed)
It's OK to cut them.

## 2012-11-26 NOTE — Telephone Encounter (Signed)
Patient walked in. Cialis was written for 20 mg instead of 5 or 10. Patient would like to know if you can write script for another medication as soon as you can and contact him.

## 2012-11-27 NOTE — Telephone Encounter (Signed)
Spoke with patient and gave resulted note.

## 2012-12-10 NOTE — Telephone Encounter (Signed)
i don't think it would work for his back, maybe pain mgmt has some ideas

## 2012-12-10 NOTE — Telephone Encounter (Signed)
He needs a referral for annual eye exam but does not know who to go to. Also his Lidoderm patch was denied, order cream?

## 2012-12-10 NOTE — Telephone Encounter (Signed)
?   Dr. Ma Rings

## 2012-12-10 NOTE — Telephone Encounter (Signed)
I'll have him call pain management, what about opthalmology referral?

## 2012-12-11 NOTE — Telephone Encounter (Signed)
Left mess to call

## 2012-12-19 NOTE — Telephone Encounter (Signed)
Spoke with patient, he saw an opthalmologist yesterday, did not need a referral

## 2012-12-20 NOTE — Progress Notes (Signed)
Pt rescheduled due to long wait times

## 2012-12-24 DIAGNOSIS — Z981 Arthrodesis status: Secondary | ICD-10-CM | POA: Insufficient documentation
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
## 2012-12-24 NOTE — Progress Notes (Signed)
Pt here for f/u. Has had reactions to the Percocet and Fentanyl. He is currently on Suboxone from his previous pain clinic. Would like to discuss imaging results and treatment plan.

## 2013-01-14 NOTE — Telephone Encounter (Signed)
Butrans patch was approved by [employer] through 06/04/13. Faxed to pharmacy.

## 2013-02-13 ENCOUNTER — Telehealth

## 2013-02-13 NOTE — Telephone Encounter (Signed)
He never did see a urologist, would like a referral to Dr. Zenda Alpers

## 2013-02-14 NOTE — Telephone Encounter (Signed)
printed

## 2013-02-14 NOTE — Telephone Encounter (Signed)
Please generate referral

## 2013-02-14 NOTE — Telephone Encounter (Signed)
Fine with me.

## 2013-02-20 NOTE — Telephone Encounter (Signed)
Referral and office notes faxed to Dr. Zenda Alpers

## 2013-02-20 NOTE — Progress Notes (Signed)
Nursing Notes    Patient in office today for follow up.   Patient rates pain a 7/10 on pain scale. Pain is located in low back,knee, hip    Reviewed medications and counts as follows:      Rx Date filled Qty Dispensed Pill Count Last Dose Short   Butrans 01/14/13 4 2                                         Comments: Pt states he had adverse affect to Butrans Patch states" was sick on stomach and used twice and did not use again"  POC UDS NOT performed in office today    Any new labs or imaging since last appointment? Yes [facility]     Have you been to an emergency room (ER) or urgent care clinic since your last visit?  no           Have you been hospitalized since your last visit? no  If yes, where, when, and reason for visit?     Have you seen or consulted any other health care providers outside of the [facility]  since your last visit?  no    If yes, where, when, and reason for visit?   Amanda Pea LPN
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
## 2013-02-21 ENCOUNTER — Telehealth

## 2013-02-21 NOTE — Telephone Encounter (Signed)
printed

## 2013-02-21 NOTE — Telephone Encounter (Signed)
Suboxone was approved by [employer] until 08/20/2013. Called pt to let him know of approval - left vm. Faxed to [facility]

## 2013-02-21 NOTE — Telephone Encounter (Signed)
Patient needs a new Pain Management referral to Dr. Maggie Font. Current one is about to expire.

## 2013-02-27 ENCOUNTER — Encounter

## 2013-02-27 NOTE — Telephone Encounter (Signed)
Pt calling because his Suboxone script was order as SL tabs, however he was taking SL films which he would rather have.  I advised that I would ask the provider. He would need to return the script for tabs.

## 2013-02-27 NOTE — Telephone Encounter (Signed)
Pt will like call concerning u/s results.

## 2013-02-27 NOTE — Progress Notes (Signed)
Patient cancelled his appt for 02-25-13 and said that he will call back to resched. I notified the PCP, Dr Nathaneil Canary. I also reached out to the patient and called him today and gave him the number to call [phone]) when he is ready to reschedule the appt.

## 2013-03-03 NOTE — Telephone Encounter (Signed)
Have advised pt that script is ready. He needs to return the Suboxone script for tabs.  LVM

## 2013-03-12 NOTE — Progress Notes (Signed)
Chief Complaint   Patient presents with   ??? Sinus Infection   ??? Dizziness       SUBJECTIVE:   Aaron Reid is a 57 y.o. male who complains of getting water in his ear 3 days ago.  Used a qtip to try to get it out.  States he sat under [facility] and thinks he got sick.  Has R ear pain, sinus pressure, HA, nausea.  Took alka-seltzer plus with some relief of HA.  +dizziness.   Has h/o vertigo.  +fatigue.  No f/c.  No cough.    Review of Systems - ENT ROS: positive for - headaches, nasal congestion and sinus pain  Respiratory ROS: no cough, shortness of breath, or wheezing  Cardiovascular ROS: no chest pain or dyspnea on exertion  Gastrointestinal ROS: no abdominal pain, change in bowel habits, or black or bloody stools  Musculoskeletal ROS: negative  Neurological ROS: no TIA or stroke symptoms    Physical Examination:   BP 138/96   Pulse 75   Temp(Src) 98.2 ??F (36.8 ??C) (Oral)   Resp 19   Ht 5\' 10"  (1.778 m)   Wt 222 lb (100.699 kg)   BMI 31.85 kg/m2   SpO2 97%    Constitutional: Well developed, nourished, no distress, alert   HENT: Exterior ears and tympanic membranes normal bilaterally. Supple neck. No thyromegaly or lymphadenopathy. Oropharynx clear and moist mucous membranes.  +sinus inflammation   Eyes: Conjunctiva normal. PERRL.    CV: S1, S2.  RRR.  No murmurs/rubs.  No thrills palpated.  No carotid bruits.  Intact distal pulses.  No edema.   Pulm: No abnormalities on inspection.  Clear to auscultation bilaterally. No wheezing/rhonchi.  Normal effort.         Assessment/Plan  1. Sinusitis  - azithromycin (ZITHROMAX) 250 mg tablet; Take 2 tablets today, then take 1 tablet daily  Dispense: 6 tablet; Refill: 0    2. Vertigo  - meclizine (ANTIVERT) 25 mg tablet; Take 1 tablet by mouth three (3) times daily as needed for Dizziness for up to 10 days.  Dispense: 30 tablet; Refill: 0      Orders Placed This Encounter   ??? azithromycin (ZITHROMAX) 250 mg tablet     Sig: Take 2 tablets today, then take 1 tablet daily      Dispense:  6 tablet     Refill:  0   ??? meclizine (ANTIVERT) 25 mg tablet     Sig: Take 1 tablet by mouth three (3) times daily as needed for Dizziness for up to 10 days.     Dispense:  30 tablet     Refill:  0           Kerman Passey, MD

## 2013-03-12 NOTE — Progress Notes (Signed)
Aaron Reid is a 57 y.o. male here today for c/o sinus infection and vertigo.

## 2013-04-14 ENCOUNTER — Telehealth

## 2013-04-14 NOTE — Telephone Encounter (Signed)
Need updated referral (back dated for 6/10) for Dr. Larose Kells for diagnosis 070.54

## 2013-04-17 NOTE — Progress Notes (Signed)
[employer] identified patient as a candidate for one of their 3 Chronic Care Programs; however they were unable to enroll patient due to inability to contact the pt. This NN contacted the patient today and explained reason for call. Gave brief overview of programs, patient agrees on enrollment stating he can always use help with his health. Gave the patient [employer]'s enrollment phone number: [phone]. Also emailed my contact at [facility] with patient contact information to ensure enrollment into beneficial program is successful.

## 2013-05-19 NOTE — Progress Notes (Signed)
Aaron Reid is a 57 y.o. male  Pt here today for dizziness.      1. Have you been to the ER, urgent care clinic since your last visit?  Hospitalized since your last visit?No    2. Have you seen or consulted any other health care providers outside of the [facility] since your last visit?  Include any pap smears or colon screening. No

## 2013-05-19 NOTE — Progress Notes (Signed)
Aaron Reid is a 57 y.o. male and presents with Dizziness       Subjective:    Pt was seen almost 2 mos ago for sinus infection and vertigo.  States it improved after that, but has returned.  States every time he lays on L side, will become dizzy.  No n/v.  No numbness or weakness.    ROS:  Negative except as mentioned above    SH:  History   Substance Use Topics   ??? Smoking status: Never Smoker    ??? Smokeless tobacco: Never Used   ??? Alcohol Use: No         Medications/Allergies:  Current Outpatient Prescriptions on File Prior to Visit   Medication Sig Dispense Refill   ??? lorazepam (ATIVAN) 0.5 mg tablet Take  by mouth.       ??? solifenacin (VESICARE) 10 mg tablet Take 10 mg by mouth daily.       ??? Aspirin, Buffered 81 mg tab Take  by mouth.       ??? multivitamin (ONE A DAY) tablet Take 1 tablet by mouth daily.       ??? pantoprazole (PROTONIX) 20 mg tablet Take 1 Tab by mouth two (2) times a day.  180 Tab  3   ??? tadalafil (CIALIS,ADCIRCA) 20 mg tablet Take 1 Tab by mouth as needed.  30 Tab  0   ??? LORazepam (ATIVAN) 0.5 mg tablet Take 1 Tab by mouth every eight (8) hours as needed for Anxiety.  30 Tab  0   ??? fluticasone (FLONASE) 50 mcg/actuation nasal spray nightly.       ??? aspirin delayed-release 81 mg tablet Take  by mouth daily.       ??? calcium carbonate (ANTACID EXTRA-STRENGTH) 200 mg calcium (500 mg) Chew Take 1 Tab by mouth daily.         No current facility-administered medications on file prior to visit.      Allergies   Allergen Reactions   ??? Latex Rash   ??? Cymbalta [Duloxetine] Anxiety   ??? Duragesic [Fentanyl] Drowsiness     Altered LOC     Objective:  BP 110/70   Pulse 71   Temp(Src) 98.4 ??F (36.9 ??C) (Oral)   Resp 16   Ht 5\' 10"  (1.778 m)   Wt 230 lb (104.327 kg)   BMI 33 kg/m2   SpO2 98%  Constitutional: Well developed, nourished, no distress, alert   HENT: Exterior ears and tympanic membranes normal bilaterally. Supple neck. No thyromegaly or lymphadenopathy. Oropharynx clear and moist mucous  membranes.     Eyes: Conjunctiva normal. PERRL.    CV: S1, S2.  RRR.  No murmurs/rubs.  No thrills palpated.  No carotid bruits.  Intact distal pulses.  No edema.   Pulm: No abnormalities on inspection.  Clear to auscultation bilaterally. No wheezing/rhonchi.  Normal effort.     Neuro: speech normal.  Strength and sensation normal. 2+DTR.  +dix-hallpike.      Assessment/Plan:    Vertigo - meclizine.  Vertigo exercises provided.  If no better in 2 weeks, can refer to Dr. Susa Loffler or PT for vestibular rehab.    Orders Placed This Encounter   ??? meclizine (ANTIVERT) 25 mg tablet     Sig: Take 1 tablet by mouth three (3) times daily as needed for Dizziness.     Dispense:  60 tablet     Refill:  3

## 2013-05-19 NOTE — Patient Instructions (Addendum)
Vertigo: Exercises  Your Care Instructions  Here are some examples of typical rehabilitation exercises for your condition. Start each exercise slowly. Ease off the exercise if you start to have pain.  Your doctor or physical therapist will tell you when you can start these exercises and which ones will work best for you.  How to do the exercises  Note: Do these exercises twice a day. Try to progress to doing each head movement 15 to 20 times. Then try to do them with your eyes closed.  Exercise 1    1. Stand with a chair in front of you and a wall behind you. If you begin to fall, you may use them for support.  2. Stand with your feet together and your arms at your sides.  Exercise 2    Move your head side to side 10 times.  Exercise 3    Move your head diagonally up and down 10 times.  Exercise 4    Move your head diagonally up and down 10 times on the other side.  Follow-up care is a key part of your treatment and safety. Be sure to make and go to all appointments, and call your doctor if you are having problems. It's also a good idea to know your test results and keep a list of the medicines you take.   Where can you learn more?   Go to http://www.healthwise.net/[facility]  Enter F349 in the search box to learn more about "Vertigo: Exercises."   ?? 2006-2014 Healthwise, Incorporated. Care instructions adapted under license by [place] (which disclaims liability or warranty for this information). This care instruction is for use with your licensed healthcare professional. If you have questions about a medical condition or this instruction, always ask your healthcare professional. Healthwise, Incorporated disclaims any warranty or liability for your use of this information.  Content Version: 10.2.346038; Current as of: July 25, 2011

## 2013-06-17 NOTE — Progress Notes (Signed)
[facility]  Primary Care Office Visit - Problem-Oriented    DOB: 07/25/1955   Aaron Reid is a 58 y.o. male presenting for  Chief Complaint   Patient presents with   ??? Jaw Pain   ??? Ear Pain       Assessment/Plan:     Aaron Reid was seen today for jaw pain and ear pain.    Diagnoses and associated orders for this visit:    AOM (acute otitis media), left  - amoxicillin (AMOXIL) 500 mg capsule; Take 1 capsule by mouth two (2) times a day for 5 days.  - Patient is aware of AE of medications, handout given, all questions answered.   - take with food  - tylenol for pain  - keep ear dry, handout given   - follow up if sx persist or worsen    Allergic rhinitis, uncontrolled   - fluticasone (FLONASE) 50 mcg/actuation nasal spray; 1 spray in each nostril every am  - educated on daily use    Nasal sinus congestion  - pseudoephedrine (SUDAFED) 30 mg tablet; Take 1 tablet by mouth every six (6) hours as needed for Congestion.  - may use Zyrtec daily    Orders Placed This Encounter   ??? fluticasone (FLONASE) 50 mcg/actuation nasal spray     Sig: 1 spray in each nostril every am     Dispense:  1 Bottle     Refill:  2   ??? pseudoephedrine (SUDAFED) 30 mg tablet     Sig: Take 1 tablet by mouth every six (6) hours as needed for Congestion.     Dispense:  30 tablet     Refill:  0   ??? amoxicillin (AMOXIL) 500 mg capsule     Sig: Take 1 capsule by mouth two (2) times a day for 5 days.     Dispense:  10 capsule     Refill:  0         Karlton Lemonshley Delmas Faucett  FNP-C  06/17/2013, 1:02 PM    History:   Aaron LundLeon Gregory Neff is a 58 y.o. male presenting for Jaw Pain and Ear Pain    3 days ago started with left ear pain  [facility] was given zpack and Antivert, this seemed to help   When showering water in the ear, and goes outside causes ear aching   Discomfort is also in TMJ, affects him when he eats   Does not swim  No plane rides recently   No drainage from ear   No tooth pain, has seen dentist recently no  concerns  Has normal hearing, + tinnitus   Feels better when has cotton in ear   No fevers or chills  Reports seasonal allergies, + sinus pressure, runny nose, itchy eyes- uses flonase PRN, no allergy pill     Past Medical History   Diagnosis Date   ??? Cervical fusion syndrome 10/2003   ??? Chronic pain 1980     lower back    ??? LBP (low back pain) 12/24/2012   ??? DDD (degenerative disc disease), lumbar 12/24/2012   ??? S/P lumbar spinal fusion 12/24/2012   ??? Lumbar spondylosis 12/24/2012   ??? Neck pain 12/24/2012   ??? DDD (degenerative disc disease), cervical 12/24/2012   ??? S/P cervical spinal fusion 12/24/2012   ??? Spondylolisthesis of cervical region 12/24/2012   ??? Cervical spondylosis 12/24/2012   ??? Foraminal stenosis of cervical region 12/24/2012   ??? DJD (  degenerative joint disease), multiple sites 12/24/2012   ??? Musculoskeletal pain 12/24/2012   ??? Spasmodic torticollis 12/24/2012   ??? GAD (generalized anxiety disorder) 12/24/2012   ??? Hepatitis C 12/24/2012   ??? Insomnia 12/24/2012   ??? Opioid dependence 02/21/2013   ??? Cervical fusion syndrome 2005   ??? Chronic pain 1980     lower back   ??? Neurogenic bladder    ??? GERD (gastroesophageal reflux disease)    ??? Hepatitis C infection    ??? Back injury    ??? Neck injury    ??? Head injury    ??? Arthritis      Past Surgical History   Procedure Laterality Date   ??? Endoscopy, colon, diagnostic     ??? Hx orthopaedic       spinal fusion, lumbar, cervical   ??? Hx orthopaedic       spinal fusion, lumbar cervical      reports that he has never smoked. He has never used smokeless tobacco. He reports that he uses illicit drugs (Cocaine, Marijuana, and Other). He reports that he does not drink alcohol.  History   Smoking status   ??? Never Smoker    Smokeless tobacco   ??? Never Used       Family History   Problem Relation Age of Onset   ??? Heart Disease Sister    ??? Diabetes Brother    ??? Stroke Brother      Allergies   Allergen Reactions   ??? Latex Rash   ??? Cymbalta [Duloxetine] Anxiety   ??? Duragesic [Fentanyl] Drowsiness      Altered LOC       Problem List:      Patient Active Problem List    Diagnosis   ??? Neurogenic bladder   ??? Chronic hepatitis C   ??? Opioid dependence   ??? LBP (low back pain)   ??? DDD (degenerative disc disease), lumbar   ??? S/P lumbar spinal fusion   ??? Lumbar spondylosis   ??? Neck pain   ??? DDD (degenerative disc disease), cervical   ??? S/P cervical spinal fusion   ??? Spondylolisthesis of cervical region   ??? Cervical spondylosis   ??? Foraminal stenosis of cervical region   ??? DJD (degenerative joint disease), multiple sites   ??? Musculoskeletal pain   ??? Spasmodic torticollis   ??? GAD (generalized anxiety disorder)   ??? Hepatitis C   ??? GERD (gastroesophageal reflux disease)   ??? Insomnia   ??? Neurogenic bladder       Medications:     Current Outpatient Prescriptions   Medication Sig   ??? fluticasone (FLONASE) 50 mcg/actuation nasal spray 1 spray in each nostril every am   ??? pseudoephedrine (SUDAFED) 30 mg tablet Take 1 tablet by mouth every six (6) hours as needed for Congestion.   ??? amoxicillin (AMOXIL) 500 mg capsule Take 1 capsule by mouth two (2) times a day for 5 days.   ??? meclizine (ANTIVERT) 25 mg tablet Take 1 tablet by mouth three (3) times daily as needed for Dizziness.   ??? solifenacin (VESICARE) 10 mg tablet Take 10 mg by mouth daily.   ??? Aspirin, Buffered 81 mg tab Take  by mouth.   ??? multivitamin (ONE A DAY) tablet Take 1 tablet by mouth daily.   ??? pantoprazole (PROTONIX) 20 mg tablet Take 1 Tab by mouth two (2) times a day.   ??? tadalafil (CIALIS,ADCIRCA) 20 mg tablet Take 1 Tab by mouth as  needed.   ??? LORazepam (ATIVAN) 0.5 mg tablet Take 1 Tab by mouth every eight (8) hours as needed for Anxiety.   ??? aspirin delayed-release 81 mg tablet Take  by mouth daily.   ??? calcium carbonate (ANTACID EXTRA-STRENGTH) 200 mg calcium (500 mg) Chew Take 1 Tab by mouth daily.     No current facility-administered medications for this visit.       Review of Systems:     (positives in bold), all other systems reviewed and WNL   CONST:    fatigue, weight change, appetite change, fevers, chills  NEURO:   headaches, vision changes, dizziness, loss of consciousness  HEENT:  itchy eyes, runny eyes,     ear pain, ear pressure, ear popping, ear discharge/drainage, hearing change,      nosebleeds, sneezing, runny nose, nasal congestion, sinus pressure, change in sense of smell, sore throat, voice change or hoarse voice, dry mouth, toothache  CV:      chest pain, palpitations  PULM:  dyspnea, wheezing, cough  SKIN:        rashes, skin changes  ALLERGY: seasonal allergies, itchy eyes   Psych:  Suicidal ideation, homicidal ideation    Physical Assessment:     VS:  BP 122/80   Pulse 70   Temp(Src) 97.9 ??F (36.6 ??C) (Oral)   Resp 18   Ht 5\' 10"  (1.778 m)   Wt 228 lb (103.42 kg)   BMI 32.71 kg/m2   SpO2 100%    General:   Well-developed, well-nourished male     Head:    Normocephalic, good dentition, no gum tenderness or loose teeth, oropharynx   otherwise WNL. No crepitus of TMJ.     No facial asymmetry noted.  Ears:  Right EAC/IAC WNL, Left EAC erythematous + edema, TM erythematous, + purulent effusion.   Hearing normal bilaterally to finger rub. NTTP of mastoid, auricle or pinna.   Cardiovasc:   No JVD.  RRR, no MRG. No thrills or bruits.   Pulmonary:   Lungs clear bilaterally.  Normal respiratory effort. No wheeze or crackles.   Neuro:   Alert and oriented,   Skin:    No rashes noted. Skin warm, dry, intact    Lymph:   No cervical, pre- or post-auricular, submandibular, occipital lymphadenopathy.    Psych:  Dressed appropriately for the weather, pleasant and conversant.  Affect, mood & judgment appropriate.

## 2013-06-17 NOTE — Patient Instructions (Addendum)
To do:  - take flonase daily  - sudafed around the clock for next 5 days  - amox for next five days, take with food  - come back if pain persists, or continued jaw pain    Keeping Ears Dry: After Your Visit  Your Care Instructions  Your doctor wants you to keep water from getting into your ears. You may need to do this because of a ruptured eardrum, an ear infection, or other ear problems.  Follow-up care is a key part of your treatment and safety. Be sure to make and go to all appointments, and call your doctor if you are having problems. It???s also a good idea to know your test results and keep a list of the medicines you take.  How can you care for yourself at home?  ?? Take baths until your doctor says you can take showers again. Avoid getting water in the ear until after the problem clears up. Ask your doctor if you should use earplugs to keep water out of your ears.  ?? Do not swim until your doctor says you can.  ?? If you get water in your ears, turn your head to each side and pull the earlobe in different directions. This will help the water run out. If your ears are still wet, use a hair dryer set on the lowest heat. Hold the dryer several inches from your ear.  ?? Use your medicines exactly as prescribed. Call your doctor if you think you are having a problem with your medicine. Do not put drops in your ears unless your doctor prescribes them.  When should you call for help?  Watch closely for changes in your health, and be sure to contact your doctor if:  ?? You get water in your ears and you cannot get it out.   Where can you learn more?   Go to [email or web address]://www.healthwise.net/[email or web address]  Enter [id number] in the search box to learn more about "Keeping Ears Dry: After Your Visit."   ?? 2006-2014 Healthwise, Incorporated. Care instructions adapted under license by [facility] [place] (which disclaims liability or warranty for this information). This care instruction is for use with your licensed healthcare professional. If you have  questions about a medical condition or this instruction, always ask your healthcare professional. Healthwise, Incorporated disclaims any warranty or liability for your use of this information.  Content Version: 10.2.346038; Current as of: April 19, 2012              Ear Infection (Otitis Media): After Your Visit  Your Care Instructions     An ear infection may start with a cold and affect the middle ear (otitis media). It can hurt a lot. Most ear infections clear up on their own in a couple of days. Most often you will not need antibiotics. This is because many ear infections are caused by a virus. Antibiotics don't work against a virus. Regular doses of pain medicines are the best way to reduce your fever and help you feel better.  Follow-up care is a key part of your treatment and safety. Be sure to make and go to all appointments, and call your doctor if you are having problems. It's also a good idea to know your test results and keep a list of the medicines you take.  How can you care for yourself at home?  ?? Take pain medicines exactly as directed.  ?? If the doctor gave you a prescription medicine for pain, take it as  prescribed.  ?? If you are not taking a prescription pain medicine, take an over-the-counter medicine, such as acetaminophen (Tylenol), ibuprofen (Advil, Motrin), or naproxen (Aleve). Read and follow all instructions on the label.  ?? Do not take two or more pain medicines at the same time unless the doctor told you to. Many pain medicines have acetaminophen, which is Tylenol. Too much acetaminophen (Tylenol) can be harmful.  ?? Plan to take a full dose of pain reliever before bedtime. Getting enough sleep will help you get better.  ?? Try a warm, moist washcloth on the ear. It may help relieve pain.  ?? If your doctor prescribed antibiotics, take them as directed. Do not stop taking them just because you feel better. You need to take the full course of antibiotics.  When should you call for help?   Call your doctor now or seek immediate medical care if:  ?? You have new or increasing ear pain.  ?? You have new or increasing pus or blood draining from your ear.  ?? You have a fever with a stiff neck or a severe headache.  Watch closely for changes in your health, and be sure to contact your doctor if:  ?? You have new or worse symptoms.  ?? You are not getting better after taking an antibiotic for 2 days.   Where can you learn more?   Go to [facility]  Enter [phone] in the search box to learn more about "Ear Infection (Otitis Media): After Your Visit."   ?? 2006-2014 Healthwise, Incorporated. Care instructions adapted under license by [employer] (which disclaims liability or warranty for this information). This care instruction is for use with your licensed healthcare professional. If you have questions about a medical condition or this instruction, always ask your healthcare professional. Healthwise, Incorporated disclaims any warranty or liability for your use of this information.  Content Version: 10.2.346038; Current as of: March 12, 2012            Fluticasone (Into the nose)   Fluticasone (floo-TIK-a-sone)  Treats allergy symptoms, such as runny or stuffy nose. This medicine is a corticosteroid.   Brand Name(s):Flonase, Novaplus Fluticasone Propionate, Veramyst   There may be other brand names for this medicine.  When This Medicine Should Not Be Used:   This medicine is not right for everyone. Do not use if you had an allergic reaction to fluticasone.  How to Use This Medicine:   Spray  ?? Take your medicine as directed.  ?? This medicine is for use only in the nose. Do not get any of it in your eyes or on your skin. If it does get on these areas, rinse it off right away.  ?? Prime the spray: Release 6 test sprays into the air away from the face, or pump the bottle until some of the medicine sprays out. Now it is ready to use. Prime the spray if it has not been used for more than 7 days  (or 30 days for Veramyst??) or if the cap has been left off the bottle for 5 days or longer.  ?? Shake the medicine well just before each use.  ?? Before using the medicine, gently blow your nose to clear the nostrils.  ?? After using the nasal spray, wipe the tip of the bottle with a clean tissue and put the cap back on.  ?? You may need to use this medicine for a few days before you start to feel better.  ??  Read and follow the patient instructions that come with this medicine. Talk to your doctor or pharmacist if you have any questions.  ?? Missed dose: Take a dose as soon as you remember. If it is almost time for your next dose, wait until then and take a regular dose. Do not take extra medicine to make up for a missed dose.  ?? Keep the bottle tightly closed when not using it. Store at room temperature, away from heat and direct light. Do not freeze or refrigerate. Throw this medicine away after you use 120 sprays.  Drugs and Foods to Avoid:   Ask your doctor or pharmacist before using any other medicine, including over-the-counter medicines, vitamins, and herbal products.  ?? Do not use this medicine together with ritonavir.  ?? Some foods and medicines can affect how fluticasone works. Tell your doctor if you are using ketoconazole.  Warnings While Using This Medicine:   ?? Tell your doctor if you are pregnant or breastfeeding, or if you have liver disease, asthma, an infection, or a history of cataracts or glaucoma. Make sure your doctor knows if you have had nose surgery, a nose injury, or a recent infection in your nose.  ?? This medicine may cause the following problems:  ?? Holes or ulcers inside the nose  ?? Slow wound healing  ?? Cataracts or glaucoma  ?? Problems with the adrenal glands  ?? Slow growth in children  ?? Avoid people who are sick or have infections. Tell your doctor right away if you think you have been exposed to measles or chickenpox.  ?? Your doctor will check your progress and the effects of this  medicine at regular visits. Keep all appointments.  ?? Call your doctor if your symptoms do not improve or if they get worse.  ?? Keep all medicine out of the reach of children. Never share your medicine with anyone.  Possible Side Effects While Using This Medicine:   Call your doctor right away if you notice any of these side effects:  ?? Allergic reaction: Itching or hives, swelling in your face or hands, swelling or tingling in your mouth or throat, chest tightness, trouble breathing  ?? Burning, redness, swelling, or irritation around or inside your nose  ?? Eye pain or vision changes  ?? Fever, chills, cough, sore throat, and body aches  ?? Heavy nosebleeds  ?? Sores or white patches inside the nose or mouth  ?? Tiredness, weakness, dizziness  If you notice other side effects that you think are caused by this medicine, tell your doctor.   Call your doctor for medical advice about side effects. You may report side effects to FDA at 1-800-FDA-1088  ?? 2014 [facility]. Information is for End User's use only and may not be sold, redistributed or otherwise used for commercial purposes.  The above information is an educational aid only. It is not intended as medical advice for individual conditions or treatments. Talk to your doctor, nurse or pharmacist before following any medical regimen to see if it is safe and effective for you.

## 2013-06-17 NOTE — Progress Notes (Signed)
Aaron Reid is a 58 y.o. male presents today c/o ear pain, jaw pain, and swelling ongoing about 3 days, and states he hears crackles when he is breathing.

## 2013-06-18 NOTE — Telephone Encounter (Signed)
Patient was in yesterday for an appointment w/ Morrie SheldonAshley an would like to extend his letter for school to 06/23/13 in order to cover for the school day he missed today on 06/18/13. His next day of school will be Monday, 06/23/13. Patient would like letter faxed to [phone] Attn: Theodoro GristLeon Schrade

## 2013-06-18 NOTE — Telephone Encounter (Signed)
Letter printed. Faxed to [employer].

## 2013-07-04 NOTE — Progress Notes (Signed)
HISTORY OF PRESENT ILLNESS  Aaron Reid is a 58 y.o. male.  HPI  Chronic hep C stable  clbp stable  oab stable  Review of Systems   Genitourinary: Positive for frequency.   All other systems reviewed and are negative.      Past Medical History   Diagnosis Date   ??? Cervical fusion syndrome 10/2003   ??? Chronic pain 1980     lower back    ??? LBP (low back pain) 12/24/2012   ??? DDD (degenerative disc disease), lumbar 12/24/2012   ??? S/P lumbar spinal fusion 12/24/2012   ??? Lumbar spondylosis 12/24/2012   ??? Neck pain 12/24/2012   ??? DDD (degenerative disc disease), cervical 12/24/2012   ??? S/P cervical spinal fusion 12/24/2012   ??? Spondylolisthesis of cervical region 12/24/2012   ??? Cervical spondylosis 12/24/2012   ??? Foraminal stenosis of cervical region 12/24/2012   ??? DJD (degenerative joint disease), multiple sites 12/24/2012   ??? Musculoskeletal pain 12/24/2012   ??? Spasmodic torticollis 12/24/2012   ??? GAD (generalized anxiety disorder) 12/24/2012   ??? Hepatitis C 12/24/2012   ??? Insomnia 12/24/2012   ??? Opioid dependence 02/21/2013   ??? Cervical fusion syndrome 2005   ??? Chronic pain 1980     lower back   ??? Neurogenic bladder    ??? GERD (gastroesophageal reflux disease)    ??? Hepatitis C infection    ??? Back injury    ??? Neck injury    ??? Head injury    ??? Arthritis      Current outpatient prescriptions:fluticasone (FLONASE) 50 mcg/actuation nasal spray, 1 spray in each nostril every am, Disp: 1 Bottle, Rfl: 2;  pseudoephedrine (SUDAFED) 30 mg tablet, Take 1 tablet by mouth every six (6) hours as needed for Congestion., Disp: 30 tablet, Rfl: 0;  meclizine (ANTIVERT) 25 mg tablet, Take 1 tablet by mouth three (3) times daily as needed for Dizziness., Disp: 60 tablet, Rfl: 3  solifenacin (VESICARE) 10 mg tablet, Take 10 mg by mouth daily., Disp: , Rfl: ;  Aspirin, Buffered 81 mg tab, Take  by mouth., Disp: , Rfl: ;  multivitamin (ONE A DAY) tablet, Take 1 tablet by mouth daily., Disp: , Rfl: ;  pantoprazole (PROTONIX) 20 mg tablet, Take 1 Tab by  mouth two (2) times a day., Disp: 180 Tab, Rfl: 3;  tadalafil (CIALIS,ADCIRCA) 20 mg tablet, Take 1 Tab by mouth as needed., Disp: 30 Tab, Rfl: 0  LORazepam (ATIVAN) 0.5 mg tablet, Take 1 Tab by mouth every eight (8) hours as needed for Anxiety., Disp: 30 Tab, Rfl: 0;  aspirin delayed-release 81 mg tablet, Take  by mouth daily., Disp: , Rfl: ;  calcium carbonate (ANTACID EXTRA-STRENGTH) 200 mg calcium (500 mg) Chew, Take 1 Tab by mouth daily., Disp: , Rfl:   BP 122/85   Pulse 84   Temp(Src) 97.7 ??F (36.5 ??C) (Oral)   Ht 5\' 10"  (1.778 m)   Wt 236 lb (107.049 kg)   BMI 33.86 kg/m2   SpO2 98%      Physical Exam   Vitals reviewed.  Constitutional: He appears well-developed and well-nourished. No distress.   Eyes: Conjunctivae are normal. Pupils are equal, round, and reactive to light. No scleral icterus.   Neck: Normal range of motion. Neck supple. No thyromegaly present.   Cardiovascular: Normal rate, regular rhythm, normal heart sounds and intact distal pulses.  Exam reveals no gallop and no friction rub.    No murmur heard.  Pulmonary/Chest:  Effort normal and breath sounds normal. No respiratory distress. He has no wheezes. He has no rales. He exhibits no tenderness.   Abdominal: Soft. Bowel sounds are normal. He exhibits no distension and no mass. There is no tenderness. There is no rebound and no guarding.   Genitourinary: Rectum normal, prostate normal and penis normal. Guaiac negative stool. No penile tenderness.   Musculoskeletal: Normal range of motion. He exhibits no edema and no tenderness.   Lymphadenopathy:     He has no cervical adenopathy.   Skin: Skin is warm and dry. No rash noted. He is not diaphoretic. No erythema. No pallor.       ASSESSMENT and PLAN  chronic hep c  oab  clbp  Plan  Labs

## 2013-07-04 NOTE — Progress Notes (Signed)
This is a Subsequent Medicare Annual Wellness Visit providing Personalized Prevention Plan Services (PPPS) (Performed 12 months after initial AWV and PPPS )    I have reviewed the patient's medical history in detail and updated the computerized patient record.     History     Past Medical History   Diagnosis Date   ??? Cervical fusion syndrome 10/2003   ??? Chronic pain 1980     lower back    ??? LBP (low back pain) 12/24/2012   ??? DDD (degenerative disc disease), lumbar 12/24/2012   ??? S/P lumbar spinal fusion 12/24/2012   ??? Lumbar spondylosis 12/24/2012   ??? Neck pain 12/24/2012   ??? DDD (degenerative disc disease), cervical 12/24/2012   ??? S/P cervical spinal fusion 12/24/2012   ??? Spondylolisthesis of cervical region 12/24/2012   ??? Cervical spondylosis 12/24/2012   ??? Foraminal stenosis of cervical region 12/24/2012   ??? DJD (degenerative joint disease), multiple sites 12/24/2012   ??? Musculoskeletal pain 12/24/2012   ??? Spasmodic torticollis 12/24/2012   ??? GAD (generalized anxiety disorder) 12/24/2012   ??? Hepatitis C 12/24/2012   ??? Insomnia 12/24/2012   ??? Opioid dependence 02/21/2013   ??? Cervical fusion syndrome 2005   ??? Chronic pain 1980     lower back   ??? Neurogenic bladder    ??? GERD (gastroesophageal reflux disease)    ??? Hepatitis C infection    ??? Back injury    ??? Neck injury    ??? Head injury    ??? Arthritis       Past Surgical History   Procedure Laterality Date   ??? Endoscopy, colon, diagnostic     ??? Hx orthopaedic       spinal fusion, lumbar, cervical   ??? Hx orthopaedic       spinal fusion, lumbar cervical     Current Outpatient Prescriptions   Medication Sig Dispense Refill   ??? fluticasone (FLONASE) 50 mcg/actuation nasal spray 1 spray in each nostril every am  1 Bottle  2   ??? pseudoephedrine (SUDAFED) 30 mg tablet Take 1 tablet by mouth every six (6) hours as needed for Congestion.  30 tablet  0   ??? meclizine (ANTIVERT) 25 mg tablet Take 1 tablet by mouth three (3) times daily as needed for Dizziness.  60 tablet  3   ??? solifenacin  (VESICARE) 10 mg tablet Take 10 mg by mouth daily.       ??? Aspirin, Buffered 81 mg tab Take  by mouth.       ??? multivitamin (ONE A DAY) tablet Take 1 tablet by mouth daily.       ??? pantoprazole (PROTONIX) 20 mg tablet Take 1 Tab by mouth two (2) times a day.  180 Tab  3   ??? tadalafil (CIALIS,ADCIRCA) 20 mg tablet Take 1 Tab by mouth as needed.  30 Tab  0   ??? LORazepam (ATIVAN) 0.5 mg tablet Take 1 Tab by mouth every eight (8) hours as needed for Anxiety.  30 Tab  0   ??? aspirin delayed-release 81 mg tablet Take  by mouth daily.       ??? calcium carbonate (ANTACID EXTRA-STRENGTH) 200 mg calcium (500 mg) Chew Take 1 Tab by mouth daily.         Allergies   Allergen Reactions   ??? Latex Rash   ??? Cymbalta [Duloxetine] Anxiety   ??? Duragesic [Fentanyl] Drowsiness     Altered LOC     Family  History   Problem Relation Age of Onset   ??? Heart Disease Sister    ??? Diabetes Brother    ??? Stroke Brother      History   Substance Use Topics   ??? Smoking status: Never Smoker    ??? Smokeless tobacco: Never Used   ??? Alcohol Use: No     Patient Active Problem List   Diagnosis Code   ??? LBP (low back pain) 724.2   ??? DDD (degenerative disc disease), lumbar 722.52   ??? S/P lumbar spinal fusion V45.4   ??? Lumbar spondylosis 721.3   ??? Neck pain 723.1   ??? DDD (degenerative disc disease), cervical 722.4   ??? S/P cervical spinal fusion V45.4   ??? Spondylolisthesis of cervical region 738.4   ??? Cervical spondylosis 721.0   ??? Foraminal stenosis of cervical region 723.0   ??? DJD (degenerative joint disease), multiple sites 715.89   ??? Musculoskeletal pain 729.1   ??? Spasmodic torticollis 333.83   ??? GAD (generalized anxiety disorder) 300.02   ??? Hepatitis C 070.70   ??? GERD (gastroesophageal reflux disease) 530.81   ??? Insomnia 780.52   ??? Neurogenic bladder 596.54   ??? Opioid dependence 304.00   ??? Neurogenic bladder 596.54   ??? Chronic hepatitis C 070.54       Depression Risk Factor Screening:     PHQ 2 / 9, over the last two weeks 10/18/2012   Little interest or  pleasure in doing things Not at all   Feeling down, depressed or hopeless Not at all   Total Score PHQ 2 0     Alcohol Risk Factor Screening:   On any occasion during the past 3 months, have you had more than 4 drinks containing alcohol?  No    Do you average more than 14 drinks per week?  No      Functional Ability and Level of Safety:     Hearing Loss   normal-to-mild    Activities of Daily Living   Self-care.   Requires assistance with: no ADLs    Fall Risk     Abuse Screen   Patient is not abused    Review of Systems   A comprehensive review of systems was negative except for: Musculoskeletal: positive for back pain    Physical Examination     Evaluation of Cognitive Function:  Mood/affect:  neutral  Appearance: age appropriate  Family member/caregiver input: none    BP 122/85   Pulse 84   Temp(Src) 97.7 ??F (36.5 ??C) (Oral)   Ht 5\' 10"  (1.778 m)   Wt 236 lb (107.049 kg)   BMI 33.86 kg/m2   SpO2 98%  General appearance: alert, cooperative, no distress, appears stated age  Head: Normocephalic, without obvious abnormality, atraumatic  Eyes: negative  Throat: Lips, mucosa, and tongue normal. Teeth and gums normal  Neck: supple, symmetrical, trachea midline, no adenopathy, thyroid: not enlarged, symmetric, no tenderness/mass/nodules, no carotid bruit and no JVD  Lungs: clear to auscultation bilaterally  Chest wall: no tenderness  Heart: regular rate and rhythm, S1, S2 normal, no murmur, click, rub or gallop  Abdomen: soft, non-tender. Bowel sounds normal. No masses,  no organomegaly  Male genitalia: normal  Rectal: Normal tone, normal prostate, no masses or tenderness  Extremities: extremities normal, atraumatic, no cyanosis or edema  Pulses: 2+ and symmetric  Skin: Skin color, texture, turgor normal. No rashes or lesions  Lymph nodes: Cervical, supraclavicular, and axillary nodes normal.  Neurologic: Grossly  normal    Patient Care Team:  Donzetta MattersAlessio Tinaya Ceballos, MD as PCP - General (Internal Medicine)  Rosie FateJin X Zhang  (Gastroenterology)  Donzetta MattersAlessio Thiago Ragsdale, MD (Internal Medicine)    Advice/Referrals/Counseling   Education and counseling provided:  Are appropriate based on today's review and evaluation  End-of-Life planning (with patient's consent)  Pneumococcal Vaccine  Influenza Vaccine  Prostate cancer screening tests (PSA, covered annually)  Colorectal cancer screening tests    Assessment/Plan   current treatment plan is effective, no change in therapy.

## 2013-07-19 LAB — CBC WITH AUTOMATED DIFF
ABS. BASOPHILS: 0 10*3/uL (ref 0.0–0.2)
ABS. EOSINOPHILS: 0.1 10*3/uL (ref 0.0–0.4)
ABS. IMM. GRANS.: 0 10*3/uL (ref 0.0–0.1)
ABS. MONOCYTES: 0.4 10*3/uL (ref 0.1–0.9)
ABS. NEUTROPHILS: 1.8 10*3/uL (ref 1.4–7.0)
Abs Lymphocytes: 3.3 10*3/uL — ABNORMAL HIGH (ref 0.7–3.1)
BASOPHILS: 0 %
EOSINOPHILS: 1 %
HCT: 41.8 % (ref 37.5–51.0)
HGB: 14.4 g/dL (ref 12.6–17.7)
IMMATURE GRANULOCYTES: 0 %
Lymphocytes: 60 %
MCH: 29.8 pg (ref 26.6–33.0)
MCHC: 34.4 g/dL (ref 31.5–35.7)
MCV: 86 fL (ref 79–97)
MONOCYTES: 7 %
NEUTROPHILS: 32 %
PLATELET: 169 10*3/uL (ref 150–379)
RBC: 4.84 x10E6/uL (ref 4.14–5.80)
RDW: 13.9 % (ref 12.3–15.4)
WBC: 5.6 10*3/uL (ref 3.4–10.8)

## 2013-07-19 LAB — LIPID PANEL
Cholesterol, total: 134 mg/dL (ref 100–199)
HDL Cholesterol: 45 mg/dL (ref >39)
LDL, calculated: 67 mg/dL (ref 0–99)
Triglyceride: 108 mg/dL (ref 0–149)
VLDL, calculated: 22 mg/dL (ref 5–40)

## 2013-07-19 LAB — METABOLIC PANEL, COMPREHENSIVE
A-G Ratio: 1.2 (ref 1.1–2.5)
ALT (SGPT): 37 IU/L (ref 0–44)
AST (SGOT): 43 IU/L — ABNORMAL HIGH (ref 0–40)
Albumin: 4 g/dL (ref 3.5–5.5)
Alk. phosphatase: 64 IU/L (ref 39–117)
BUN/Creatinine ratio: 14 (ref 9–20)
BUN: 13 mg/dL (ref 6–24)
Bilirubin, total: 0.6 mg/dL (ref 0.0–1.2)
CO2: 30 mmol/L — ABNORMAL HIGH (ref 18–29)
Calcium: 9 mg/dL (ref 8.7–10.2)
Chloride: 103 mmol/L (ref 97–108)
Creatinine: 0.94 mg/dL (ref 0.76–1.27)
GFR est AA: 103 mL/min/{1.73_m2} (ref >59)
GFR est non-AA: 89 mL/min/{1.73_m2} (ref >59)
GLOBULIN, TOTAL: 3.4 g/dL (ref 1.5–4.5)
Glucose: 110 mg/dL — ABNORMAL HIGH (ref 65–99)
Potassium: 4.6 mmol/L (ref 3.5–5.2)
Protein, total: 7.4 g/dL (ref 6.0–8.5)
Sodium: 143 mmol/L (ref 134–144)

## 2013-07-19 LAB — CVD REPORT: PDF IMAGE: 0

## 2013-07-19 LAB — PSA, DIAGNOSTIC (PROSTATE SPECIFIC AG): Prostate Specific Ag: 0.7 ng/mL (ref 0.0–4.0)

## 2013-07-19 LAB — TSH 3RD GENERATION: TSH: 1.75 u[IU]/mL (ref 0.450–4.500)

## 2013-07-19 NOTE — Progress Notes (Signed)
Quick Note:    Call, labs are in acceptable range  ______

## 2013-07-21 NOTE — Progress Notes (Signed)
Quick Note:    Left message for patient to call back.  ______

## 2013-07-21 NOTE — Progress Notes (Signed)
Quick Note:    Patient aware of results.  ______

## 2013-09-11 NOTE — Telephone Encounter (Signed)
Patient called requesting medication refill. Chart reviewed and noted last appointment 02/2013. He has an appointment scheduled. He was made aware that an appointment is needed for future medication refills.

## 2013-09-24 MED ORDER — GUAIFENESIN ER 1,200 MG TABLET,EXTENDED RELEASE 12HR MPHASE
1200 mg | ORAL_TABLET | Freq: Two times a day (BID) | ORAL | Status: DC
Start: 2013-09-24 — End: 2014-06-24

## 2013-09-24 MED ORDER — CEFDINIR 300 MG CAP
300 mg | ORAL_CAPSULE | Freq: Two times a day (BID) | ORAL | Status: AC
Start: 2013-09-24 — End: 2013-10-04

## 2013-09-24 NOTE — Progress Notes (Signed)
Aaron Reid is a 58 y.o. male c/o sinus pressure behind his eyes, back pain, and ear drainage. 1. Have you been to the ER, urgent care clinic since your last visit?  Hospitalized since your last visit?No    2. Have you seen or consulted any other health care providers outside of the [facility] since your last visit?  Include any pap smears or colon screening. No

## 2013-09-24 NOTE — Progress Notes (Signed)
HISTORY OF PRESENT ILLNESS  Aaron Reid is a 58 y.o. male.  HPI  C/o sinus congestion  C/o balance  Review of Systems   HENT: Positive for congestion.    All other systems reviewed and are negative.    Past Medical History   Diagnosis Date   ??? Cervical fusion syndrome 10/2003   ??? Chronic pain 1980     lower back    ??? LBP (low back pain) 12/24/2012   ??? DDD (degenerative disc disease), lumbar 12/24/2012   ??? S/P lumbar spinal fusion 12/24/2012   ??? Lumbar spondylosis 12/24/2012   ??? Neck pain 12/24/2012   ??? DDD (degenerative disc disease), cervical 12/24/2012   ??? S/P cervical spinal fusion 12/24/2012   ??? Spondylolisthesis of cervical region 12/24/2012   ??? Cervical spondylosis 12/24/2012   ??? Foraminal stenosis of cervical region 12/24/2012   ??? DJD (degenerative joint disease), multiple sites 12/24/2012   ??? Musculoskeletal pain 12/24/2012   ??? Spasmodic torticollis 12/24/2012   ??? GAD (generalized anxiety disorder) 12/24/2012   ??? Hepatitis C 12/24/2012   ??? Insomnia 12/24/2012   ??? Opioid dependence (HCC) 02/21/2013   ??? Cervical fusion syndrome 2005   ??? Chronic pain 1980     lower back   ??? Neurogenic bladder    ??? GERD (gastroesophageal reflux disease)    ??? Hepatitis C infection    ??? Back injury    ??? Neck injury    ??? Head injury    ??? Arthritis      Current Outpatient Prescriptions on File Prior to Visit   Medication Sig Dispense Refill   ??? fluticasone (FLONASE) 50 mcg/actuation nasal spray 1 spray in each nostril every am  1 Bottle  2   ??? pseudoephedrine (SUDAFED) 30 mg tablet Take 1 tablet by mouth every six (6) hours as needed for Congestion.  30 tablet  0   ??? meclizine (ANTIVERT) 25 mg tablet Take 1 tablet by mouth three (3) times daily as needed for Dizziness.  60 tablet  3   ??? solifenacin (VESICARE) 10 mg tablet Take 10 mg by mouth daily.       ??? Aspirin, Buffered 81 mg tab Take  by mouth.       ??? multivitamin (ONE A DAY) tablet Take 1 tablet by mouth daily.       ??? pantoprazole (PROTONIX) 20 mg tablet Take 1 Tab by mouth two (2)  times a day.  180 Tab  3   ??? tadalafil (CIALIS,ADCIRCA) 20 mg tablet Take 1 Tab by mouth as needed.  30 Tab  0   ??? LORazepam (ATIVAN) 0.5 mg tablet Take 1 Tab by mouth every eight (8) hours as needed for Anxiety.  30 Tab  0   ??? aspirin delayed-release 81 mg tablet Take  by mouth daily.       ??? calcium carbonate (ANTACID EXTRA-STRENGTH) 200 mg calcium (500 mg) Chew Take 1 Tab by mouth daily.         No current facility-administered medications on file prior to visit.     BP 137/90   Pulse 72   Temp(Src) 97.4 ??F (36.3 ??C) (Oral)   Resp 18   Ht 5\' 10"  (1.778 m)   Wt 231 lb (104.781 kg)   BMI 33.15 kg/m2   SpO2 96%      Physical Exam   Constitutional: He appears well-developed and well-nourished. No distress.   HENT:   Head: Normocephalic and atraumatic.   Nose:  Nose normal.   Mouth/Throat: No oropharyngeal exudate.   Tm dull x 2   Skin: He is not diaphoretic.   Vitals reviewed.      ASSESSMENT and PLAN  sinusitis   Plan  Cefdinir/probiotic  mucinex

## 2013-11-13 NOTE — Telephone Encounter (Signed)
[facility], he is going for his 6 month follow up and needs a new referral his previous one has expired. His appointment is tomorrow.   Has [employer]

## 2013-11-14 LAB — AMB POC DRUG SCREEN (G0434): OPIATES UR POC: POSITIVE

## 2013-11-14 MED ORDER — BUPRENORPHINE-NALOXONE 8 MG-2 MG SUBLINGUAL FILM
8-2 mg | ORAL_FILM | Freq: Every day | SUBLINGUAL | Status: DC
Start: 2013-11-14 — End: 2014-02-06

## 2013-11-14 NOTE — Progress Notes (Signed)
Nursing Notes    Patient presents to the office today in follow-up.   Patient rates his pain at 8/10 on the numerical pain scale.     Reviewed medications with counts as follows:    Rx Date filled Qty Dispensed Pill Count Last Dose Short   Pt has not been seen since September of 2014                                      POC UDS was performed in office today.    Any new labs or imaging since last appointment? YES. Pt had a back and left knee x-ray done s/p a fall    Have you been to an emergency room (ER) or urgent care clinic since your last visit?  YES. Pt went to a [facility] ER.            Have you been hospitalized since your last visit? NO     If yes, where, when, and reason for visit?     Have you seen or consulted any other health care providers outside of the [facility]  since your last visit?  NO     If yes, where, when, and reason for visit?     HM deferred to pcp.

## 2013-11-14 NOTE — Patient Instructions (Signed)
Current health maintenance issues were reviewed and the patient was advised to followup with his/her PCP for completion of these items.

## 2013-11-14 NOTE — Telephone Encounter (Signed)
-----   Message from Barbara CowerBobbie S Mather, LPN sent at 1/61/09606/05/2014  8:22 AM EDT -----  Regarding: Referral  Aaron Reid has an appt with pain management this morning at 1030. The referral on file has expired  Please let me know as soon as you generate the referral so [facility] to pain management

## 2013-11-14 NOTE — Addendum Note (Signed)
Addended by: Donzetta MattersSALSANO, Kenya Shiraishi on: 11/14/2013 08:51 AM     Modules accepted: Orders

## 2013-11-14 NOTE — Progress Notes (Signed)
HISTORY OF PRESENT ILLNESS  Aaron Reid is a 58 y.o. male.  HPI Returns for followup of chronic, severe pain involving the cervical and lumbar spine attributed to underlying spondylosis and degenerative disc disease as well as post cervical and post lumbar laminectomy syndrome. He also suffers from generalized osteoarthritis with particularly involving the left knee.  Since last seen, he has graduated from the masters program at [employer] in [employer] and is looking for work  Pain has been under excellent control with the use of Suboxone which he takes once daily.  Pain level averaging 6-710 with 70% overall relief.  He notes that he had run out of his Suboxone as he had not seen for a few months and during that time he did take some old morphine sulfate suspension.  Pain level today 8/10, outcome 14/28,(The lower the upper number, the better the outcome)  Physical activities mobility remained significantly restricted, sleep is fair, mood is good.  No reported side effects.    A review of the [place] prescription monitoring program does not identify any inconsistency    UDS obtained and reviewed; formal confirmation from laboratory is pending.      Review of Systems   Constitutional: Positive for malaise/fatigue.   Gastrointestinal: Positive for heartburn, nausea and constipation.   Musculoskeletal: Positive for myalgias, back pain, joint pain (polyarticular) and neck pain.   Neurological: Positive for weakness (generalized).   Psychiatric/Behavioral: The patient has insomnia.    All other systems reviewed and are negative.      Physical Exam   Constitutional: He is oriented to person, place, and time. He appears distressed.   HENT:   Head: Normocephalic and atraumatic.   Right Ear: External ear normal.   Left Ear: External ear normal.   Nose: Nose normal.   Mouth/Throat: Oropharynx is clear and moist. No oropharyngeal exudate.   Eyes: Conjunctivae and EOM are normal. Pupils are equal, round,  and reactive to light. Right eye exhibits no discharge. Left eye exhibits no discharge. No scleral icterus.   Neck: Spinous process tenderness and muscular tenderness (spasms) present. Decreased range of motion present. No thyromegaly present.   Cardiovascular: Normal rate, regular rhythm and normal heart sounds.    Pulmonary/Chest: Effort normal and breath sounds normal. No respiratory distress. He has no wheezes. He has no rales.   Abdominal: Soft. He exhibits no distension. There is no tenderness. There is no rebound and no guarding.   Musculoskeletal: He exhibits tenderness.        Right shoulder: He exhibits decreased range of motion, tenderness and pain.        Left shoulder: He exhibits decreased range of motion, tenderness and pain.        Right elbow: He exhibits decreased range of motion. Tenderness (diffuse) found.        Left elbow: He exhibits decreased range of motion. Tenderness (crepitus) found.        Right wrist: He exhibits decreased range of motion, tenderness, bony tenderness and crepitus.        Left wrist: He exhibits decreased range of motion, tenderness, bony tenderness and crepitus.        Right knee: He exhibits decreased range of motion (crepitus) and bony tenderness.        Left knee: He exhibits decreased range of motion (crepitus) and bony tenderness.        Right ankle: He exhibits decreased range of motion. Tenderness.  Left ankle: He exhibits decreased range of motion. Tenderness.        Lumbar back: He exhibits decreased range of motion, tenderness, pain and spasm.   Neurological: He is alert and oriented to person, place, and time. He has normal reflexes. No cranial nerve deficit or sensory deficit. He exhibits normal muscle tone. Gait abnormal. Coordination normal.   Reflex Scores:       Tricep reflexes are 2+ on the right side and 2+ on the left side.       Bicep reflexes are 2+ on the right side and 2+ on the left side.       Brachioradialis reflexes are 2+ on the right  side and 2+ on the left side.       Patellar reflexes are 2+ on the right side and 2+ on the left side.       Achilles reflexes are 2+ on the right side and 2+ on the left side.  Skin: Skin is warm. No rash noted.   Psychiatric: He has a normal mood and affect. His behavior is normal. Judgment and thought content normal.   Nursing note and vitals reviewed.      ASSESSMENT and PLAN  Encounter Diagnoses   Name Primary?   ??? Encounter for long-term (current) use of other medications Yes   ??? Chronic pain syndrome    ??? Neurogenic bladder    ??? Chronic hepatitis C without hepatic coma (HCC)    ??? Uncomplicated opioid dependence (HCC)    ??? Midline low back pain without sciatica    ??? DDD (degenerative disc disease), lumbar    ??? Spasmodic torticollis      He will continue on Suboxone, 8/2, once daily as chronic pain treatment.  3 month reassessment.    No concerns are raised for misuse, abuse, or diversion.    1. Pain medications are prescribed with the objective of pain relief and improved physical and psychosocial function in this patient.  2. Counseled patient on proper use of prescribed medications and reviewed opioid contract.  3. Counseled patient about chronic medical conditions and their relationship to anxiety and depression and recommended mental health support as needed.  4. Reviewed with patient self-help tools, home exercise, and lifestyle changes to assist the patient in self-management of symptoms.  5. Advised patient to have a primary care provider to continue care for health maintenance and general medical conditions and support for referral to specialty care as needed.  6. Reviewed with patient the treatment plan, goals of treatment plan, and limitations of treatment plan, to include the potential for side effects from medications and procedures. If side effects occur, it is the responsibility of the patient to inform the clinic so that a change in the treatment plan can be made in a safe manner. The patient  is advised that stopping prescribed medication may cause an increase in symptoms and possible medication withdrawal symptoms. The patient is informed an emergency room evaluation may be necessary if this occurs.  DISPOSITION: The patient???s condition and plan were discussed at length and all questions were answered. The patient agrees with the plan.    Counseling occupied > 50% of visit:  Total time:  40 minutes

## 2013-11-17 NOTE — Telephone Encounter (Signed)
Suboxone was approved by [facility] 05/19/[facility]. Faxed to pharmacy.

## 2013-11-24 NOTE — Telephone Encounter (Signed)
Pt came into office c/o suboxone not being effective. He states that he was in bed all weekend with the pain causing nausea it got so bad. Pt was taken off the Morphine soln and put back on suboxone. He is requesting consult on med at this time. Have advised that his provider would be consulted when he returns to the office tomorrow and his call returned.

## 2013-11-25 NOTE — Telephone Encounter (Signed)
Per DH:"He needs to allow med to build. No changes at this point. (Routing comment)"  Called pt and LM of info. Advised to call back in 2 weeks if no changes in pain management.

## 2014-02-06 LAB — AMB POC DRUG SCREEN (G0434)
ALCOHOL UR POC: NEGATIVE
AMPHETAMINES UR POC: NEGATIVE
BARBITURATES UR POC: NEGATIVE
BENZODIAZEPINES UR POC: NEGATIVE
BUPRENORPHINE UR POC: NEGATIVE
CANNABINOIDS UR POC: NEGATIVE
CARISOPRODOL UR POC: NEGATIVE
COCAINE UR POC: NEGATIVE
FENTANYL UR POC: NEGATIVE
MDMA/ECSTASY UR POC: NEGATIVE
METHADONE UR POC: NEGATIVE
METHAMPHETAMINE UR POC: NEGATIVE
METHYLPHENIDATE UR POC: NEGATIVE
OPIATES UR POC: NEGATIVE
OXYCODONE UR POC: NEGATIVE
PHENCYCLIDINE UR POC: NEGATIVE
PROPOXYPHENE UR POC: NEGATIVE
TRAMADOL UR POC: NEGATIVE
TRICYCLICS UR POC: NEGATIVE

## 2014-02-06 MED ORDER — BUPRENORPHINE-NALOXONE 8 MG-2 MG SUBLINGUAL FILM
8-2 mg | ORAL_FILM | Freq: Two times a day (BID) | SUBLINGUAL | Status: DC
Start: 2014-02-06 — End: 2014-05-06

## 2014-02-06 NOTE — Patient Instructions (Addendum)
Colace/surfak  Fiber  Miralax  sennakot  Dulcolax suppository    Prescriptions:  Lactulose,amitiza,linzess,movantik,relistor    Current health maintenance issues were reviewed and the patient was advised to followup with his/her PCP for completion of these items.

## 2014-02-06 NOTE — Progress Notes (Signed)
Nursing Notes    Patient presents to the office today in follow-up.   Patient rates his pain at 7/10 on the numerical pain scale.     Reviewed medications with counts as follows:    Rx Date filled Qty Dispensed Pill Count Last Dose Short   suboxone /2mg  SL film 01/19/14 30 12  02/05/14 no                                          Comments:     POC UDS was performed in office today    Any new labs or imaging since last appointment? NO    Have you been to an emergency room (ER) or urgent care clinic since your last visit?  NO            Have you been hospitalized since your last visit? NO     If yes, where, when, and reason for visit?     Have you seen or consulted any other health care providers outside of the [facility]  since your last visit?  NO     If yes, where, when, and reason for visit?

## 2014-02-06 NOTE — Addendum Note (Signed)
Addended by: Bethann Berkshire on: 02/06/2014 04:55 PM      Modules accepted: Orders

## 2014-02-06 NOTE — Progress Notes (Signed)
HISTORY OF PRESENT ILLNESS  Aaron Reid is a 58 y.o. male.  HPI  He returns for followup of chronic, severe pain involving the lumbar spine attributed to a post lumbar laminectomy pain syndrome as well as pain involving the left knee attributed to generalized osteoarthritis.    Pain is coming under better control. Averaging 7/10 with 70% relief.  He is finding that the Suboxone is not lasting a full 24 hours, noting that he at one time was on 3 doses per day and is currently on once a day.  This will be increased to twice a day which should prove adequate.    Pain level today is 7/10, outcome 11/28,(The lower the upper number, the better the outcome)  Physical activity and mobility, mood, and sleep are all good. 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  Mild constipation reported. The opioid-induced constipation protocol is reviewed and suggestions implemented.    A review of the [place] prescription monitoring program does not identify any inconsistency.    UDS obtained and reviewed; formal confirmation from laboratory is pending.      Review of Systems   Constitutional: Positive for malaise/fatigue.   Gastrointestinal: Positive for heartburn, nausea and constipation.   Musculoskeletal: Positive for myalgias, back pain, joint pain (polyarticular) and neck pain.   Neurological: Positive for weakness (generalized).   Psychiatric/Behavioral: The patient has insomnia.    All other systems reviewed and are negative.      Physical Exam   Constitutional: He is oriented to person, place, and time. He appears distressed.   HENT:   Head: Normocephalic and atraumatic.   Right Ear: External ear normal.   Left Ear: External ear normal.   Nose: Nose normal.   Mouth/Throat: Oropharynx is clear and moist. No oropharyngeal exudate.   Eyes: Conjunctivae and EOM are normal. Pupils are equal, round, and reactive to light. Right eye exhibits no discharge. Left eye exhibits no  discharge. No scleral icterus.   Neck: Spinous process tenderness and muscular tenderness (spasms) present. Decreased range of motion present. No thyromegaly present.   Cardiovascular: Normal rate, regular rhythm and normal heart sounds.    Pulmonary/Chest: Effort normal and breath sounds normal. No respiratory distress. He has no wheezes. He has no rales.   Abdominal: Soft. He exhibits no distension. There is no tenderness. There is no rebound and no guarding.   Musculoskeletal: He exhibits tenderness.        Right shoulder: He exhibits decreased range of motion, tenderness and pain.        Left shoulder: He exhibits decreased range of motion, tenderness and pain.        Right elbow: He exhibits decreased range of motion. Tenderness (diffuse) found.        Left elbow: He exhibits decreased range of motion. Tenderness (crepitus) found.        Right wrist: He exhibits decreased range of motion, tenderness, bony tenderness and crepitus.        Left wrist: He exhibits decreased range of motion, tenderness, bony tenderness and crepitus.        Right knee: He exhibits decreased range of motion (crepitus) and bony tenderness.        Left knee: He exhibits decreased range of motion (crepitus) and bony tenderness.        Right ankle: He exhibits decreased range of motion. Tenderness.        Left ankle: He exhibits decreased range  of motion. Tenderness.        Lumbar back: He exhibits decreased range of motion, tenderness, pain and spasm.   Neurological: He is alert and oriented to person, place, and time. He has normal reflexes. No cranial nerve deficit or sensory deficit. He exhibits normal muscle tone. Gait abnormal. Coordination normal.   Reflex Scores:       Tricep reflexes are 2+ on the right side and 2+ on the left side.       Bicep reflexes are 2+ on the right side and 2+ on the left side.       Brachioradialis reflexes are 2+ on the right side and 2+ on the left side.        Patellar reflexes are 2+ on the right side and 2+ on the left side.       Achilles reflexes are 2+ on the right side and 2+ on the left side.  Skin: Skin is warm. No rash noted.   Psychiatric: He has a normal mood and affect. His behavior is normal. Judgment and thought content normal.   Nursing note and vitals reviewed.      ASSESSMENT and PLAN  Encounter Diagnoses   Name Primary?   ??? Chronic pain syndrome Yes   ??? Neurogenic bladder    ??? Chronic hepatitis C without hepatic coma (HCC)    ??? Uncomplicated opioid dependence (HCC)    ??? Midline low back pain without sciatica    ??? DDD (degenerative disc disease), lumbar    ??? S/P lumbar spinal fusion    ??? Spondylosis of lumbar region without myelopathy or radiculopathy    ??? Neck pain    ??? DDD (degenerative disc disease), cervical    ??? S/P cervical spinal fusion    ??? Spondylolisthesis of cervical region    ??? Cervical spondylosis    ??? Foraminal stenosis of cervical region    ??? Primary osteoarthritis involving multiple joints    ??? Musculoskeletal pain    ??? Spasmodic torticollis    ??? GAD (generalized anxiety disorder)    ??? Gastroesophageal reflux disease without esophagitis    ??? Insomnia      Suboxone will be increased as noted above.  Two-month reassessment    No concerns are raised for misuse, abuse, or diversion.    1. Pain medications are prescribed with the objective of pain relief and improved physical and psychosocial function in this patient.  2. Counseled patient on proper use of prescribed medications and reviewed opioid contract.  3. Counseled patient about chronic medical conditions and their relationship to anxiety and depression and recommended mental health support as needed.  4. Reviewed with patient self-help tools, home exercise, and lifestyle changes to assist the patient in self-management of symptoms.  5. Advised patient to have a primary care provider to continue care for health maintenance and general medical conditions and support for referral  to specialty care as needed.  6. Reviewed with patient the treatment plan, goals of treatment plan, and limitations of treatment plan, to include the potential for side effects from medications and procedures. If side effects occur, it is the responsibility of the patient to inform the clinic so that a change in the treatment plan can be made in a safe manner. The patient is advised that stopping prescribed medication may cause an increase in symptoms and possible medication withdrawal symptoms. The patient is informed an emergency room evaluation may be necessary if this occurs.  DISPOSITION: The patient???s condition  and plan were discussed at length and all questions were answered. The patient agrees with the plan.    Counseling occupied > 50% of visit:  Total time: 40 minutes

## 2014-02-12 MED ORDER — TRIMETHOPRIM-SULFAMETHOXAZOLE 160 MG-800 MG TAB
160-800 mg | ORAL_TABLET | Freq: Two times a day (BID) | ORAL | Status: AC
Start: 2014-02-12 — End: 2014-02-22

## 2014-02-12 MED ORDER — HYDROCHLOROTHIAZIDE 12.5 MG TAB
12.5 mg | ORAL_TABLET | Freq: Every day | ORAL | Status: DC
Start: 2014-02-12 — End: 2014-06-24

## 2014-02-12 NOTE — Progress Notes (Signed)
Aaron Reid is a 57 y.o. male presents c/o right facial swelling. Patient stated he first noticed it this morning.    1. Have you been to the ER, urgent care clinic since your last visit?  Hospitalized since your last visit?No    2. Have you seen or consulted any other health care providers outside of the [facility] since your last visit?  Include any pap smears or colon screening. No

## 2014-02-12 NOTE — Patient Instructions (Addendum)
Complete prescribed course of antibiotics. Apply warm wet compresses frequently. Return for follow up in 5 days as scheduled, sooner with any problems.  Begin HCTZ 10 mg daily.   Take medication as prescribed and report any suspected adverse effects. Avoid dietary sodium and work on weight control by avoiding starch and sugar. Maintain a regular program of aerobic exercise. Home or office blood pressure checks at least once a week. Bring results to next appointment. Report readings consistently >140/90.        Epidermoid Cyst: After Your Visit  Your Care Instructions  An epidermoid (say "eh-pih-DER-moyd") cyst is a lump just under the skin. These cysts can form when a hair follicle becomes blocked. They are common in acne and may occur on the face, neck, back, and genitals. However, they can form anywhere on the body. These cysts are not cancer and do not lead to cancer. They tend not to hurt, but they can sometimes become swollen and painful. They also may break open (rupture) and cause scarring.  These cysts sometimes do not cause problems and may not need treatment. If you have a cyst that is swollen and hurts, your doctor may inject it with a medicine to help it heal. But it is more likely that a painful cyst will need to be removed. Your doctor will give you a shot of numbing medicine and cut into the cyst to drain it or remove it. This makes the symptoms go away.  Follow-up care is a key part of your treatment and safety. Be sure to make and go to all appointments, and call your doctor if you are having problems. It???s also a good idea to know your test results and keep a list of the medicines you take.  How can you care for yourself at home?  ?? Do not squeeze the cyst or poke it with a needle to open it. This can cause swelling, redness, and infection.  ?? Always have a doctor look at any new lumps you get to make sure that they are not serious.  When should you call for help?   Watch closely for changes in your health, and be sure to contact your doctor if:  ?? You have a fever, redness, or swelling after you get a shot of medicine in the cyst.  ?? You see or feel a new lump on your skin.   Where can you learn more?   Go to [employer]  Enter [phone] in the search box to learn more about "Epidermoid Cyst: After Your Visit."   ?? 2006-2015 Healthwise[employer]. Care instructions adapted under license by [employer] (which disclaims liability or warranty for this information). This care instruction is for use with your licensed healthcare professional. If you have questions about a medical condition or this instruction, always ask your healthcare professional. Healthwise, Incorporated disclaims any warranty or liability for your use of this information.  Content Version: 10.5.422740; Current as of: July 25, 2013              High Blood Pressure: After Your Visit  Your Care Instructions  If your blood pressure is usually above 140/90, you have high blood pressure, or hypertension. Despite what a lot of people think, high blood pressure usually doesn't cause headaches or make you feel dizzy or lightheaded. It usually has no symptoms. But it does increase your risk for heart attack, stroke, and kidney or eye damage. The higher your blood pressure, the more your risk increases.  Your doctor will give you a goal for your blood pressure. Your goal will be based on your health and your age. An example of a goal is to keep your blood pressure below 140/90.  Lifestyle changes, such as eating healthy and being active, are always important to help lower blood pressure. You might also take medicine to reach your blood pressure goal.  Follow-up care is a key part of your treatment and safety. Be sure to make and go to all appointments, and call your doctor if you are having problems. It's also a good idea to know your test results and keep a list of the medicines you take.   How can you care for yourself at home?  Medical treatment  ?? If you stop taking your medicine, your blood pressure will go back up. You may take one or more types of medicine to lower your blood pressure. Be safe with medicines. Take your medicine exactly as prescribed. Call your doctor if you think you are having a problem with your medicine.  ?? Your doctor may suggest that you take one low-dose aspirin (81 mg) a day. This can help reduce your risk of having a stroke or heart attack.  ?? See your doctor regularly. You may need to see the doctor more often at first or until your blood pressure comes down.  ?? If you are taking blood pressure medicine, talk to your doctor before you take decongestants or anti-inflammatory medicine, such as ibuprofen. Some of these medicines can raise blood pressure.  ?? Learn how to check your blood pressure at home.  Lifestyle changes  ?? Stay at a healthy weight. This is especially important if you put on weight around the waist. Losing even 10 pounds can help you lower your blood pressure.  ?? If your doctor recommends it, get more exercise. Walking is a good choice. Bit by bit, increase the amount you walk every day. Try for at least 30 minutes on most days of the week. You also may want to swim, bike, or do other activities.  ?? Avoid or limit alcohol. Talk to your doctor about whether you can drink any alcohol.  ?? Try to limit how much sodium you eat to less than 2,300 milligrams (mg) a day. Your doctor may ask you to try to eat less than 1,500 mg a day.  ?? Eat plenty of fruits (such as bananas and oranges), vegetables, legumes, whole grains, and low-fat dairy products.  ?? Lower the amount of saturated fat in your diet. Saturated fat is found in animal products such as milk, cheese, and meat. Limiting these foods may help you lose weight and also lower your risk for heart disease.  ?? Do not smoke. Smoking increases your risk for heart attack and stroke.  If you need help quitting, talk to your doctor about stop-smoking programs and medicines. These can increase your chances of quitting for good.  When should you call for help?  Call your doctor now or seek immediate medical care if:  ?? Your blood pressure is much higher than normal (such as 180/110 or higher).  ?? You think high blood pressure is causing symptoms such as:  ?? Severe headache.  ?? Blurry vision.  Watch closely for changes in your health, and be sure to contact your doctor if:  ?? You do not get better as expected.   Where can you learn more?   Go to [employer]  Enter [id number] in the search box to  learn more about "High Blood Pressure: After Your Visit."   ?? 2006-2015 Healthwise, Inc[employer]. Care instructions adapted under license by [employer] (which disclaims liability or warranty for this information). This care instruction is for use with your licensed healthcare professional. If you have questions about a medical condition or this instruction, always ask your healthcare professional. [employer] any warranty or liability for your use of this information.  Content Version: 10.5.422740; Current as of: July 25, 2013

## 2014-02-12 NOTE — Progress Notes (Signed)
HISTORY OF PRESENT ILLNESS  Aaron Reid is a 58 y.o. male.  HPI Comments: Aaron Reid presents with right facial symptoms and findings as described below as well as an elevated blood pressure. Chart review reveals BPs consistently elevated since 11/2013. Patient acknowledges having been cautioned about elevated BP and given life style recommendations previously. He has a family history of hypertension in siblings.    Facial Swelling  The history is provided by the patient and medical records. Chronicity: long standing with recent progression. Episode onset: has noted an indentation for years after "popping a pimple", and a "knot" x about 6 months, swelling  first noticed this morning. Pertinent negatives include no chest pain, no headaches and no shortness of breath.       Review of Systems   Constitutional: Negative for fever and chills.   HENT: Positive for facial swelling.         No pain with mastication   Respiratory: Negative for shortness of breath.    Cardiovascular: Negative for chest pain and palpitations.   Musculoskeletal: Positive for back pain (chronic).   Skin:        Sore right face   Neurological: Negative for headaches.     BP 156/104 mmHg   Pulse 75   Temp(Src) 98.5 ??F (36.9 ??C) (Oral)   Resp 14   Ht  (1.778 m)   Wt 237 lb (107.502 kg)   BMI 34.01 kg/m2   SpO2 98%  Physical Exam   Constitutional: He is oriented to person, place, and time. He appears well-developed and well-nourished.   HENT:   Head: Normocephalic.   Right Ear: Tympanic membrane and ear canal normal.   Left Ear: Tympanic membrane and ear canal normal.   Mouth/Throat: Oropharynx is clear and moist.   Eyes: Conjunctivae and EOM are normal.   Neck: Neck supple.   Cardiovascular: Normal rate, regular rhythm and normal heart sounds.    Elevated BP   Pulmonary/Chest: Effort normal and breath sounds normal.   Musculoskeletal: He exhibits no edema.   Lymphadenopathy:     He has no cervical adenopathy.    Neurological: He is alert and oriented to person, place, and time.   Skin: Skin is warm and dry.   Right malar area with an area of thickening without fluctuance, mildly tender, adjacent indentation   Psychiatric: He has a normal mood and affect. His behavior is normal.   Nursing note and vitals reviewed.      ASSESSMENT and PLAN    ICD-9-CM ICD-10-CM    1. Infected sebaceous cyst of skin 706.2 L72.3 trimethoprim-sulfamethoxazole (BACTRIM DS, SEPTRA DS) 160-800 mg per tablet   Complete prescribed course of antibiotics. Apply warm wet compresses frequently. Return for follow up in 5 days as scheduled, sooner with any problems.  Begin lisinopril-HCTZ 10-12.5 mg daily.   Take medication as prescribed and report any suspected adverse effects. Avoid dietary sodium and work on weight control by avoiding starch and sugar. Maintain a regular program of aerobic exercise. Home or office blood pressure checks at least once a week. Bring results to next appointment. Report readings consistently >140/90.

## 2014-02-16 ENCOUNTER — Encounter: Payer: Self-pay | Admitting: Internal Medicine

## 2014-02-17 MED ORDER — AMLODIPINE 5 MG TAB
5 mg | ORAL_TABLET | Freq: Every day | ORAL | Status: DC
Start: 2014-02-17 — End: 2014-06-24

## 2014-02-17 MED ORDER — CEFDINIR 300 MG CAP
300 mg | ORAL_CAPSULE | Freq: Two times a day (BID) | ORAL | Status: AC
Start: 2014-02-17 — End: 2014-02-27

## 2014-02-17 NOTE — Progress Notes (Signed)
HISTORY OF PRESENT ILLNESS  Aaron Reid is a 58 y.o. male.  HPI  Facial swelling, abscess? Er visit for I&D  hbp on hctz, c/o dyspnea ? Septra for abscess  Review of Systems   All other systems reviewed and are negative.    Past Medical History   Diagnosis Date   ??? Cervical fusion syndrome 10/2003   ??? Chronic pain 1980     lower back    ??? LBP (low back pain) 12/24/2012   ??? DDD (degenerative disc disease), lumbar 12/24/2012   ??? S/P lumbar spinal fusion 12/24/2012   ??? Lumbar spondylosis 12/24/2012   ??? Neck pain 12/24/2012   ??? DDD (degenerative disc disease), cervical 12/24/2012   ??? S/P cervical spinal fusion 12/24/2012   ??? Spondylolisthesis of cervical region 12/24/2012   ??? Cervical spondylosis 12/24/2012   ??? Foraminal stenosis of cervical region 12/24/2012   ??? DJD (degenerative joint disease), multiple sites 12/24/2012   ??? Musculoskeletal pain 12/24/2012   ??? Spasmodic torticollis 12/24/2012   ??? GAD (generalized anxiety disorder) 12/24/2012   ??? Hepatitis C 12/24/2012   ??? Insomnia 12/24/2012   ??? Opioid dependence (HCC) 02/21/2013   ??? Cervical fusion syndrome 2005   ??? Chronic pain 1980     lower back   ??? Neurogenic bladder    ??? GERD (gastroesophageal reflux disease)    ??? Hepatitis C infection    ??? Back injury    ??? Neck injury    ??? Head injury    ??? Arthritis      Current Outpatient Prescriptions on File Prior to Visit   Medication Sig Dispense Refill   ??? trimethoprim-sulfamethoxazole (BACTRIM DS, SEPTRA DS) 160-800 mg per tablet Take 1 Tab by mouth two (2) times a day for 10 days. 20 Tab 0   ??? Hydrochlorothiazide (HYDRODIURIL) 12.5 mg tablet Take 1 Tab by mouth daily. Indications: HYPERTENSION 30 Tab 1   ??? buprenorphine-naloxone (SUBOXONE) 8-2 mg subl by SubLINGual route daily.     ??? ranitidine (ZANTAC) 300 mg tablet Take 300 mg by mouth daily.     ??? buprenorphine-naloxone (SUBOXONE) 8-2 mg film sublingaul film 1 Film by SubLINGual route two (2) times a day for 30 days. Max Daily Amount: 2  Film. Indications: CHRONIC PAIN, OPIOID DEPENDENCE 60 Film 2   ??? fluticasone (FLONASE) 50 mcg/actuation nasal spray 1 spray in each nostril every am 1 Bottle 2   ??? multivitamin (ONE A DAY) tablet Take 1 tablet by mouth daily.     ??? tadalafil (CIALIS,ADCIRCA) 20 mg tablet Take 1 Tab by mouth as needed. 30 Tab 0   ??? aspirin delayed-release 81 mg tablet Take  by mouth daily.     ??? guaiFENesin (MUCINEX) 1,200 mg Ta12 ER tablet Take 1 Tab by mouth two (2) times a day. 20 Tab 0   ??? pseudoephedrine (SUDAFED) 30 mg tablet Take 1 tablet by mouth every six (6) hours as needed for Congestion. 30 tablet 0   ??? meclizine (ANTIVERT) 25 mg tablet Take 1 tablet by mouth three (3) times daily as needed for Dizziness. 60 tablet 3   ??? solifenacin (VESICARE) 10 mg tablet Take 10 mg by mouth daily.     ??? pantoprazole (PROTONIX) 20 mg tablet Take 1 Tab by mouth two (2) times a day. 180 Tab 3   ??? LORazepam (ATIVAN) 0.5 mg tablet Take 1 Tab by mouth every eight (8) hours as needed for Anxiety. 30 Tab 0   ??? calcium  carbonate (ANTACID EXTRA-STRENGTH) 200 mg calcium (500 mg) Chew Take 1 Tab by mouth daily.       No current facility-administered medications on file prior to visit.     BP 149/85 mmHg   Pulse 82   Temp(Src) 97.8 ??F (36.6 ??C) (Oral)   Resp 22   Ht  (1.778 m)   Wt 233 lb (105.688 kg)   BMI 33.43 kg/m2   SpO2 98%      Physical Exam   Constitutional: He appears well-developed and well-nourished. No distress.   HENT:   Head: Normocephalic and atraumatic.   Right Ear: External ear normal.   Left Ear: External ear normal.   Mouth/Throat: Oropharynx is clear and moist. No oropharyngeal exudate.   Fullness and tissue thickening right malar area  + tenderness  No flocctuance    Neck: Normal range of motion. Neck supple. No thyromegaly present.   Lymphadenopathy:     He has no cervical adenopathy.   Skin: He is not diaphoretic.   Vitals reviewed.    Reviewed er records  ASSESSMENT and PLAN   facial abscess, unclear if communicating with sinuses  Hbp, ? Reaction to hctz  Plan  Switch hctz to amlodipine 5 mg  Switch sulfa to cefdinir  Ct facial

## 2014-02-17 NOTE — Progress Notes (Signed)
Aaron Reid is a 58 y.o. male here today to follow up on RT side facial swelling and hypertension.      1. Have you been to the ER, urgent care clinic since your last visit?  Hospitalized since your last visit?Yes Where: [facility]    2. Have you seen or consulted any other health care providers outside of the [facility] since your last visit?  Include any pap smears or colon screening. No

## 2014-02-18 LAB — HEMOGLOBIN A1C WITH EAG: Hemoglobin A1c: 6 % — ABNORMAL HIGH (ref 4.8–5.6)

## 2014-02-18 NOTE — Progress Notes (Signed)
Quick Note:        Cal, is borderline for diabetes, need sto watch his diet and lose wgt.    ______

## 2014-02-18 NOTE — Progress Notes (Signed)
Quick Note:        Spoke with patient and gave resulted note.    ______

## 2014-03-05 NOTE — Telephone Encounter (Signed)
Call patient let him know his CT is [facility] on Oct [facility] at 8am.

## 2014-03-05 NOTE — Telephone Encounter (Signed)
Call placed to patient, no answer, mailbox is full unable to LAM.

## 2014-03-06 NOTE — Telephone Encounter (Signed)
Call placed to patient, no answer, unable to leave a msg due to mail box is full.   Second attempt.

## 2014-03-09 NOTE — Telephone Encounter (Signed)
Call placed to patient, no answer, mail box is full unable to leave a msg.

## 2014-03-11 NOTE — Telephone Encounter (Signed)
Send office notes to [facility] at [phone]

## 2014-03-12 NOTE — Telephone Encounter (Signed)
The imaging you ordered for the carotid is wrong you need to order a CT of Soft Tissue Neck   fax to: [phone]

## 2014-03-12 NOTE — Telephone Encounter (Signed)
Last two office notes faxed over.   Fax confirmatio received.

## 2014-03-13 NOTE — Telephone Encounter (Signed)
The area of concern is nowhere near his neck! I need to speak with the radiologist.

## 2014-03-31 ENCOUNTER — Ambulatory Visit: Admit: 2014-03-31 | Discharge: 2014-03-31 | Payer: MEDICARE | Attending: Internal Medicine | Primary: Internal Medicine

## 2014-03-31 DIAGNOSIS — K118 Other diseases of salivary glands: Secondary | ICD-10-CM

## 2014-03-31 MED ORDER — TADALAFIL 5 MG TABLET
5 mg | ORAL_TABLET | ORAL | Status: DC | PRN
Start: 2014-03-31 — End: 2014-10-05

## 2014-03-31 NOTE — Progress Notes (Signed)
Aaron Reid is a 58 y.o. male c/o swollen glands x three days.      1. Have you been to the ER, urgent care clinic since your last visit?  Hospitalized since your last visit?No    2. Have you seen or consulted any other health care providers outside of the [facility] since your last visit?  Include any pap smears or colon screening. No

## 2014-03-31 NOTE — Progress Notes (Signed)
HISTORY OF PRESENT ILLNESS  Aaron Reid is a 58 y.o. male.  HPI  Neck swelling , some dysphagia  Review of Systems   All other systems reviewed and are negative.    Past Medical History   Diagnosis Date   ??? Cervical fusion syndrome 10/2003   ??? Chronic pain 1980     lower back    ??? LBP (low back pain) 12/24/2012   ??? DDD (degenerative disc disease), lumbar 12/24/2012   ??? S/P lumbar spinal fusion 12/24/2012   ??? Lumbar spondylosis 12/24/2012   ??? Neck pain 12/24/2012   ??? DDD (degenerative disc disease), cervical 12/24/2012   ??? S/P cervical spinal fusion 12/24/2012   ??? Spondylolisthesis of cervical region 12/24/2012   ??? Cervical spondylosis 12/24/2012   ??? Foraminal stenosis of cervical region 12/24/2012   ??? DJD (degenerative joint disease), multiple sites 12/24/2012   ??? Musculoskeletal pain 12/24/2012   ??? Spasmodic torticollis 12/24/2012   ??? GAD (generalized anxiety disorder) 12/24/2012   ??? Hepatitis C 12/24/2012   ??? Insomnia 12/24/2012   ??? Opioid dependence (HCC) 02/21/2013   ??? Cervical fusion syndrome 2005   ??? Chronic pain 1980     lower back   ??? Neurogenic bladder    ??? GERD (gastroesophageal reflux disease)    ??? Hepatitis C infection    ??? Back injury    ??? Neck injury    ??? Head injury    ??? Arthritis      Current Outpatient Prescriptions on File Prior to Visit   Medication Sig Dispense Refill   ??? amLODIPine (NORVASC) 5 mg tablet Take 1 Tab by mouth daily. 30 Tab 0   ??? ranitidine (ZANTAC) 300 mg tablet Take 300 mg by mouth daily.     ??? guaiFENesin (MUCINEX) 1,200 mg Ta12 ER tablet Take 1 Tab by mouth two (2) times a day. 20 Tab 0   ??? fluticasone (FLONASE) 50 mcg/actuation nasal spray 1 spray in each nostril every am 1 Bottle 2   ??? multivitamin (ONE A DAY) tablet Take 1 tablet by mouth daily.     ??? tadalafil (CIALIS,ADCIRCA) 20 mg tablet Take 1 Tab by mouth as needed. 30 Tab 0   ??? LORazepam (ATIVAN) 0.5 mg tablet Take 1 Tab by mouth every eight (8) hours as needed for Anxiety. 30 Tab 0    ??? aspirin delayed-release 81 mg tablet Take  by mouth daily.     ??? calcium carbonate (ANTACID EXTRA-STRENGTH) 200 mg calcium (500 mg) Chew Take 1 Tab by mouth daily.     ??? Hydrochlorothiazide (HYDRODIURIL) 12.5 mg tablet Take 1 Tab by mouth daily. Indications: HYPERTENSION 30 Tab 1   ??? buprenorphine-naloxone (SUBOXONE) 8-2 mg subl by SubLINGual route daily.     ??? pseudoephedrine (SUDAFED) 30 mg tablet Take 1 tablet by mouth every six (6) hours as needed for Congestion. 30 tablet 0   ??? meclizine (ANTIVERT) 25 mg tablet Take 1 tablet by mouth three (3) times daily as needed for Dizziness. 60 tablet 3   ??? solifenacin (VESICARE) 10 mg tablet Take 10 mg by mouth daily.     ??? pantoprazole (PROTONIX) 20 mg tablet Take 1 Tab by mouth two (2) times a day. 180 Tab 3     No current facility-administered medications on file prior to visit.     BP 141/83 mmHg   Pulse 76   Temp(Src) 97.5 ??F (36.4 ??C) (Oral)   Resp 20   Ht 5\' 10"  (1.778  m)   Wt 233 lb (105.688 kg)   BMI 33.43 kg/m2   SpO2 96%      Physical Exam   Constitutional: He appears well-developed and well-nourished. No distress.   Neck: Normal range of motion. Neck supple. No thyromegaly present.   Right sub-mandibular mass   Lymphadenopathy:     He has no cervical adenopathy.   Skin: He is not diaphoretic.   Vitals reviewed.      ASSESSMENT and PLAN  sub mandibular mass   Plan  Reschedule ct scan

## 2014-03-31 NOTE — Addendum Note (Signed)
Addended by: Donzetta MattersSALSANO, Kamiya Acord on: 03/31/2014 04:50 PM      Modules accepted: Orders

## 2014-04-04 NOTE — Progress Notes (Signed)
Call, ct showed changes in his parotid gland. Did he have prior surgery? If he had a previous CT scan, we need a copy of it  Otherwise refer to ENT (dr. Allyson SabalBerry, if on his plan)

## 2014-04-06 NOTE — Addendum Note (Signed)
Addended by: Donzetta MattersSALSANO, Kamerin Grumbine on: 04/06/2014 10:41 AM      Modules accepted: Orders

## 2014-04-06 NOTE — Progress Notes (Signed)
Call placed to patient, he states he hasn't had any surgeries in the parotid area, nor has he ever has any other imaging of the area done. Patient agrees to see Dr. Allyson SabalBerry. Please place referral.

## 2014-04-13 ENCOUNTER — Encounter

## 2014-05-06 ENCOUNTER — Encounter: Attending: Urology | Primary: Internal Medicine

## 2014-05-06 ENCOUNTER — Ambulatory Visit: Admit: 2014-05-06 | Discharge: 2014-05-06 | Payer: MEDICARE | Attending: Pain Medicine | Primary: Internal Medicine

## 2014-05-06 ENCOUNTER — Ambulatory Visit: Attending: Pain Medicine | Primary: Internal Medicine

## 2014-05-06 DIAGNOSIS — M5440 Lumbago with sciatica, unspecified side: Secondary | ICD-10-CM

## 2014-05-06 MED ORDER — BUPRENORPHINE-NALOXONE 8 MG-2 MG SUBLINGUAL FILM
8-2 mg | ORAL_FILM | Freq: Two times a day (BID) | SUBLINGUAL | Status: DC
Start: 2014-05-06 — End: 2014-08-05

## 2014-05-06 NOTE — Progress Notes (Signed)
Nursing Notes    Patient presents to the office today in follow-up.   Patient rates his pain at 4/10 on the numerical pain scale.     Reviewed medications with counts as follows:    Rx Date filled Qty Dispensed Pill Count Last Dose Short                                                Pt did not bring Rx for suboxone; counseling done and PMP shows:04/30/14. Pt states he has 5 films left. Counseled on policy    Comments: pt c/o loss of taste with med, also constipation     POC UDS was not performed in office today    Any new labs or imaging since last appointment? MRI of face , cyst removal    Have you been to an emergency room (ER) or urgent care clinic since your last visit?  NO            Have you been hospitalized since your last visit? NO     If yes, where, when, and reason for visit?     Have you seen or consulted any other health care providers outside of the [facility]  since your last visit?  NO     If yes, where, when, and reason for visit?     Mr. [place] RidingBurgess has a reminder for a "due or due soon" health maintenance. I have asked that he contact his primary care provider for follow-up on this health maintenance.

## 2014-05-06 NOTE — Progress Notes (Signed)
HISTORY OF PRESENT ILLNESS  Aaron Reid is a 58 y.o. male.  HPI  He returns for followup of chronic, severe pain involving the cervical and lumbar spine as well as the left knee. He suffers from post cervical post lumbar laminectomy pain as well as generalized osteoarthritis.    Pain has been under good control, averaging 4-5/10 with 50% overall relief.  Pain level today is 4/10, outcome 8/28,(The lower the upper number, the better the outcome)  , Mood, and sleep are all good. Physical activity is good.  Mild constipation. The opioid-induced constipation protocol was reviewed and suggestions implemented.    Review of Systems   Constitutional: Positive for malaise/fatigue.   Gastrointestinal: Positive for heartburn, nausea and constipation.   Musculoskeletal: Positive for myalgias, back pain, joint pain (polyarticular) and neck pain.   Neurological: Positive for weakness (generalized).   Psychiatric/Behavioral: The patient has insomnia.    All other systems reviewed and are negative.      Physical Exam   Constitutional: He is oriented to person, place, and time. He appears distressed.   HENT:   Head: Normocephalic and atraumatic.   Right Ear: External ear normal.   Left Ear: External ear normal.   Nose: Nose normal.   Mouth/Throat: Oropharynx is clear and moist. No oropharyngeal exudate.   Eyes: Conjunctivae and EOM are normal. Pupils are equal, round, and reactive to light. Right eye exhibits no discharge. Left eye exhibits no discharge. No scleral icterus.   Neck: Spinous process tenderness and muscular tenderness (spasms) present. Decreased range of motion present. No thyromegaly present.   Cardiovascular: Normal rate, regular rhythm and normal heart sounds.    Pulmonary/Chest: Effort normal and breath sounds normal. No respiratory distress. He has no wheezes. He has no rales.   Abdominal: Soft. He exhibits no distension. There is no tenderness. There is no rebound and no guarding.    Musculoskeletal: He exhibits tenderness.        Right shoulder: He exhibits decreased range of motion, tenderness and pain.        Left shoulder: He exhibits decreased range of motion, tenderness and pain.        Right elbow: He exhibits decreased range of motion. Tenderness (diffuse) found.        Left elbow: He exhibits decreased range of motion. Tenderness (crepitus) found.        Right wrist: He exhibits decreased range of motion, tenderness, bony tenderness and crepitus.        Left wrist: He exhibits decreased range of motion, tenderness, bony tenderness and crepitus.        Right knee: He exhibits decreased range of motion (crepitus) and bony tenderness.        Left knee: He exhibits decreased range of motion (crepitus) and bony tenderness.        Right ankle: He exhibits decreased range of motion. Tenderness.        Left ankle: He exhibits decreased range of motion. Tenderness.        Lumbar back: He exhibits decreased range of motion, tenderness, pain and spasm.   Neurological: He is alert and oriented to person, place, and time. He has normal reflexes. No cranial nerve deficit or sensory deficit. He exhibits normal muscle tone. Gait abnormal. Coordination normal.   Reflex Scores:       Tricep reflexes are 2+ on the right side and 2+ on the left side.       Bicep reflexes are 2+ on the  right side and 2+ on the left side.       Brachioradialis reflexes are 2+ on the right side and 2+ on the left side.       Patellar reflexes are 2+ on the right side and 2+ on the left side.       Achilles reflexes are 2+ on the right side and 2+ on the left side.  Skin: Skin is warm. No rash noted.   Psychiatric: He has a normal mood and affect. His behavior is normal. Judgment and thought content normal.   Nursing note and vitals reviewed.      ASSESSMENT and PLAN  Encounter Diagnoses   Name Primary?   ??? Midline low back pain with sciatica, sciatica laterality unspecified Yes   ??? Chronic pain syndrome     ??? DDD (degenerative disc disease), lumbar    ??? Spondylosis of lumbar region without myelopathy or radiculopathy    ??? S/P lumbar spinal fusion    ??? Neck pain    ??? DDD (degenerative disc disease), cervical    ??? S/P cervical spinal fusion    ??? Spondylolisthesis of cervical region    ??? Cervical spondylosis    ??? Primary osteoarthritis involving multiple joints    ??? Encounter for long-term (current) use of high-risk medication      He will continue on his current treatment regimen as this is providing good pain control with improved functionality and minimal side effects.  Three-month reassessment    No concerns are raised for misuse, abuse, or diversion.    1. Pain medications are prescribed with the objective of pain relief and improved physical and psychosocial function in this patient.  2. Counseled patient on proper use of prescribed medications and reviewed opioid contract.  3. Counseled patient about chronic medical conditions and their relationship to anxiety and depression and recommended mental health support as needed.  4. Reviewed with patient self-help tools, home exercise, and lifestyle changes to assist the patient in self-management of symptoms.  5. Advised patient to have a primary care provider to continue care for health maintenance and general medical conditions and support for referral to specialty care as needed.  6. Reviewed with patient the treatment plan, goals of treatment plan, and limitations of treatment plan, to include the potential for side effects from medications and procedures. If side effects occur, it is the responsibility of the patient to inform the clinic so that a change in the treatment plan can be made in a safe manner. The patient is advised that stopping prescribed medication may cause an increase in symptoms and possible medication withdrawal symptoms. The patient is informed an emergency room evaluation may be necessary if this occurs.   DISPOSITION: The patient???s condition and plan were discussed at length and all questions were answered. The patient agrees with the plan.    Counseling occupied > 50% of visit:  Total time: 30 minutes

## 2014-05-06 NOTE — Patient Instructions (Signed)
Current health maintenance issues were reviewed and the patient was advised to followup with his/her PCP for completion of these items.

## 2014-05-19 ENCOUNTER — Encounter: Attending: Urology | Primary: Internal Medicine

## 2014-06-17 ENCOUNTER — Encounter: Attending: Urology | Primary: Internal Medicine

## 2014-06-24 ENCOUNTER — Ambulatory Visit: Admit: 2014-06-24 | Discharge: 2014-06-24 | Payer: MEDICARE | Attending: Family | Primary: Internal Medicine

## 2014-06-24 DIAGNOSIS — J029 Acute pharyngitis, unspecified: Secondary | ICD-10-CM

## 2014-06-24 MED ORDER — AZITHROMYCIN 250 MG TAB
250 mg | ORAL_TABLET | ORAL | Status: DC
Start: 2014-06-24 — End: 2014-10-05

## 2014-06-24 NOTE — Progress Notes (Signed)
Ceasar LundLeon Gregory Corrigan is a [id number] y.o. male here today for cough, sore throat, ear pain, loose stool, and chest pain. X 1 week.    1. Have you been to the ER, urgent care clinic or hospitalized since your last visit? NO.     2. Have you seen or consulted any other health care providers outside of the [facility] since your last visit (Include any pap smears or colon screening)? NO      Do you have an Advanced Directive? NO    Would you like information on Advanced Directives? NO

## 2014-06-24 NOTE — Progress Notes (Signed)
[facility]  Primary Care Office Visit - URI    DOB: 1956/02/15   Aaron Reid is a 59 y.o. male presenting for  Chief Complaint   Patient presents with   ??? Cold Symptoms     X 1 week   ??? Diarrhea       Assessment/Plan:   Zacarias was seen today for cold symptoms and diarrhea.    Diagnoses and all orders for this visit:    Acute pharyngitis, unspecified pharyngitis type  - reassurance, self care instructions given, recommend multi-symptom OTC medications ATC, push fluid and rest  - Tylenol PRN OTC as needed for HA or fever  - Call or return to clinic prn if these symptoms worsen or fail to improve as anticipated.   Orders:  -     azithromycin (ZITHROMAX) 250 mg tablet; Take 2 tablets today, then take 1 tablet daily    Patient is agreeable to the above plan and voiced understanding. All questions answered.     Karlton Lemon  FNP-C  06/24/2014, 10:21 AM    History:   Aaron Reid is a 59 y.o. male presenting to clinic for cold symptoms.     Started with fatigue and muscle aches x 1 week  Now ears are clogged, sore throat, coughing, chills x 2 days   Feeling worse, "rattling in my chest and lungs feel itchy", +productive   Low grade temp today    (-) CP, SOB, N/V/D, fever    Does not smoke    Did not get flu shot this year    Past Medical History   Diagnosis Date   ??? Cervical fusion syndrome 10/2003   ??? Chronic pain 1980     lower back    ??? LBP (low back pain) 12/24/2012   ??? DDD (degenerative disc disease), lumbar 12/24/2012   ??? S/P lumbar spinal fusion 12/24/2012   ??? Lumbar spondylosis 12/24/2012   ??? Neck pain 12/24/2012   ??? DDD (degenerative disc disease), cervical 12/24/2012   ??? S/P cervical spinal fusion 12/24/2012   ??? Spondylolisthesis of cervical region 12/24/2012   ??? Cervical spondylosis 12/24/2012   ??? Foraminal stenosis of cervical region 12/24/2012   ??? DJD (degenerative joint disease), multiple sites 12/24/2012   ??? Musculoskeletal pain 12/24/2012   ??? Spasmodic torticollis 12/24/2012    ??? GAD (generalized anxiety disorder) 12/24/2012   ??? Hepatitis C 12/24/2012   ??? Insomnia 12/24/2012   ??? Opioid dependence (HCC) 02/21/2013   ??? Cervical fusion syndrome 2005   ??? Chronic pain 1980     lower back   ??? Neurogenic bladder    ??? GERD (gastroesophageal reflux disease)    ??? Hepatitis C infection    ??? Back injury    ??? Neck injury    ??? Head injury    ??? Arthritis      Past Surgical History   Procedure Laterality Date   ??? Endoscopy, colon, diagnostic     ??? Hx orthopaedic       spinal fusion, lumbar, cervical   ??? Hx orthopaedic       spinal fusion, lumbar cervical      reports that he has never smoked. He has never used smokeless tobacco. He reports that he uses illicit drugs (Cocaine, Marijuana, and Other). He reports that he does not drink alcohol.  History   Smoking status   ??? Never Smoker    Smokeless tobacco   ??? Never Used  Family History   Problem Relation Age of Onset   ??? Heart Disease Sister    ??? Diabetes Brother    ??? Stroke Brother      Allergies   Allergen Reactions   ??? Latex Rash   ??? Cymbalta [Duloxetine] Anxiety   ??? Duragesic [Fentanyl] Drowsiness     Altered LOC       Problem List:      Patient Active Problem List    Diagnosis   ??? Encounter for long-term (current) use of high-risk medication   ??? Unspecified essential hypertension   ??? Neurogenic bladder   ??? Chronic hepatitis C (HCC)   ??? Opioid dependence (HCC)   ??? LBP (low back pain)   ??? DDD (degenerative disc disease), lumbar   ??? S/P lumbar spinal fusion   ??? Lumbar spondylosis   ??? Neck pain   ??? DDD (degenerative disc disease), cervical   ??? S/P cervical spinal fusion   ??? Spondylolisthesis of cervical region   ??? Cervical spondylosis   ??? Foraminal stenosis of cervical region   ??? DJD (degenerative joint disease), multiple sites   ??? Musculoskeletal pain   ??? Spasmodic torticollis   ??? GAD (generalized anxiety disorder)   ??? Hepatitis C   ??? GERD (gastroesophageal reflux disease)   ??? Insomnia   ??? Neurogenic bladder       Medications:      Current Outpatient Prescriptions   Medication Sig   ??? DEXTROMETHORPHAN HBR/CHLOR-MAL (ROBITUSSIN COUGH & COLD PO) Take  by mouth.   ??? azithromycin (ZITHROMAX) 250 mg tablet Take 2 tablets today, then take 1 tablet daily   ??? docusate sodium (COLACE) 100 mg capsule Take 100 mg by mouth two (2) times a day.   ??? tadalafil (CIALIS) 5 mg tablet Take 1 Tab by mouth as needed.   ??? ranitidine (ZANTAC) 300 mg tablet Take 300 mg by mouth daily.   ??? fluticasone (FLONASE) 50 mcg/actuation nasal spray 1 spray in each nostril every am   ??? multivitamin (ONE A DAY) tablet Take 1 tablet by mouth daily.   ??? LORazepam (ATIVAN) 0.5 mg tablet Take 1 Tab by mouth every eight (8) hours as needed for Anxiety.   ??? aspirin delayed-release 81 mg tablet Take  by mouth daily.   ??? calcium carbonate (ANTACID EXTRA-STRENGTH) 200 mg calcium (500 mg) Chew Take 1 Tab by mouth daily.     No current facility-administered medications for this visit.       Review of Systems:     (positives in bold), all other systems reviewed and WNL     See HPI       Physical Assessment:     VS:  BP 125/82 mmHg   Pulse 94   Temp(Src) 100.7 ??F (38.2 ??C) (Oral)   Resp 14   Ht 5\' 10"  (1.778 m)   Wt 240 lb 3.2 oz (108.954 kg)   BMI 34.47 kg/m2   SpO2 96%    General:   Well-developed, well-nourished male   Head:   Normocephalic, post oropharynx beefy red with tonsillar enlargement and PND, no exudates or erythema.  B/l nasal congestion.   Lymph:   + anterior cervical LAD, b/l    Cardiovasc:  RRR, no MRG.    Pulmonary:   Lungs clear bilaterally.  Normal respiratory effort.  Skin:    No rashes noted.    Psych:  Dressed appropriately for the weather, pleasant and conversant.  Affect, mood & judgment appropriate.

## 2014-06-24 NOTE — Patient Instructions (Signed)
Things you can do to make yourself feel better:    - First and foremost, make sure you drink enough fluids-- water, Gatorade etc. Wash your hands often, avoid the very young/old, cover your cough, get some rest!    -You should wait 24-48 hours after your fever has cleared ("Afebrile" = no fever, a true fever is 100.4 F or greater) to resume work/school (this means no fever without Tylenol or Motrin!)     - If you are congested, you may take Mucinex 600mg, twice a day. You can also use a "Netty Pot" or Nasal wash daily- my personal favorite!    - If you have a runny nose or sinus congestion/pressure heachache, you may take sudafed, 2 tablets every 4-6 hours (no more than 8 in a day). Be aware that this may make you feel a little jittery. If you have high blood pressure you will want to avoid this medication and any other cold/flu medicine that has pseudoephedrine or phenylephrine in it. If you do have high blood pressure, a safer option would be an antihistamine such as Claritin/Zyrtec/Benadryl, this will help to dry up a runny nose.    - If you have a headache, fever or body aches, you can take tylenol or ibuprofen-- a good rule of thumb is to avoid taking more than 3200mg/day of ether medicine. Be aware that many cough/flu/cold over the counter remedies have acetaminophen in them-- again, no more than 3200mg/day.     - If you have a sore throat you can use over the counter chloraseptic spray or cough drops such as Halls, these have a numbing agent in them. Many times a sore throat can be due to post nasal drip or cough, treating those symptoms will likely help your throat to feel better also.    - If you have a cough you can take an over the counter agents like Robitussin or Tessalon. These will help to suppress your cough so you can get some sleep.     - Call or make an appointment if your symptoms worsen or fail to improve over the next week.       Sore Throat: Care Instructions  Your Care Instructions      Infection by bacteria or a virus causes most sore throats. Cigarette smoke, dry air, air pollution, allergies, and yelling can also cause a sore throat. Sore throats can be painful and annoying. Fortunately, most sore throats go away on their own. If you have a bacterial infection, your doctor may prescribe antibiotics.  Follow-up care is a key part of your treatment and safety. Be sure to make and go to all appointments, and call your doctor if you are having problems. It's also a good idea to know your test results and keep a list of the medicines you take.  How can you care for yourself at home?  ?? If your doctor prescribed antibiotics, take them as directed. Do not stop taking them just because you feel better. You need to take the full course of antibiotics.  ?? Gargle with warm salt water once an hour to help reduce swelling and relieve discomfort. Use 1 teaspoon of salt mixed in 1 cup of warm water.  ?? Take an over-the-counter pain medicine, such as acetaminophen (Tylenol), ibuprofen (Advil, Motrin), or naproxen (Aleve). Read and follow all instructions on the label.  ?? Be careful when taking over-the-counter cold or flu medicines and Tylenol at the same time. Many of these medicines have acetaminophen,   which is Tylenol. Read the labels to make sure that you are not taking more than the recommended dose. Too much acetaminophen (Tylenol) can be harmful.  ?? Drink plenty of fluids. Fluids may help soothe an irritated throat. Hot fluids, such as tea or soup, may help decrease throat pain.  ?? Use over-the-counter throat lozenges to soothe pain. Regular cough drops or hard candy may also help. These should not be given to young children because of the risk of choking.  ?? Do not smoke or allow others to smoke around you. If you need help quitting, talk to your doctor about stop-smoking programs and medicines. These can increase your chances of quitting for good.   ?? Use a vaporizer or humidifier to add moisture to your bedroom. Follow the directions for cleaning the machine.  When should you call for help?  Call your doctor now or seek immediate medical care if:  ?? You have new or worse trouble swallowing.  ?? Your sore throat gets much worse on one side.  Watch closely for changes in your health, and be sure to contact your doctor if you do not get better as expected.   Where can you learn more?   Go to http://www.healthwise.net/[facility]  Enter U420 in the search box to learn more about "Sore Throat: Care Instructions."   ?? 2006-2015 [employer]. Care instructions adapted under license by [employer] (which disclaims liability or warranty for this information). This care instruction is for use with your licensed healthcare professional. If you have questions about a medical condition or this instruction, always ask your healthcare professional. Healthwise, Incorporated disclaims any warranty or liability for your use of this information.  Content Version: 10.7.482551; Current as of: April 18, 2013

## 2014-07-16 ENCOUNTER — Encounter: Attending: Urology | Primary: Internal Medicine

## 2014-07-24 DIAGNOSIS — R0602 Shortness of breath: Secondary | ICD-10-CM | POA: Insufficient documentation

## 2014-08-04 ENCOUNTER — Encounter: Attending: Pain Medicine | Primary: Internal Medicine

## 2014-08-05 ENCOUNTER — Ambulatory Visit: Admit: 2014-08-05 | Discharge: 2014-08-05 | Payer: MEDICARE | Attending: Medical | Primary: Internal Medicine

## 2014-08-05 ENCOUNTER — Ambulatory Visit: Attending: Medical | Primary: Internal Medicine

## 2014-08-05 DIAGNOSIS — M5136 Other intervertebral disc degeneration, lumbar region: Secondary | ICD-10-CM

## 2014-08-05 LAB — AMB POC DRUG SCREEN (G0477)
ALCOHOL UR POC: NEGATIVE
AMPHETAMINES UR POC: NEGATIVE
BARBITURATES UR POC: NEGATIVE
BENZODIAZEPINES UR POC: NEGATIVE
BUPRENORPHINE UR POC: NEGATIVE
CANNABINOIDS UR POC: NEGATIVE
CARISOPRODOL UR POC: NEGATIVE
COCAINE UR POC: NEGATIVE
FENTANYL UR POC: NEGATIVE
MDMA/ECSTASY UR POC: NEGATIVE
METHADONE UR POC: NEGATIVE
METHAMPHETAMINE UR POC: NEGATIVE
METHYLPHENIDATE UR POC: NEGATIVE
OPIATES UR POC: NEGATIVE
OXYCODONE UR POC: NEGATIVE
PHENCYCLIDINE UR POC: NEGATIVE
PROPOXYPHENE UR POC: NEGATIVE
TRAMADOL UR POC: NEGATIVE
TRICYCLICS UR POC: NEGATIVE

## 2014-08-05 MED ORDER — MELOXICAM 7.5 MG TAB
7.5 mg | ORAL_TABLET | Freq: Every day | ORAL | Status: DC
Start: 2014-08-05 — End: 2014-09-03

## 2014-08-05 MED ORDER — METAXALONE 800 MG TAB
800 mg | ORAL_TABLET | Freq: Three times a day (TID) | ORAL | Status: DC | PRN
Start: 2014-08-05 — End: 2014-09-01

## 2014-08-05 MED ORDER — BUPRENORPHINE-NALOXONE 8 MG-2 MG SUBLINGUAL FILM
8-2 mg | ORAL_FILM | Freq: Two times a day (BID) | SUBLINGUAL | Status: DC
Start: 2014-08-05 — End: 2014-11-10

## 2014-08-05 NOTE — Progress Notes (Signed)
HISTORY OF PRESENT ILLNESS  Aaron Reid is a 59 y.o. male. He returns for followup of chronic, severe pain involving the cervical and lumbar spine as well as the left knee. He suffers from post cervical post lumbar laminectomy pain as well as generalized osteoarthritis.  He reports that he was taken to the ER five days ago on 07/31/14 due to shortness of breath, worsening pain, and a feeling "like I couldn't get my wits about me". He underwent a battery of testing, including stress testing, chest xray, labs, etc. All of these tests were normal with the exception of elevated blood pressure. He was discharged with a muscle relaxant and was instructed to follow up with his PCP regarding his blood pressure.   He notes that his low back pain has been much worse over the past several months. This pain is described as stiff, stabbing, and aching. The pain often radiates into the left buttock, posterior thigh, and into the distal calf.  There are associated muscle spasms and cramps in the lower back. These symptoms tend to worsen at night and in the mornings. He denies saddle anesthesia or bowel/bladder dysfunction. He has not had an MRI in many years. We will arrange an CT of the lumbar spine with flexion/extension views to evaluate hardware integrity.  He also requests xrays of the left knee, which he reports has been swollen and painful for several weeks. He has history of arthritis and mensical tears (s/p two arthroscopic repairs several years ago). He denies any overt joint instability, redness, or warmth of the knee.    Medications continue to work well for pain control overall. he is tolerating his medications well, with no untoward side effects noted. The patient is able to stay more active with less discomfort with these current doses. The patient reports an average of 60% relief with this regimen.      He denies new or worsening insomnia or constipation issues. He denies any  falls, injuries, or hospitalizations since the last visit.     A total of 45 minutes was spent with the patient of which more than 50% of the time was spent counseling the patient.   HPI--see above    ROS  Constitutional: Positive for malaise/fatigue.   Gastrointestinal: Positive for heartburn, nausea and constipation.   Musculoskeletal: Positive for myalgias, back pain, joint pain (polyarticular) and neck pain.   Neurological: Positive for weakness (generalized).   Psychiatric/Behavioral: The patient has insomnia.    All other systems reviewed and are negative.  Physical Exam   Constitutional: He is oriented to person, place, and time. He appears well-developed and well-nourished.   In visible discomfort     HENT:   Head: Normocephalic and atraumatic.   Eyes: EOM are normal.   Musculoskeletal:        Left knee: He exhibits decreased range of motion and bony tenderness. He exhibits no effusion, no ecchymosis, normal alignment, no LCL laxity, normal patellar mobility, normal meniscus and no MCL laxity. Tenderness found. Medial joint line tenderness noted.        Lumbar back: He exhibits decreased range of motion, tenderness, pain and spasm.        Back:         Legs:  Marked spasms of lumbar paraspinous musculature noted  Crepitus of bilateral knees with passive ROM   No joint laxity noted  No effusion     Neurological: He is alert and oriented to person, place, and time. He displays  no atrophy and normal reflexes. No cranial nerve deficit or sensory deficit. He exhibits normal muscle tone. Gait (due to pain) abnormal. Coordination normal.   Positive bilateral SLR at 60 degrees  5/5 muscle strength of bilateral lower extremities   Psychiatric: He has a normal mood and affect. His behavior is normal. Judgment and thought content normal.       ASSESSMENT and PLAN    ICD-10-CM ICD-9-CM    1. DDD (degenerative disc disease), lumbar M51.36 722.52 XR SPINE LUMBAR BEND/FLEXION EXTENSION       CANCELED: CT SPINE LUMB W WO CONT   2. Encounter for long-term (current) use of high-risk medication Z79.899 V58.69 DRUG SCREEN      AMB POC DRUG SCREEN (Z3086)   3. Spasmodic torticollis G24.3 333.83    4. S/P lumbar spinal fusion Z98.1 V45.4 XR SPINE LUMBAR BEND/FLEXION EXTENSION      CANCELED: CT SPINE LUMB W WO CONT   5. Spondylosis of lumbar region without myelopathy or radiculopathy M47.816 721.3 XR SPINE LUMBAR BEND/FLEXION EXTENSION      CANCELED: CT SPINE LUMB W WO CONT   6. Lumbar neuritis M54.16 724.4 XR SPINE LUMBAR BEND/FLEXION EXTENSION      CANCELED: CT SPINE LUMB W WO CONT   7. Low back pain radiating to left leg M54.5 724.2 XR SPINE LUMBAR BEND/FLEXION EXTENSION      CANCELED: CT SPINE LUMB W WO CONT   8. Left anterior knee pain M25.562 719.46 XR KNEES BI STAND AP   9. Arthritis of left knee M19.90 716.96 XR KNEES BI STAND AP   10. Chronic neck pain M54.2 723.1     G89.29 338.29    11. Uncomplicated opioid dependence (HCC) F11.20 304.00    12. Chronic pain syndrome G89.4 338.4 buprenorphine-naloxone (SUBOXONE) 8-2 mg film sublingaul film      Plan:  Continue same medications as prescribed for chronic pain  We will arrange a CT with contrast of the lumbar spine with flexion and exention x-rays to evaluate hardware integrity  We will also arrange bilateral standing knee xrays. I am suspicious for advanced degenerative changes in the left knee. We will refer you to   Follow up in 3 months or sooner if needed  Regular exercise and attention to emotional health and diet remain the most effective ways to treat chronic pain of all kinds  You may contact me with questions or concerns through MyChart

## 2014-08-05 NOTE — Progress Notes (Signed)
Nursing Notes    Patient presents to the office today in follow-up.   Patient rates his pain at 7/10 on the numerical pain scale.     Reviewed medications with counts as follows:    Rx Date filled Qty Dispensed Pill Count Last Dose Short   Suboxone 8mg  07/22/14 60 12 today YES                                      Comments: pt did not bring current box of suboxone was counseled about policy. PMP pulled and reviewed last fill date 07/22/14    POC UDS WAS performed in office today  POC UDS done in office today- the pt was(-)for  . Provider reviewed.     Any new labs or imaging since last appointment? yes    Have you been to an emergency room (ER) or urgent care clinic since your last visit?  [facility]    Have you been hospitalized since your last visit? no  If yes, where, when, and reason for visit?     Have you seen or consulted any other health care providers outside of the [facility]  since your last visit?  no  If yes, where, when, and reason for visit?   Amanda PeaBrandy L Ginny Loomer LPN

## 2014-08-07 ENCOUNTER — Ambulatory Visit: Admit: 2014-08-07 | Discharge: 2014-08-07 | Payer: MEDICARE | Attending: Internal Medicine | Primary: Internal Medicine

## 2014-08-07 DIAGNOSIS — J309 Allergic rhinitis, unspecified: Secondary | ICD-10-CM

## 2014-08-07 LAB — AMB POC HEMOGLOBIN A1C: Hemoglobin A1c (POC): 5.9 %

## 2014-08-07 MED ORDER — LORAZEPAM 0.5 MG TAB
0.5 mg | ORAL_TABLET | Freq: Three times a day (TID) | ORAL | Status: AC | PRN
Start: 2014-08-07 — End: ?

## 2014-08-07 MED ORDER — FLUTICASONE 50 MCG/ACTUATION NASAL SPRAY, SUSP
50 mcg/actuation | Freq: Every day | NASAL | Status: DC
Start: 2014-08-07 — End: 2014-10-05

## 2014-08-07 MED ORDER — AMLODIPINE 5 MG TAB
5 mg | ORAL_TABLET | Freq: Every day | ORAL | Status: DC
Start: 2014-08-07 — End: 2014-10-05

## 2014-08-07 NOTE — Progress Notes (Signed)
Aaron Reid is a 59 y.o. male here today for a ED follow up. Patient c/o back and LT knee pain. Pain scale 6/10      1. Have you been to the ER, urgent care clinic since your last visit?  Hospitalized since your last visit?Yes [facility]    2. Have you seen or consulted any other health care providers outside of the [facility] since your last visit?  Include any pap smears or colon screening. no

## 2014-08-07 NOTE — Patient Instructions (Addendum)
.   Medicare Part B Preventive Services Limitations Recommendation Scheduled   Bone Mass Measurement  (age 60 & older, biennial) Requires diagnosis related to osteoporosis or estrogen deficiency. Biennial benefit unless patient has history of long-term glucocorticoid tx or baseline is needed because initial test was by other method Every 2 years or more frequently if medically necessary. Done  N/A       Cardiovascular Screening Blood Tests (every 5 years)  Total cholesterol, HDL, Triglycerides Order as a panel if possible Every 5 years. Done:  Due  07/18/2013       Colorectal Cancer Screening  -Fecal occult blood test (annual)  -Flexible sigmoidoscopy (5y)  -Screening colonoscopy (10y)  -Barium Enema Colorectal Cancer Screening (Ages 70-75 yrs old)  For all patients 30 and older:  -Annual fecal occult blood test or  colonoscopy every 10 years or  -Flexible sigmoidoscopy every 5 years or  -lower endoscopy to be performed more frequently, if advised by GI. referral:  Made over done maybe 5 years ago       Counseling to Prevent Tobacco Use (up to 8 sessions per year)  - Counseling greater than 3 and up to 10 minutes  - Counseling greater than 10 minutes Patients must be asymptomatic of tobacco-related conditions to receive as preventive service Two cessation counseling attempts (up to 8 counseling sessions) per year. N/A   Diabetes Screening Tests (at least every 3 years, Medicare covers annually or at 18-monthintervals for prediabetic patients)    Fasting blood sugar (FBS) or glucose tolerance test (GTT) Patient must be diagnosed with one of the following:  -Hypertension, Dyslipidemia, obesity, previous impaired FBS or GTT  ???Or any two of the following: overweight, FH of diabetes, age ?615 history of gestational diabetes, birth of baby weighing more than 9 pounds Annually or every 6 months if previous diagnosis of elevated FBS, elevated HbA1c, or impaired GTT, or glucosuria. Done:  08/07/2014  5.9 POCT A1C            Diabetes Self-Management Training (DSMT) (no USPSTF recommendation) Requires referral by treating physician for patient with diabetes or renal disease. 10 hours of initial DSMT session of no less than 30 minutes each in a continuous 182-montheriod.  2 hours of follow-up DSMT in subsequent years. Up to 10 hours of initial training within a continuous 12 month period of subsequent years: up to 2 hours of follow-up training each year after the initial year.  N/A   Glaucoma Screening (no USPSTF recommendation) Diabetes mellitus, family history, African American, age 2868r over, Hispanic American, age 7145r over Annually for covered beneficiaries.     Referral place  Last year at [facility]) Screening (annually for increased risk patients)  HIV-1 and HIV-2 by EIA, ELISA, rapid antibody test, or oral mucosa transudate Patient must be at increased risk for HIV infection per USPSTF guidelines or pregnant.  Tests covered annually for patients at increased risk.  Pregnant patients may receive up to 3 test during pregnancy. Annually for beneficiaries at increased risk, including anyone who asks for the test. N/A   Medical Nutrition Therapy (MNT) (for diabetes or renal disease not recommended schedule) Requires referral by treating physician for patient with diabetes or renal disease.  Can be provided in same year as diabetes self-management training (DSMT), and CMS recommends medical nutrition therapy take place after DSMT.  Up to 3 hours for initial year and 2  hours in subsequent years. First year: 3 hours of one-on-one counseling or subsequent years: 2 hours. N/a   Prostate Cancer Screening (annually up to age 17)  - Digital rectal exam (DRE)  - Prostate specific antigen (PSA) Annually (age 90 or over), DRE not paid separately when covered E/M service is provided on same date Once every 12 months for patients age older than 60 years of age includes: digital  rectal exam and/or prostate specific antigen test.   2/13./2015         Seasonal Influenza Vaccination (annually)  Once per fall or winter season. Decline:             Pneumococcal Vaccination (once after 98)  Once after age 55 and if more than 5 years since last vaccination and/or uncertainty of vaccine status. Pneumococcal:    Decline  Prevnar 13:  N/a     Hepatitis B Vaccinations (if medium/high risk) Medium/high risk factors:  End-stage renal disease,  Hemophiliacs who received Factor VIII or IX concentrates, Clients of institutions for the mentally retarded, Persons who live in the same house as a HepB virus carrier, Homosexual men, Illicit injectable drug abusers. Schedule course of vaccines if patient not previously vaccinated  *additional shots if medically necessary. N/A   Screening Mammography (biennial age 41-74)? Annually (age 18 or over) Age 52 through 59: one baseline or aged 79 and older: annually. Done:    N/A       Screening Pap Tests and Pelvic Examination (up to age 17 and after 71 if unknown history or abnormal study last 10 years) Every 24 months except high risk Annually if at hight risk for developing cervical or vaginal cancer, or childbearing, age with abnormal Pap test within past 3 years or every 2 years for women at normal risk. Done:  N/A           Ultrasound Screening for Abdominal Aortic Aneurysm (AAA) (once) Patient must be referred through IPPE and not have had a screening for abdominal aortic aneurysm before under Medicare.  Limited to patients who meet one of the following criteria:  - Men who are 25-46 years old and have smoked more than 100 cigarettes in their lifetime.  -Anyone with a FH of AAA  -Anyone recommended for screening by USPSTF Once in a lifetime. N/A           Advance Care Planning: Care Instructions  Your Care Instructions  It can be hard to live with an illness that cannot be cured. But if your  health is getting worse, you may want to make decisions about end-of-life care. Planning for the end of your life does not mean that you are giving up. It is a way to make sure that your wishes are met. Clearly stating your wishes can make it easier for your loved ones. Making plans while you are still able may also ease your mind and make your final days less stressful and more meaningful.  Follow-up care is a key part of your treatment and safety. Be sure to make and go to all appointments, and call your doctor if you are having problems. It's also a good idea to know your test results and keep a list of the medicines you take.  What can you do to plan for the end of life?  ?? You can bring these issues up with your doctor. You do not need to wait until your doctor starts the conversation. You might start with "I would not  be willing to live with ...." When you complete this sentence it helps your doctor understand your wishes.  ?? Talk openly and honestly with your doctor. This is the best way to understand the decisions you will need to make as your health changes. Know that you can always change your mind.  ?? Ask your doctor about commonly used life-support measures. These include tube feedings, breathing machines, and fluids given through a vein (IV). Understanding these treatments will help you decide whether you want them.  ?? You may choose to have these life-supporting treatments for a limited time. This allows a trial period to see whether they will help you. You may also decide that you want your doctor to take only certain measures to keep you alive. It is important to spell out these conditions so that your doctor and family understand them.  ?? Talk to your doctor about how long you are likely to live. He or she may be able to give you an idea of what usually happens with your specific illness.  ?? Think about preparing papers that state your wishes. This way there will  not be any confusion about what you want. You can change your instructions at any time.  Which papers should you prepare?  Advance directives are legal papers that tell doctors how you want to be cared for at the end of your life. You do not need a lawyer to write these papers. Ask your doctor or your state health department for information on how to write your advance directives. They may have the forms for each of these types of papers. Make sure your doctor has a copy of these on file, and give a copy to a family member or close friend.  ?? Consider a do-not-resuscitate order (DNR). This order asks that no extra treatments be done if your heart stops or you stop breathing. Extra treatments may include electrical shock to restart your heart or a machine to breathe for you. If you decide to have a DNR order, ask your doctor to explain and write it. Place the order in your home where everyone can easily see it.  ?? Consider a living will. A living will explains your wishes in case you are in a coma or cannot communicate. Living wills tell doctors to use or not use treatments that would keep you alive. You must have one or two witnesses or a notary present when you sign this form.  ?? Consider a durable power of attorney. This allows you to name a person to make decisions about your care if you are not able to. Most people ask a close friend or family member. Talk to this person about the kinds of treatments you want and those that you do not want. Make sure this person understands your wishes. If this person is not the health care agent named in your advance directive, also talk with your health care agent.  These legal papers are simple to change. Tell your doctor what you want to change, and ask him or her to make a note in your medical file. Give your family updated copies of the papers.   Where can you learn more?   Go to [email or web address]   Enter P184 in the search box to learn more about "Advance Care Planning: Care Instructions."   ?? 2006-2015 Health[employer], Inc[employer]. Care instructions adapted under license by [employer] (which disclaims liability or warranty for this information). This care instruction is for use with your  licensed Neurosurgeon. If you have questions about a medical condition or this instruction, always ask your healthcare professional. [place] any warranty or liability for your use of this information.  Content Version: 10.7.482551; Current as of: July 25, 2013              Learning About Living Eugenie Birks  What is a living will?  A living will is a legal form you use to write down the kind of care you want at the end of your life. It is used by the health professionals who will treat you if you aren't able to decide for yourself.  If you put your wishes in writing, your loved ones and others will know what kind of care you want. They won't need to guess. This can ease your mind and be helpful to others.  A living will is not the same as an estate or property will. An estate will explains what you want to happen with your money and property after you die.  Is a living will a legal document?  A living will is a legal document. Each state has its own laws about living wills. If you move to another state, make sure that your living will is legal in the state where you now live. Or you might use a universal form that has been approved by many states. This kind of form can sometimes be completed and stored online. Your electronic copy will then be available wherever you have a connection to the Internet. In most cases, doctors will respect your wishes even if you have a form from a different state.  ?? You don't need an attorney to complete a living will. But legal advice can be helpful if your state's laws are unclear, your health history is  complicated, or your family can't agree on what should be in your living will.  ?? You can change your living will at any time. Some people find that their wishes about end-of-life care change as their health changes.  ?? In addition to making a living will, think about completing a medical power of attorney form. This form lets you name the person you want to make end-of-life treatment decisions for you (your "health care agent") if you're not able to. Many hospitals and nursing homes will give you the forms you need to complete a living will and a medical power of attorney.  ?? Your living will is used only if you can't make or communicate decisions for yourself anymore. If you become able to make decisions again, you can accept or refuse any treatment, no matter what you wrote in your living will.  ?? Your state may offer an online registry. This is a place where you can store your living will online so the doctors and nurses who need to treat you can find it right away.  What should you think about when creating a living will?  Talk about your end-of-life wishes with your family members and your doctor. Let them know what you want. That way the people making decisions for you won't be surprised by your choices.  Think about these questions as you make your living will:  ?? Do you know enough about life support methods that might be used? If not, talk to your doctor so you know what might be done if you can't breathe on your own, your heart stops, or you're unable to swallow.  ?? What things would you still want to be able  to do after you receive life-support methods? Would you want to be able to walk? To speak? To eat on your own? To live without the help of machines?  ?? If you have a choice, where do you want to be cared for? In your home? At a hospital or nursing home?  ?? Do you want certain religious practices performed if you become very ill?   ?? If you have a choice at the end of your life, where would you prefer to die? At home? In a hospital or nursing home? Somewhere else?  ?? Would you prefer to be buried or cremated?  ?? Do you want your organs to be donated after you die?  What should you do with your living will?  ?? Make sure that your family members and your health care agent have copies of your living will.  ?? Give your doctor a copy of your living will to keep in your medical record. If you have more than one doctor, make sure that each one has a copy.  ?? You may want to put a copy of your living will where it can be easily found.   Where can you learn more?   Go to [email or web address].com.cy  Enter K356 in the search box to learn more about "[place]."   ?? 2006-2015 Healthwise, Incorporated. Care instructions adapted under license by [employer] (which disclaims liability or warranty for this information). This care instruction is for use with your licensed healthcare professional. If you have questions about a medical condition or this instruction, always ask your healthcare professional. [place] any warranty or liability for your use of this information.  Content Version: 10.7.482551; Current as of: July 25, 2013              Advance Directives: Care Instructions  Your Care Instructions  An advance directive is a legal way to state your wishes at the end of your life. It tells your family and your doctor what to do if you can no longer say what you want.  There are two main types of advance directives. You can change them any time that your wishes change.  ?? A living will tells your family and your doctor your wishes about life support and other treatment.  ?? A medical power of attorney lets you name a person to make treatment decisions for you when you can't speak for yourself. This person is called a health care agent.   If you do not have an advance directive, decisions about your medical care may be made by a doctor or a judge who doesn't know you.  It may help to think of an advance directive as a gift to the people who care for you. If you have one, they won't have to make tough decisions by themselves.  Follow-up care is a key part of your treatment and safety. Be sure to make and go to all appointments, and call your doctor if you are having problems. It's also a good idea to know your test results and keep a list of the medicines you take.  How can you care for yourself at home?  ?? Discuss your wishes with your loved ones and your doctor. This way, there are no surprises.  ?? Many states have a unique form. Or you might use a universal form that has been approved by many states. This kind of form can sometimes be completed and stored online. Your electronic copy will  then be available wherever you have a connection to the Internet. In most cases, doctors will respect your wishes even if you have a form from a different state.  ?? You don't need a lawyer to do an advance directive. But you may want to get legal advice.  ?? Think about these questions when you prepare an advance directive:  ?? Who do you want to make decisions about your medical care if you are not able to? Many people choose a family member, close friend, or doctor.  ?? Do you know enough about life support methods that might be used? If not, talk to your doctor so you understand.  ?? What are you most afraid of that might happen? You might be afraid of having pain, losing your independence, or being kept alive by machines.  ?? Where would you prefer to die? Choices include your home, a hospital, or a nursing home.  ?? Would you like to have information about hospice care to support you and your family?  ?? Do you want to donate organs when you die?  ?? Do you want certain religious practices performed before you die? If so, put your wishes in the advance directive.   ?? Read your advance directive every year, and make changes as needed.  When should you call for help?  Be sure to contact your doctor if you have any questions.   Where can you learn more?   Go to [email or web address]  Enter R264 in the search box to learn more about "Advance Directives: Care Instructions."   ?? 2006-2015 Healthwise, Inc[employer]. Care instructions adapted under license by [employer] (which disclaims liability or warranty for this information). This care instruction is for use with your licensed healthcare professional. If you have questions about a medical condition or this instruction, always ask your healthcare professional. [place] any warranty or liability for your use of this information.  Content Version: 10.7.482551; Current as of: July 25, 2013

## 2014-08-07 NOTE — Progress Notes (Signed)
Aaron Reid Kitchen.    Aaron Reid is a 59 y.o. male and presents for annual Medicare Wellness Visit.    Problem List: Reviewed with patient and discussed risk factors.    Patient Active Problem List   Diagnosis Code   ??? LBP (low back pain) M54.5   ??? DDD (degenerative disc disease), lumbar M51.36   ??? S/P lumbar spinal fusion Z98.1   ??? Lumbar spondylosis M47.816   ??? Neck pain M54.2   ??? DDD (degenerative disc disease), cervical M50.30   ??? S/P cervical spinal fusion Z98.1   ??? Spondylolisthesis of cervical region M43.12   ??? Cervical spondylosis M47.812   ??? Foraminal stenosis of cervical region M99.81   ??? DJD (degenerative joint disease), multiple sites M15.9   ??? Musculoskeletal pain M79.1   ??? Spasmodic torticollis G24.3   ??? GAD (generalized anxiety disorder) F41.1   ??? Hepatitis C B19.20   ??? GERD (gastroesophageal reflux disease) K21.9   ??? Insomnia G47.00   ??? Neurogenic bladder N31.9   ??? Opioid dependence (HCC) F11.20   ??? Neurogenic bladder N31.9   ??? Chronic hepatitis C (HCC) B18.2   ??? Unspecified essential hypertension I10   ??? Encounter for long-term (current) use of high-risk medication Z79.899       Current medical providers:  Patient Care Team:  Donzetta MattersAlessio Salsano, MD as PCP - General (Internal Medicine)  Rosie FateJin X Zhang, MD (Gastroenterology)  Donzetta MattersAlessio Salsano, MD (Internal Medicine)    PSH: Reviewed with patient  Past Surgical History   Procedure Laterality Date   ??? Endoscopy, colon, diagnostic     ??? Hx orthopaedic       spinal fusion, lumbar, cervical   ??? Hx orthopaedic       spinal fusion, lumbar cervical        SH: Reviewed with patient  History   Substance Use Topics   ??? Smoking status: Never Smoker    ??? Smokeless tobacco: Never Used   ??? Alcohol Use: No       FH: Reviewed with patient  Family History   Problem Relation Age of Onset   ??? Heart Disease Sister    ??? Diabetes Brother    ??? Stroke Brother        Medications/Allergies: Reviewed with patient  Current Outpatient Prescriptions on File Prior to Visit    Medication Sig Dispense Refill   ??? meloxicam (MOBIC) 7.5 mg tablet Take 1-2 Tabs by mouth daily. As needed for pain 60 Tab 1   ??? metaxalone (SKELAXIN) 800 mg tablet Take 0.5-1 Tabs by mouth three (3) times daily as needed. For muscle spasms 90 Tab 5   ??? docusate sodium (COLACE) 100 mg capsule Take 100 mg by mouth two (2) times a day.     ??? tadalafil (CIALIS) 5 mg tablet Take 1 Tab by mouth as needed. 30 Tab 0   ??? ranitidine (ZANTAC) 300 mg tablet Take 300 mg by mouth daily.     ??? multivitamin (ONE A DAY) tablet Take 1 tablet by mouth daily.     ??? aspirin delayed-release 81 mg tablet Take  by mouth daily.     ??? calcium carbonate (ANTACID EXTRA-STRENGTH) 200 mg calcium (500 mg) Chew Take 1 Tab by mouth daily.     ??? [START ON 08/20/2014] buprenorphine-naloxone (SUBOXONE) 8-2 mg film sublingaul film 1 Film by SubLINGual route two (2) times a day for 30 days. Max Daily Amount: 2 Film. Fill on/after 3/17, 4/15,  And 5/14  Indications:  CHRONIC PAIN, OPIOID DEPENDENCE 60 Film 2   ??? DEXTROMETHORPHAN HBR/CHLOR-MAL (ROBITUSSIN COUGH & COLD PO) Take  by mouth.     ??? azithromycin (ZITHROMAX) 250 mg tablet Take 2 tablets today, then take 1 tablet daily 6 Tab 0     No current facility-administered medications on file prior to visit.      Allergies   Allergen Reactions   ??? Latex Rash   ??? Cymbalta [Duloxetine] Anxiety   ??? Duragesic [Fentanyl] Drowsiness     Altered LOC       Objective:  BP 145/87 mmHg   Pulse 73   Temp(Src) 98.4 ??F (36.9 ??C) (Oral)   Resp 18   Ht  (1.778 m)   Wt 235 lb (106.595 kg)   BMI 33.72 kg/m2   SpO2 96% Body mass index is 33.72 kg/(m^2).    Assessment of cognitive impairment: Alert and oriented x 4    Depression Screen:   PHQ 2 / 9, over the last two weeks 09/24/2013   Little interest or pleasure in doing things Not at all   Feeling down, depressed or hopeless Not at all   Total Score PHQ 2 0       Fall Risk Assessment:  No flowsheet data found.    Functional Ability:    Does the patient exhibit a steady gait?  yes   How long did it take the patient to get up and walk from a sitting position? 2 sec   Is the patient self reliant?  (ie can do own laundry, meals, household chores)  yes     Does the patient handle his/her own medications?  yes     Does the patient handle his/her own money?   yes     Is the patient???s home safe (ie good lighting, handrails on stairs and bath, etc.)?   yes     Did you notice or did patient express any hearing difficulties?   no     Did you notice or did patient express any vision difficulties?   No wears glasses   Were distance and reading eye charts used?  no       Advance Care Planning:   Patient was offered the opportunity to discuss advance care planning:  yes     Does patient have an Advance Directive:  no   If no, did you provide information on Caring Connections?  yes       Plan:      Orders Placed This Encounter   ??? DISCONTD: amLODIPine-benazepril (LOTREL) 5-10 mg per capsule   ??? amLODIPine (NORVASC) 5 mg tablet   ??? LORazepam (ATIVAN) 0.5 mg tablet   ??? fluticasone (FLONASE) 50 mcg/actuation nasal spray       Health Maintenance   Topic Date Due   ??? INFLUENZA AGE 5 TO ADULT  01/04/2015   ??? Td Q 10 Yrs Age > 18  08/06/2024   ??? COLONOSCOPY  08/06/2024   ??? Tdap Age > 64  Addressed       *Patient verbalized understanding and agreement with the plan.  A copy of the After Visit Summary with personalized health plan was given to the patient today.    Referrals made to Gastro,urology and opthalmology.  Patient is return for lab work with physical before the end of the month.

## 2014-08-07 NOTE — Progress Notes (Signed)
HISTORY OF PRESENT ILLNESS  Aaron Reid is a 59 y.o. male.  HPI  Chronic low back pain under pain mgmt  Chronic liver disease stable  Recent ER for chest pain, dyspnea  W/u negative  Review of Systems   Respiratory: Positive for shortness of breath.    Cardiovascular: Positive for chest pain.   Musculoskeletal: Positive for back pain.   All other systems reviewed and are negative.    Past Medical History   Diagnosis Date   ??? Cervical fusion syndrome 10/2003   ??? Chronic pain 1980     lower back    ??? LBP (low back pain) 12/24/2012   ??? DDD (degenerative disc disease), lumbar 12/24/2012   ??? S/P lumbar spinal fusion 12/24/2012   ??? Lumbar spondylosis 12/24/2012   ??? Neck pain 12/24/2012   ??? DDD (degenerative disc disease), cervical 12/24/2012   ??? S/P cervical spinal fusion 12/24/2012   ??? Spondylolisthesis of cervical region 12/24/2012   ??? Cervical spondylosis 12/24/2012   ??? Foraminal stenosis of cervical region 12/24/2012   ??? DJD (degenerative joint disease), multiple sites 12/24/2012   ??? Musculoskeletal pain 12/24/2012   ??? Spasmodic torticollis 12/24/2012   ??? GAD (generalized anxiety disorder) 12/24/2012   ??? Hepatitis C 12/24/2012   ??? Insomnia 12/24/2012   ??? Opioid dependence (HCC) 02/21/2013   ??? Cervical fusion syndrome 2005   ??? Chronic pain 1980     lower back   ??? Neurogenic bladder    ??? GERD (gastroesophageal reflux disease)    ??? Hepatitis C infection    ??? Back injury    ??? Neck injury    ??? Head injury    ??? Arthritis      Current Outpatient Prescriptions on File Prior to Visit   Medication Sig Dispense Refill   ??? meloxicam (MOBIC) 7.5 mg tablet Take 1-2 Tabs by mouth daily. As needed for pain 60 Tab 1   ??? metaxalone (SKELAXIN) 800 mg tablet Take 0.5-1 Tabs by mouth three (3) times daily as needed. For muscle spasms 90 Tab 5   ??? docusate sodium (COLACE) 100 mg capsule Take 100 mg by mouth two (2) times a day.     ??? tadalafil (CIALIS) 5 mg tablet Take 1 Tab by mouth as needed. 30 Tab 0    ??? ranitidine (ZANTAC) 300 mg tablet Take 300 mg by mouth daily.     ??? fluticasone (FLONASE) 50 mcg/actuation nasal spray 1 spray in each nostril every am 1 Bottle 2   ??? multivitamin (ONE A DAY) tablet Take 1 tablet by mouth daily.     ??? LORazepam (ATIVAN) 0.5 mg tablet Take 1 Tab by mouth every eight (8) hours as needed for Anxiety. 30 Tab 0   ??? aspirin delayed-release 81 mg tablet Take  by mouth daily.     ??? calcium carbonate (ANTACID EXTRA-STRENGTH) 200 mg calcium (500 mg) Chew Take 1 Tab by mouth daily.     ??? [START ON 08/20/2014] buprenorphine-naloxone (SUBOXONE) 8-2 mg film sublingaul film 1 Film by SubLINGual route two (2) times a day for 30 days. Max Daily Amount: 2 Film. Fill on/after 3/17, 4/15,  And 5/14  Indications: CHRONIC PAIN, OPIOID DEPENDENCE 60 Film 2   ??? DEXTROMETHORPHAN HBR/CHLOR-MAL (ROBITUSSIN COUGH & COLD PO) Take  by mouth.     ??? azithromycin (ZITHROMAX) 250 mg tablet Take 2 tablets today, then take 1 tablet daily 6 Tab 0     No current facility-administered medications on file  prior to visit.     BP 145/87 mmHg   Pulse 73   Temp(Src) 98.4 ??F (36.9 ??C) (Oral)   Resp 18   Ht  (1.778 m)   Wt 235 lb (106.595 kg)   BMI 33.72 kg/m2   SpO2 96%     Physical Exam   Constitutional: He appears well-developed and well-nourished. No distress.   Cardiovascular: Normal rate, regular rhythm, normal heart sounds and intact distal pulses.  Exam reveals no gallop and no friction rub.    No murmur heard.  Pulmonary/Chest: Effort normal and breath sounds normal. No respiratory distress. He has no wheezes. He has no rales. He exhibits no tenderness.   Musculoskeletal: He exhibits no edema or tenderness.   Skin: He is not diaphoretic.   Vitals reviewed.    Reviewed ER records  ASSESSMENT and PLAN  hbp   Atypical chest pain  clbp  Chronic hep c  Plan  Ativan prn  refills

## 2014-08-12 ENCOUNTER — Encounter: Attending: Urology | Primary: Internal Medicine

## 2014-08-18 NOTE — Telephone Encounter (Signed)
Opened in error

## 2014-08-19 NOTE — Telephone Encounter (Signed)
Called patient to schedule an appt with Lytle Buttearol Ascher NP for a Colon Screening. Explained to the patient in Vm that this wasn't an examination nor is this a procedure... Will try to call back later today...JDG

## 2014-08-20 ENCOUNTER — Inpatient Hospital Stay: Admit: 2014-08-20 | Payer: MEDICARE | Attending: Medical | Primary: Internal Medicine

## 2014-08-20 ENCOUNTER — Encounter

## 2014-08-20 DIAGNOSIS — M5136 Other intervertebral disc degeneration, lumbar region: Secondary | ICD-10-CM

## 2014-08-20 DIAGNOSIS — M25561 Pain in right knee: Secondary | ICD-10-CM

## 2014-08-20 MED ORDER — IOPAMIDOL 61 % IV SOLN
300 mg iodine /mL (61 %) | Freq: Once | INTRAVENOUS | Status: AC
Start: 2014-08-20 — End: 2014-08-20
  Administered 2014-08-20: 21:00:00 via INTRAVENOUS

## 2014-08-20 MED FILL — ISOVUE-300  61 % INTRAVENOUS SOLUTION: 300 mg iodine /mL (61 %) | INTRAVENOUS | Qty: 100

## 2014-08-26 ENCOUNTER — Encounter: Attending: Internal Medicine | Primary: Internal Medicine

## 2014-09-01 ENCOUNTER — Ambulatory Visit: Admit: 2014-09-01 | Discharge: 2014-09-01 | Payer: MEDICARE | Attending: Internal Medicine | Primary: Internal Medicine

## 2014-09-01 DIAGNOSIS — Z0000 Encounter for general adult medical examination without abnormal findings: Secondary | ICD-10-CM

## 2014-09-01 MED ORDER — TIZANIDINE 2 MG TAB
2 mg | ORAL_TABLET | ORAL | Status: DC
Start: 2014-09-01 — End: 2015-05-07

## 2014-09-01 MED ORDER — NALOXEGOL 25 MG TABLET
25 mg | ORAL_TABLET | Freq: Every day | ORAL | Status: DC
Start: 2014-09-01 — End: 2014-10-05

## 2014-09-01 NOTE — Progress Notes (Signed)
Aaron Reid is a 59 y.o. [name] who presents today for a physical. Patient c/o back pain. Pain scale 6/10    There are no preventive care reminders to display for this patient.        1. Have you been to the ER, urgent care clinic since your last visit?  Hospitalized since your last visit?No    2. Have you seen or consulted any other health care providers outside of the [facility] since your last visit?  Include any pap smears or colon screening. No    Learning Assessment 06/24/2014   PRIMARY LEARNER Patient   HIGHEST LEVEL OF EDUCATION - PRIMARY LEARNER  > 4 YEARS OF COLLEGE   BARRIERS PRIMARY LEARNER NONE   CO-LEARNER CAREGIVER No   PRIMARY LANGUAGE ENGLISH   LEARNER PREFERENCE PRIMARY READING   ANSWERED BY patient   RELATIONSHIP SELF       Abuse Screening Questionnaire 05/19/2013   Do you ever feel afraid of your partner? N   Are you in a relationship with someone who physically or mentally threatens you? N   Is it safe for you to go home? YJeannie Fend

## 2014-09-01 NOTE — Progress Notes (Signed)
Subjective:     Aaron Reid is a 59 y.o. male presenting for annual exam and complete physical.    Patient Active Problem List   Diagnosis Code   ??? LBP (low back pain) M54.5   ??? DDD (degenerative disc disease), lumbar M51.36   ??? S/P lumbar spinal fusion Z98.1   ??? Lumbar spondylosis M47.816   ??? Neck pain M54.2   ??? DDD (degenerative disc disease), cervical M50.30   ??? S/P cervical spinal fusion Z98.1   ??? Spondylolisthesis of cervical region M43.12   ??? Cervical spondylosis M47.812   ??? Foraminal stenosis of cervical region M99.81   ??? DJD (degenerative joint disease), multiple sites M15.9   ??? Musculoskeletal pain M79.1   ??? Spasmodic torticollis G24.3   ??? GAD (generalized anxiety disorder) F41.1   ??? Hepatitis C B19.20   ??? GERD (gastroesophageal reflux disease) K21.9   ??? Insomnia G47.00   ??? Neurogenic bladder N31.9   ??? Opioid dependence (HCC) F11.20   ??? Neurogenic bladder N31.9   ??? Chronic hepatitis C (HCC) B18.2   ??? Unspecified essential hypertension I10   ??? Encounter for long-term (current) use of high-risk medication Z79.899     Patient Active Problem List    Diagnosis Date Noted   ??? Encounter for long-term (current) use of high-risk medication 05/06/2014   ??? Unspecified essential hypertension 02/12/2014   ??? Neurogenic bladder 02/25/2013   ??? Chronic hepatitis C (HCC) 02/25/2013   ??? Opioid dependence (HCC) 02/21/2013   ??? LBP (low back pain) 12/24/2012   ??? DDD (degenerative disc disease), lumbar 12/24/2012   ??? S/P lumbar spinal fusion 12/24/2012   ??? Lumbar spondylosis 12/24/2012   ??? Neck pain 12/24/2012   ??? DDD (degenerative disc disease), cervical 12/24/2012   ??? S/P cervical spinal fusion 12/24/2012   ??? Spondylolisthesis of cervical region 12/24/2012   ??? Cervical spondylosis 12/24/2012   ??? Foraminal stenosis of cervical region 12/24/2012   ??? DJD (degenerative joint disease), multiple sites 12/24/2012   ??? Musculoskeletal pain 12/24/2012   ??? Spasmodic torticollis 12/24/2012    ??? GAD (generalized anxiety disorder) 12/24/2012   ??? Hepatitis C 12/24/2012   ??? GERD (gastroesophageal reflux disease) 12/24/2012   ??? Insomnia 12/24/2012   ??? Neurogenic bladder 12/24/2012     Current Outpatient Prescriptions   Medication Sig Dispense Refill   ??? tiZANidine (ZANAFLEX) 2 mg tablet 1-2 every 6 hours as needed for pain 60 Tab 3   ??? naloxegol (MOVANTIK) 25 mg tab tablet Take 1 Tab by mouth daily. 90 Tab 0   ??? amLODIPine (NORVASC) 5 mg tablet Take 1 Tab by mouth daily. 30 Tab 1   ??? LORazepam (ATIVAN) 0.5 mg tablet Take 1 Tab by mouth every eight (8) hours as needed for Anxiety. 30 Tab 1   ??? fluticasone (FLONASE) 50 mcg/actuation nasal spray 2 Sprays by Both Nostrils route daily. 1 spray in each nostril every am 1 Bottle 3   ??? meloxicam (MOBIC) 7.5 mg tablet Take 1-2 Tabs by mouth daily. As needed for pain 60 Tab 1   ??? buprenorphine-naloxone (SUBOXONE) 8-2 mg film sublingaul film 1 Film by SubLINGual route two (2) times a day for 30 days. Max Daily Amount: 2 Film. Fill on/after 3/17, 4/15,  And 5/14  Indications: CHRONIC PAIN, OPIOID DEPENDENCE 60 Film 2   ??? DEXTROMETHORPHAN HBR/CHLOR-MAL (ROBITUSSIN COUGH & COLD PO) Take  by mouth.     ??? docusate sodium (COLACE) 100 mg capsule Take 100 mg  by mouth two (2) times a day.     ??? tadalafil (CIALIS) 5 mg tablet Take 1 Tab by mouth as needed. 30 Tab 0   ??? ranitidine (ZANTAC) 300 mg tablet Take 300 mg by mouth daily.     ??? multivitamin (ONE A DAY) tablet Take 1 tablet by mouth daily.     ??? aspirin delayed-release 81 mg tablet Take  by mouth daily.     ??? calcium carbonate (ANTACID EXTRA-STRENGTH) 200 mg calcium (500 mg) Chew Take 1 Tab by mouth daily.     ??? azithromycin (ZITHROMAX) 250 mg tablet Take 2 tablets today, then take 1 tablet daily 6 Tab 0     Allergies   Allergen Reactions   ??? Latex Rash   ??? Cymbalta [Duloxetine] Anxiety   ??? Duragesic [Fentanyl] Drowsiness     Altered LOC     Past Medical History   Diagnosis Date   ??? Cervical fusion syndrome 10/2003    ??? Chronic pain 1980     lower back    ??? LBP (low back pain) 12/24/2012   ??? DDD (degenerative disc disease), lumbar 12/24/2012   ??? S/P lumbar spinal fusion 12/24/2012   ??? Lumbar spondylosis 12/24/2012   ??? Neck pain 12/24/2012   ??? DDD (degenerative disc disease), cervical 12/24/2012   ??? S/P cervical spinal fusion 12/24/2012   ??? Spondylolisthesis of cervical region 12/24/2012   ??? Cervical spondylosis 12/24/2012   ??? Foraminal stenosis of cervical region 12/24/2012   ??? DJD (degenerative joint disease), multiple sites 12/24/2012   ??? Musculoskeletal pain 12/24/2012   ??? Spasmodic torticollis 12/24/2012   ??? GAD (generalized anxiety disorder) 12/24/2012   ??? Hepatitis C 12/24/2012   ??? Insomnia 12/24/2012   ??? Opioid dependence (HCC) 02/21/2013   ??? Cervical fusion syndrome 2005   ??? Chronic pain 1980     lower back   ??? Neurogenic bladder    ??? GERD (gastroesophageal reflux disease)    ??? Hepatitis C infection    ??? Back injury    ??? Neck injury    ??? Head injury    ??? Arthritis      Past Surgical History   Procedure Laterality Date   ??? Endoscopy, colon, diagnostic     ??? Hx orthopaedic       spinal fusion, lumbar, cervical   ??? Hx orthopaedic       spinal fusion, lumbar cervical     Family History   Problem Relation Age of Onset   ??? Heart Disease Sister    ??? Diabetes Brother    ??? Stroke Brother      History   Substance Use Topics   ??? Smoking status: Never Smoker    ??? Smokeless tobacco: Never Used   ??? Alcohol Use: No        to be done     Review of Systems  A comprehensive review of systems was negative except for: Gastrointestinal: positive for constipation    Objective:     BP 138/86 mmHg   Pulse 78   Temp(Src) 97.8 ??F (36.6 ??C) (Oral)   Resp 18   Ht 5\' 10"  (1.778 m)   Wt 234 lb (106.142 kg)   BMI 33.58 kg/m2   SpO2 96%  Physical exam:   General appearance - alert, well appearing, and in no distress, oriented to person, place, and time, overweight and well hydrated  Mental status - alert, oriented to person, place, and time, normal mood,  behavior, speech, dress, motor activity, and thought processes  Eyes - pupils equal and reactive, extraocular eye movements intact, sclera anicteric  Mouth - mucous membranes moist, pharynx normal without lesions and tongue normal  Neck - supple, no significant adenopathy, carotids upstroke normal bilaterally, no bruits  Lymphatics - no palpable lymphadenopathy, no hepatosplenomegaly  Chest - clear to auscultation, no wheezes, rales or rhonchi, symmetric air entry  Heart - normal rate, regular rhythm, normal S1, S2, no murmurs, rubs, clicks or gallops  Abdomen - soft, nontender, nondistended, no masses or organomegaly  bowel sounds normal  no bladder distension noted  no abdominal bruits  no pulsatile masses  no CVA tenderness  Back exam - full range of motion, no tenderness, palpable spasm or pain on motion, normal reflexes and strength bilateral lower extremities, sensory exam intact bilateral lower extremities  Neurological - alert, oriented, normal speech, no focal findings or movement disorder noted, screening mental status exam normal, neck supple without rigidity, cranial nerves II through XII intact, motor and sensory grossly normal bilaterally, normal muscle tone, no tremors, strength 5/5  Musculoskeletal - no joint tenderness, deformity or swelling, no muscular tenderness noted, full range of motion without pain  Extremities - peripheral pulses normal, no pedal edema, no clubbing or cyanosis, no pedal edema noted, intact peripheral pulses  Skin - normal coloration and turgor, no rashes, no suspicious skin lesions noted     Assessment/Plan:     Routine exam  routine labs ordered.   current treatment plan is effective, no change in therapy  trial movantik.

## 2014-09-02 NOTE — Addendum Note (Signed)
Addended by: Donzetta MattersSALSANO, Lleyton Byers on: 09/02/2014 09:58 AM      Modules accepted: Orders

## 2014-09-03 ENCOUNTER — Ambulatory Visit: Admit: 2014-09-03 | Discharge: 2014-09-03 | Payer: MEDICARE | Attending: Medical | Primary: Internal Medicine

## 2014-09-03 ENCOUNTER — Ambulatory Visit: Attending: Medical | Primary: Internal Medicine

## 2014-09-03 DIAGNOSIS — M47816 Spondylosis without myelopathy or radiculopathy, lumbar region: Secondary | ICD-10-CM

## 2014-09-03 MED ORDER — MELOXICAM 7.5 MG TAB
7.5 mg | ORAL_TABLET | Freq: Every day | ORAL | Status: DC
Start: 2014-09-03 — End: 2015-01-29

## 2014-09-03 NOTE — Patient Instructions (Signed)
Plan:  Continue same medications as prescribed for chronic pain  Consider radiofrequency ablation, epidural steroid injections, spinal cord stimulator therapy, and or lumbar surgery (if all else fails) to address severe back pain  Follow up in 2-3 months or sooner if needed  Regular exercise and attention to emotional health and diet remain the most effective ways to treat chronic pain of all kinds  You may contact me with questions or concerns through MyChart  Learning About Medial Branch Block and Neurotomy  What are medial branch block and neurotomy?     Facet joints connect your vertebrae to each other. Problems in these joints can cause chronic (long-term) pain in the neck or back. They can sometimes affect the shoulders, arms, buttocks, or legs.  Medial branch nerves are the nerves that carry many of the pain messages from your facet joints.  Radiofrequency medial branch neurotomy is a type of medial branch neurotomy that is used to relieve arthritis pain. It uses radio waves to damage nerves in your neck or back so that they can no longer send pain messages to your brain.  Before your doctor knows if a neurotomy will help you, he or she will do a medial branch block to find out if certain nerves are the ones that are a source of your pain. You will need two separate visits to the outpatient center or hospital to have both procedures.  How is a medial branch block done?  The doctor will use a tiny needle to numb the skin where you will get the block. Then he or she puts the block needle into the numbed area. You may feel some pressure, but you should not feel pain. Using fluoroscopy (live X-ray) to guide the needle, the doctor injects medicine onto one or more nerves to make them numb.  If you get relief from your pain in the next 4 to 6 hours, it's a sign that those nerves may be contributing to your pain. The relief will last only a short time. You may then have a medial branch neurotomy at a later  visit to try to get longer relief.  It takes 20 to 30 minutes to get the block. You can go home after the doctor watches you for about an hour. You will get instructions on how to report how much pain you have when you are at home.  You will need someone to drive you home.  How is medial branch neurotomy done?  The doctor will use a tiny needle to numb the skin where you will get the neurotomy. Then he or she puts the neurotomy needle into the numbed area. You may feel some pressure. Using fluoroscopy (live X-ray) to guide the needle, the doctor sends radio waves through the needle to the nerve for 60 to 90 seconds. The radio waves heat the nerve, which damages it. The doctor may do this several times. And he or she may treat more than one nerve.  It takes 45 to 90 minutes to get a neurotomy, depending on how many nerves are heated. You will probably go home 30 to 60 minutes later.  You will need someone to drive you home.  What can you expect after a neurotomy?  You may feel a little sore or tender at the injection site at first. But after a successful neurotomy, most people have pain relief right away. It often lasts for 9 to 12 months or longer. Sometimes the pain relief is permanent.  If your  pain does come back, it may mean that the damaged nerve has healed and can send pain messages again. Or it can mean that a different nerve is causing pain. Your doctor will discuss your options with you.  Follow-up care is a key part of your treatment and safety. Be sure to make and go to all appointments, and call your doctor if you are having problems. It's also a good idea to know your test results and keep a list of the medicines you take.   Where can you learn more?   Go to [email or web address]  Enter T494 in the search box to learn more about "Learning About Medial Branch Block and Neurotomy."   ?? 2006-2015 [employer]wise[employer]. Care instructions adapted under  license by [employer] (which disclaims liability or warranty for this information). This care instruction is for use with your licensed healthcare professional. If you have questions about a medical condition or this instruction, always ask your healthcare professional. Healthwise, Incorporated disclaims any warranty or liability for your use of this information.  Content Version: 10.7.482551; Current as of: September 26, 2013

## 2014-09-03 NOTE — Progress Notes (Signed)
HISTORY OF PRESENT ILLNESS  Aaron Reid is a 59 y.o. male.  He returns for followup of chronic, severe pain involving the cervical and lumbar spine as well as the left knee. He suffers from post cervical post lumbar laminectomy pain as well as generalized osteoarthritis.   He presents today to discuss the multiple imaging studies ordered at his last visit. Knee xrays revealed only mild to moderate arthritis on the left side. He reports that left knee pain has actually improved since his last visit, to which he credits reducing physical activity at his job as well as using Mobic.   CT of the lumbar spine revealed worsening of degenerative disc disease, particularly at L2/L3, with moderate central canal stenosis (6-9 mm) due to ligamentum hypertrophy and disc bulge. Postsurgical changes from L3 through S1 appear stable and hardware intact. We spent a great deal of time discussing his options, including ESIs, RFA, SCS, and neurosurgery. He wishes to research all of his options before making a decision. Back pain is described as stiff, stabbing, and aching. The pain often radiates into the left buttock, posterior thigh, and into the distal calf.  There are associated muscle spasms and cramps in the lower back. These symptoms tend to worsen at night and in the mornings. He denies saddle anesthesia or bowel/bladder dysfunction, but does carry residual symptoms of neurogenic bladder since his surgery.  Medications continue to work well for pain control overall. he is tolerating his medications well, with no untoward side effects noted. The patient is able to stay more active with less discomfort with these current doses. The patient reports an average of 60% relief with this regimen.      He denies new or worsening insomnia or constipation issues. He denies any falls, injuries, or hospitalizations since the last visit.   A total of 40 minutes was spent with the patient of which more than 50% of  the time was spent counseling the patient.   HPI--see above    ROS  Constitutional: Positive for malaise/fatigue.   Gastrointestinal: Positive for heartburn, nausea and constipation.   Musculoskeletal: Positive for myalgias, back pain, joint pain (polyarticular) and neck pain.   Neurological: Positive for weakness (generalized).   Psychiatric/Behavioral: The patient has insomnia.    All other systems reviewed and are negative.  Physical Exam  Constitutional: He is oriented to person, place, and time. He appears well-developed and well-nourished.   In visible discomfort     HENT:   Head: Normocephalic and atraumatic.   Eyes: EOM are normal.   Musculoskeletal:        Left knee: He exhibits decreased range of motion and bony tenderness. He exhibits no effusion, no ecchymosis, normal alignment, no LCL laxity, normal patellar mobility, normal meniscus and no MCL laxity. Tenderness found. Medial joint line tenderness noted.        Lumbar back: He exhibits decreased range of motion, tenderness, pain and spasm.        Back:         Legs:  Marked spasms of lumbar paraspinous musculature noted  Crepitus of bilateral knees with passive ROM   No joint laxity noted  No effusion     Neurological: He is alert and oriented to person, place, and time. He displays no atrophy and normal reflexes. No cranial nerve deficit or sensory deficit. He exhibits normal muscle tone. Gait (due to pain) abnormal. Coordination normal.   Positive bilateral SLR at 60 degrees  5/5 muscle strength of  bilateral lower extremities   Psychiatric: He has a normal mood and affect. His behavior is normal. Judgment and thought content normal.   ASSESSMENT and PLAN    ICD-10-CM ICD-9-CM    1. Spondylosis of lumbar region without myelopathy or radiculopathy M47.816 721.3    2. Encounter for long-term (current) use of high-risk medication Z79.899 V58.69    3. Neurogenic bladder N31.9 596.54    4. Uncomplicated opioid dependence (HCC) F11.20 304.00     5. S/P lumbar spinal fusion Z98.1 V45.4    6. DDD (degenerative disc disease), lumbar M51.36 722.52    7. Neck pain M54.2 723.1    8. DDD (degenerative disc disease), cervical M50.30 722.4       Plan:  Continue same medications as prescribed for chronic pain  Consider radiofrequency ablation, epidural steroid injections, spinal cord stimulator therapy, and or lumbar surgery (if all else fails) to address severe back pain  Follow up in 2-3 months or sooner if needed  Regular exercise and attention to emotional health and diet remain the most effective ways to treat chronic pain of all kinds  You may contact me with questions or concerns through MyChart

## 2014-09-03 NOTE — Progress Notes (Signed)
Nursing Notes    Patient presents to the office today in follow-up.   Patient rates his pain at 7/10 on the numerical pain scale.          Comments: Pt did not bring meds to visit was counseled about policy. PMP shows last fill date 07/22/14 for Suboxone.     POC UDS NOT performed in office today    Any new labs or imaging since last appointment?Xray     Have you been to an emergency room (ER) or urgent care clinic since your last visit? no          Have you been hospitalized since your last visit?no     If yes, where, when, and reason for visit?     Have you seen or consulted any other health care providers outside of the [facility]  since your last visit?  no  If yes, where, when, and reason for visit?   Amanda PeaBrandy L Adina Puzzo LPN

## 2014-09-11 LAB — METABOLIC PANEL, COMPREHENSIVE
A-G Ratio: 1.1 (ref 1.1–2.5)
ALT (SGPT): 35 IU/L (ref 0–44)
AST (SGOT): 34 IU/L (ref 0–40)
Albumin: 3.9 g/dL (ref 3.6–4.8)
Alk. phosphatase: 59 IU/L (ref 39–117)
BUN/Creatinine ratio: 18 (ref 10–22)
BUN: 16 mg/dL (ref 8–27)
Bilirubin, total: 0.6 mg/dL (ref 0.0–1.2)
CO2: 27 mmol/L (ref 18–29)
Calcium: 9.1 mg/dL (ref 8.6–10.2)
Chloride: 104 mmol/L (ref 97–108)
Creatinine: 0.88 mg/dL (ref 0.76–1.27)
GFR est AA: 108 mL/min/{1.73_m2} (ref >59)
GFR est non-AA: 93 mL/min/{1.73_m2} (ref >59)
GLOBULIN, TOTAL: 3.6 g/dL (ref 1.5–4.5)
Glucose: 95 mg/dL (ref 65–99)
Potassium: 4.5 mmol/L (ref 3.5–5.2)
Protein, total: 7.5 g/dL (ref 6.0–8.5)
Sodium: 146 mmol/L — ABNORMAL HIGH (ref 134–144)

## 2014-09-11 LAB — CBC WITH AUTOMATED DIFF
ABS. BASOPHILS: 0 10*3/uL (ref 0.0–0.2)
ABS. EOSINOPHILS: 0.1 10*3/uL (ref 0.0–0.4)
ABS. IMM. GRANS.: 0 10*3/uL (ref 0.0–0.1)
ABS. MONOCYTES: 0.4 10*3/uL (ref 0.1–0.9)
ABS. NEUTROPHILS: 1.8 10*3/uL (ref 1.4–7.0)
Abs Lymphocytes: 2.5 10*3/uL (ref 0.7–3.1)
BASOPHILS: 0 %
EOSINOPHILS: 1 %
HCT: 41.4 % (ref 37.5–51.0)
HGB: 13.8 g/dL (ref 12.6–17.7)
IMMATURE GRANULOCYTES: 0 %
Lymphocytes: 52 %
MCH: 29.7 pg (ref 26.6–33.0)
MCHC: 33.3 g/dL (ref 31.5–35.7)
MCV: 89 fL (ref 79–97)
MONOCYTES: 9 %
NEUTROPHILS: 38 %
PLATELET: 145 10*3/uL — ABNORMAL LOW (ref 150–379)
RBC: 4.65 x10E6/uL (ref 4.14–5.80)
RDW: 13.6 % (ref 12.3–15.4)
WBC: 4.8 10*3/uL (ref 3.4–10.8)

## 2014-09-11 LAB — LIPID PANEL
Cholesterol, total: 139 mg/dL (ref 100–199)
HDL Cholesterol: 55 mg/dL (ref >39)
LDL, calculated: 67 mg/dL (ref 0–99)
Triglyceride: 87 mg/dL (ref 0–149)
VLDL, calculated: 17 mg/dL (ref 5–40)

## 2014-09-11 LAB — TESTOSTERONE, TOTAL, ADULT MALE: Testosterone: 554 ng/dL (ref 348–1197)

## 2014-09-11 LAB — T4, FREE: T4, Free: 1.33 ng/dL (ref 0.82–1.77)

## 2014-09-11 LAB — CVD REPORT

## 2014-09-11 LAB — TSH 3RD GENERATION: TSH: 0.904 u[IU]/mL (ref 0.450–4.500)

## 2014-09-11 NOTE — Telephone Encounter (Signed)
Prior auth forms for naloxegol (MOVANTIK) 25 mg tab tablet completed and signed by Dr. Doristine Churchsalsano and faxed to [phone].  Fax confirmation received.

## 2014-09-14 MED ORDER — LUBIPROSTONE 24 MCG CAP
24 mcg | ORAL_CAPSULE | Freq: Two times a day (BID) | ORAL | Status: AC
Start: 2014-09-14 — End: 2014-10-14

## 2014-09-14 NOTE — Telephone Encounter (Signed)
Prior auth denied, patient has not tried Linzess or Amitiza. Do you want to send one in for him?

## 2014-09-14 NOTE — Telephone Encounter (Signed)
E-scribe amitiza 25 mcg bid # 60

## 2014-09-14 NOTE — Telephone Encounter (Signed)
Yes whichever is preferred

## 2014-09-14 NOTE — Telephone Encounter (Signed)
Either of those will require a prior auth as well.

## 2014-09-17 NOTE — Telephone Encounter (Signed)
Suboxone was approved by [facility] from [facility]/16/[facility]. Faxed to pharmacy.

## 2014-10-05 ENCOUNTER — Ambulatory Visit: Admit: 2014-10-05 | Discharge: 2014-10-05 | Payer: MEDICARE | Attending: Urology | Primary: Internal Medicine

## 2014-10-05 DIAGNOSIS — N400 Enlarged prostate without lower urinary tract symptoms: Secondary | ICD-10-CM

## 2014-10-05 LAB — AMB POC URINALYSIS DIP STICK AUTO W/O MICRO
Blood (UA POC): NEGATIVE
Glucose (UA POC): NEGATIVE
Leukocyte esterase (UA POC): NEGATIVE
Nitrites (UA POC): NEGATIVE
Protein (UA POC): NEGATIVE mg/dL
Specific gravity (UA POC): 1.03 (ref 1.001–1.035)
Urobilinogen (UA POC): 0.2 (ref 0.2–1)
pH (UA POC): 5 (ref 4.6–8.0)

## 2014-10-05 LAB — AMB POC PVR, MEAS,POST-VOID RES,US,NON-IMAGING: PVR: 346 cc

## 2014-10-05 MED ORDER — TAMSULOSIN SR 0.4 MG 24 HR CAP
0.4 mg | ORAL_CAPSULE | Freq: Every day | ORAL | Status: DC
Start: 2014-10-05 — End: 2015-04-25

## 2014-10-05 NOTE — Progress Notes (Signed)
Aaron Reid  Dec 28, 1955    Assessment:   1. Moderate irritative LUTS, specifically nocturia, frequency, and urgency with UUI. ?history of neurogenic bladder. Not currently on GU medical therapy. Favor further evaluation with UDS to assess bladder function. Will initiate alpha blocker in the interim. Patient agrees with plan.   2. Incomplete bladder emptying, PVR 346cc today   3. Screening PSA, 0.7ng/ml on 07/18/13, wnl   4. PMH notable for; Type II Diabetes controlled with diet and exercise. S/p cervical and lumbar laminectomy in the past       Plan:   1. UDS  2. Begin flomax 0.4mg  daily, prescription provided today. SEs and dosing reviewed.   3. Literature provided on bladder irritants   4. F/u in 8 weeks to rv UDS, reassess urinary symptoms on medical therapy     Discussion:  We discussed the options for management of LUTS, including watchful waiting, over the counter supplements, medical therapy, minimally invasive therapies, laser therapy and transurethral resection/coagulative therapies. The risks and benefits of each option were discussed and the patent desires medical management.     Chief Complaint   Patient presents with   ??? Benign Prostatic Hypertrophy     Ref by Lissa Hoard   ??? (LUTS) Lower Urinary Tract Symptoms     pt states that he has a hard time not leaking on himself   ??? Neurogenic Bladder     History of Present Illness:  Aaron Reid is a 59 y.o. AA male referred by Dr. Doristine Church for the evaluation and management of LUTS. Previously followed by a urologist in [place] for a neurogenic bladder. Last UDS performed in 2012; results unavailable to me. Baseline urinary symptoms include; nocturia x4 (worsened with fluid intake prior to bed), daytime urinary frequency (also dependent on fluid intake), urinary urgency with UUI. He reports a strong force of stream and feels as though he completely empties his bladder. However, PVR 346cc today. He is not currently on GU medical therapy.  Denies gross hematuria, dysuria, urinary hesitancy, straining to void. No history of AUR. He has never needed to perform CIC. The patient reports he drinks primarily juices and teas. PMH notable for; Type II Diabetes controlled with diet and exercise. S/p cervical and lumbar laminectomy in the past.    PSA /TESTOSTERONE - [facility] PSA   Latest Ref Rng 0.0 - 4.0 ng/mL   07/18/2013 0.7     AUA Symptom Score 10/05/2014   Over the past month how often have you had the sensation that your bladder was not completely empty after you finished urinating? 0   Over the past month, how often have had to urinate again less than 2 hours after you last finished urinating? 3   Over the past month, how often have you found you stopped and started again several times when you urinated? 0   Over the past month, how often have you found it difficult to postpone urination? 5   Over the past month, how often have you had a weak urinary stream? 0   Over the past month, how often have you had to push or strain to begin urinating? 0   Over the past month, how many times did you most typically get up to urinate from the time you went to bed at night until the time you got up in the morning? 4   AUA Score 12   If you were to spend the rest of your life with your urinary condition the  way it is now, how would you feel about that? Unhappy     Review of Systems  Constitutional: Fever: No  Skin: Rash: No  HEENT: Hearing difficulty: No  Eyes: Blurred vision: No  Cardiovascular: Chest pain: No  Respiratory: Shortness of breath: No  Gastrointestinal: Nausea/vomiting: No  Musculoskeletal: Back pain: No  Neurological: Weakness: No  Psychological: Memory loss: No  Comments/additional findings:   All other systems reviewed and are negative    Past Medical History   Diagnosis Date   ??? Cervical fusion syndrome 10/2003   ??? Chronic pain 1980     lower back    ??? LBP (low back pain) 12/24/2012   ??? DDD (degenerative disc disease), lumbar 12/24/2012    ??? S/P lumbar spinal fusion 12/24/2012   ??? Lumbar spondylosis 12/24/2012   ??? Neck pain 12/24/2012   ??? DDD (degenerative disc disease), cervical 12/24/2012   ??? S/P cervical spinal fusion 12/24/2012   ??? Spondylolisthesis of cervical region 12/24/2012   ??? Cervical spondylosis 12/24/2012   ??? Foraminal stenosis of cervical region 12/24/2012   ??? DJD (degenerative joint disease), multiple sites 12/24/2012   ??? Musculoskeletal pain 12/24/2012   ??? Spasmodic torticollis 12/24/2012   ??? GAD (generalized anxiety disorder) 12/24/2012   ??? Hepatitis C 12/24/2012   ??? Insomnia 12/24/2012   ??? Opioid dependence (HCC) 02/21/2013   ??? Cervical fusion syndrome 2005   ??? Chronic pain 1980     lower back   ??? Neurogenic bladder    ??? GERD (gastroesophageal reflux disease)    ??? Hepatitis C infection    ??? Back injury    ??? Neck injury    ??? Head injury    ??? Arthritis        Past Surgical History   Procedure Laterality Date   ??? Endoscopy, colon, diagnostic     ??? Hx orthopaedic       spinal fusion, lumbar, cervical   ??? Hx orthopaedic       spinal fusion, lumbar cervical       History   Substance Use Topics   ??? Smoking status: Never Smoker    ??? Smokeless tobacco: Never Used   ??? Alcohol Use: No       Allergies   Allergen Reactions   ??? Latex Rash   ??? Cymbalta [Duloxetine] Anxiety   ??? Duragesic [Fentanyl] Drowsiness     Altered LOC       Family History   Problem Relation Age of Onset   ??? Heart Disease Sister    ??? Diabetes Brother    ??? Stroke Brother        Current Outpatient Prescriptions   Medication Sig Dispense Refill   ??? buprenorphine-naloxone (SUBOXONE) 8-2 mg film sublingaul film by SubLINGual route daily.     ??? tamsulosin (FLOMAX) 0.4 mg capsule Take 1 Cap by mouth daily (after dinner). 30 Cap 5   ??? lubiPROStone (AMITIZA) 24 mcg capsule Take 1 Cap by mouth two (2) times daily (with meals) for 30 days. 60 Cap 0   ??? meloxicam (MOBIC) 7.5 mg tablet Take 1-2 Tabs by mouth daily. As needed for pain 60 Tab 2    ??? tiZANidine (ZANAFLEX) 2 mg tablet 1-2 every 6 hours as needed for pain 60 Tab 3   ??? LORazepam (ATIVAN) 0.5 mg tablet Take 1 Tab by mouth every eight (8) hours as needed for Anxiety. 30 Tab 1   ??? aspirin delayed-release 81 mg tablet Take  by  mouth daily.       Physical Exam:  BP 130/86 mmHg   Ht  (1.778 m)   Wt 234 lb (106.142 kg)   BMI 33.58 kg/m2  Constitutional: WDWN, Pleasant and appropriate affect, No acute distress.    CV:  No peripheral swelling noted.  Respiratory: No respiratory distress or difficulties.  Abdomen:  No abdominal masses or tenderness.  No CVA tenderness. No hernias noted.   Back: Well healed lower back midline scar  GU Male:    DRE: Patient declines. Recent DRE by Dr. Doristine Church per pt  SCROTUM:  No scrotal rash or lesions noticed.  Normal bilateral testes and epididymis.   PENIS: Urethral meatus normal in location and size. No urethral discharge.  Skin: No jaundice.    Neuro/Psych:  Alert and oriented x 3. Affect appropriate.   Lymphatic:   No enlarged inguinal lymph nodes.      Urinalysis shows:   Results for orders placed or performed in visit on 10/05/14   AMB POC PVR, MEAS,POST-VOID RES,US,NON-IMAGING   Result Value Ref Range    PVR 346 cc   AMB POC URINALYSIS DIP STICK AUTO W/O MICRO   Result Value Ref Range    Color (UA POC) Yellow     Clarity (UA POC) Clear     Glucose (UA POC) Negative Negative    Bilirubin (UA POC) 1+ Negative    Ketones (UA POC) Trace Negative    Specific gravity (UA POC) 1.030 1.001 - 1.035    Blood (UA POC) Negative Negative    pH (UA POC) 5.0 4.6 - 8.0    Protein (UA POC) Negative Negative mg/dL    Urobilinogen (UA POC) 0.2 mg/dL 0.2 - 1    Nitrites (UA POC) Negative Negative    Leukocyte esterase (UA POC) Negative Negative         ICD-10-CM ICD-9-CM    1. Benign prostatic hyperplasia, presence of lower urinary tract symptoms unspecified, unspecified morphology N40.0 600.00 AMB POC PVR, MEAS,POST-VOID RES,US,NON-IMAGING       buprenorphine-naloxone (SUBOXONE) 8-2 mg film sublingaul film      AMB POC URINALYSIS DIP STICK AUTO W/O MICRO      REFERRAL TO [facility]ICS ([facility])   2. Lower urinary tract symptoms (LUTS) R39.9 788.99 AMB POC PVR, MEAS,POST-VOID RES,US,NON-IMAGING      buprenorphine-naloxone (SUBOXONE) 8-2 mg film sublingaul film      AMB POC URINALYSIS DIP STICK AUTO W/O MICRO      REFERRAL TO [facility]NAMICS ([facility])   3. Incomplete bladder emptying R33.9 788.21 REFERRAL TO URODYNAMICS ([facility])   4. Urinary frequency R35.0 788.41 REFERRAL TO [facility]NAMICS ([facility])   5. Urinary urgency R39.15 788.63 REFERRAL TO [facility]NAMICS ([facility])   6. Urge incontinence N39.41 788.31 REFERRAL TO [facility]ICS ([facility])   7. History of neurogenic bladder Z87.448 V13.09 REFERRAL TO UR[facility]NAMICS ([facility])     Fortino Sic, MD  [facility]     Notes with recommendations sent to:  Donzetta Matters, MD    Medical Documentation is provided with the assistance of Frederich Cha, medical scribe for Arlyce Harman, MD.

## 2014-10-21 ENCOUNTER — Ambulatory Visit: Admit: 2014-10-21 | Discharge: 2014-10-21 | Payer: MEDICARE | Attending: Internal Medicine | Primary: Internal Medicine

## 2014-10-21 DIAGNOSIS — J019 Acute sinusitis, unspecified: Secondary | ICD-10-CM

## 2014-10-21 MED ORDER — AZITHROMYCIN 250 MG TAB
250 mg | ORAL_TABLET | ORAL | Status: DC
Start: 2014-10-21 — End: 2014-11-10

## 2014-10-21 NOTE — Progress Notes (Signed)
HISTORY OF PRESENT ILLNESS  Aaron Reid is a 59 y.o. male.  HPI  hbp stable  Chronic hepatitis stable  C/o sinusitis  Review of Systems   Musculoskeletal: Positive for back pain.   All other systems reviewed and are negative.    Past Medical History   Diagnosis Date   ??? Cervical fusion syndrome 10/2003   ??? Chronic pain 1980     lower back    ??? LBP (low back pain) 12/24/2012   ??? DDD (degenerative disc disease), lumbar 12/24/2012   ??? S/P lumbar spinal fusion 12/24/2012   ??? Lumbar spondylosis 12/24/2012   ??? Neck pain 12/24/2012   ??? DDD (degenerative disc disease), cervical 12/24/2012   ??? S/P cervical spinal fusion 12/24/2012   ??? Spondylolisthesis of cervical region 12/24/2012   ??? Cervical spondylosis 12/24/2012   ??? Foraminal stenosis of cervical region 12/24/2012   ??? DJD (degenerative joint disease), multiple sites 12/24/2012   ??? Musculoskeletal pain 12/24/2012   ??? Spasmodic torticollis 12/24/2012   ??? GAD (generalized anxiety disorder) 12/24/2012   ??? Hepatitis C 12/24/2012   ??? Insomnia 12/24/2012   ??? Opioid dependence (HCC) 02/21/2013   ??? Cervical fusion syndrome 2005   ??? Chronic pain 1980     lower back   ??? Neurogenic bladder    ??? GERD (gastroesophageal reflux disease)    ??? Hepatitis C infection    ??? Back injury    ??? Neck injury    ??? Head injury    ??? Arthritis        Current outpatient prescriptions:   ???  buprenorphine-naloxone (SUBOXONE) 8-2 mg film sublingaul film, by SubLINGual route daily., Disp: , Rfl:   ???  tamsulosin (FLOMAX) 0.4 mg capsule, Take 1 Cap by mouth daily (after dinner)., Disp: 30 Cap, Rfl: 5  ???  meloxicam (MOBIC) 7.5 mg tablet, Take 1-2 Tabs by mouth daily. As needed for pain, Disp: 60 Tab, Rfl: 2  ???  tiZANidine (ZANAFLEX) 2 mg tablet, 1-2 every 6 hours as needed for pain, Disp: 60 Tab, Rfl: 3  ???  LORazepam (ATIVAN) 0.5 mg tablet, Take 1 Tab by mouth every eight (8) hours as needed for Anxiety., Disp: 30 Tab, Rfl: 1  ???  aspirin delayed-release 81 mg tablet, Take  by mouth daily., Disp: , Rfl:    BP 141/89 mmHg   Pulse 73   Temp(Src) 98.3 ??F (36.8 ??C) (Oral)   Resp 20   Ht 5\' 10"  (1.778 m)   Wt 234 lb (106.142 kg)   BMI 33.58 kg/m2   SpO2 94%      Physical Exam   Constitutional: He appears well-developed and well-nourished. No distress.   Musculoskeletal: Normal range of motion. He exhibits no edema or tenderness.   Skin: He is not diaphoretic.   Vitals reviewed.    Reviewed labs  ASSESSMENT and PLAN  chronic hepatitis   Sinusitis  Plan  [id number]

## 2014-10-21 NOTE — Progress Notes (Signed)
Aaron Reid is a 59 y.o. male here today to discuss lab results. Patient c/o allergies, sinus pressure behind eyes, and back pain. Pain scale 6/10      1. Have you been to the ER, urgent care clinic since your last visit?  Hospitalized since your last visit?No    2. Have you seen or consulted any other health care providers outside of the [facility] since your last visit?  Include any pap smears or colon screening. No

## 2014-10-23 ENCOUNTER — Encounter: Primary: Internal Medicine

## 2014-11-10 ENCOUNTER — Ambulatory Visit: Admit: 2014-11-10 | Discharge: 2014-11-10 | Payer: MEDICARE | Attending: Pain Medicine | Primary: Internal Medicine

## 2014-11-10 ENCOUNTER — Ambulatory Visit: Attending: Pain Medicine | Primary: Internal Medicine

## 2014-11-10 DIAGNOSIS — M150 Primary generalized (osteo)arthritis: Secondary | ICD-10-CM

## 2014-11-10 DIAGNOSIS — M159 Polyosteoarthritis, unspecified: Secondary | ICD-10-CM

## 2014-11-10 MED ORDER — BUPRENORPHINE-NALOXONE 8 MG-2 MG SUBLINGUAL FILM
8-2 mg | ORAL_FILM | Freq: Two times a day (BID) | SUBLINGUAL | Status: DC
Start: 2014-11-10 — End: 2015-02-09

## 2014-11-10 NOTE — Progress Notes (Signed)
HISTORY OF PRESENT ILLNESS  Aaron Reid is a 59 y.o. male.  HPI Returns for followup of chronic, severe pain in the cervical and lumbar spine as well as the left knee. He suffers from a post cervical and post lumbar laminectomy pain syndrome as well as generalized osteoarthritis.    Pain continues to be under good control, averaging 4-5/10 with 70% overall relief.  Pain level today as 4/10, outcome 6/28,(The lower the upper number, the better the outcome)  Physical activity and mobility were very good, mood and sleep are good.  No reported side effects.    Review of Systems   Constitutional: Positive for malaise/fatigue.   Gastrointestinal: Positive for heartburn, nausea and constipation.   Musculoskeletal: Positive for myalgias, back pain, joint pain (polyarticular) and neck pain.   Neurological: Positive for weakness (generalized).   Psychiatric/Behavioral: The patient has insomnia.    All other systems reviewed and are negative.      Physical Exam   Constitutional: He is oriented to person, place, and time. He appears distressed.   HENT:   Head: Normocephalic and atraumatic.   Right Ear: External ear normal.   Left Ear: External ear normal.   Nose: Nose normal.   Mouth/Throat: Oropharynx is clear and moist. No oropharyngeal exudate.   Eyes: Conjunctivae and EOM are normal. Pupils are equal, round, and reactive to light. Right eye exhibits no discharge. Left eye exhibits no discharge. No scleral icterus.   Neck: Spinous process tenderness and muscular tenderness (spasms) present. Decreased range of motion present. No thyromegaly present.   Cardiovascular: Normal rate, regular rhythm and normal heart sounds.    Pulmonary/Chest: Effort normal and breath sounds normal. No respiratory distress. He has no wheezes. He has no rales.   Abdominal: Soft. He exhibits no distension. There is no tenderness. There is no rebound and no guarding.   Musculoskeletal: He exhibits tenderness.         Right shoulder: He exhibits decreased range of motion, tenderness and pain.        Left shoulder: He exhibits decreased range of motion, tenderness and pain.        Right elbow: He exhibits decreased range of motion. Tenderness (diffuse) found.        Left elbow: He exhibits decreased range of motion. Tenderness (crepitus) found.        Right wrist: He exhibits decreased range of motion, tenderness, bony tenderness and crepitus.        Left wrist: He exhibits decreased range of motion, tenderness, bony tenderness and crepitus.        Right knee: He exhibits decreased range of motion (crepitus) and bony tenderness.        Left knee: He exhibits decreased range of motion (crepitus) and bony tenderness.        Right ankle: He exhibits decreased range of motion. Tenderness.        Left ankle: He exhibits decreased range of motion. Tenderness.        Lumbar back: He exhibits decreased range of motion, tenderness, pain and spasm.   Neurological: He is alert and oriented to person, place, and time. He has normal reflexes. No cranial nerve deficit or sensory deficit. He exhibits normal muscle tone. Gait abnormal. Coordination normal.   Reflex Scores:       Tricep reflexes are 2+ on the right side and 2+ on the left side.       Bicep reflexes are 2+ on the right side and 2+  on the left side.       Brachioradialis reflexes are 2+ on the right side and 2+ on the left side.       Patellar reflexes are 2+ on the right side and 2+ on the left side.       Achilles reflexes are 2+ on the right side and 2+ on the left side.  Skin: Skin is warm. No rash noted.   Psychiatric: He has a normal mood and affect. His behavior is normal. Judgment and thought content normal.   Nursing note and vitals reviewed.      ASSESSMENT and PLAN  Encounter Diagnoses   Name Primary?   ??? Primary osteoarthritis involving multiple joints Yes   ??? Chronic pain syndrome    ??? DDD (degenerative disc disease), lumbar     ??? Spondylosis of lumbar region without myelopathy or radiculopathy    ??? Neck pain    ??? DDD (degenerative disc disease), cervical    ??? Spondylolisthesis of cervical region    ??? Spondylosis of cervical region without myelopathy or radiculopathy    ??? Encounter for long-term (current) use of high-risk medication      He will continue on his current analgesic regimen as this is providing excellent pain control improved functionality and minimal side effects.  3 months reassess    No concerns are raised for misuse, abuse, or diversion.    1. Pain medications are prescribed with the objective of pain relief and improved physical and psychosocial function in this patient.  2. Counseled patient on proper use of prescribed medications and reviewed opioid contract.  3. Counseled patient about chronic medical conditions and their relationship to anxiety and depression and recommended mental health support as needed.  4. Reviewed with patient self-help tools, home exercise, and lifestyle changes to assist the patient in self-management of symptoms.  5. Advised patient to have a primary care provider to continue care for health maintenance and general medical conditions and support for referral to specialty care as needed.  6. Reviewed with patient the treatment plan, goals of treatment plan, and limitations of treatment plan, to include the potential for side effects from medications and procedures. If side effects occur, it is the responsibility of the patient to inform the clinic so that a change in the treatment plan can be made in a safe manner. The patient is advised that stopping prescribed medication may cause an increase in symptoms and possible medication withdrawal symptoms. The patient is informed an emergency room evaluation may be necessary if this occurs.  DISPOSITION: The patient???s condition and plan were discussed at length and all questions were answered. The patient agrees with the plan.     Counseling occupied > 50% of visit:  Total time: 30 minutes

## 2014-11-10 NOTE — Progress Notes (Signed)
Nursing Notes    Patient presents to the office today in follow-up.   Patient rates his pain at 4/10 on the numerical pain scale.     Reviewed medications with counts as follows:    Rx Date filled Qty Dispensed Pill Count Last Dose Short   suboxone 8 09/15/14 60 4 11/10/14 no                                 Comments:     POC UDS was not performed in office today    Any new labs or imaging since last appointment? YES, physical blood work with EKG and chest xray, everything normal    Have you been to an emergency room (ER) or urgent care clinic since your last visit?  NO            Have you been hospitalized since your last visit? NO     If yes, where, when, and reason for visit?     Have you seen or consulted any other health care providers outside of the [facility]  since your last visit?  YES     If yes, where, when, and reason for visit? PCP for physical    Aaron Reid has a reminder for a "due or due soon" health maintenance. I have asked that he contact his primary care provider for follow-up on this health maintenance.

## 2014-11-10 NOTE — Patient Instructions (Signed)
Current health maintenance issues were reviewed and the patient was advised to followup with his/her PCP for completion of these items.

## 2014-11-13 ENCOUNTER — Encounter: Primary: Internal Medicine

## 2014-12-02 ENCOUNTER — Encounter: Attending: Urology | Primary: Internal Medicine

## 2015-01-12 ENCOUNTER — Ambulatory Visit: Admit: 2015-01-12 | Payer: MEDICARE | Primary: Internal Medicine

## 2015-01-12 DIAGNOSIS — N401 Enlarged prostate with lower urinary tract symptoms: Secondary | ICD-10-CM

## 2015-01-12 NOTE — Progress Notes (Addendum)
[facility]  Without Video- Fluoroscopy    Name: Aaron Reid   Date: 01/12/2015      Aaron Reid is a 59 y.o. male who presents today for evaluation. Patient has been referred by Dr. Tye [place].  Dr. Nada Boozer is available in clinic as the incident to provider.        ICD-10-CM ICD-9-CM    1. Benign non-nodular prostatic hyperplasia with lower urinary tract symptoms [N40.1] N40.1 600.91 PR ELECTRO-UROFLOWMETRY, FIRST      PR ANAL/URINARY MUSCLE STUDY      PR VOIDING PRESS STUDY INTRA-ABDOMINAL VOID      PR COMPLEX CYSTOMETROGRAM VOIDING PRESSURE STUDIES   2. Urinary frequency R35.0 788.41 PR ELECTRO-UROFLOWMETRY, FIRST      PR ANAL/URINARY MUSCLE STUDY      PR VOIDING PRESS STUDY INTRA-ABDOMINAL VOID      PR COMPLEX CYSTOMETROGRAM VOIDING PRESSURE STUDIES   3. Nocturia R35.1 788.43 PR ELECTRO-UROFLOWMETRY, FIRST      PR ANAL/URINARY MUSCLE STUDY      PR VOIDING PRESS STUDY INTRA-ABDOMINAL VOID      PR COMPLEX CYSTOMETROGRAM VOIDING PRESSURE STUDIES     Orders Placed This Encounter   ??? ([id number]) - PR ELECTRO-UROFLOWMETRY, FIRST   ??? ([id number]) - PR ANAL/URINARY MUSCLE STUDY   ??? [phone] - PR VOIDING PRESS STUDY INTRA-ABDOMINAL VOID   ??? ([id number]) - COMPLEX CYSTOMETROGRAM VOIDING PRESSURE STUDIES       Past Medical History   Diagnosis Date   ??? Cervical fusion syndrome 10/2003   ??? Chronic pain 1980     lower back    ??? LBP (low back pain) 12/24/2012   ??? DDD (degenerative disc disease), lumbar 12/24/2012   ??? S/P lumbar spinal fusion 12/24/2012   ??? Lumbar spondylosis 12/24/2012   ??? Neck pain 12/24/2012   ??? DDD (degenerative disc disease), cervical 12/24/2012   ??? S/P cervical spinal fusion 12/24/2012   ??? Spondylolisthesis of cervical region 12/24/2012   ??? Cervical spondylosis 12/24/2012   ??? Foraminal stenosis of cervical region 12/24/2012   ??? DJD (degenerative joint disease), multiple sites 12/24/2012   ??? Musculoskeletal pain 12/24/2012   ??? Spasmodic torticollis 12/24/2012    ??? GAD (generalized anxiety disorder) 12/24/2012   ??? Hepatitis C 12/24/2012   ??? Insomnia 12/24/2012   ??? Opioid dependence (HCC) 02/21/2013   ??? Cervical fusion syndrome 2005   ??? Chronic pain 1980     lower back   ??? Neurogenic bladder    ??? GERD (gastroesophageal reflux disease)    ??? Hepatitis C infection    ??? Back injury    ??? Neck injury    ??? Head injury    ??? Arthritis    ??? Benign prostatic hyperplasia without lower urinary tract symptoms    ??? Lower urinary tract symptoms (LUTS)    ??? Incomplete bladder emptying    ??? Urinary frequency    ??? Urinary urgency    ??? Urge incontinence    ??? History of neurogenic bladder       Past Surgical History   Procedure Laterality Date   ??? Endoscopy, colon, diagnostic     ??? Hx orthopaedic       spinal fusion, lumbar, cervical   ??? Hx orthopaedic       spinal fusion, lumbar cervical           Upon arrival, the patient emptied His bladder using a calibrated uroflow machine to assess bladder volume and time of emptying.  The genital and perineal areas were cleansed with benzalkonium chloride and patient was catheterized with a 14 fr Coude catheter to assess PVR and absence of urinary tract infection.     A #7 fr Coude  urodynamics catheter was inserted into the bladder to infuse fluid and assess bladder activity and voiding parameters.  A #9 fr catheter was inserted into the rectum to assess straining during voiding. Both catheters were connected to water filled lines to transducers that simultaneously measure pressure generated inside and outside the bladder during filling and voiding as well as resulting flow volume and time.      Three patch electrodes are placed, one ground and two perianally which are used to measure the electrical activity of the external urinary sphincter during filling and emptying the bladder.      Infusion with sterile water was started and the patient was asked to voice sensations of pressure, urge and bladder fullness, followed by instructions to void.              Orders Placed This Encounter   ??? ([id number]) - PR ELECTRO-UROFLOWMETRY, FIRST   ??? ([id number]) - PR ANAL/URINARY MUSCLE STUDY   ??? ([id number]) - PR VOIDING PRESS STUDY INTRA-ABDOMINAL VOID   ??? ([id number]) - COMPLEX CYSTOMETROGRAM VOIDING PRESSURE STUDIES           Urine dipstick shows negative for all components.    Voiding Diary: 1 day    Intake:  Volume: 1500 ml   Type:  Coffee, Tea, Water and Juice     Output:  Voided Volume: 925 ml/24 hr Range: 75-225 ml Mean: 132   Frequency: X 7/24 hr   Leaking Episodes: no     Uroflometry  Voided Volume 60.8 ml   Maximum Flow Rate 4.9 ml/sec   Average Flow Rate 2.9 ml/sec   Voiding Time 32.9 ml/sec   Flow Pattern intermittent   Post Void Residual 30 ml- cath   Comments: normal void per patient     Cystometrogram  Sensation delayed   First sensation 75 ml   Strong desire 88 ml   Capacity 94 ml   Compliance: normal     Detrusor overactivity:yes  near capacity    Leakage: no     Micturition  Voided Volume: 104 ml   Detrusor Pressure: At peak flow rate: 106 cm H2O Maximum: 110 cm H2O   Duration Contraction: > 1 minute  continuous    Post Void Contraction: no    Valsalva: No, slight abdominal relaxation   External Sphincter: relaxed   Max Flow Rate: 7.8 ml/sec   Average Flow Rate 4.2 ml/sec   Voiding Time: 25.4 seconds   Flow Pattern: continuous   Post Void Residual: n/a   Comments: UDS with patient on Flomax. He has a small capacity bladder with delayed sensations. He had DO near capacity without leakage. He voided with a high pressure detrusor contraction, low flow, slight abdominal relaxation, relaxed EUS and emptied well. His pressure-flow parameters are in the markedly obstructive range.       The Patient Instruction Sheet was reviewed with and given to patient who expressed understanding.    Aaron Reid A Aaron Narvaiz,RN      Follow-up Disposition:  Return in about 1 week (around 01/19/2015).     UDS tracings reviewed  Agree with above the patient is obstructed on UDS  Jamey Reas, MD

## 2015-01-20 ENCOUNTER — Ambulatory Visit: Admit: 2015-01-20 | Discharge: 2015-01-20 | Payer: MEDICARE | Attending: Urology | Primary: Internal Medicine

## 2015-01-20 DIAGNOSIS — N401 Enlarged prostate with lower urinary tract symptoms: Secondary | ICD-10-CM

## 2015-01-20 LAB — AMB POC URINALYSIS DIP STICK AUTO W/O MICRO
Bilirubin (UA POC): NEGATIVE
Blood (UA POC): NEGATIVE
Glucose (UA POC): NEGATIVE
Ketones (UA POC): NEGATIVE
Leukocyte esterase (UA POC): NEGATIVE
Nitrites (UA POC): NEGATIVE
Protein (UA POC): NEGATIVE mg/dL
Specific gravity (UA POC): 1.03 (ref 1.001–1.035)
Urobilinogen (UA POC): 0.2 (ref 0.2–1)
pH (UA POC): 5.5 (ref 4.6–8.0)

## 2015-01-20 LAB — AMB POC PVR, MEAS,POST-VOID RES,US,NON-IMAGING: PVR: 0 cc

## 2015-01-20 NOTE — Progress Notes (Signed)
Aaron Reid  60-Nov-1957    Assessment:   1. Moderate irritative LUTS, specifically nocturia, frequency, and urgency with UUI. Initiated on flomax 0.4mg  daily with worsening symptoms subjectively. UDS revealed a small capacity bladder with delayed sensations. He had DO near capacity without leakage. He voided with a high pressure detrusor contraction, low flow, slight abdominal relaxation, relaxed EUS and emptied well. His pressure-flow parameters are in the markedly obstructive range. Management options discussed including indications, r/b with the patient. Patient likely to benefit from outlet procedure. Will complete BPH screening prior to definitive management with TRUS prostate volume and in office cystoscopy to assess lower tract anatomy. Patient agrees with plan. All questions answered.   2. Incomplete bladder emptying, improved on alpha blocker. PVR 346cc on 10/05/14, now 0cc today.   3. Screening PSA, 0.7ng/ml on 07/18/13, wnl   4. PMH notable for; Type II Diabetes controlled with diet and exercise. S/p cervical and lumbar laminectomy in the past       Plan:   1. Reviewed UDS with the patient, outlined below  2. Literature provided on natural strategies for prostate cancer prevention   3. F/u with Dr. Tresa Endo for next available cystoscopy, TRUS prostate volume, discuss outlet procedure    Discussion:  We discussed the options for management of Benign Prostatic Hyperplasia, including watchful waiting, over the counter supplements, medical therapy, minimally invasive therapies, urolift, laser therapy and transurethral resection/coagulative therapies. The risks and benefits of each option were discussed and the patent desires management.     Chief Complaint   Patient presents with   ??? (LUTS) Lower Urinary Tract Symptoms     History of Present Illness:  Aaron Reid is a 59 y.o. AA male who presents today in follow up for LUTS and to rv recent UDS. The patient has previously been followed by  urology in [facility] for a neurogenic bladder with prior UDS performed in 2012; results unavailable to me. His baseline urinary symptoms include; nocturia x4 (worsened with fluid intake prior to bed), daytime urinary frequency (also dependent on fluid intake), urinary urgency with UUI. He reports a good force of stream and feels as though he completely empties his bladder. However, PVR was 346cc on 10/05/14. He was initiated on flomax 0.4mg  daily following last appt and reports no benefit from the medication subjectively. He actually endorses worsening symptoms since start of flomax. He experienced an episode of urinary urge incontinence and has had increased daytime frequency/nocturia. He is now emptying completely however with PVR of 0cc. UDS to further evaluate bladder function revealed a small capacity bladder with delayed sensations. He had DO near capacity without leakage. He voided with a high pressure detrusor contraction, low flow, slight abdominal relaxation, relaxed EUS and emptied well. His pressure-flow parameters are i n the markedly obstructive range. Denies gross hematuria, dysuria, urinary hesitancy, weak stream, straining to void. No history of AUR. He has never needed to perform CIC. The patient reports he drinks primarily juices and teas.Literature provided on bladder irritants previously. PMH notable for; Type II Diabetes controlled with diet and exercise. S/p cervical and lumbar laminectomy in the past.    PSA /TESTOSTERONE - [facility] PSA   Latest Ref Rng 0.0 - 4.0 ng/mL   07/18/2013 0.7     UDS 01/12/15 - report reviewed:  Micturition  Voided Volume: 104 ml   Detrusor Pressure: At peak flow rate: 106 cm H2O Maximum: 110 cm H2O   Duration Contraction: > 1 minute  continuous  Post Void Contraction: no    Valsalva: No, slight abdominal relaxation   External Sphincter: relaxed   Max Flow Rate: 7.8 ml/sec   Average Flow Rate 4.2 ml/sec   Voiding Time: 25.4 seconds   Flow Pattern: continuous    Post Void Residual: n/a   Comments: UDS with patient on Flomax. He has a small capacity bladder with delayed sensations. He had DO near capacity without leakage. He voided with a high pressure detrusor contraction, low flow, slight abdominal relaxation, relaxed EUS and emptied well. His pressure-flow parameters are in the markedly obstructive range.       AUA Symptom Score 01/20/2015   Over the past month how often have you had the sensation that your bladder was not completely empty after you finished urinating? 0   Over the past month, how often have had to urinate again less than 2 hours after you last finished urinating? 3   Over the past month, how often have you found you stopped and started again several times when you urinated? 0   Over the past month, how often have you found it difficult to postpone urination? 3   Over the past month, how often have you had a weak urinary stream? 0   Over the past month, how often have you had to push or strain to begin urinating? 0   Over the past month, how many times did you most typically get up to urinate from the time you went to bed at night until the time you got up in the morning? 3   AUA Score 9   If you were to spend the rest of your life with your urinary condition the way it is now, how would you feel about that? Mostly dissatisfied     Past Medical History   Diagnosis Date   ??? Arthritis    ??? Back injury    ??? Benign prostatic hyperplasia without lower urinary tract symptoms    ??? Cervical fusion syndrome 10/2003   ??? Cervical fusion syndrome 2005   ??? Cervical spondylosis 12/24/2012   ??? Chronic pain 1980     lower back    ??? Chronic pain 1980     lower back   ??? DDD (degenerative disc disease), cervical 12/24/2012   ??? DDD (degenerative disc disease), lumbar 12/24/2012   ??? DJD (degenerative joint disease), multiple sites 12/24/2012   ??? Foraminal stenosis of cervical region 12/24/2012   ??? GAD (generalized anxiety disorder) 12/24/2012    ??? GERD (gastroesophageal reflux disease)    ??? Head injury    ??? Hepatitis C 12/24/2012   ??? Hepatitis C infection    ??? History of neurogenic bladder    ??? Incomplete bladder emptying    ??? Insomnia 12/24/2012   ??? LBP (low back pain) 12/24/2012   ??? Lower urinary tract symptoms (LUTS)    ??? Lumbar spondylosis 12/24/2012   ??? Musculoskeletal pain 12/24/2012   ??? Neck injury    ??? Neck pain 12/24/2012   ??? Neurogenic bladder    ??? Opioid dependence (HCC) 02/21/2013   ??? S/P cervical spinal fusion 12/24/2012   ??? S/P lumbar spinal fusion 12/24/2012   ??? Spasmodic torticollis 12/24/2012   ??? Spondylolisthesis of cervical region 12/24/2012   ??? Urge incontinence    ??? Urinary frequency    ??? Urinary urgency        Past Surgical History   Procedure Laterality Date   ??? Endoscopy, colon, diagnostic     ???  Hx orthopaedic       spinal fusion, lumbar, cervical   ??? Hx orthopaedic       spinal fusion, lumbar cervical       Social History   Substance Use Topics   ??? Smoking status: Never Smoker   ??? Smokeless tobacco: Never Used   ??? Alcohol use No       Allergies   Allergen Reactions   ??? Latex Rash   ??? Cymbalta [Duloxetine] Anxiety   ??? Duragesic [Fentanyl] Drowsiness     Altered LOC       Family History   Problem Relation Age of Onset   ??? Heart Disease Sister    ??? Diabetes Brother    ??? Stroke Brother        Current Outpatient Prescriptions   Medication Sig Dispense Refill   ??? buprenorphine-naloxone (SUBOXONE) 8-2 mg film sublingaul film by SubLINGual route daily.     ??? tamsulosin (FLOMAX) 0.4 mg capsule Take 1 Cap by mouth daily (after dinner). 30 Cap 5   ??? aspirin delayed-release 81 mg tablet Take  by mouth daily.     ??? meloxicam (MOBIC) 7.5 mg tablet Take 1-2 Tabs by mouth daily. As needed for pain 60 Tab 2   ??? tiZANidine (ZANAFLEX) 2 mg tablet 1-2 every 6 hours as needed for pain 60 Tab 3   ??? LORazepam (ATIVAN) 0.5 mg tablet Take 1 Tab by mouth every eight (8) hours as needed for Anxiety. 30 Tab 1     Review of Systems  Constitutional: Fever: No   Skin: Rash: No  HEENT: Hearing difficulty: No  Eyes: Blurred vision: No  Cardiovascular: Chest pain: No  Respiratory: Shortness of breath: No  Gastrointestinal: Nausea/vomiting:   Musculoskeletal: Back pain: Yes  Neurological: Weakness: No  Psychological: Memory loss: No  Comments/additional findings:   All other systems reviewed and are negative    Physical Exam:  Visit Vitals   ??? BP 120/80   ??? Ht 5\' 10"  (1.778 m)   ??? Wt 234 lb (106.1 kg)   ??? BMI 33.58 kg/m2     Constitutional: WDWN, Pleasant and appropriate affect, No acute distress.      Neuro/Psych:  Alert and oriented x 3. Affect appropriate.     Urinalysis shows:   Results for orders placed or performed in visit on 01/20/15   AMB POC URINALYSIS DIP STICK AUTO W/O MICRO   Result Value Ref Range    Color (UA POC) Yellow     Clarity (UA POC) Clear     Glucose (UA POC) Negative Negative    Bilirubin (UA POC) Negative Negative    Ketones (UA POC) Negative Negative    Specific gravity (UA POC) 1.030 1.001 - 1.035    Blood (UA POC) Negative Negative    pH (UA POC) 5.5 4.6 - 8.0    Protein (UA POC) Negative Negative mg/dL    Urobilinogen (UA POC) 0.2 mg/dL 0.2 - 1    Nitrites (UA POC) Negative Negative    Leukocyte esterase (UA POC) Negative Negative   AMB POC PVR, MEAS,POST-VOID RES,US,NON-IMAGING   Result Value Ref Range    PVR 0 cc         ICD-10-CM ICD-9-CM    1. Benign prostatic hyperplasia with lower urinary tract symptoms, unspecified morphology N40.1 600.01 AMB POC URINALYSIS DIP STICK AUTO W/O MICRO      AMB POC PVR, MEAS,POST-VOID RES,US,NON-IMAGING   2. Nocturia R35.1 788.43 AMB POC PVR, MEAS,POST-VOID RES,US,NON-IMAGING  3. Urinary urgency R39.15 788.63    4. Urinary frequency R35.0 788.41      Fortino Sic, MD  [facility]     Notes with recommendations sent to:  Donzetta Matters, MD    Medical Documentation is provided with the assistance of Frederich Cha, medical scribe for Arlyce Harman, MD.

## 2015-01-29 ENCOUNTER — Ambulatory Visit: Admit: 2015-01-29 | Discharge: 2015-01-29 | Payer: MEDICARE | Attending: Urology | Primary: Internal Medicine

## 2015-01-29 DIAGNOSIS — N401 Enlarged prostate with lower urinary tract symptoms: Secondary | ICD-10-CM

## 2015-01-29 LAB — AMB POC URINALYSIS DIP STICK AUTO W/O MICRO
Blood (UA POC): NEGATIVE
Glucose (UA POC): NEGATIVE
Leukocyte esterase (UA POC): NEGATIVE
Nitrites (UA POC): NEGATIVE
Protein (UA POC): NEGATIVE mg/dL
Specific gravity (UA POC): 1.02 (ref 1.001–1.035)
Urobilinogen (UA POC): 0.2 (ref 0.2–1)
pH (UA POC): 7 (ref 4.6–8.0)

## 2015-01-29 MED ORDER — TRIMETHOPRIM-SULFAMETHOXAZOLE 80 MG-400 MG TAB
80-400 mg | ORAL_TABLET | Freq: Two times a day (BID) | ORAL | 0 refills | Status: DC
Start: 2015-01-29 — End: 2015-03-10

## 2015-01-29 NOTE — Progress Notes (Signed)
ASSESSMENT:   1. Benign prostatic hyperplasia with lower urinary tract symptoms, unspecified morphology    2. Perirectal abscess      1. Moderate irritative LUTS, specifically nocturia, frequency, and urgency with UUI. Initiated on flomax 0.4mg  daily with worsening symptoms subjectively. UDS revealed a small capacity bladder with delayed sensations. He had DO near capacity without leakage. He voided with a high pressure detrusor contraction, low flow, slight abdominal relaxation, relaxed EUS and emptied well. His pressure-flow parameters are in the markedly obstructive range.  2. Incomplete bladder emptying, improved on alpha blocker. ??  3. Screening PSA, 0.7ng/ml on 07/18/13, wnl ??  4. PMH notable for; Hep C, Type II Diabetes controlled with diet and exercise. S/p cervical and lumbar laminectomy in the past     PLAN:    Cysto reveals 1+ trabeculation and trilobar hypertrophy. TRUS volume 49cc  GU exam reveals perirectal abscess. Rx for Bactrim BID x 14 day sent. Will need to f/u with PCP if this does not resolve.  Patient will be scheduled for cystoscopy, Pro-Touch Laser Enucleation of the Prostate with Tissue Morcellation at the next available date. Risks and benefits reviewed in detail. All questions answered and he would like to proceed.     No chief complaint on file.      HISTORY OF PRESENT ILLNESS:  Aaron Reid is a 59 y.o. male presents today for a cystoscopy/TRUS volume. Patient is doing well and would like to proceed. No f/n/c/v. C/o small perirectal area that has been bothering him.  No drainage.  No erythema. Still with moderate LUTs on flomax.  States has to time his car trips or unable to make if to the bathroom.  C/o incomplete emptying.  States symptoms progressive over the last year.         AUA Symptom Score 01/20/2015   Over the past month how often have you had the sensation that your bladder was not completely empty after you finished urinating? 0    Over the past month, how often have had to urinate again less than 2 hours after you last finished urinating? 3   Over the past month, how often have you found you stopped and started again several times when you urinated? 0   Over the past month, how often have you found it difficult to postpone urination? 3   Over the past month, how often have you had a weak urinary stream? 0   Over the past month, how often have you had to push or strain to begin urinating? 0   Over the past month, how many times did you most typically get up to urinate from the time you went to bed at night until the time you got up in the morning? 3   AUA Score 9   If you were to spend the rest of your life with your urinary condition the way it is now, how would you feel about that? Mostly dissatisfied         Past Medical History   Diagnosis Date   ??? Arthritis    ??? Back injury    ??? Benign prostatic hyperplasia without lower urinary tract symptoms    ??? Cervical fusion syndrome 10/2003   ??? Cervical fusion syndrome 2005   ??? Cervical spondylosis 12/24/2012   ??? Chronic pain 1980     lower back    ??? Chronic pain 1980     lower back   ??? DDD (degenerative disc disease), cervical 12/24/2012   ???  DDD (degenerative disc disease), lumbar 12/24/2012   ??? DJD (degenerative joint disease), multiple sites 12/24/2012   ??? Foraminal stenosis of cervical region 12/24/2012   ??? GAD (generalized anxiety disorder) 12/24/2012   ??? GERD (gastroesophageal reflux disease)    ??? Head injury    ??? Hepatitis C 12/24/2012   ??? Hepatitis C infection    ??? History of neurogenic bladder    ??? Incomplete bladder emptying    ??? Insomnia 12/24/2012   ??? LBP (low back pain) 12/24/2012   ??? Lower urinary tract symptoms (LUTS)    ??? Lumbar spondylosis 12/24/2012   ??? Musculoskeletal pain 12/24/2012   ??? Neck injury    ??? Neck pain 12/24/2012   ??? Neurogenic bladder    ??? Opioid dependence (HCC) 02/21/2013   ??? S/P cervical spinal fusion 12/24/2012   ??? S/P lumbar spinal fusion 12/24/2012    ??? Spasmodic torticollis 12/24/2012   ??? Spondylolisthesis of cervical region 12/24/2012   ??? Urge incontinence    ??? Urinary frequency    ??? Urinary urgency        Past Surgical History   Procedure Laterality Date   ??? Endoscopy, colon, diagnostic     ??? Hx orthopaedic       spinal fusion, lumbar, cervical   ??? Hx orthopaedic       spinal fusion, lumbar cervical       Social History   Substance Use Topics   ??? Smoking status: Never Smoker   ??? Smokeless tobacco: Never Used   ??? Alcohol use No       Allergies   Allergen Reactions   ??? Latex Rash   ??? Cymbalta [Duloxetine] Anxiety   ??? Duragesic [Fentanyl] Drowsiness     Altered LOC       Family History   Problem Relation Age of Onset   ??? Heart Disease Sister    ??? Diabetes Brother    ??? Stroke Brother        Current Outpatient Prescriptions   Medication Sig Dispense Refill   ??? trimethoprim-sulfamethoxazole (BACTRIM, SEPTRA) 80-400 mg per tablet Take 1 Tab by mouth two (2) times a day. 28 Tab 0   ??? buprenorphine-naloxone (SUBOXONE) 8-2 mg film sublingaul film by SubLINGual route daily.     ??? tamsulosin (FLOMAX) 0.4 mg capsule Take 1 Cap by mouth daily (after dinner). 30 Cap 5   ??? tiZANidine (ZANAFLEX) 2 mg tablet 1-2 every 6 hours as needed for pain 60 Tab 3   ??? LORazepam (ATIVAN) 0.5 mg tablet Take 1 Tab by mouth every eight (8) hours as needed for Anxiety. 30 Tab 1   ??? aspirin delayed-release 81 mg tablet Take  by mouth daily.         Review of Systems  Constitutional: Fever: No  Skin: Rash: No  HEENT: Hearing difficulty: No  Eyes: Blurred vision: No  Cardiovascular: Chest pain: No  Respiratory: Shortness of breath: No  Gastrointestinal: Nausea/vomiting: No  Musculoskeletal: Back pain: No  Neurological: Weakness: No  Psychological: Memory loss: No  Comments/additional findings:         PHYSICAL EXAMINATION:   Visit Vitals   ??? BP 116/62   ??? Ht  (1.778 m)   ??? Wt 234 lb (106.1 kg)   ??? BMI 33.58 kg/m2     Constitutional: WDWN, Pleasant and appropriate affect, No acute distress.     CV:  No peripheral swelling noted  Respiratory: No respiratory distress or difficulties  Abdomen:  No abdominal masses or tenderness. No CVA tenderness. No inguinal hernias noted.   GU Male: perirectal abscess with mild fluctuance on the R side, no erythema, minimal tenderness, no drainage  Skin: No jaundice.    Neuro/Psych:  Alert and oriented x 3, affect appropriate.   Lymphatic:   No enlarged inguinal lymph nodes.        REVIEW OF LABS AND IMAGING:    Results for orders placed or performed in visit on 01/29/15   AMB POC URINALYSIS DIP STICK AUTO W/O MICRO   Result Value Ref Range    Color (UA POC) Yellow     Clarity (UA POC) Clear     Glucose (UA POC) Negative Negative    Bilirubin (UA POC) 1+ Negative    Ketones (UA POC) Trace Negative    Specific gravity (UA POC) 1.020 1.001 - 1.035    Blood (UA POC) Negative Negative    pH (UA POC) 7.0 4.6 - 8.0    Protein (UA POC) Negative Negative mg/dL    Urobilinogen (UA POC) 0.2 mg/dL 0.2 - 1    Nitrites (UA POC) Negative Negative    Leukocyte esterase (UA POC) Negative Negative         A copy of today's office visit with all pertinent imaging results and labs were sent to the referring physician.        Manual Meierouglas C Blanca Thornton, MD       Documentation provided by Benita GutterAllison Z Matias for Manual Meierouglas C Samul Mcinroy, MD.

## 2015-01-29 NOTE — Progress Notes (Signed)
CYSTOSCOPY PROCEDURE        Patient Name: Aaron Reid            Date of Procedure: 01/30/2015     Pre-procedure Diagnosis:     ICD-10-CM ICD-9-CM    1. Benign prostatic hyperplasia with lower urinary tract symptoms, unspecified morphology N40.1 600.01 AMB POC URINALYSIS DIP STICK AUTO W/O MICRO      CYSTOURETHROSCOPY   2. Perirectal abscess K61.1 566 CYSTOURETHROSCOPY      1. Moderate irritative LUTS, specifically nocturia, frequency, and urgency with UUI. Initiated on flomax 0.4mg  daily with worsening symptoms subjectively. UDS revealed a small capacity bladder with delayed sensations. He had DO near capacity without leakage. He voided with a high pressure detrusor contraction, low flow, slight abdominal relaxation, relaxed EUS and emptied well. His pressure-flow parameters are in the markedly obstructive range.  2. Incomplete bladder emptying, improved on alpha blocker.   3. Screening PSA, 0.7ng/ml on 07/18/13, wnl   4. PMH notable for; Type II Diabetes controlled with diet and exercise. S/p cervical and lumbar laminectomy in the past       Post-procedure Diagnosis: Same as above.     Consent:  All risks, benefits and options were reviewed in detail and the patient agrees to procedure. Risks include but are not limited to bleeding, infection, sepsis, death, dysuria and others.     Procedure:  The patient was placed in the supine position, and prepped and draped in the normal fashion. 5 ml of 4% Lidocaine gel was placed in the urethra. Once adequate anesthesia was achieved; the flexible cystoscope was placed into the bladder.     Findings as follows:      Meatus: normal  Urethra: normal  Prostate: trilobar hypertrophy  TRUS Volume: 51.31  Density: 0  Bladder neck: normal  Trigone:  normal  Trabeculation:1+  Diverticuli:  none  Lesion:  none    Antibiotic provided:  Yes     Lab / Imaging:   Results for orders placed or performed in visit on 01/29/15   AMB POC URINALYSIS DIP STICK AUTO W/O MICRO    Result Value Ref Range    Color (UA POC) Yellow     Clarity (UA POC) Clear     Glucose (UA POC) Negative Negative    Bilirubin (UA POC) 1+ Negative    Ketones (UA POC) Trace Negative    Specific gravity (UA POC) 1.020 1.001 - 1.035    Blood (UA POC) Negative Negative    pH (UA POC) 7.0 4.6 - 8.0    Protein (UA POC) Negative Negative mg/dL    Urobilinogen (UA POC) 0.2 mg/dL 0.2 - 1    Nitrites (UA POC) Negative Negative    Leukocyte esterase (UA POC) Negative Negative        Diagnoses:       ICD-10-CM ICD-9-CM    1. Benign prostatic hyperplasia with lower urinary tract symptoms, unspecified morphology N40.1 600.01 AMB POC URINALYSIS DIP STICK AUTO W/O MICRO      CYSTOURETHROSCOPY   2. Perirectal abscess K61.1 566 CYSTOURETHROSCOPY          SUMMARY OF VISIT AND PLAN:    Cysto reveals 1+ trabeculation, trilobar hypertrophy.   Plan: see separate office note.       Manual Meier, MD     Transrectal Ultrasound of Prostate     Patient: Aaron Reid  Date: 01/30/15      Indications:  BPH Screening  PSA:  Lab Results   Component Value Date/Time    PROSTATE SPECIFIC AG 0.7 07/18/2013 02:45 PM         Palpable Nodule: no    Procedure:  An endorectal probe was placed in the rectum.  Transverse and sagittal images of the prostate and seminal vesicles were obtained.  Measurements were taken  The probe was removed and the patient was observed.  Biopsy-Number of cores:             Interpretation:    Prostate Exam: normal size  Prostatic Volume: 49 grams  Prostatic capsule: regular   Transition Zone: normal   Lesions: none  Seminal Vesicles: normal    Manual Meierouglas C Karmin Kasprzak, MD

## 2015-02-01 NOTE — Progress Notes (Signed)
Left message on vm to return call regarding ESWL

## 2015-02-04 NOTE — Progress Notes (Signed)
The patient called on yesterday and left a message that he received a message from Aaron Reid to schedule him for an ESWL. I called the patient and left a message on his voice mail that I spoke with Dr. Tresa Endo and that Maralyn Sago called him in error. He is not having a ESWL, only the procedure scheduled on 03/03/15 at [facility]. JW

## 2015-02-05 NOTE — Addendum Note (Signed)
Addended by: Donzetta Matters on: 02/05/2015 02:38 PM      Modules accepted: Orders

## 2015-02-05 NOTE — Telephone Encounter (Signed)
Pt called stating the his Pain Management doctor called him stating his referral is expired. He has an appt next week and needs a new referral.    Jairo Ben  [phone] [facility]  [place] [address]  Phone: [phone]  Fax: [phone]

## 2015-02-05 NOTE — Progress Notes (Signed)
The patient came into the office on 01/29/15. I gave him pre-op surgery instructions. He is on 81 mg of aspirin. I told him to stop taking 7 days prior to his surgery because no MD told him to take it. He is on no blood pressure medication, no blood thinner. On today I mailed the patient a surgery reminder letter, Pre-operative instruction letter, Per-operative testing letter, medications to avoid form. I sent an email to [facility] to schedule the Pro-Touch Laser with [facility]. I received a email confimation. Genoveva Ill,    This case is confirmed on our schedule:    ProTouch and [facility] # [id number]  03-03-15 @ 9:00am  Dr. Tresa Endo  [place]

## 2015-02-09 ENCOUNTER — Ambulatory Visit: Admit: 2015-02-09 | Discharge: 2015-02-09 | Payer: MEDICARE | Attending: Pain Medicine | Primary: Internal Medicine

## 2015-02-09 ENCOUNTER — Ambulatory Visit: Attending: Pain Medicine | Primary: Internal Medicine

## 2015-02-09 DIAGNOSIS — M961 Postlaminectomy syndrome, not elsewhere classified: Secondary | ICD-10-CM

## 2015-02-09 LAB — AMB POC DRUG SCREEN (G0477)
AMPHETAMINES UR POC: NEGATIVE
BARBITURATES UR POC: NEGATIVE
BENZODIAZEPINES UR POC: NEGATIVE
CANNABINOIDS UR POC: NEGATIVE
COCAINE UR POC: NEGATIVE
MDMA/ECSTASY UR POC: NEGATIVE
METHADONE UR POC: NEGATIVE
METHAMPHETAMINE UR POC: NEGATIVE
METHYLPHENIDATE UR POC: NEGATIVE
OPIATES UR POC: NEGATIVE
OXYCODONE UR POC: NEGATIVE
PHENCYCLIDINE UR POC: NEGATIVE
TRICYCLICS UR POC: NEGATIVE

## 2015-02-09 MED ORDER — LUBIPROSTONE 8 MCG CAP
8 mcg | ORAL_CAPSULE | Freq: Two times a day (BID) | ORAL | 5 refills | Status: AC
Start: 2015-02-09 — End: 2015-03-11

## 2015-02-09 MED ORDER — BUPRENORPHINE-NALOXONE 8 MG-2 MG SUBLINGUAL FILM
8-2 mg | ORAL_FILM | Freq: Two times a day (BID) | SUBLINGUAL | 2 refills | Status: DC
Start: 2015-02-09 — End: 2015-03-10

## 2015-02-09 NOTE — Progress Notes (Signed)
Nursing Notes    Patient presents to the office today in follow-up.   Patient rates his pain at 6/10 on the numerical pain scale.     Reviewed medications with counts as follows:    Rx Date filled Qty Dispensed Pill Count Last Dose Short   suboxone 12/21/14 60 4 02/08/15 no                                            Comments: pt has laser surgery for prostate coming up    POC UDS was performed in office today    Any new labs or imaging since last appointment? YES, [place] of prostate     Have you been to an emergency room (ER) or urgent care clinic since your last visit?  NO            Have you been hospitalized since your last visit? NO     If yes, where, when, and reason for visit?     Have you seen or consulted any other health care providers outside of the [facility]  since your last visit?  NO     If yes, where, when, and reason for visit?     Aaron Reid has a reminder for a "due or due soon" health maintenance. I have asked that he contact his primary care provider for follow-up on this health maintenance.

## 2015-02-09 NOTE — Progress Notes (Signed)
HISTORY OF PRESENT ILLNESS  Aaron Reid is a 59 y.o. male.  HPI he returns for follow-up of chronic, severe pain involving the cervical spine in both hands related to a post cervical laminectomy pain syndrome as well as chronic low back pain related to underlying spondylosis and degenerative disc disease.    Pain continues under adequate control, averaging 6/10 with 60% overall relief.  Physical activity and mobility, mood, and sleep are all fair.  No reported side effects.    A review of the [place] prescription monitoring program does not identify any inconsistency.    UDS obtained and reviewed; formal confirmation from laboratory is pending.      Review of Systems   Constitutional: Positive for malaise/fatigue.   Gastrointestinal: Positive for constipation, heartburn and nausea.   Musculoskeletal: Positive for back pain, joint pain (polyarticular), myalgias and neck pain.   Neurological: Positive for weakness (generalized).   Psychiatric/Behavioral: The patient has insomnia.    All other systems reviewed and are negative.      Physical Exam   Constitutional: He is oriented to person, place, and time. He appears distressed.   HENT:   Head: Normocephalic and atraumatic.   Right Ear: External ear normal.   Left Ear: External ear normal.   Nose: Nose normal.   Mouth/Throat: Oropharynx is clear and moist. No oropharyngeal exudate.   Eyes: Conjunctivae and EOM are normal. Pupils are equal, round, and reactive to light. Right eye exhibits no discharge. Left eye exhibits no discharge. No scleral icterus.   Neck: Spinous process tenderness and muscular tenderness (spasms) present. Decreased range of motion present. No thyromegaly present.   Cardiovascular: Normal rate, regular rhythm and normal heart sounds.    Pulmonary/Chest: Effort normal and breath sounds normal. No respiratory distress. He has no wheezes. He has no rales.   Abdominal: Soft. He exhibits no distension. There is no tenderness. There  is no rebound and no guarding.   Musculoskeletal: He exhibits tenderness.        Right shoulder: He exhibits decreased range of motion, tenderness and pain.        Left shoulder: He exhibits decreased range of motion, tenderness and pain.        Right elbow: He exhibits decreased range of motion. Tenderness (diffuse) found.        Left elbow: He exhibits decreased range of motion. Tenderness (crepitus) found.        Right wrist: He exhibits decreased range of motion, tenderness, bony tenderness and crepitus.        Left wrist: He exhibits decreased range of motion, tenderness, bony tenderness and crepitus.        Right knee: He exhibits decreased range of motion (crepitus) and bony tenderness.        Left knee: He exhibits decreased range of motion (crepitus) and bony tenderness.        Right ankle: He exhibits decreased range of motion. Tenderness.        Left ankle: He exhibits decreased range of motion. Tenderness.        Lumbar back: He exhibits decreased range of motion, tenderness, pain and spasm.   Neurological: He is alert and oriented to person, place, and time. He has normal reflexes. No cranial nerve deficit or sensory deficit. He exhibits normal muscle tone. Gait abnormal. Coordination normal.   Reflex Scores:       Tricep reflexes are 2+ on the right side and 2+ on the left side.  Bicep reflexes are 2+ on the right side and 2+ on the left side.       Brachioradialis reflexes are 2+ on the right side and 2+ on the left side.       Patellar reflexes are 2+ on the right side and 2+ on the left side.       Achilles reflexes are 2+ on the right side and 2+ on the left side.  Skin: Skin is warm. No rash noted.   Psychiatric: He has a normal mood and affect. His behavior is normal. Judgment and thought content normal.   Nursing note and vitals reviewed.      ASSESSMENT and PLAN  Encounter Diagnoses   Name Primary?   ??? Cervical post-laminectomy syndrome Yes   ??? DDD (degenerative disc disease), lumbar     ??? Spondylosis of lumbar region without myelopathy or radiculopathy    ??? Neck pain    ??? DDD (degenerative disc disease), cervical    ??? Spondylolisthesis of cervical region    ??? Spondylosis of cervical region without myelopathy or radiculopathy    ??? Encounter for long-term (current) use of high-risk medication    ??? Musculoskeletal pain      We will continue on his current analgesic regimen as this is providing excellent pain control with improve functionality and minimal side effects.  3 month reassess him    No concerns are raised for misuse, abuse, or diversion.    1. Pain medications are prescribed with the objective of pain relief and improved physical and psychosocial function in this patient.  2. Counseled patient on proper use of prescribed medications and reviewed opioid contract.  3. Counseled patient about chronic medical conditions and their relationship to anxiety and depression and recommended mental health support as needed.  4. Reviewed with patient self-help tools, home exercise, and lifestyle changes to assist the patient in self-management of symptoms.  5. Advised patient to have a primary care provider to continue care for health maintenance and general medical conditions and support for referral to specialty care as needed.  6. Reviewed with patient the treatment plan, goals of treatment plan, and limitations of treatment plan, to include the potential for side effects from medications and procedures. If side effects occur, it is the responsibility of the patient to inform the clinic so that a change in the treatment plan can be made in a safe manner. The patient is advised that stopping prescribed medication may cause an increase in symptoms and possible medication withdrawal symptoms. The patient is informed an emergency room evaluation may be necessary if this occurs.  DISPOSITION: The patient???s condition and plan were discussed at length and  all questions were answered. The patient agrees with the plan.    Counseling occupied > 50% of visit:  Total time: 30 minutes

## 2015-02-09 NOTE — Patient Instructions (Signed)
Current health maintenance issues were reviewed and the patient was advised to followup with his/her PCP for completion of these items.

## 2015-02-17 ENCOUNTER — Encounter: Admit: 2015-02-17 | Primary: Internal Medicine

## 2015-02-17 ENCOUNTER — Ambulatory Visit: Admit: 2015-02-17 | Payer: MEDICARE | Attending: Internal Medicine | Primary: Internal Medicine

## 2015-02-17 ENCOUNTER — Encounter: Attending: Internal Medicine | Primary: Internal Medicine

## 2015-02-17 DIAGNOSIS — K739 Chronic hepatitis, unspecified: Secondary | ICD-10-CM

## 2015-02-17 DIAGNOSIS — Z01818 Encounter for other preprocedural examination: Secondary | ICD-10-CM

## 2015-02-17 NOTE — Progress Notes (Signed)
Aaron Reid is a 59 y.o. male here today for a pre op. Patient c/o back pain. Pain scale 6/10      1. Have you been to the ER, urgent care clinic since your last visit?  Hospitalized since your last visit?No    2. Have you seen or consulted any other health care providers outside of the [facility] since your last visit?  Include any pap smears or colon screening. Yes urology

## 2015-02-17 NOTE — Progress Notes (Signed)
Preoperative Evaluation    Date of Exam: 02/17/2015    Aaron Reid is a 59 y.o. male (DOB:January 03, 1956) who presents for preoperative evaluation.   Procedure/Surgery:prostatectomy  Date of Procedure/Surgery: 03/03/2015  Surgeon: Dr. Tresa Endo  Hospital[facility]urgical [facility]  Primary Physician: Donzetta Matters, MD  Latex Allergy: yes    Problem List:     Patient Active Problem List    Diagnosis Date Noted   ??? Cervical post-laminectomy syndrome 02/09/2015   ??? Benign prostatic hyperplasia with lower urinary tract symptoms 01/20/2015   ??? Encounter for long-term (current) use of high-risk medication 05/06/2014   ??? Unspecified essential hypertension 02/12/2014   ??? Chronic hepatitis C (HCC) 02/25/2013   ??? Opioid dependence (HCC) 02/21/2013   ??? LBP (low back pain) 12/24/2012   ??? DDD (degenerative disc disease), lumbar 12/24/2012   ??? S/P lumbar spinal fusion 12/24/2012   ??? Lumbar spondylosis 12/24/2012   ??? Neck pain 12/24/2012   ??? DDD (degenerative disc disease), cervical 12/24/2012   ??? S/P cervical spinal fusion 12/24/2012   ??? Spondylolisthesis of cervical region 12/24/2012   ??? Cervical spondylosis 12/24/2012   ??? Foraminal stenosis of cervical region 12/24/2012   ??? DJD (degenerative joint disease), multiple sites 12/24/2012   ??? Musculoskeletal pain 12/24/2012   ??? Spasmodic torticollis 12/24/2012   ??? GAD (generalized anxiety disorder) 12/24/2012   ??? Hepatitis C 12/24/2012   ??? GERD (gastroesophageal reflux disease) 12/24/2012   ??? Insomnia 12/24/2012     Medical History:     Past Medical History   Diagnosis Date   ??? Arthritis    ??? Back injury    ??? Benign prostatic hyperplasia without lower urinary tract symptoms    ??? Cervical fusion syndrome 10/2003   ??? Cervical fusion syndrome 2005   ??? Cervical spondylosis 12/24/2012   ??? Chronic pain 1980     lower back    ??? Chronic pain 1980     lower back   ??? DDD (degenerative disc disease), cervical 12/24/2012   ??? DDD (degenerative disc disease), lumbar 12/24/2012    ??? DJD (degenerative joint disease), multiple sites 12/24/2012   ??? Foraminal stenosis of cervical region 12/24/2012   ??? GAD (generalized anxiety disorder) 12/24/2012   ??? GERD (gastroesophageal reflux disease)    ??? Head injury    ??? Hepatitis C 12/24/2012   ??? Hepatitis C infection    ??? History of neurogenic bladder    ??? Incomplete bladder emptying    ??? Insomnia 12/24/2012   ??? LBP (low back pain) 12/24/2012   ??? Lower urinary tract symptoms (LUTS)    ??? Lumbar spondylosis 12/24/2012   ??? Musculoskeletal pain 12/24/2012   ??? Neck injury    ??? Neck pain 12/24/2012   ??? Neurogenic bladder    ??? Opioid dependence (HCC) 02/21/2013   ??? S/P cervical spinal fusion 12/24/2012   ??? S/P lumbar spinal fusion 12/24/2012   ??? Spasmodic torticollis 12/24/2012   ??? Spondylolisthesis of cervical region 12/24/2012   ??? Urge incontinence    ??? Urinary frequency    ??? Urinary urgency      Allergies:     Allergies   Allergen Reactions   ??? Latex Rash   ??? Cymbalta [Duloxetine] Anxiety   ??? Duragesic [Fentanyl] Drowsiness     Altered LOC      Medications:     Current Outpatient Prescriptions   Medication Sig   ??? lubiPROStone (AMITIZA) 8 mcg capsule Take 1 Cap by mouth two (  2) times daily (with meals) for 30 days. Indications: OPIOID-INDUCED CONSTIPATION   ??? [START ON 03/09/2015] buprenorphine-naloxone (SUBOXONE) 8-2 mg film sublingaul film 1 Film by SubLINGual route two (2) times a day for 30 days. Max Daily Amount: 2 Film. Indications: CHRONIC PAIN, OPIOID DEPENDENCE   ??? buprenorphine-naloxone (SUBOXONE) 8-2 mg film sublingaul film by SubLINGual route daily.   ??? tamsulosin (FLOMAX) 0.4 mg capsule Take 1 Cap by mouth daily (after dinner).   ??? tiZANidine (ZANAFLEX) 2 mg tablet 1-2 every 6 hours as needed for pain   ??? LORazepam (ATIVAN) 0.5 mg tablet Take 1 Tab by mouth every eight (8) hours as needed for Anxiety.   ??? aspirin delayed-release 81 mg tablet Take  by mouth daily.   ??? trimethoprim-sulfamethoxazole (BACTRIM, SEPTRA) 80-400 mg per tablet  Take 1 Tab by mouth two (2) times a day.     No current facility-administered medications for this visit.      Surgical History:     Past Surgical History   Procedure Laterality Date   ??? Endoscopy, colon, diagnostic     ??? Hx orthopaedic       spinal fusion, lumbar, cervical   ??? Hx orthopaedic       spinal fusion, lumbar cervical     Social History:     Social History     Social History   ??? Marital status: SINGLE     Spouse name: N/A   ??? Number of children: N/A   ??? Years of education: N/A     Social History Main Topics   ??? Smoking status: Never Smoker   ??? Smokeless tobacco: Never Used   ??? Alcohol use No   ??? Drug use: Yes     Special: Cocaine, Marijuana, Other   ??? Sexual activity: No     Other Topics Concern   ??? None     Social History Narrative    ** Merged History Encounter **            Recent use of: ASA    Tetanus up to date: last tetanus booster within 10 years      Anesthesia Complications: None  History of abnormal bleeding : None  History of Blood Transfusions: yes  Health Care Directive or Living Will: no    REVIEW OF SYSTEMS:  A comprehensive review of systems was negative.    EXAM:   Visit Vitals   ??? BP 132/87 (BP 1 Location: Right arm, BP Patient Position: Sitting)   ??? Pulse 85   ??? Temp 97.5 ??F (36.4 ??C) (Oral)   ??? Resp 18   ??? Ht 5\' 10"  (1.778 m)   ??? Wt 235 lb (106.6 kg)   ??? SpO2 96%   ??? BMI 33.72 kg/m2     General appearance - alert, well appearing, and in no distress, oriented to person, place, and time, overweight and well hydrated  Mental status - alert, oriented to person, place, and time, normal mood, behavior, speech, dress, motor activity, and thought processes  Eyes - pupils equal and reactive, extraocular eye movements intact, sclera anicteric  Neck - supple, no significant adenopathy, carotids upstroke normal bilaterally, no bruits  Lymphatics - no palpable lymphadenopathy, no hepatosplenomegaly  Chest - clear to auscultation, no wheezes, rales or rhonchi, symmetric air entry   Heart - normal rate, regular rhythm, normal S1, S2, no murmurs, rubs, clicks or gallops  Abdomen - soft, nontender, nondistended, no masses or organomegaly  bowel sounds normal  no  bladder distension noted  no abdominal bruits  no pulsatile masses  no CVA tenderness  Back exam - full range of motion, no tenderness, palpable spasm or pain on motion, normal reflexes and strength bilateral lower extremities, sensory exam intact bilateral lower extremities  Neurological - alert, oriented, normal speech, no focal findings or movement disorder noted, screening mental status exam normal, neck supple without rigidity, cranial nerves II through XII intact, motor and sensory grossly normal bilaterally, normal muscle tone, no tremors, strength 5/5  Musculoskeletal - no joint tenderness, deformity or swelling, no muscular tenderness noted, full range of motion without pain  Extremities - peripheral pulses normal, no pedal edema, no clubbing or cyanosis, no pedal edema noted, intact peripheral pulses  Skin - normal coloration and turgor, no rashes, no suspicious skin lesions noted      DIAGNOSTICS:   1. EKG: EKG FINDINGS - normal EKG, normal sinus rhythm, nonspecific ST and T waves changes  No change from prior study  2. CXR: was negative for infiltrate, effusion, pneumothorax, or wide mediastinum  3. Labs: done    IMPRESSION:   Chronic liver disease  Chronic opiate use  No contraindications to planned surgery  High risk due to above    Donzetta Matters, MD   02/17/2015

## 2015-02-17 NOTE — Telephone Encounter (Signed)
I called [phone] at 4:55pm on 02/17/2015 and left a message for Aaron Reid t o return our call to schedule an appointment with [facility], for a colon screening. [name] insurance is [employer]), which requires an insurance referral...JDG

## 2015-02-18 ENCOUNTER — Inpatient Hospital Stay: Admit: 2015-02-18 | Payer: MEDICARE | Primary: Internal Medicine

## 2015-02-18 DIAGNOSIS — N401 Enlarged prostate with lower urinary tract symptoms: Secondary | ICD-10-CM

## 2015-02-18 LAB — METABOLIC PANEL, BASIC
Anion gap: 8 mmol/L (ref 3.0–18)
BUN/Creatinine ratio: 12 (ref 12–20)
BUN: 13 MG/DL (ref 7.0–18)
CO2: 28 mmol/L (ref 21–32)
Calcium: 9.5 MG/DL (ref 8.5–10.1)
Chloride: 102 mmol/L (ref 100–108)
Creatinine: 1.12 MG/DL (ref 0.6–1.3)
GFR est AA: >60 mL/min/{1.73_m2} (ref >60)
GFR est non-AA: >60 mL/min/{1.73_m2} (ref >60)
Glucose: 118 mg/dL — ABNORMAL HIGH (ref 74–99)
Potassium: 4.8 mmol/L (ref 3.5–5.5)
Sodium: 138 mmol/L (ref 136–145)

## 2015-02-18 LAB — PROTHROMBIN TIME + INR
INR: 1.1 (ref 0.8–1.2)
Prothrombin time: 13.6 s (ref 11.5–15.2)

## 2015-02-18 LAB — CBC WITH AUTOMATED DIFF
ABS. BASOPHILS: 0 10*3/uL (ref 0.0–0.06)
ABS. EOSINOPHILS: 0 10*3/uL (ref 0.0–0.4)
ABS. LYMPHOCYTES: 1.8 10*3/uL (ref 0.9–3.6)
ABS. MONOCYTES: 0.2 10*3/uL (ref 0.05–1.2)
ABS. NEUTROPHILS: 2 10*3/uL (ref 1.8–8.0)
BASOPHILS: 0 % (ref 0–2)
EOSINOPHILS: 1 % (ref 0–5)
HCT: 48.9 % — ABNORMAL HIGH (ref 36.0–48.0)
HGB: 16.1 g/dL — ABNORMAL HIGH (ref 13.0–16.0)
LYMPHOCYTES: 45 % (ref 21–52)
MCH: 30 PG (ref 24.0–34.0)
MCHC: 32.9 g/dL (ref 31.0–37.0)
MCV: 91.2 FL (ref 74.0–97.0)
MONOCYTES: 6 % (ref 3–10)
MPV: 13.7 FL — ABNORMAL HIGH (ref 9.2–11.8)
NEUTROPHILS: 48 % (ref 40–73)
PLATELET: 183 10*3/uL (ref 135–420)
RBC: 5.36 M/uL (ref 4.70–5.50)
RDW: 12.8 % (ref 11.6–14.5)
WBC: 4.1 10*3/uL — ABNORMAL LOW (ref 4.6–13.2)

## 2015-02-18 LAB — PTT: aPTT: 35.9 s (ref 23.0–36.4)

## 2015-02-18 NOTE — Progress Notes (Signed)
Fax to surgeon with ekg, cxr and my preop note

## 2015-02-19 NOTE — Progress Notes (Signed)
Fax this and all other results and preop note to surgeon

## 2015-02-19 NOTE — Progress Notes (Signed)
Fax this and everything else to surgeon

## 2015-02-19 NOTE — Progress Notes (Signed)
Faxed over.

## 2015-02-20 LAB — CULTURE, URINE
Culture result:: NO GROWTH
Culture: NO GROWTH

## 2015-03-02 NOTE — Progress Notes (Signed)
The patient called on yesterday and left a message on my voice mail that he would like to cancel his surgery and would like to come into the office to have a talk with Dr. Tresa Endo.  I told Dr. Tresa Endo.  I called Dr. Tresa Endo on today. Per Dr. Tresa Endo he called the patients number several times and left messages, but the patient did not call him back. Go ahead and cancel the surgery. I called the patient and left a message that I cancelled his surgery and hi VT appointment on 03/08/15 and his post op appointment on 03/19/15. I left a message for him to call me back if he would like to schedule an appointment with Dr. Tresa Endo. I called Shennan at [facility] and told her to cancel the surgery. I sent an email to [facility] to cancel the Pro-Touch.  I received an email confirmation back. Genoveva Ill,    Thank you, I have removed this from our schedule for tomorrow.    Su Monks  [facility]  [phone]    On Mar 02, 2015, at 9:16 AM, Lynnell Jude [email or web address]> wrote:  [name],  The patient Aaron Reid scheduled for surgery on 03/03/15 at 9:00 at [facility] has been cancelled.

## 2015-03-04 NOTE — Progress Notes (Signed)
The patient called to request an appointment with Dr. Tresa Endo to discuss treatment options. I scheduled him for 03/10/15 at 2:30 pm. JW

## 2015-03-08 ENCOUNTER — Encounter: Primary: Internal Medicine

## 2015-03-08 ENCOUNTER — Ambulatory Visit: Admit: 2015-03-08 | Discharge: 2015-03-08 | Payer: MEDICARE | Attending: Nurse Practitioner | Primary: Internal Medicine

## 2015-03-08 DIAGNOSIS — Z1211 Encounter for screening for malignant neoplasm of colon: Secondary | ICD-10-CM

## 2015-03-08 MED ORDER — PEG 3350-ELECTROLYTES 240 G-22.72 G-6.72 G-5.84 G ORAL SOLUTION
ORAL | 0 refills | Status: AC
Start: 2015-03-08 — End: 2015-03-08

## 2015-03-08 NOTE — Progress Notes (Signed)
Patient presents today for a colon ca screening. Patient states that his Bm's are once daily and he denies any blood in his stools.

## 2015-03-08 NOTE — Progress Notes (Signed)
[facility]  [address]  [address]  [place] [address]  [phone]    Colonoscopy History and Physical    Patient: Aaron Reid MRN: [id number]  SSN: [id number]    Date of Birth: 22-Nov-1955  Age: 59 y.o.  Sex: male      Subjective:      Aaron Reid is a 59 y.o. male who was referred by Dr. Doristine Church for colonoscopy for   Personal history of colon polyps (screening only). His last colon screening was 10 years ago He had 2 benign polyps removed.  This was done in [place].  The patient denies any rectal bleeding, change in bowel habits, weight changes, nor any abdominal pain. He denies constipation, vomiting, diarrhea, bloody stools, mucousy stools, difficulty swallowing, loss of appetite, reflux and nausea.  He does take Amitiza for constipation, which works well for him.  No family hx of colon cancer.      Past Medical History   Diagnosis Date   ??? Arthritis    ??? Back injury    ??? Benign prostatic hyperplasia with lower urinary tract symptoms    ??? Benign prostatic hyperplasia without lower urinary tract symptoms    ??? Cervical fusion syndrome 10/2003   ??? Cervical fusion syndrome 2005   ??? Cervical spondylosis 12/24/2012   ??? Chronic pain 1980     lower back    ??? Chronic pain 1980     lower back   ??? DDD (degenerative disc disease), cervical 12/24/2012   ??? DDD (degenerative disc disease), lumbar 12/24/2012   ??? DJD (degenerative joint disease), multiple sites 12/24/2012   ??? Foraminal stenosis of cervical region 12/24/2012   ??? GAD (generalized anxiety disorder) 12/24/2012   ??? GERD (gastroesophageal reflux disease)    ??? Head injury    ??? Hepatitis C 12/24/2012   ??? Hepatitis C infection    ??? History of neurogenic bladder    ??? Incomplete bladder emptying    ??? Insomnia 12/24/2012   ??? LBP (low back pain) 12/24/2012   ??? Lower urinary tract symptoms (LUTS)    ??? Lumbar spondylosis 12/24/2012   ??? Musculoskeletal pain 12/24/2012   ??? Neck injury    ??? Neck pain 12/24/2012    ??? Neurogenic bladder    ??? Opioid dependence (HCC) 02/21/2013   ??? Perirectal abscess    ??? S/P cervical spinal fusion 12/24/2012   ??? S/P lumbar spinal fusion 12/24/2012   ??? Spasmodic torticollis 12/24/2012   ??? Spondylolisthesis of cervical region 12/24/2012   ??? Urge incontinence    ??? Urinary frequency    ??? Urinary urgency      Past Surgical History   Procedure Laterality Date   ??? Endoscopy, colon, diagnostic     ??? Hx orthopaedic       spinal fusion, lumbar, cervical   ??? Hx orthopaedic       spinal fusion, lumbar cervical      Family History   Problem Relation Age of Onset   ??? Heart Disease Sister    ??? Diabetes Brother    ??? Stroke Brother      Social History   Substance Use Topics   ??? Smoking status: Never Smoker   ??? Smokeless tobacco: Never Used   ??? Alcohol use No      Prior to Admission medications    Medication Sig Start Date End Date Taking? Authorizing Provider   PEG 3350-Electrolytes (COLYTE) 240-22.72-6.72 -5.84 gram solution  Take 4 L by mouth now for 1 dose. Take as directed 03/08/15 03/08/15 Yes Rhea Bleacher, NP   lubiPROStone (AMITIZA) 8 mcg capsule Take 1 Cap by mouth two (2) times daily (with meals) for 30 days. Indications: OPIOID-INDUCED CONSTIPATION 02/09/15 03/11/15  Jairo Ben, MD   buprenorphine-naloxone (SUBOXONE) 8-2 mg film sublingaul film 1 Film by SubLINGual route two (2) times a day for 30 days. Max Daily Amount: 2 Film. Indications: CHRONIC PAIN, OPIOID DEPENDENCE 03/09/15 04/08/15  Jairo Ben, MD   trimethoprim-sulfamethoxazole (BACTRIM, SEPTRA) 80-400 mg per tablet Take 1 Tab by mouth two (2) times a day. 01/29/15   Manual Meier, MD   buprenorphine-naloxone (SUBOXONE) 8-2 mg film sublingaul film by SubLINGual route daily.    Historical Provider   tamsulosin (FLOMAX) 0.4 mg capsule Take 1 Cap by mouth daily (after dinner). 10/05/14   Arlyce Harman, MD   tiZANidine (ZANAFLEX) 2 mg tablet 1-2 every 6 hours as needed for pain 09/01/14   Donzetta Matters, MD    LORazepam (ATIVAN) 0.5 mg tablet Take 1 Tab by mouth every eight (8) hours as needed for Anxiety. 08/07/14   Donzetta Matters, MD   aspirin delayed-release 81 mg tablet Take  by mouth daily.    Historical Provider        Allergies   Allergen Reactions   ??? Latex Rash   ??? Cymbalta [Duloxetine] Anxiety   ??? Duragesic [Fentanyl] Drowsiness     Altered LOC       Review of Systems:  Review of systems performed with findings as noted.    Objective:     Vitals:    03/08/15 1318   BP: 123/84   Pulse: 75   Resp: 16   Temp: 97.8 ??F (36.6 ??C)   Weight: 106.6 kg (235 lb)   Height:  (1.778 m)        Physical Exam:  GENERAL: alert, cooperative, no distress, appears stated age  LUNG: clear to auscultation bilaterally  HEART: regular rate and rhythm  ABDOMEN: soft, non-tender. Bowel sounds normal. No masses,  no organomegaly  NEUROLOGIC: negative  PSYCHIATRIC: non focal    Assessment:   Aaron Reid is a 59 y.o. male who presents for colonoscopy for   Personal history of colon polyps (screening only)    Plan:   1. I recommend proceeding with colonoscopy. The patient was in full agreement and was eager to proceed.    2. I discussed the details of the colonoscopy procedure. The risks of colonoscopy were discussed including colon injury/perforation, anesthesia issues, bleeding, and the possibility of incomplete examination. The patient was willing to accept these risks and proceed with the examination. All questions were answered to the patient's satisfaction.     3. The patient was provided with the instructions in preparation for the colonoscopy procedure including the bowel prep recommendations.               Signed By: Rhea Bleacher, NP     March 08, 2015

## 2015-03-10 ENCOUNTER — Ambulatory Visit: Admit: 2015-03-10 | Discharge: 2015-03-10 | Payer: MEDICARE | Attending: Urology | Primary: Internal Medicine

## 2015-03-10 DIAGNOSIS — N401 Enlarged prostate with lower urinary tract symptoms: Secondary | ICD-10-CM

## 2015-03-10 LAB — AMB POC URINALYSIS DIP STICK AUTO W/O MICRO
Glucose (UA POC): NEGATIVE
Leukocyte esterase (UA POC): NEGATIVE
Nitrites (UA POC): NEGATIVE
Protein (UA POC): NEGATIVE mg/dL
Specific gravity (UA POC): 1.03 (ref 1.001–1.035)
Urobilinogen (UA POC): 0.2 (ref 0.2–1)
pH (UA POC): 5 (ref 4.6–8.0)

## 2015-03-10 LAB — AMB POC PVR, MEAS,POST-VOID RES,US,NON-IMAGING: PVR: 0 cc

## 2015-03-10 MED ORDER — OXYBUTYNIN CHLORIDE SR 10 MG 24 HR TAB
10 mg | ORAL_TABLET | Freq: Every day | ORAL | 3 refills | Status: DC
Start: 2015-03-10 — End: 2015-05-24

## 2015-03-10 NOTE — Progress Notes (Signed)
ASSESSMENT:   1. Benign prostatic hyperplasia with lower urinary tract symptoms, unspecified morphology    2. Perirectal abscess    3. Screening PSA (prostate specific antigen)      1. Moderate irritative LUTS, specifically nocturia, frequency, and urgency with UUI. Initiated on flomax 0.4mg  daily with worsening symptoms subjectively. UDS revealed a small capacity bladder with delayed sensations. He had DO near capacity without leakage. He voided with a high pressure detrusor contraction, low flow, slight abdominal relaxation, relaxed EUS and emptied well. His pressure-flow parameters are in the markedly obstructive range.  2. Incomplete bladder emptying, improved on alpha blocker. ??  3. Screening PSA, 0.7ng/ml on 07/18/13, wnl ??  4. PMH notable for; Hep C, Type II Diabetes controlled with diet and exercise. S/p cervical and lumbar laminectomy in the past     PLAN:    Reviewed possible GU medication and treatment of LUTS  Reviewed UDS from 01/12/2015  Continue Flomax 0.4mg   Oxybutanin XL rx given, side effects discussed  F/u in 3 months with pvr   Will need PSA next year  Patient with plans to follow up with Dr. Nedra Hai for colonoscopy and exam of perirectal abcess    Proscar and Avodart are medications which block a hormone in the prostate gland. By doing so, they cause a decrease in the size of the prostate. The FDA has approved both medications for the treatment of prostatic enlargement and urologists have used both of these drugs with great success for many years to improve urinary function in men and reduce the incidence of urinary infection, urinary retention, and the need for prostate surgery.  Proscar and Avodart have also been shown to decrease the incidence of prostate cancer. Avodart has been shown through clinical trials to delay or perhaps even stop the growth of some prostate cancers. There has been concern that Proscar and Avodart may be associated with a prostate cancer  that is described as high-grade. Although many urologists believe this is not the case and that the benefits of these medications far outweigh the risks, the FDA has included this warning in their label for these two medications.  In the 4-year Reduction by Dutasteride of Prostate Cancer Events (REDUCE) trial, men aged 14 to 68 years with a prior negative biopsy for prostate cancer and a baseline   PSA between 2.5 ng/mL and 10.0 ng/mL taking AVODART were found to have an increased incidence of Gleason score 8-10 (high grade) prostate cancer compared with men taking placebo (AVODART 1.0% versus placebo 0.5%).    In the earlier 7-year long Prostate Cancer Prevention Trial (PCPT), which compared placebo to another 5 alpha-reductase inhibitor (finasteride 5 mg,   PROSCAR), similar results for Gleason score 8-10 prostate cancer was observed (finasteride 1.8% versus placebo 1.1%).    While the differences are small it is important for men taking these drugs or considering taking them to be aware, that they may increase the risk of development of high-grade prostate cancer. Whether the effect of taking either Avodart or Proscar which reduce prostate size, and decrease PSA, or other study factors may have impacted the results of these studies has not been established      Chief Complaint   Patient presents with   ??? Benign Prostatic Hypertrophy       HISTORY OF PRESENT ILLNESS:  Aaron Reid is a 59 y.o. male presents today for follow up of LUTS. Patient is s/p Cysto on 01/29/2015 which revealed 1+ trabeculation and trilobar hypertrophy. TRUS  volume 57cc.  Patient is doing well. No new voiding complaints today.  No f/n/c/v. Patient continues to have urinary urgency, reports nocturia x2-3. Still taking Flomax 0.4mg . Denies gross hematuria, dysuria. Patient notes some improvement to small perirectal area. Reports minimal benefit from bactrim. No drainage. No erythema.    States mostly dissatisfied with  urinary urgency but also some issues with difficultly with stream.  PVR today is 0cc.  Patient interested in inquiring about finasteride.            UDS 01/12/2015  Micturition          Voided Volume: 104 ml    Detrusor Pressure: At peak flow rate: 106 cm H2O Maximum: 110 cm H2O   Duration Contraction: > 1 minute continuous ??   Post Void Contraction: no ??   Valsalva: No, slight abdominal relaxation   External Sphincter: relaxed   Max Flow Rate: 7.8 ml/sec   Average Flow Rate 4.2 ml/sec   Voiding Time: 25.4 seconds   Flow Pattern: continuous   Post Void Residual: n/a   Comments: UDS with patient on Flomax. He has a small capacity bladder with delayed sensations. He had DO near capacity without leakage. He voided with a high pressure detrusor contraction, low flow, slight abdominal relaxation, relaxed EUS and emptied well. His pressure-flow parameters are in the markedly obstructive range.         AUA Symptom Score 03/10/2015   Over the past month how often have you had the sensation that your bladder was not completely empty after you finished urinating? 0   Over the past month, how often have had to urinate again less than 2 hours after you last finished urinating? 0   Over the past month, how often have you found you stopped and started again several times when you urinated? 0   Over the past month, how often have you found it difficult to postpone urination? 5   Over the past month, how often have you had a weak urinary stream? 0   Over the past month, how often have you had to push or strain to begin urinating? 5   Over the past month, how many times did you most typically get up to urinate from the time you went to bed at night until the time you got up in the morning? 3   AUA Score 13   If you were to spend the rest of your life with your urinary condition the way it is now, how would you feel about that? Mostly dissatisfied         Past Medical History   Diagnosis Date   ??? Arthritis    ??? Back injury     ??? Benign prostatic hyperplasia with lower urinary tract symptoms    ??? Benign prostatic hyperplasia without lower urinary tract symptoms    ??? Cervical fusion syndrome 10/2003   ??? Cervical fusion syndrome 2005   ??? Cervical spondylosis 12/24/2012   ??? Chronic pain 1980     lower back    ??? Chronic pain 1980     lower back   ??? DDD (degenerative disc disease), cervical 12/24/2012   ??? DDD (degenerative disc disease), lumbar 12/24/2012   ??? DJD (degenerative joint disease), multiple sites 12/24/2012   ??? Foraminal stenosis of cervical region 12/24/2012   ??? GAD (generalized anxiety disorder) 12/24/2012   ??? GERD (gastroesophageal reflux disease)    ??? Head injury    ??? Hepatitis C 12/24/2012   ???  Hepatitis C infection    ??? History of neurogenic bladder    ??? Incomplete bladder emptying    ??? Insomnia 12/24/2012   ??? LBP (low back pain) 12/24/2012   ??? Lower urinary tract symptoms (LUTS)    ??? Lumbar spondylosis 12/24/2012   ??? Musculoskeletal pain 12/24/2012   ??? Neck injury    ??? Neck pain 12/24/2012   ??? Neurogenic bladder    ??? Opioid dependence (HCC) 02/21/2013   ??? Perirectal abscess    ??? S/P cervical spinal fusion 12/24/2012   ??? S/P lumbar spinal fusion 12/24/2012   ??? Spasmodic torticollis 12/24/2012   ??? Spondylolisthesis of cervical region 12/24/2012   ??? Urge incontinence    ??? Urinary frequency    ??? Urinary urgency    ??? UTI (urinary tract infection)        Past Surgical History   Procedure Laterality Date   ??? Endoscopy, colon, diagnostic     ??? Hx orthopaedic       spinal fusion, lumbar, cervical   ??? Hx orthopaedic       spinal fusion, lumbar cervical   ??? Hx back surgery         Social History   Substance Use Topics   ??? Smoking status: Never Smoker   ??? Smokeless tobacco: Never Used   ??? Alcohol use No       Allergies   Allergen Reactions   ??? Latex Rash   ??? Cymbalta [Duloxetine] Anxiety   ??? Duragesic [Fentanyl] Drowsiness     Altered LOC       Family History   Problem Relation Age of Onset   ??? Heart Disease Sister    ??? Diabetes Brother     ??? Stroke Brother        Current Outpatient Prescriptions   Medication Sig Dispense Refill   ??? oxybutynin chloride XL (DITROPAN XL) 10 mg CR tablet Take 1 Tab by mouth daily. 90 Tab 3   ??? lubiPROStone (AMITIZA) 8 mcg capsule Take 1 Cap by mouth two (2) times daily (with meals) for 30 days. Indications: OPIOID-INDUCED CONSTIPATION 60 Cap 5   ??? buprenorphine-naloxone (SUBOXONE) 8-2 mg film sublingaul film by SubLINGual route daily.     ??? tamsulosin (FLOMAX) 0.4 mg capsule Take 1 Cap by mouth daily (after dinner). 30 Cap 5   ??? tiZANidine (ZANAFLEX) 2 mg tablet 1-2 every 6 hours as needed for pain 60 Tab 3   ??? LORazepam (ATIVAN) 0.5 mg tablet Take 1 Tab by mouth every eight (8) hours as needed for Anxiety. 30 Tab 1   ??? aspirin delayed-release 81 mg tablet Take  by mouth daily.         Review of Systems  Constitutional: Fever: No  Skin: Rash: No  HEENT: Hearing difficulty: No  Eyes: Blurred vision: No  Cardiovascular: Chest pain: No  Respiratory: Shortness of breath: No  Gastrointestinal: Nausea/vomiting: No  Musculoskeletal: Back pain: Yes  Neurological: Weakness: No  Psychological: Memory loss: No  Comments/additional findings:         PHYSICAL EXAMINATION:   Visit Vitals   ??? BP 118/82   ??? Ht 5\' 10"  (1.778 m)   ??? Wt 235 lb (106.6 kg)   ??? BMI 33.72 kg/m2     Constitutional: WDWN, Pleasant and appropriate affect, No acute distress.    CV:  No peripheral swelling noted  Respiratory: No respiratory distress or difficulties  Abdomen:  No abdominal masses or tenderness. No CVA tenderness. No  inguinal hernias noted.   GU Male: perirectal abscess with mild fluctuance on the R side, no erythema, minimal tenderness, no drainage  Skin: No jaundice.    Neuro/Psych:  Alert and oriented x 3, affect appropriate.   Lymphatic:   No enlarged inguinal lymph nodes.        REVIEW OF LABS AND IMAGING:    Results for orders placed or performed in visit on 03/10/15   AMB POC PVR, MEAS,POST-VOID RES,US,NON-IMAGING   Result Value Ref Range     PVR 0 cc   AMB POC URINALYSIS DIP STICK AUTO W/O MICRO   Result Value Ref Range    Color (UA POC) Yellow     Clarity (UA POC) Clear     Glucose (UA POC) Negative Negative    Bilirubin (UA POC) 1+ Negative    Ketones (UA POC) Trace Negative    Specific gravity (UA POC) 1.030 1.001 - 1.035    Blood (UA POC) Trace Negative    pH (UA POC) 5.0 4.6 - 8.0    Protein (UA POC) Negative Negative mg/dL    Urobilinogen (UA POC) 0.2 mg/dL 0.2 - 1    Nitrites (UA POC) Negative Negative    Leukocyte esterase (UA POC) Negative Negative         A copy of today's office visit with all pertinent imaging results and labs were sent to the referring physician.        Manual Meier, MD     This chart was documented with the assistance of Wonda Horner, medical scribe for Manual Meier, MD on 03/10/2015.

## 2015-03-15 LAB — HM COLONOSCOPY

## 2015-03-15 NOTE — Progress Notes (Signed)
Colonoscopy Procedure Note      Aaron Reid  Apr 24, 1956  [id number]                Date of Procedure:  03/15/2015    Indications:    Personal history of colon polyps (screening only)     Preoperative diagnosis:   Personal history of colon polyps (screening only)  (High risk for colon carcinoma)    Postoperative diagnosis: Normal colonoscopy exam    Title of Procedure:  Colonoscopy, screening    Operator:  Estil Daft, MD    Referring Source:  Donzetta Matters, MD    Sedation:   MAC    ASA Class: ASA 3 - Severe systemic disease but compensated        Procedure Details:    Prior to the procedure, a history and physical were performed. The patient???s medications, allergies and sensitivities were reviewed and all questions were answered.  After informed consent was obtained for the procedure, with all risks and benefits of procedure explained. The patient was taken to the endoscopy suite and placed in the left lateral decubitus position.  Patient identification and proposed procedure were verified prior to the procedure by the nurse and I.    Following the  satisfactory administration of sedation,  the anus was inspected and appeared within normal limits.  Digital rectal examination revealed Normal sphincter tone and squeeze pressure.  Palpation revealed No Masses.    Next the Olympus video colonoscope was introduced through the anus and advanced to cecum, which was identified by the ileocecal valve and appendiceal orifice, terminal ileum.  The quality of preparation was fair.  The terminal ileum was visualized.  The colonoscope was then slowly withdrawn and the mucosa carefully examined for any abnormalities.  Cecal withdrawl time was greater than six minutes. The patient tolerated the procedure well.      Findings:   Rectum: normal  Sigmoid: normal  Descending Colon: normal  Transverse Colon: normal  Ascending Colon: normal  Cecum: normal  Terminal Ileum: normal    Interventions:  none    Specimen Removed:  None       Complications: None.     EBL:  None.    Impression:  Normal colonoscopy exam    Recommendations: -Repeat colonoscopy in 5 years.   Resume normal medication(s).     Discharge Disposition:  Home in the company of a driver when able to ambulate.        Estil Daft, MD, FACS, FASCRS  Colon and Rectal Surgery  [facility]  Office [phone]  Fax     [phone]  03/15/2015  9:39 AM       [facility]  [address], [address]  [place], [place] [address]

## 2015-03-19 ENCOUNTER — Encounter: Attending: Urology | Primary: Internal Medicine

## 2015-03-23 ENCOUNTER — Ambulatory Visit: Admit: 2015-03-23 | Payer: MEDICARE | Attending: Internal Medicine | Primary: Internal Medicine

## 2015-03-23 DIAGNOSIS — J301 Allergic rhinitis due to pollen: Secondary | ICD-10-CM

## 2015-03-23 MED ORDER — LORATADINE 10 MG TAB
10 mg | ORAL_TABLET | Freq: Every day | ORAL | 0 refills | Status: AC
Start: 2015-03-23 — End: ?

## 2015-03-23 MED ORDER — FLUTICASONE 50 MCG/ACTUATION NASAL SPRAY, SUSP
50 mcg/actuation | Freq: Every day | NASAL | 3 refills | Status: AC
Start: 2015-03-23 — End: ?

## 2015-03-23 NOTE — Progress Notes (Signed)
Ceasar LundLeon Gregory Longan is a 59 y.o. male c/o sinus pressure behind eyes, skin problem, and back pain. Pain scale 6/10      1. Have you been to the ER, urgent care clinic since your last visit?  Hospitalized since your last visit?No    2. Have you seen or consulted any other health care providers outside of the [facility] since your last visit?  Include any pap smears or colon screening. No

## 2015-03-23 NOTE — Addendum Note (Signed)
Addended by: Donzetta MattersSALSANO, Baylon Santelli on: 03/23/2015 01:33 PM      Modules accepted: Orders

## 2015-03-23 NOTE — Progress Notes (Signed)
HISTORY OF PRESENT ILLNESS  Aaron Reid is a 59 y.o. male.  HPI  Sinus congestion  Review of Systems   HENT: Positive for congestion.    All other systems reviewed and are negative.    Past Medical History   Diagnosis Date   ??? Arthritis    ??? Back injury    ??? Benign prostatic hyperplasia with lower urinary tract symptoms    ??? Benign prostatic hyperplasia without lower urinary tract symptoms    ??? Cervical fusion syndrome 10/2003   ??? Cervical fusion syndrome 2005   ??? Cervical spondylosis 12/24/2012   ??? Chronic pain 1980     lower back    ??? Chronic pain 1980     lower back   ??? DDD (degenerative disc disease), cervical 12/24/2012   ??? DDD (degenerative disc disease), lumbar 12/24/2012   ??? DJD (degenerative joint disease), multiple sites 12/24/2012   ??? Foraminal stenosis of cervical region 12/24/2012   ??? GAD (generalized anxiety disorder) 12/24/2012   ??? GERD (gastroesophageal reflux disease)    ??? Head injury    ??? Hepatitis C 12/24/2012   ??? Hepatitis C infection    ??? History of neurogenic bladder    ??? Incomplete bladder emptying    ??? Insomnia 12/24/2012   ??? LBP (low back pain) 12/24/2012   ??? Lower urinary tract symptoms (LUTS)    ??? Lumbar spondylosis 12/24/2012   ??? Musculoskeletal pain 12/24/2012   ??? Neck injury    ??? Neck pain 12/24/2012   ??? Neurogenic bladder    ??? Opioid dependence (HCC) 02/21/2013   ??? Perirectal abscess    ??? S/P cervical spinal fusion 12/24/2012   ??? S/P lumbar spinal fusion 12/24/2012   ??? Spasmodic torticollis 12/24/2012   ??? Spondylolisthesis of cervical region 12/24/2012   ??? Urge incontinence    ??? Urinary frequency    ??? Urinary urgency    ??? UTI (urinary tract infection)      Current Outpatient Prescriptions on File Prior to Visit   Medication Sig Dispense Refill   ??? oxybutynin chloride XL (DITROPAN XL) 10 mg CR tablet Take 1 Tab by mouth daily. 90 Tab 3   ??? buprenorphine-naloxone (SUBOXONE) 8-2 mg film sublingaul film by SubLINGual route daily.      ??? tamsulosin (FLOMAX) 0.4 mg capsule Take 1 Cap by mouth daily (after dinner). 30 Cap 5   ??? tiZANidine (ZANAFLEX) 2 mg tablet 1-2 every 6 hours as needed for pain 60 Tab 3   ??? LORazepam (ATIVAN) 0.5 mg tablet Take 1 Tab by mouth every eight (8) hours as needed for Anxiety. 30 Tab 1   ??? aspirin delayed-release 81 mg tablet Take  by mouth daily.       No current facility-administered medications on file prior to visit.      Visit Vitals   ??? BP 115/80 (BP 1 Location: Right arm, BP Patient Position: Sitting)   ??? Pulse 67   ??? Temp 97.6 ??F (36.4 ??C) (Oral)   ??? Resp 20   ??? Ht 5\' 10"  (1.778 m)   ??? Wt 235 lb (106.6 kg)   ??? SpO2 96%   ??? BMI 33.72 kg/m2         Physical Exam   Constitutional: He appears well-developed and well-nourished. No distress.   HENT:   Head: Normocephalic and atraumatic.   Right Ear: External ear normal.   Left Ear: External ear normal.   + pharyngeal inflammation  + nasal  allergic swelling   Skin: He is not diaphoretic.   Vitals reviewed.    Reviewed colonoscopy   ASSESSMENT and PLAN  allergic rhinitis   Plan  flonase/clarinex

## 2015-04-14 ENCOUNTER — Ambulatory Visit: Admit: 2015-04-14 | Discharge: 2015-04-14 | Payer: MEDICARE | Attending: Gastroenterology | Primary: Internal Medicine

## 2015-04-14 ENCOUNTER — Inpatient Hospital Stay: Admit: 2015-04-14 | Payer: MEDICARE | Primary: Internal Medicine

## 2015-04-14 DIAGNOSIS — B182 Chronic viral hepatitis C: Secondary | ICD-10-CM

## 2015-04-14 LAB — CBC WITH AUTOMATED DIFF
ABS. BASOPHILS: 0 10*3/uL (ref 0.0–0.06)
ABS. EOSINOPHILS: 0 10*3/uL (ref 0.0–0.4)
ABS. LYMPHOCYTES: 2.2 10*3/uL (ref 0.9–3.6)
ABS. MONOCYTES: 0.3 10*3/uL (ref 0.05–1.2)
ABS. NEUTROPHILS: 2 10*3/uL (ref 1.8–8.0)
BASOPHILS: 0 % (ref 0–2)
EOSINOPHILS: 1 % (ref 0–5)
HCT: 45.4 % (ref 36.0–48.0)
HGB: 14.9 g/dL (ref 13.0–16.0)
LYMPHOCYTES: 47 % (ref 21–52)
MCH: 29.4 PG (ref 24.0–34.0)
MCHC: 32.8 g/dL (ref 31.0–37.0)
MCV: 89.5 FL (ref 74.0–97.0)
MONOCYTES: 7 % (ref 3–10)
MPV: 12.2 FL — ABNORMAL HIGH (ref 9.2–11.8)
NEUTROPHILS: 45 % (ref 40–73)
PLATELET: 180 10*3/uL (ref 135–420)
RBC: 5.07 M/uL (ref 4.70–5.50)
RDW: 12.5 % (ref 11.6–14.5)
WBC: 4.6 10*3/uL (ref 4.6–13.2)

## 2015-04-14 NOTE — Progress Notes (Signed)
[name], MD, FACP, [name]     [name], NP     [name], PA-C      [name], MD, FACP, [facility]     [name], MD     [name], NP     [name], NP        [name], [name]     [phone]     FAX: [name]     [name], [name]     [phone]     FAX: [phone]       Patient Care Team:  [name], MD as PCP - General (Internal Medicine)  [name], MD (Gastroenterology)  [name], MD (Internal Medicine)  [name], RN as Ambulatory Care Navigator  [name], NP (Nurse Practitioner)  [name], MD (Colon and Rectal Surgery)      Problem List  Date Reviewed: 04/29/2015          Codes Class Noted    BPH (benign prostatic hypertrophy) ICD-10-CM: N40.0  ICD-9-CM: 600.00  Unknown        Chronic hepatitis C ([place]) ICD-10-CM: B18.2  ICD-9-CM: 070.54  Unknown        S/P lumbar spinal fusion ICD-10-CM: Z98.1  ICD-9-CM: V45.4  12/24/2012        S/P cervical spinal fusion ICD-10-CM: Z98.1  ICD-9-CM: V45.4  12/24/2012        GERD (gastroesophageal reflux disease) ICD-10-CM: K21.9  ICD-9-CM: 530.81  12/24/2012        Chronic pain ICD-10-CM: G89.29  ICD-9-CM: 338.29  06[birth year]/1978    Overview Signed 03/10/2015  2:37 PM by Sonia Side     lower back                    Aaron Reid returns to the [facility] [place] for management of chronic HCV.  The active problem list, all pertinent past medical history, medications, radiologic findings and laboratory findings related to the liver disorder were reviewed with the patient.      The patient was last seen in 11/2012.    The patient is a 59 y.o. Black male who tested positive for chronic HCV in 2011 at [facility].    Imaging of the liver was not recently performed.       A liver biopsy was performed in ~2000 at [facility].   The results are not available.  The patient reports this demonstrated mild scaring.    The patient has never received treatment for chronic HCV.      The most recent laboratory studies indicate that the liver transaminases are normal, ALP is normal, tests of hepatic synthetic and metabolic function are normal, and the platelet count is normal.    The patient notes fatigue, pain in the right side over the liver,     The patient has not experienced problems concentrating, swelling of the abdomen, swelling of the lower extremities, hematemesis, hematochezia.    The patient completes all daily activities without any functional limitations.      ALLERGIES  Allergies   Allergen Reactions   ??? Latex Rash   ??? Cymbalta [  Duloxetine] Anxiety   ??? Duragesic [Fentanyl] Drowsiness     Altered LOC       MEDICATIONS  Current Outpatient Prescriptions   Medication Sig   ??? loratadine (CLARITIN) 10 mg tablet Take 1 Tab by mouth daily.   ??? fluticasone (FLONASE) 50 mcg/actuation nasal spray 2 Sprays by Both Nostrils route daily. 1 spray in each nostril every am   ??? oxybutynin chloride XL (DITROPAN XL) 10 mg CR tablet Take 1 Tab by mouth daily.   ??? buprenorphine-naloxone (SUBOXONE) 8-2 mg film sublingaul film by SubLINGual route daily.   ??? tamsulosin (FLOMAX) 0.4 mg capsule Take 1 Cap by mouth daily (after dinner).   ??? tiZANidine (ZANAFLEX) 2 mg tablet 1-2 every 6 hours as needed for pain   ??? LORazepam (ATIVAN) 0.5 mg tablet Take 1 Tab by mouth every eight (8) hours as needed for Anxiety.   ??? aspirin delayed-release 81 mg tablet Take  by mouth daily.     No current facility-administered medications for this visit.        SYSTEM REVIEW NOT RELATED TO LIVER DISEASE OR REVIEWED ABOVE:  Constitution systems: Negative for fever, chills, weight gain, weight loss.   Eyes: Negative for visual changes.  ENT: Negative for sore throat, painful swallowing.    Respiratory: Negative for cough, hemoptysis, SOB.   Cardiology: Negative for chest pain, palpitations.  GI:  Negative for constipation or diarrhea.  GU: Negative for urinary frequency, dysuria, hematuria, nocturia.   Skin: Negative for rash.  Hematology: Negative for easy bruising, blood clots.    Musculo-skelatal: Chronic back pain.  Neurologic: Negative for headaches, dizziness, vertigo, memory problems not related to HE.  Psychology: Negative for anxiety, depression.       FAMILY HISTORY:  The father died of accident.    The mother died of natural causes.    There are no other persons in the family with HCV or liver disease.       SOCIAL HISTORY:  The patient is divorced.    The patient has 4 children, and 6 grandchildren.    The patient has never used tobacco products.    The patient has never consumed significant amounts of alcohol.    The patient used to work in [employer].  Currently a full time student studying mental health counceling.        PHYSICAL EXAMINATION:  Visit Vitals   ??? BP 120/78 (BP 1 Location: Right arm, BP Patient Position: At rest)   ??? Pulse 71   ??? Temp 97.1 ??F (36.2 ??C) (Tympanic)   ??? Resp 16   ??? Ht 5' 10" (1.778 m)   ??? Wt 223 lb (101.2 kg)   ??? SpO2 97%   ??? BMI 32 kg/m2     General: No acute distress.   Eyes: Sclera anicteric.   ENT: No oral lesions.  Thyroid normal.  Nodes: No adenopathy.   Skin: No spider angiomata.  No jaundice.  No palmar erythema.  Respiratory: Lungs clear to auscultation.   Cardiovascular: Regular heart rate.  No murmurs.  No JVD.  Abdomen: Soft non-tender.  Liver size normal to percussion/palpation.  Spleen not palpable. No obvious ascites.  Extremities: No edema.  No muscle wasting.  No gross arthritic changes.  Neurologic: Alert and oriented.  Cranial nerves grossly intact.  No asterixsis.    LABORATORY STUDIES:  [facility] 04/14/2015 02/18/2015 09/10/2014   WBC 4.6 - 13.2 K/uL 4.6 4.1 (L) 4.8  ANC 1.8 - 8.0 K/UL 2.0 2.0 1.8   HGB 13.0 - 16.0 g/dL 14.9 16.1 (H) 13.8   PLT 135 - 420 K/uL 180 183 145 (L)   INR 0.8 - 1.2    1.1    AST 15 - 37 U/L 39 (H)  34   ALT 16 - 61 U/L 48  35   Alk Phos 45 - 117 U/L 63  59   Bili, Total 0.2 - 1.0 MG/DL 0.5  0.6   Bili, Direct 0.0 - 0.2 MG/DL 0.2     Albumin 3.4 - 5.0 g/dL 3.8  3.9   BUN 7.0 - 18 MG/DL _0 Creat 0.6 - 1.3 MG/DL 0.98 1.12 0.88   Na 136 - 145 mmol/L 142 138 146 (H)   K 3.5 - 5.5 mmol/L 4.6 4.8 4.5   Cl 100 - 108 mmol/L 104 102 104   CO2 21 - 32 mmol/L 33 (H) 28 27   Glucose 74 - 99 mg/dL 128 (H) 118 (H) 95       SEROLOGIES:  Serologies Latest Ref Rng 11/12/2012   Hep A Ab, Total Negative Negative   Hep B Surface Ag Negative Negative   Hep B Core Ab, Total Negative Positive (A)   Hep C Genotype See Note 1b     Anti-HBsurface positive, HCV RNA Log 6.2 IU/ml    LIVER HISTOLOGY:  Not available or performed    ENDOSCOPIC PROCEDURES:  Not available or performed    RADIOLOGY:  Not available or performed    OTHER TESTING:  Not available or performed    ASSESSMENT AND PLAN:  Chronic HCV of unclear severity.    Have performed laboratory testing to monitor liver function and degree of liver injury.  This included BMP, Hepatic panel, CBC with platelet count.      Liver function is normal.  The platelet count is normal.      The need to assess liver fibrosis was discussed.  Sheer wave elastography can assess liver fibrosis and determine if a patient has advanced fibrosis or cirrhosis without the need for liver biopsy.  Sheer wave elastography is now available at [facility].  This will be scheduled.    The patient has not previously been treated for HCV.      The patient has HCV genotype 1B.    Discussed the treatment alternatives.  The SVR/cure rate for HCV now exceeds 90% with just oral anti-viral therapy and no interferon injections or significant side effects for most patients with HCV.  The specific  treatment is dependent upon genotype, viral load and histology.      The patient should be treated with Harvoni (sofasbuvir and ledipasvir), Rozann Lesches (PrOD), Zepetier (Grazaprevir and [employer]negy).  The SVR/cure rate for is over 95%.    The patient was directed to continue all current medications at the current dosages.  There are no contraindications for the patient to take any medications that are necessary for treatment of other medical issues.    The patient was counseled regarding alcohol consumption.      Vaccination for viral hepatitis A is recommended since the patient has no serologic evidence of previous exposure or vaccination with immunity.    Vaccination for viral hepatitis B is not required.  The patient has serologic evidence of prior exposure or vaccination with immunity.    All of the above issues were discussed with the patient.  All questions were answered.  The patient expressed  a clear understanding of the above.    [place] in 1 month to initiate HCV treatment.    [name], MD  [facility], [place]  [address]  [phone]

## 2015-04-15 LAB — HEPATIC FUNCTION PANEL
A-G Ratio: 0.9 (ref 0.8–1.7)
ALT (SGPT): 48 U/L (ref 16–61)
AST (SGOT): 39 U/L — ABNORMAL HIGH (ref 15–37)
Albumin: 3.8 g/dL (ref 3.4–5.0)
Alk. phosphatase: 63 U/L (ref 45–117)
Bilirubin, direct: 0.2 MG/DL (ref 0.0–0.2)
Bilirubin, total: 0.5 MG/DL (ref 0.2–1.0)
Globulin: 4.3 g/dL — ABNORMAL HIGH (ref 2.0–4.0)
Protein, total: 8.1 g/dL (ref 6.4–8.2)

## 2015-04-15 LAB — METABOLIC PANEL, BASIC
Anion gap: 5 mmol/L (ref 3.0–18)
BUN/Creatinine ratio: 16 (ref 12–20)
BUN: 16 MG/DL (ref 7.0–18)
CO2: 33 mmol/L — ABNORMAL HIGH (ref 21–32)
Calcium: 8.8 MG/DL (ref 8.5–10.1)
Chloride: 104 mmol/L (ref 100–108)
Creatinine: 0.98 MG/DL (ref 0.6–1.3)
GFR est AA: >60 mL/min/{1.73_m2} (ref >60)
GFR est non-AA: >60 mL/min/{1.73_m2} (ref >60)
Glucose: 128 mg/dL — ABNORMAL HIGH (ref 74–99)
Potassium: 4.6 mmol/L (ref 3.5–5.5)
Sodium: 142 mmol/L (ref 136–145)

## 2015-04-16 LAB — HCV REAL-TIME, PCR, QUANT
Hepatitis C Quantitation: 1330000 IU/mL
Hepatitis C log 10: 6.124 log10 IU/mL

## 2015-04-16 LAB — HCV RNA BY NAA QL,RFLX TO QT: HCV RNA by NAA, QL: POSITIVE — AB

## 2015-04-21 ENCOUNTER — Inpatient Hospital Stay: Admit: 2015-04-21 | Payer: MEDICARE | Attending: Gastroenterology | Primary: Internal Medicine

## 2015-04-21 DIAGNOSIS — B182 Chronic viral hepatitis C: Secondary | ICD-10-CM

## 2015-04-26 MED ORDER — TAMSULOSIN SR 0.4 MG 24 HR CAP
0.4 mg | ORAL_CAPSULE | ORAL | 0 refills | Status: AC
Start: 2015-04-26 — End: ?

## 2015-04-26 NOTE — Telephone Encounter (Signed)
Pharmacy faxed refill request for tamsulosin. Pt is being seen every 3 months,was last seen on 03/10/15.  Per last office note patient is to continue taking the above medication.

## 2015-04-27 ENCOUNTER — Encounter: Attending: Gastroenterology | Primary: Internal Medicine

## 2015-05-05 MED ORDER — TAMSULOSIN SR 0.4 MG 24 HR CAP
0.4 mg | ORAL_CAPSULE | Freq: Every day | ORAL | 3 refills | Status: DC
Start: 2015-05-05 — End: 2015-05-07

## 2015-05-05 NOTE — Addendum Note (Signed)
Addended byTonia Brooms: Khalie Wince, TAJ-UN-DIN on: 05/05/2015 11:14 AM      Modules accepted: Orders

## 2015-05-05 NOTE — Addendum Note (Signed)
Addended by: Loleta BooksWALKER, Arelie Kuzel on: 05/05/2015 11:13 AM      Modules accepted: Orders

## 2015-05-07 ENCOUNTER — Ambulatory Visit
Admit: 2015-05-07 | Discharge: 2015-05-07 | Payer: PRIVATE HEALTH INSURANCE | Attending: Pain Medicine | Primary: Internal Medicine

## 2015-05-07 ENCOUNTER — Ambulatory Visit: Attending: Pain Medicine | Primary: Internal Medicine

## 2015-05-07 DIAGNOSIS — M961 Postlaminectomy syndrome, not elsewhere classified: Secondary | ICD-10-CM

## 2015-05-07 MED ORDER — BUPRENORPHINE-NALOXONE 8 MG-2 MG SUBLINGUAL FILM
8-2 mg | ORAL_FILM | Freq: Two times a day (BID) | SUBLINGUAL | 2 refills | Status: AC
Start: 2015-05-07 — End: 2015-06-25

## 2015-05-07 NOTE — Progress Notes (Signed)
HISTORY OF PRESENT ILLNESS  Aaron Reid is a 59 y.o. male.  HPI he returns for follow-up of chronic, severe pain involving the cervical and lumbar spine related to a post cervical and post lumbar laminectomy pain syndrome.  In addition he suffers from generalized osteoarthritis and benign prostatic hypertrophy as well as underlying hepatitis C for which he is about to begin treatment with harvoni.  Pain has been under excellent control, averaging 3-4/10 with 70% overall relief.  Pain level today 4/10, outcome 8/28,(The lower the upper number, the better the outcome)  Physical activity and mobility are very good, mood is good, sleep is fair.  Mild constipation reported.  The opioid-induced constipation protocol is reviewed and suggestions implemented.        Review of Systems   Constitutional: Positive for malaise/fatigue.   Gastrointestinal: Positive for constipation, heartburn and nausea.   Musculoskeletal: Positive for back pain, joint pain (polyarticular), myalgias and neck pain.   Neurological: Positive for weakness (generalized).   Psychiatric/Behavioral: The patient has insomnia.    All other systems reviewed and are negative.      Physical Exam   Constitutional: He is oriented to person, place, and time. He appears distressed.   HENT:   Head: Normocephalic and atraumatic.   Right Ear: External ear normal.   Left Ear: External ear normal.   Nose: Nose normal.   Mouth/Throat: Oropharynx is clear and moist. No oropharyngeal exudate.   Eyes: Conjunctivae and EOM are normal. Pupils are equal, round, and reactive to light. Right eye exhibits no discharge. Left eye exhibits no discharge. No scleral icterus.   Neck: Spinous process tenderness and muscular tenderness (spasms) present. Decreased range of motion present. No thyromegaly present.   Cardiovascular: Normal rate, regular rhythm and normal heart sounds.    Pulmonary/Chest: Effort normal and breath sounds normal. No respiratory  distress. He has no wheezes. He has no rales.   Abdominal: Soft. He exhibits no distension. There is no tenderness. There is no rebound and no guarding.   Musculoskeletal: He exhibits tenderness.        Right shoulder: He exhibits decreased range of motion, tenderness and pain.        Left shoulder: He exhibits decreased range of motion, tenderness and pain.        Right elbow: He exhibits decreased range of motion. Tenderness (diffuse) found.        Left elbow: He exhibits decreased range of motion. Tenderness (crepitus) found.        Right wrist: He exhibits decreased range of motion, tenderness, bony tenderness and crepitus.        Left wrist: He exhibits decreased range of motion, tenderness, bony tenderness and crepitus.        Right knee: He exhibits decreased range of motion (crepitus) and bony tenderness.        Left knee: He exhibits decreased range of motion (crepitus) and bony tenderness.        Right ankle: He exhibits decreased range of motion. Tenderness.        Left ankle: He exhibits decreased range of motion. Tenderness.        Lumbar back: He exhibits decreased range of motion, tenderness, pain and spasm.   Neurological: He is alert and oriented to person, place, and time. He has normal reflexes. No cranial nerve deficit or sensory deficit. He exhibits normal muscle tone. Gait abnormal. Coordination normal.   Reflex Scores:       Tricep reflexes are  2+ on the right side and 2+ on the left side.       Bicep reflexes are 2+ on the right side and 2+ on the left side.       Brachioradialis reflexes are 2+ on the right side and 2+ on the left side.       Patellar reflexes are 2+ on the right side and 2+ on the left side.       Achilles reflexes are 2+ on the right side and 2+ on the left side.  Skin: Skin is warm. No rash noted.   Psychiatric: He has a normal mood and affect. His behavior is normal. Judgment and thought content normal.   Nursing note and vitals reviewed.      ASSESSMENT and PLAN   Encounter Diagnoses   Name Primary?   ??? Cervical post-laminectomy syndrome Yes   ??? Chronic pain syndrome    ??? Lumbar post-laminectomy syndrome    ??? Encounter for long-term (current) use of high-risk medication      He will continue on his current analgesic regimen as this is providing excellent pain control with improve functionality and minimal side effects.  3 month reassess him    No concerns are raised for misuse, abuse, or diversion.    1. Pain medications are prescribed with the objective of pain relief and improved physical and psychosocial function in this patient.  2. Counseled patient on proper use of prescribed medications and reviewed opioid contract.  3. Counseled patient about chronic medical conditions and their relationship to anxiety and depression and recommended mental health support as needed.  4. Reviewed with patient self-help tools, home exercise, and lifestyle changes to assist the patient in self-management of symptoms.  5. Advised patient to have a primary care provider to continue care for health maintenance and general medical conditions and support for referral to specialty care as needed.  6. Reviewed with patient the treatment plan, goals of treatment plan, and limitations of treatment plan, to include the potential for side effects from medications and procedures. If side effects occur, it is the responsibility of the patient to inform the clinic so that a change in the treatment plan can be made in a safe manner. The patient is advised that stopping prescribed medication may cause an increase in symptoms and possible medication withdrawal symptoms. The patient is informed an emergency room evaluation may be necessary if this occurs.  DISPOSITION: The patient???s condition and plan were discussed at length and all questions were answered. The patient agrees with the plan.    Counseling occupied > 50% of visit:  Total time: 30 minutes

## 2015-05-07 NOTE — Progress Notes (Signed)
Nursing Notes    Patient presents to the office today in follow-up.   Patient rates his pain at 4/10 on the numerical pain scale.     Reviewed medications with counts as follows:    Rx Date filled Qty Dispensed Pill Count Last Dose Short   suboxone 8mg   04/28/15 60 46 05/07/15 no         Comments:     POC UDS was not performed in office today    Any new labs or imaging since last appointment? US of pelvis, blood work as well, blood work to check HepC    Have you been to an emergency room (ER) or urgent care clinic since your last visit?  NO            Have you been hospitalized since your last visit? NO     If yes, where, when, and reason for visit?     Have you seen or consulted any other health care providers outside of the [facility]  since your last visit?  NO     If yes, where, when, and reason for visit?     Mr. Hanover RidingBurgess has a reminder for a "due or due soon" health maintenance. I have asked that he contact his primary care provider for follow-up on this health maintenance.

## 2015-05-07 NOTE — Patient Instructions (Signed)
Current health maintenance issues were reviewed and the patient was advised to followup with his/her PCP for completion of these items.

## 2015-05-17 ENCOUNTER — Encounter: Attending: Family | Primary: Internal Medicine

## 2015-05-21 ENCOUNTER — Ambulatory Visit: Admit: 2015-05-21 | Payer: MEDICARE | Attending: Nurse Practitioner | Primary: Internal Medicine

## 2015-05-21 DIAGNOSIS — J0100 Acute maxillary sinusitis, unspecified: Secondary | ICD-10-CM

## 2015-05-21 MED ORDER — AMOXICILLIN 500 MG CAP
500 mg | ORAL_CAPSULE | Freq: Three times a day (TID) | ORAL | 0 refills | Status: AC
Start: 2015-05-21 — End: 2015-05-28

## 2015-05-21 NOTE — Progress Notes (Signed)
HISTORY OF PRESENT ILLNESS  Aaron Reid is a 59 y.o. male.  HPI Acute onset of sinus pain and pressure with colored nasal draingage for more than 5-6 days unresponsive to OTC treatment. History of allergic rhinitis and currently not using Flonase which has been prescribed.  Symptom onset gradual, timing constant, pain moderate.  Past Medical History   Diagnosis Date   ??? Arthritis    ??? Back injury    ??? Benign prostatic hyperplasia with lower urinary tract symptoms    ??? Benign prostatic hyperplasia without lower urinary tract symptoms    ??? Cervical fusion syndrome 10/2003   ??? Cervical fusion syndrome 2005   ??? Cervical spondylosis 12/24/2012   ??? Chronic pain 1980     lower back    ??? Chronic pain 1980     lower back   ??? DDD (degenerative disc disease), cervical 12/24/2012   ??? DDD (degenerative disc disease), lumbar 12/24/2012   ??? DJD (degenerative joint disease), multiple sites 12/24/2012   ??? Foraminal stenosis of cervical region 12/24/2012   ??? GAD (generalized anxiety disorder) 12/24/2012   ??? GERD (gastroesophageal reflux disease)    ??? Head injury    ??? Hepatitis C 12/24/2012   ??? Hepatitis C infection    ??? History of neurogenic bladder    ??? Incomplete bladder emptying    ??? Insomnia 12/24/2012   ??? LBP (low back pain) 12/24/2012   ??? Lower urinary tract symptoms (LUTS)    ??? Lumbar spondylosis 12/24/2012   ??? Musculoskeletal pain 12/24/2012   ??? Neck injury    ??? Neck pain 12/24/2012   ??? Neurogenic bladder    ??? Opioid dependence (HCC) 02/21/2013   ??? Perirectal abscess    ??? S/P cervical spinal fusion 12/24/2012   ??? S/P lumbar spinal fusion 12/24/2012   ??? Spasmodic torticollis 12/24/2012   ??? Spondylolisthesis of cervical region 12/24/2012   ??? Urge incontinence    ??? Urinary frequency    ??? Urinary urgency    ??? UTI (urinary tract infection)        Current Outpatient Prescriptions:   ???  amoxicillin (AMOXIL) 500 mg capsule, Take 1 Cap by mouth three (3) times daily for 7 days., Disp: 21 Cap, Rfl: 0   ???  lubiPROStone (AMITIZA) 24 mcg capsule, Take  by mouth., Disp: , Rfl:   ???  [START ON 05/26/2015] buprenorphine-naloxone (SUBOXONE) 8-2 mg film sublingaul film, 1 Film by SubLINGual route two (2) times a day for 30 days. Max Daily Amount: 2 Film. Indications: CHRONIC PAIN, OPIOID DEPENDENCE, Disp: 60 Film, Rfl: 2  ???  loratadine (CLARITIN) 10 mg tablet, Take 1 Tab by mouth daily., Disp: 30 Tab, Rfl: 0  ???  LORazepam (ATIVAN) 0.5 mg tablet, Take 1 Tab by mouth every eight (8) hours as needed for Anxiety., Disp: 30 Tab, Rfl: 1  ???  aspirin delayed-release 81 mg tablet, Take  by mouth daily., Disp: , Rfl:   ???  tamsulosin (FLOMAX) 0.4 mg capsule, TAKE 1 CAPSULE BY MOUTH DAILY AFTER DINNER, Disp: 90 Cap, Rfl: 0  ???  fluticasone (FLONASE) 50 mcg/actuation nasal spray, 2 Sprays by Both Nostrils route daily. 1 spray in each nostril every am, Disp: 1 Bottle, Rfl: 3  ???  oxybutynin chloride XL (DITROPAN XL) 10 mg CR tablet, Take 1 Tab by mouth daily., Disp: 90 Tab, Rfl: 3  ???  buprenorphine-naloxone (SUBOXONE) 8-2 mg film sublingaul film, by SubLINGual route daily., Disp: , Rfl:  Review of Systems   Constitutional: Negative.  Negative for chills, fever and malaise/fatigue.   HENT: Positive for congestion. Negative for ear discharge, ear pain and sore throat.    Eyes: Negative.  Negative for pain, discharge and redness.   Respiratory: Negative.  Negative for cough, sputum production, shortness of breath, wheezing and stridor.    Cardiovascular: Negative.  Negative for chest pain, palpitations and orthopnea.   Gastrointestinal: Negative.  Negative for abdominal pain, nausea and vomiting.   Musculoskeletal: Negative.  Negative for myalgias and neck pain.   Skin: Negative.  Negative for itching and rash.   Neurological: Negative for dizziness, sensory change, focal weakness and headaches.   Endo/Heme/Allergies: Positive for environmental allergies.       Physical Exam    Constitutional: He is oriented to person, place, and time. He appears well-developed. No distress.   Nontoxic appearance.  Raspy voice.   HENT:   Head: Normocephalic and atraumatic.   Right Ear: External ear normal.   Left Ear: External ear normal.   Mouth/Throat: Oropharynx is clear and moist. No oropharyngeal exudate.   Face is symmetrical.  Boggy gray appearance to nasal passages.   Eyes: EOM are normal. Right eye exhibits no discharge. Left eye exhibits no discharge. No scleral icterus.   Neck: Neck supple. No tracheal deviation present.   Cardiovascular: Normal rate, regular rhythm, normal heart sounds and intact distal pulses.  Exam reveals no gallop.    Pulmonary/Chest: Effort normal and breath sounds normal. No stridor. No respiratory distress. He has no wheezes. He has no rales.   Abdominal: Soft. Bowel sounds are normal. He exhibits no distension. There is no tenderness. There is no rebound.   Musculoskeletal: He exhibits no edema, tenderness or deformity.   Lymphadenopathy:     He has no cervical adenopathy.   Neurological: He is alert and oriented to person, place, and time. No cranial nerve deficit. He exhibits normal muscle tone. Coordination normal.   Skin: Skin is warm and dry. No rash noted. He is not diaphoretic. No erythema. No pallor.   Psychiatric: He has a normal mood and affect. His behavior is normal. Judgment and thought content normal.   Nursing note and vitals reviewed.      ASSESSMENT and PLAN  1.  Acute sinusitis:   -Flonase: use as directed.   -Amoxiciling 500 mg tid x 10 days.   -OTC for symptom comfort.  Patient is in agreement with the treatment plan.  Return to the clinic if needed.  All questions answered and discharge instructions reviewed with the patient.

## 2015-05-21 NOTE — Progress Notes (Signed)
Aaron Reid is a 59 y.o. male here today for sinus pressure and pain  ,drainage ,ear pain  X  1 months     1. Have you been to the ER, urgent care clinic or hospitalized since your last visit? NO.     2. Have you seen or consulted any other health care providers outside of the [facility] since your last visit (Include any pap smears or colon screening)? NO      Do you have an Advanced Directive? NO    Would you like information on Advanced Directives? NO    Learning Assessment 06/24/2014   PRIMARY LEARNER Patient   HIGHEST LEVEL OF EDUCATION - PRIMARY LEARNER  > 4 YEARS OF COLLEGE   BARRIERS PRIMARY LEARNER NONE   CO-LEARNER CAREGIVER No   PRIMARY LANGUAGE ENGLISH   LEARNER PREFERENCE PRIMARY READING   ANSWERED BY patient   RELATIONSHIP SELF

## 2015-05-21 NOTE — Patient Instructions (Signed)
Saline Nasal Washes: Care Instructions  Your Care Instructions  Saline nasal washes help keep the nasal passages open by washing out thick or dried mucus. This simple remedy can help relieve symptoms of allergies, sinusitis, and colds. It also can make the nose feel more comfortable by keeping the mucous membranes moist. You may notice a little burning sensation in your nose the first few times you use the solution, but this usually gets better in a few days.  Follow-up care is a key part of your treatment and safety. Be sure to make and go to all appointments, and call your doctor if you are having problems. It's also a good idea to know your test results and keep a list of the medicines you take.  How can you care for yourself at home?  ?? You can buy premixed saline solution in a squeeze bottle or other sinus rinse products at a drugstore. Read and follow the instructions on the label.  ?? You also can make your own saline solution by adding 1 teaspoon of salt and 1 teaspoon of baking soda to 2 cups of distilled water.  ?? If you use a homemade solution, pour a small amount into a clean bowl. Using a rubber bulb syringe, squeeze the syringe and place the tip in the salt water. Pull a small amount of the salt water into the syringe by relaxing your hand.  ?? Sit down with your head tilted slightly back. Do not lie down. Put the tip of the bulb syringe or the squeeze bottle a little way into one of your nostrils. Gently drip or squirt a few drops into the nostril. Repeat with the other nostril. Some sneezing and gagging are normal at first.  ?? Gently blow your nose.  ?? Wipe the syringe or bottle tip clean after each use.  ?? Repeat this 2 or 3 times a day.  ?? Use nasal washes gently if you have nosebleeds often.  When should you call for help?  Watch closely for changes in your health, and be sure to contact your doctor if:  ?? You often get nosebleeds.  ?? You have problems doing the nasal washes.   Where can you learn more?  Go to http://www.healthwise.net/GoodHelpConnections  Enter B784 in the search box to learn more about "Saline Nasal Washes: Care Instructions."  ?? 2006-2016 Healthwise, Inc[employer]. Care instructions adapted under license by [employer] (which disclaims liability or warranty for this information). This care instruction is for use with your licensed healthcare professional. If you have questions about a medical condition or this instruction, always ask your healthcare professional. Healthwise, Incorporated disclaims any warranty or liability for your use of this information.  Content Version: 11.0.578772; Current as of: May 27, 2014        Sinusitis: Care Instructions  Your Care Instructions     Sinusitis is an infection of the lining of the sinus cavities in your head. Sinusitis often follows a cold. It causes pain and pressure in your head and face.  In most cases, sinusitis gets better on its own in 1 to 2 weeks. But some mild symptoms may last for several weeks. Sometimes antibiotics are needed.  Follow-up care is a key part of your treatment and safety. Be sure to make and go to all appointments, and call your doctor if you are having problems. It's also a good idea to know your test results and keep a list of the medicines you take.  How can   you care for yourself at home?  ?? Take an over-the-counter pain medicine, such as acetaminophen (Tylenol), ibuprofen (Advil, Motrin), or naproxen (Aleve). Read and follow all instructions on the label.  ?? If the doctor prescribed antibiotics, take them as directed. Do not stop taking them just because you feel better. You need to take the full course of antibiotics.  ?? Be careful when taking over-the-counter cold or flu medicines and Tylenol at the same time. Many of these medicines have acetaminophen, which is Tylenol. Read the labels to make sure that you are not taking  more than the recommended dose. Too much acetaminophen (Tylenol) can be harmful.  ?? Breathe warm, moist air from a steamy shower, a hot bath, or a sink filled with hot water. Avoid cold, dry air. Using a humidifier in your home may help. Follow the directions for cleaning the machine.  ?? Use saline (saltwater) nasal washes to help keep your nasal passages open and wash out mucus and bacteria. You can buy saline nose drops at a grocery store or drugstore. Or you can make your own at home by adding 1 teaspoon of salt and 1 teaspoon of baking soda to 2 cups of distilled water. If you make your own, fill a bulb syringe with the solution, insert the tip into your nostril, and squeeze gently. Blow your nose.  ?? Put a hot, wet towel or a warm gel pack on your face 3 or 4 times a day for 5 to 10 minutes each time.  ?? Try a decongestant nasal spray like oxymetazoline (Afrin). Do not use it for more than 3 days in a row. Using it for more than 3 days can make your congestion worse.  When should you call for help?  Call your doctor now or seek immediate medical care if:  ?? You have new or worse swelling or redness in your face or around your eyes.  ?? You have a new or higher fever.  Watch closely for changes in your health, and be sure to contact your doctor if:  ?? You have new or worse facial pain.  ?? The mucus from your nose becomes thicker (like pus) or has new blood in it.  ?? You are not getting better as expected.  Where can you learn more?  Go to http://www.healthwise.net/GoodHelpConnections  Enter I933 in the search box to learn more about "Sinusitis: Care Instructions."  ?? 2006-2016 Healthwise, [employer]. Care instructions adapted under license by [employer] (which disclaims liability or warranty for this information). This care instruction is for use with your licensed healthcare professional. If you have questions about a medical condition  or this instruction, always ask your healthcare professional. Healthwise, Incorporated disclaims any warranty or liability for your use of this information.  Content Version: 11.0.578772; Current as of: April 24, 2014

## 2015-05-24 ENCOUNTER — Ambulatory Visit: Admit: 2015-05-24 | Discharge: 2015-05-24 | Payer: MEDICARE | Attending: Family | Primary: Internal Medicine

## 2015-05-24 DIAGNOSIS — B182 Chronic viral hepatitis C: Secondary | ICD-10-CM

## 2015-05-24 MED ORDER — LEDIPASVIR 90 MG-SOFOSBUVIR 400 MG TABLET
90-400 mg | ORAL_TABLET | Freq: Every day | ORAL | 2 refills | Status: AC
Start: 2015-05-24 — End: 2015-08-16

## 2015-05-24 NOTE — Progress Notes (Signed)
[place], MD, FACP, [facility]  Gertie Fey, NP   [name], NP   Collier Salina, NP        at [facility]     [address] [place]  [address]     [phone]     FAX: [phone]    at [facility]     [address], [place]  [address]     [phone]     FAX: [phone]       Patient Care Team:  [name], MD as PCP - General (Internal Medicine)  [name], MD (Gastroenterology)  [name], MD (Internal Medicine)  [name], RN as Ambulatory Care Navigator  [name], NP (Nurse Practitioner)  [name], MD (Colon and Rectal Surgery)      Problem List  Date Reviewed: 06/06/2015          Codes Class Noted    BPH (benign prostatic hypertrophy) ICD-10-CM: N40.0  ICD-9-CM: 600.00  Unknown        Chronic hepatitis C ([place]) ICD-10-CM: B18.2  ICD-9-CM: 070.54  Unknown        S/P lumbar spinal fusion ICD-10-CM: Z98.1  ICD-9-CM: V45.4  12/24/2012        S/P cervical spinal fusion ICD-10-CM: Z98.1  ICD-9-CM: V45.4  12/24/2012        GERD (gastroesophageal reflux disease) ICD-10-CM: K21.9  ICD-9-CM: 530.81  12/24/2012        Chronic pain ICD-10-CM: G89.29  ICD-9-CM: 338.29  06/[birth year]/1978    Overview Signed 03/10/2015  2:37 PM by Sonia Side     lower back                    [facility] for education and management of chronic hepatitis C in the setting of cirrhosis.  The active problem list, all pertinent past medical history, medications, liver histology, endoscopic studies, radiologic findings and laboratory findings related to the liver disorder were reviewed with the patient.     The patient is a 59 y.o. Black male who was noted to have abnormalities in liver chemistries and subsequently tested positive for chronic HCV in 2011.  The patient was followed at [facility] for a few years and has  recently moved to [place] area.    Risk factors for acquiring HCV are blood transfusions during back surgery in 1999.  There was no history of acute incteric hepatitis at the time of these risk factors.      Imaging of the liver was recently performed.  Coarse heterogeneously echogenic echotexture noted. No focal mass.    Shear wave elastography was performed with the ultrasound.  This suggests that the patient has cirrhosis.  E Median is 13.93    A liver biopsy was performed in ~2000 at [facility].   The results are not available.  The patient reports this demonstrated mild scaring.    The patient has never received treatment for chronic HCV.      The most recent laboratory studies indicate that the liver transaminases are normal, alkaline phosphatase is normal, tests of hepatic synthetic and metabolic function are normal, and the platelet count is normal.      The patient notes fatigue, diffuse abdominal pain, back pain.  The patient has limitations in functional activities secondary to other medical problems that are not related to the liver disease.      The patient completes all daily activities without any functional limitations. The patient has not experienced fatigue, fevers, chills, shortness of breath, chest pain, pain in the right side over the liver, diffuse abdominal pain, nausea, vomiting, constipation, diarrhrea, dry eyes, dry mouth, arthralgias, myalgias, yellowing of the eyes or skin, itching, dark urine, problems concentrating, swelling of the abdomen, swelling of the lower extremities, hematemesis, or hematochezia.      ALLERGIES  Allergies   Allergen Reactions   ??? Latex Rash   ??? Cymbalta [Duloxetine] Anxiety   ??? Duragesic [Fentanyl] Drowsiness     Altered LOC       MEDICATIONS  Current Outpatient Prescriptions   Medication Sig   ??? ledipasvir-sofosbuvir (HARVONI) 90-400 mg tab Take 1 Tab by mouth daily for 84 days. Indications: CHRONIC HEPATITIS C - GENOTYPE 1    ??? amoxicillin (AMOXIL) 500 mg capsule Take 1 Cap by mouth three (3) times daily for 7 days.   ??? lubiPROStone (AMITIZA) 24 mcg capsule Take  by mouth.   ??? [START ON 05/26/2015] buprenorphine-naloxone (SUBOXONE) 8-2 mg film sublingaul film 1 Film by SubLINGual route two (2) times a day for 30 days. Max Daily Amount: 2 Film. Indications: CHRONIC PAIN, OPIOID DEPENDENCE   ??? tamsulosin (FLOMAX) 0.4 mg capsule TAKE 1 CAPSULE BY MOUTH DAILY AFTER DINNER   ??? loratadine (CLARITIN) 10 mg tablet Take 1 Tab by mouth daily.   ??? fluticasone (FLONASE) 50 mcg/actuation nasal spray 2 Sprays by Both Nostrils route daily. 1 spray in each nostril every am   ??? LORazepam (ATIVAN) 0.5 mg tablet Take 1 Tab by mouth every eight (8) hours as needed for Anxiety.   ??? aspirin delayed-release 81 mg tablet Take  by mouth daily.     No current facility-administered medications for this visit.        REVIEW OF SYSTEMS:  Systems related to all organ systems were reviewed.  All were negative except as noted above.    FAMILY HISTORY:  The father died of accident.    The mother died of natural causes.    There are no other persons in the family with HCV or liver disease.       SOCIAL HISTORY:  The patient is divorced.    The patient has 4 children, and 6 grandchildren.    The patient has never used tobacco products.    The patient has never consumed significant amounts of alcohol.    The patient used to work in [employer].  Currently a full time student studying mental health counceling.        PHYSICAL EXAMINATION:  Visit Vitals   ??? BP 128/87 (BP 1 Location: Right arm, BP Patient Position: Sitting)   ??? Pulse 76   ??? Temp 97.8 ??F (36.6 ??C) (Tympanic)   ??? Resp 16   ??? Ht 5' 10"  (1.778 m)   ??? Wt 223 lb (101.2 kg)   ??? SpO2 100%   ??? BMI 32 kg/m2     General: No acute distress.   Eyes: Sclera anicteric.   ENT: No oral lesions.  Thyroid normal.  Nodes: No adenopathy.   Skin: No spider angiomata.  No jaundice.  No palmar erythema.   Respiratory: Lungs clear to auscultation.   Cardiovascular: Regular heart rate.  No murmurs.  No JVD.  Abdomen: Soft non-tender.  Liver size  normal to percussion/palpation.  Spleen not palpable. No obvious ascites.  Extremities: No edema.  No muscle wasting.  No gross arthritic changes.  Neurologic: Alert and oriented.  Cranial nerves grossly intact.  No asterixsis.    LABORATORY STUDIES:  [facility] Ref Rng 04/14/2015 02/18/2015 09/10/2014   WBC 4.6 - 13.2 K/uL 4.6 4.1 (L) 4.8   ANC 1.8 - 8.0 K/UL 2.0 2.0 1.8   HGB 13.0 - 16.0 g/dL 14.9 16.1 (H) 13.8   PLT 135 - 420 K/uL 180 183 145 (L)   INR 0.8 - 1.2    1.1    AST 15 - 37 U/L 39 (H)  34   ALT 16 - 61 U/L 48  35   Alk Phos 45 - 117 U/L 63  59   Bili, Total 0.2 - 1.0 MG/DL 0.5  0.6   Bili, Direct 0.0 - 0.2 MG/DL 0.2     Albumin 3.4 - 5.0 g/dL 3.8  3.9   BUN 7.0 - 18 MG/DL 16 13 16    Creat 0.6 - 1.3 MG/DL 0.98 1.12 0.88   Na 136 - 145 mmol/L 142 138 146 (H)   K 3.5 - 5.5 mmol/L 4.6 4.8 4.5   Cl 100 - 108 mmol/L 104 102 104   CO2 21 - 32 mmol/L 33 (H) 28 27   Glucose 74 - 99 mg/dL 128 (H) 118 (H) 95     SEROLOGIES:  [facility] Latest Ref Rng 11/12/2012   Hep A Ab, Total Negative Negative   Hep B Surface Ag Negative Negative   Hep B Core Ab, Total Negative Positive (A)   Hep C Genotype See Note 1b     Anti-HBsurface positive, HCV RNA Log 6.2 IU/ml    LIVER HISTOLOGY:  04/2015.  E Median is 13.93  This suggests cirrhosis.    ENDOSCOPIC PROCEDURES:  Not available or performed    RADIOLOGY:  04/2015. Ultrasound of the liver.  Consistent with cirrhosis.  No hepatic masses.      OTHER TESTING:  Not available or performed    ASSESSMENT AND PLAN:  Chronic HCV with cirrhosis per elastography.  The most recent laboratory studies indicate that the liver transaminases are normal, alkaline phosphatase is normal, tests of hepatic synthetic and metabolic function are normal, and the platelet count is normal.       Complications of cirrhosis were discussed in detail. We discussed thrombocytopenia, portal hypertension, varices, GI bleeding, peripheral edema, ascites, hepatic encephalopathy, and hepatocellular carcinoma. We discussed the need for follow ups on a regular basis, at 3 month intervals to monitor for complications. We discussed the need for every 6 month liver imaging studies.     HCV.  The patient has genotype 1B and is treatment naive.  We discussed treatment options. One treatment regime is Harvoni, a combination pill of 400 mg sofosbuvir and 90 mg ledipasvir. The treatment regime is one tablet daily for 12 weeks. He would like to be treated with this regime. This was ordered through [facility].       I have explained to him that I have ordered his medication through a specialty pharmacy and they will be delivered to his home.  He may begin taking the treatment medications as soon as they arrive.  He is to call and make an appointment for treatment week #4 once he begins therapy.  He voiced understanding of this plan.     The patient was directed to continue all current  medications at the current dosages.  There are no contraindications for the patient to take any medications that are necessary for treatment of other medical issues.  The patient was instructed not to start any PPI while on treatment.  The patient was instructed not to take any antacids within 4 hours of taking the Harvoni.  The patient verbalized understanding of these instructions.    The patient was counseled regarding alcohol consumption.      Vaccination for viral hepatitis A is recommended since the patient has no serologic evidence of previous exposure or vaccination with immunity.    Vaccination for viral hepatitis B is not required.  The patient has serologic evidence of prior exposure or vaccination with immunity.    25 minutes total time spent with this patient with more than 50% of this  time spent counseling and coordinating care as described above.     [place] in 3 months to initiate HCV treatment.    [name], NP   [facility], [place] [address]   [phone]

## 2015-06-02 NOTE — Telephone Encounter (Signed)
Aaron [facility] states that [facility] and stated that ou had not signed a prescription for him.

## 2015-06-09 ENCOUNTER — Ambulatory Visit: Admit: 2015-06-09 | Discharge: 2015-06-09 | Payer: MEDICARE | Attending: Family Medicine | Primary: Internal Medicine

## 2015-06-09 DIAGNOSIS — K529 Noninfective gastroenteritis and colitis, unspecified: Secondary | ICD-10-CM

## 2015-06-09 NOTE — Progress Notes (Signed)
Aaron Reid is a 60 y.o. Caucasian male and presents with a four day history of nausea, abd pain after eating something  Not particularly bad. He denies any adventure foreign travel. No toxic substance ingestion.     Chief Complaint   Patient presents with   ??? Vomiting     Subjective:    Additional Concerns: none    Patient Active Problem List    Diagnosis Date Noted   ??? BPH (benign prostatic hypertrophy)    ??? Chronic hepatitis C ([place])    ??? S/P lumbar spinal fusion 12/24/2012   ??? S/P cervical spinal fusion 12/24/2012   ??? GERD (gastroesophageal reflux disease) 12/24/2012   ??? Chronic pain 06/05/1978     Current Outpatient Prescriptions   Medication Sig Dispense Refill   ??? ledipasvir-sofosbuvir (HARVONI) 90-400 mg tab Take 1 Tab by mouth daily for 84 days. Indications: CHRONIC HEPATITIS C - GENOTYPE 1 28 Tab 2   ??? lubiPROStone (AMITIZA) 24 mcg capsule Take  by mouth.     ??? buprenorphine-naloxone (SUBOXONE) 8-2 mg film sublingaul film 1 Film by SubLINGual route two (2) times a day for 30 days. Max Daily Amount: 2 Film. Indications: CHRONIC PAIN, OPIOID DEPENDENCE 60 Film 2   ??? tamsulosin (FLOMAX) 0.4 mg capsule TAKE 1 CAPSULE BY MOUTH DAILY AFTER DINNER 90 Cap 0   ??? loratadine (CLARITIN) 10 mg tablet Take 1 Tab by mouth daily. 30 Tab 0   ??? fluticasone (FLONASE) 50 mcg/actuation nasal spray 2 Sprays by Both Nostrils route daily. 1 spray in each nostril every am 1 Bottle 3   ??? LORazepam (ATIVAN) 0.5 mg tablet Take 1 Tab by mouth every eight (8) hours as needed for Anxiety. 30 Tab 1   ??? aspirin delayed-release 81 mg tablet Take  by mouth daily.       Allergies   Allergen Reactions   ??? Latex Rash   ??? Cymbalta [Duloxetine] Anxiety   ??? Duragesic [Fentanyl] Drowsiness     Altered LOC     Past Medical History   Diagnosis Date   ??? Arthritis    ??? Back injury    ??? Benign prostatic hyperplasia with lower urinary tract symptoms    ??? Benign prostatic hyperplasia without lower urinary tract symptoms     ??? Cervical fusion syndrome 10/2003   ??? Cervical fusion syndrome 2005   ??? Cervical spondylosis 12/24/2012   ??? Chronic pain 1980     lower back    ??? Chronic pain 1980     lower back   ??? DDD (degenerative disc disease), cervical 12/24/2012   ??? DDD (degenerative disc disease), lumbar 12/24/2012   ??? DJD (degenerative joint disease), multiple sites 12/24/2012   ??? Foraminal stenosis of cervical region 12/24/2012   ??? GAD (generalized anxiety disorder) 12/24/2012   ??? GERD (gastroesophageal reflux disease)    ??? Head injury    ??? Hepatitis C 12/24/2012   ??? Hepatitis C infection    ??? History of neurogenic bladder    ??? Incomplete bladder emptying    ??? Insomnia 12/24/2012   ??? LBP (low back pain) 12/24/2012   ??? Lower urinary tract symptoms (LUTS)    ??? Lumbar spondylosis 12/24/2012   ??? Musculoskeletal pain 12/24/2012   ??? Neck injury    ??? Neck pain 12/24/2012   ??? Neurogenic bladder    ??? Opioid dependence ([place]) 02/21/2013   ??? Perirectal abscess    ??? S/P cervical spinal fusion 12/24/2012   ???  S/P lumbar spinal fusion 12/24/2012   ??? Spasmodic torticollis 12/24/2012   ??? Spondylolisthesis of cervical region 12/24/2012   ??? Urge incontinence    ??? Urinary frequency    ??? Urinary urgency    ??? UTI (urinary tract infection)      Past Surgical History   Procedure Laterality Date   ??? Endoscopy, colon, diagnostic     ??? Hx orthopaedic       spinal fusion, lumbar, cervical   ??? Hx orthopaedic       spinal fusion, lumbar cervical   ??? Hx back surgery       Family History   Problem Relation Age of Onset   ??? Heart Disease Sister    ??? Diabetes Brother    ??? Stroke Brother      Social History   Substance Use Topics   ??? Smoking status: Never Smoker   ??? Smokeless tobacco: Never Used   ??? Alcohol use No     ROS     General: negative for - chills, fatigue, fever, weight change  Resp: negative for - cough, shortness of breath or wheezing  CV: negative for - chest pain, edema or palpitations  GI: positive for - abdominal pain, change in bowel habits, constipation,  diarrhea or nausea/vomiting  MSK: negative for - joint pain, joint swelling or muscle pain  Neuro: negative for - confusion, headaches, seizures or weakness    Objective:  Vitals:    06/09/15 1637   BP: 123/78   Pulse: 80   Resp: 18   Temp: 97.1 ??F (36.2 ??C)   TempSrc: Oral   SpO2: 98%   Weight: 215 lb (97.5 kg)   Height: 5' 10"  (1.778 m)   PainSc:   0 - No pain     PE    Alert, well appearing, and in no distress  Mental status - alert, oriented to person, place, and time, normal mood, behavior, speech, dress, motor activity, and thought processes  Chest - clear to auscultation, no wheezes, rales or rhonchi, symmetric air entry, no tachypnea, retractions or cyanosis  Heart - normal rate, regular rhythm, normal S1, S2, no murmurs, rubs, clicks or gallops  Extremities - peripheral pulses normal, no pedal edema, no clubbing or cyanosis    [facility] Outpatient Visit on 04/14/2015   Component Date Value Ref Range Status   ??? HCV RNA by NAA, QL 04/14/2015 Positive* NEGATIVE   Final    Comment: (NOTE)  Positive: HCV RNA Detected  Performed At: [facility]  [place], [place] [id number]  Lindon Romp MD [id number]:[id number]     ??? Protein, total 04/14/2015 8.1  6.4 - 8.2 g/dL Final   ??? Albumin 04/14/2015 3.8  3.4 - 5.0 g/dL Final   ??? Globulin 04/14/2015 4.3* 2.0 - 4.0 g/dL Final   ??? A-G Ratio 04/14/2015 0.9  0.8 - 1.7   Final   ??? Bilirubin, total 04/14/2015 0.5  0.2 - 1.0 MG/DL Final   ??? Bilirubin, direct 04/14/2015 0.2  0.0 - 0.2 MG/DL Final   ??? Alk. phosphatase 04/14/2015 63  45 - 117 U/L Final   ??? AST 04/14/2015 39* 15 - 37 U/L Final   ??? ALT 04/14/2015 48  16 - 61 U/L Final   ??? Sodium 04/14/2015 142  136 - 145 mmol/L Final   ??? Potassium 04/14/2015 4.6  3.5 - 5.5 mmol/L Final   ??? Chloride 04/14/2015 104  100 - 108 mmol/L Final   ???  CO2 04/14/2015 33* 21 - 32 mmol/L Final   ??? Anion gap 04/14/2015 5  3.0 - 18 mmol/L Final   ??? Glucose 04/14/2015 128* 74 - 99 mg/dL Final    ??? BUN 04/14/2015 16  7.0 - 18 MG/DL Final   ??? Creatinine 04/14/2015 0.98  0.6 - 1.3 MG/DL Final   ??? BUN/Creatinine ratio 04/14/2015 16  12 - 20   Final   ??? GFR est AA 04/14/2015 >60  >60 ml/min/1.34m Final   ??? GFR est non-AA 04/14/2015 >60  >60 ml/min/1.7102mFinal    Comment: (NOTE)  Estimated GFR is calculated using the Modification of Diet in Renal   Disease (MDRD) Study equation, reported for both African Americans   (GFRAA) and non-African Americans (GFRNA), and normalized to 1.7373m body surface area. The physician must decide which value applies to   the patient. The MDRD study equation should only be used in   individuals age 27 72 older. It has not been validated for the   following: pregnant women, patients with serious comorbid conditions,   or on certain medications, or persons with extremes of body size,   muscle mass, or nutritional status.     ??? Calcium 04/14/2015 8.8  8.5 - 10.1 MG/DL Final   ??? WBC 04/14/2015 4.6  4.6 - 13.2 K/uL Final   ??? RBC 04/14/2015 5.07  4.70 - 5.50 M/uL Final   ??? HGB 04/14/2015 14.9  13.0 - 16.0 g/dL Final   ??? HCT 04/14/2015 45.4  36.0 - 48.0 % Final   ??? MCV 04/14/2015 89.5  74.0 - 97.0 FL Final   ??? MCHSinai/02/2015 29.4  24.0 - 34.0 PG Final   ??? MCHC 04/14/2015 32.8  31.0 - 37.0 g/dL Final   ??? RDW 04/14/2015 12.5  11.6 - 14.5 % Final   ??? PLATELET 04/14/2015 180  135 - 420 K/uL Final   ??? MPV 04/14/2015 12.2* 9.2 - 11.8 FL Final   ??? NEUTROPHILS 04/14/2015 45  40 - 73 % Final   ??? LYMPHOCYTES 04/14/2015 47  21 - 52 % Final   ??? MONOCYTES 04/14/2015 7  3 - 10 % Final   ??? EOSINOPHILS 04/14/2015 1  0 - 5 % Final   ??? BASOPHILS 04/14/2015 0  0 - 2 % Final   ??? ABS. NEUTROPHILS 04/14/2015 2.0  1.8 - 8.0 K/UL Final   ??? ABS. LYMPHOCYTES 04/14/2015 2.2  0.9 - 3.6 K/UL Final   ??? ABS. MONOCYTES 04/14/2015 0.3  0.05 - 1.2 K/UL Final   ??? ABS. EOSINOPHILS 04/14/2015 0.0  0.0 - 0.4 K/UL Final   ??? ABS. BASOPHILS 04/14/2015 0.0  0.0 - 0.06 K/UL Final   ??? DF 04/14/2015 AUTOMATED    Final    ??? Hepatitis C Quantitation 04/14/2015 1330000  IU/mL Final   ??? Hepatitis C log 10 04/14/2015 6.124  log10 IU/mL Final   ??? Test information: 04/14/2015 Comment    Final    Comment: (NOTE)  The quantitative range of this assay is 15 IU/mL to 100 million  IU/mL.  Performed At: [facility] [id number] Romp Ph:[phone]:[phone]  Office Visit on 03/10/2015   Component Date Value Ref Range Status   ??? PVR 03/10/2015 0  cc Final   ??? Color (UA POC) 03/10/2015 Yellow   Final   ??? Clarity (UA POC) 03/10/2015 Clear   Final   ??? Glucose (UA POC)  03/10/2015 Negative  Negative Final   ??? Bilirubin (UA POC) 03/10/2015 1+  Negative Final   ??? Ketones (UA POC) 03/10/2015 Trace  Negative Final   ??? Specific gravity (UA POC) 03/10/2015 1.030  1.001 - 1.035 Final   ??? Blood (UA POC) 03/10/2015 Trace  Negative Final   ??? pH (UA POC) 03/10/2015 5.0  4.6 - 8.0 Final   ??? Protein (UA POC) 03/10/2015 Negative  Negative mg/dL Final   ??? Urobilinogen (UA POC) 03/10/2015 0.2 mg/dL  0.2 - 1 Final   ??? Nitrites (UA POC) 03/10/2015 Negative  Negative Final   ??? Leukocyte esterase (UA POC) 03/10/2015 Negative  Negative Final   Hospital Outpatient Visit on 02/18/2015   Component Date Value Ref Range Status   ??? Special Requests: 02/18/2015 NO SPECIAL REQUESTS    Final   ??? Culture result: 02/18/2015 NO GROWTH 2 DAYS    Final   ??? WBC 02/18/2015 4.1* 4.6 - 13.2 K/uL Final   ??? RBC 02/18/2015 5.36  4.70 - 5.50 M/uL Final   ??? HGB 02/18/2015 16.1* 13.0 - 16.0 g/dL Final   ??? HCT 02/18/2015 48.9* 36.0 - 48.0 % Final   ??? MCV 02/18/2015 91.2  74.0 - 97.0 FL Final   ??? MCH 02/18/2015 30.0  24.0 - 34.0 PG Final   ??? MCHC 02/18/2015 32.9  31.0 - 37.0 g/dL Final   ??? RDW 02/18/2015 12.8  11.6 - 14.5 % Final   ??? PLATELET 02/18/2015 183  135 - 420 K/uL Final   ??? MPV 02/18/2015 13.7* 9.2 - 11.8 FL Final   ??? NEUTROPHILS 02/18/2015 48  40 - 73 % Final   ??? LYMPHOCYTES 02/18/2015 45  21 - 52 % Final    ??? MONOCYTES 02/18/2015 6  3 - 10 % Final   ??? EOSINOPHILS 02/18/2015 1  0 - 5 % Final   ??? BASOPHILS 02/18/2015 0  0 - 2 % Final   ??? ABS. NEUTROPHILS 02/18/2015 2.0  1.8 - 8.0 K/UL Final   ??? ABS. LYMPHOCYTES 02/18/2015 1.8  0.9 - 3.6 K/UL Final   ??? ABS. MONOCYTES 02/18/2015 0.2  0.05 - 1.2 K/UL Final   ??? ABS. EOSINOPHILS 02/18/2015 0.0  0.0 - 0.4 K/UL Final   ??? ABS. BASOPHILS 02/18/2015 0.0  0.0 - 0.06 K/UL Final   ??? DF 02/18/2015 AUTOMATED    Final   ??? Sodium 02/18/2015 138  136 - 145 mmol/L Final   ??? Potassium 02/18/2015 4.8  3.5 - 5.5 mmol/L Final   ??? Chloride 02/18/2015 102  100 - 108 mmol/L Final   ??? CO2 02/18/2015 28  21 - 32 mmol/L Final   ??? Anion gap 02/18/2015 8  3.0 - 18 mmol/L Final   ??? Glucose 02/18/2015 118* 74 - 99 mg/dL Final   ??? BUN 02/18/2015 13  7.0 - 18 MG/DL Final   ??? Creatinine 02/18/2015 1.12  0.6 - 1.3 MG/DL Final   ??? BUN/Creatinine ratio 02/18/2015 12  12 - 20   Final   ??? GFR est AA 02/18/2015 >60  >60 ml/min/1.73m Final   ??? GFR est non-AA 02/18/2015 >60  >60 ml/min/1.766mFinal    Comment: (NOTE)  Estimated GFR is calculated using the Modification of Diet in Renal   Disease (MDRD) Study equation, reported for both African Americans   (GFRAA) and non-African Americans (GFRNA), and normalized to 1.7368m body surface area. The physician must decide which value applies to   the patient. The MDRD  study equation should only be used in   individuals age 6 or older. It has not been validated for the   following: pregnant women, patients with serious comorbid conditions,   or on certain medications, or persons with extremes of body size,   muscle mass, or nutritional status.     ??? Calcium 02/18/2015 9.5  8.5 - 10.1 MG/DL Final   ??? aPTT 02/18/2015 35.9  23.0 - 36.4 SEC Final   ??? Prothrombin time 02/18/2015 13.6  11.5 - 15.2 sec Final   ??? INR 02/18/2015 1.1  0.8 - 1.2   Final    Comment:            INR Therapeutic Ranges         (on stable oral anticoagulant):     INDICATION                INR   DVT/PE/Atrial Fib          2.0-3.0  MI/Mechanical Heart Valve  2.5-3.5     Office Visit on 02/09/2015   Component Date Value Ref Range Status   ??? AMPHETAMINES UR POC 02/09/2015 Negative   Final   ??? COCAINE UR POC 02/09/2015 Negative   Final   ??? MDMA/ECSTASY UR POC 02/09/2015 Negative   Final   ??? METHADONE UR POC 02/09/2015 Negative   Final   ??? METHAMPHETAMINE UR POC 02/09/2015 Negative   Final   ??? METHYLPHENIDATE UR POC 02/09/2015 Negative   Final   ??? OPIATES UR POC 02/09/2015 Negative   Final   ??? OXYCODONE UR POC 02/09/2015 Negative   Final   ??? PHENCYCLIDINE UR POC 02/09/2015 Negative   Final   ??? TRICYCLICS UR POC 28/41/3244 Negative   Final   ??? BARBITURATES UR POC 02/09/2015 Negative   Final   ??? BENZODIAZEPINES UR POC 02/09/2015 Negative   Final   ??? CANNABINOIDS UR POC 02/09/2015 Negative   Final   Office Visit on 01/29/2015   Component Date Value Ref Range Status   ??? Color (UA POC) 01/29/2015 Yellow   Final   ??? Clarity (UA POC) 01/29/2015 Clear   Final   ??? Glucose (UA POC) 01/29/2015 Negative  Negative Final   ??? Bilirubin (UA POC) 01/29/2015 1+  Negative Final   ??? Ketones (UA POC) 01/29/2015 Trace  Negative Final   ??? Specific gravity (UA POC) 01/29/2015 1.020  1.001 - 1.035 Final   ??? Blood (UA POC) 01/29/2015 Negative  Negative Final   ??? pH (UA POC) 01/29/2015 7.0  4.6 - 8.0 Final   ??? Protein (UA POC) 01/29/2015 Negative  Negative mg/dL Final   ??? Urobilinogen (UA POC) 01/29/2015 0.2 mg/dL  0.2 - 1 Final   ??? Nitrites (UA POC) 01/29/2015 Negative  Negative Final   ??? Leukocyte esterase (UA POC) 01/29/2015 Negative  Negative Final   Office Visit on 01/20/2015   Component Date Value Ref Range Status   ??? Color (UA POC) 01/20/2015 Yellow   Final   ??? Clarity (UA POC) 01/20/2015 Clear   Final   ??? Glucose (UA POC) 01/20/2015 Negative  Negative Final   ??? Bilirubin (UA POC) 01/20/2015 Negative  Negative Final   ??? Ketones (UA POC) 01/20/2015 Negative  Negative Final    ??? Specific gravity (UA POC) 01/20/2015 1.030  1.001 - 1.035 Final   ??? Blood (UA POC) 01/20/2015 Negative  Negative Final   ??? pH (UA POC) 01/20/2015 5.5  4.6 - 8.0 Final   ???  Protein (UA POC) 01/20/2015 Negative  Negative mg/dL Final   ??? Urobilinogen (UA POC) 01/20/2015 0.2 mg/dL  0.2 - 1 Final   ??? Nitrites (UA POC) 01/20/2015 Negative  Negative Final   ??? Leukocyte esterase (UA POC) 01/20/2015 Negative  Negative Final   ??? PVR 01/20/2015 0  cc Final       TESTS  Results for orders placed or performed during the hospital encounter of 04/14/15   HCV RNA BY NAA QL,RFLX TO QT   Result Value Ref Range    HCV RNA by NAA, QL Positive (A) NEGATIVE     HEPATIC FUNCTION PANEL   Result Value Ref Range    Protein, total 8.1 6.4 - 8.2 g/dL    Albumin 3.8 3.4 - 5.0 g/dL    Globulin 4.3 (H) 2.0 - 4.0 g/dL    A-G Ratio 0.9 0.8 - 1.7      Bilirubin, total 0.5 0.2 - 1.0 MG/DL    Bilirubin, direct 0.2 0.0 - 0.2 MG/DL    Alk. phosphatase 63 45 - 117 U/L    AST 39 (H) 15 - 37 U/L    ALT 48 16 - 61 U/L   METABOLIC PANEL, BASIC   Result Value Ref Range    Sodium 142 136 - 145 mmol/L    Potassium 4.6 3.5 - 5.5 mmol/L    Chloride 104 100 - 108 mmol/L    CO2 33 (H) 21 - 32 mmol/L    Anion gap 5 3.0 - 18 mmol/L    Glucose 128 (H) 74 - 99 mg/dL    BUN 16 7.0 - 18 MG/DL    Creatinine 0.98 0.6 - 1.3 MG/DL    BUN/Creatinine ratio 16 12 - 20      GFR est AA >60 >60 ml/min/1.16m    GFR est non-AA >60 >60 ml/min/1.768m   Calcium 8.8 8.5 - 10.1 MG/DL   CBC WITH AUTOMATED DIFF   Result Value Ref Range    WBC 4.6 4.6 - 13.2 K/uL    RBC 5.07 4.70 - 5.50 M/uL    HGB 14.9 13.0 - 16.0 g/dL    HCT 45.4 36.0 - 48.0 %    MCV 89.5 74.0 - 97.0 FL    MCH 29.4 24.0 - 34.0 PG    MCHC 32.8 31.0 - 37.0 g/dL    RDW 12.5 11.6 - 14.5 %    PLATELET 180 135 - 420 K/uL    MPV 12.2 (H) 9.2 - 11.8 FL    NEUTROPHILS 45 40 - 73 %    LYMPHOCYTES 47 21 - 52 %    MONOCYTES 7 3 - 10 %    EOSINOPHILS 1 0 - 5 %    BASOPHILS 0 0 - 2 %    ABS. NEUTROPHILS 2.0 1.8 - 8.0 K/UL     ABS. LYMPHOCYTES 2.2 0.9 - 3.6 K/UL    ABS. MONOCYTES 0.3 0.05 - 1.2 K/UL    ABS. EOSINOPHILS 0.0 0.0 - 0.4 K/UL    ABS. BASOPHILS 0.0 0.0 - 0.06 K/UL    DF AUTOMATED     HEPATITIS C VIRUS REFLEX, QT   Result Value Ref Range    Hepatitis C Quantitation 1330000 IU/mL    Hepatitis C log 10 6.124 log10 IU/mL    Test information: Comment       Assessment/Plan:     Acute viral gastroenteritis - Symptomatic treatment with plenty of fluids, small frequent meals, and avoid dairy and greasy foods.  Lab review: no lab studies available for review at time of visit.     I have discussed the diagnosis with the patient and the intended plan as seen in the above orders. The patient has received an after-visit summary and questions were answered concerning future plans. I have discussed medication side effects and warnings with the patient as well.I have reviewed the plan of care with the patient, accepted their input and they are in agreement with the treatment goals.     F/U as needed.     Ashley Akin, MD

## 2015-06-09 NOTE — Progress Notes (Signed)
Aaron Reid is a 60 y.o. male here c/o possible food poisoning x 4 days. Pt states that he tried activated charcoal, but that it's not working.

## 2015-06-09 NOTE — Patient Instructions (Addendum)
Gastroenteritis: Care Instructions  Your Care Instructions  Gastroenteritis is an illness that may cause nausea, vomiting, and diarrhea. It is sometimes called "stomach flu." It can be caused by bacteria or a virus.  You will probably begin to feel better in 1 to 2 days. In the meantime, get plenty of rest and make sure you do not become dehydrated. Dehydration occurs when your body loses too much fluid.  Follow-up care is a key part of your treatment and safety. Be sure to make and go to all appointments, and call your doctor if you are having problems. It???s also a good idea to know your test results and keep a list of the medicines you take.  How can you care for yourself at home?  ?? If your doctor prescribed antibiotics, take them as directed. Do not stop taking them just because you feel better. You need to take the full course of antibiotics.  ?? Drink plenty of fluids to prevent dehydration, enough so that your urine is light yellow or clear like water. Choose water and other caffeine-free clear liquids until you feel better. If you have kidney, heart, or liver disease and have to limit fluids, talk with your doctor before you increase your fluid intake.  ?? Drink fluids slowly, in frequent, small amounts, because drinking too much too fast can cause vomiting.  ?? Begin eating mild foods, such as dry toast, yogurt, applesauce, bananas, and rice. Avoid spicy, hot, or high-fat foods, and do not drink alcohol or caffeine for a day or two. Do not drink milk or eat ice cream until you are feeling better.  How to prevent gastroenteritis  ?? Keep hot foods hot and cold foods cold.  ?? Do not eat meats, dressings, salads, or other foods that have been kept at room temperature for more than 2 hours.  ?? Use a thermometer to check your refrigerator. It should be between 34??F and 40??F.  ?? Defrost meats in the refrigerator or microwave, not on the kitchen counter.   ?? Keep your hands and your kitchen clean. Wash your hands, cutting boards, and countertops with hot soapy water frequently.  ?? Cook meat until it is well done.  ?? Do not eat raw eggs or uncooked sauces made with raw eggs.  ?? Do not take chances. If food looks or tastes spoiled, throw it out.  When should you call for help?  Call 911 anytime you think you may need emergency care. For example, call if:  ?? You vomit blood or what looks like coffee grounds.  ?? You passed out (lost consciousness).  ?? You pass maroon or very bloody stools.  Call your doctor now or seek immediate medical care if:  ?? You have severe belly pain.  ?? You have signs of needing more fluids. You have sunken eyes, a dry mouth, and pass only a little dark urine.  ?? You feel like you are going to faint.  ?? You have increased belly pain that does not go away in 1 to 2 days.  ?? You have new or increased nausea, or you are vomiting.  ?? You have a new or higher fever.  ?? Your stools are black and tarlike or have streaks of blood.  Watch closely for changes in your health, and be sure to contact your doctor if:  ?? You are dizzy or lightheaded.  ?? You urinate less than usual, or your urine is dark yellow or brown.  ?? You do not   feel better with each day that goes by.  Where can you learn more?  Go to http://www.healthwise.net/[employer].  Enter N142 in the search box to learn more about "Gastroenteritis: Care Instructions."  Current as of: Oct 27, 2014  Content Version: 11.1  ?? 2006-2016 [employer]. Care instructions adapted under license by [employer] (which disclaims liability or warranty for this information). If you have questions about a medical condition or this instruction, always ask your healthcare professional. [employer] disclaims any warranty or liability for your use of this information.

## 2015-06-15 ENCOUNTER — Encounter: Attending: Urology | Primary: Internal Medicine

## 2015-06-30 NOTE — Progress Notes (Signed)
Request for records from [facility] [place] [facility] and faxed request to [employer] to be process on 06/30/2015.

## 2015-07-05 ENCOUNTER — Telehealth: Payer: Self-pay | Admitting: Internal Medicine

## 2015-07-05 NOTE — Telephone Encounter (Signed)
Pt has moved back to the area and would love to re establish with you since you were his doctor for years before he moved.  Please advise

## 2015-07-06 NOTE — Telephone Encounter (Signed)
Tried to call pt to inform but mailbox was full

## 2015-07-06 NOTE — Telephone Encounter (Signed)
Ok with me 

## 2015-07-08 NOTE — Telephone Encounter (Signed)
appt made

## 2015-07-14 ENCOUNTER — Ambulatory Visit (INDEPENDENT_AMBULATORY_CARE_PROVIDER_SITE_OTHER): Payer: Commercial Managed Care - HMO | Admitting: Internal Medicine

## 2015-07-14 ENCOUNTER — Encounter: Payer: Self-pay | Admitting: Internal Medicine

## 2015-07-14 ENCOUNTER — Other Ambulatory Visit (INDEPENDENT_AMBULATORY_CARE_PROVIDER_SITE_OTHER): Payer: Commercial Managed Care - HMO

## 2015-07-14 VITALS — BP 136/82 | HR 73 | Resp 20 | Ht 70.0 in | Wt 219.0 lb

## 2015-07-14 DIAGNOSIS — K1121 Acute sialoadenitis: Secondary | ICD-10-CM | POA: Insufficient documentation

## 2015-07-14 DIAGNOSIS — M79642 Pain in left hand: Secondary | ICD-10-CM | POA: Diagnosis not present

## 2015-07-14 DIAGNOSIS — E119 Type 2 diabetes mellitus without complications: Secondary | ICD-10-CM

## 2015-07-14 DIAGNOSIS — Z0000 Encounter for general adult medical examination without abnormal findings: Secondary | ICD-10-CM | POA: Diagnosis not present

## 2015-07-14 DIAGNOSIS — N400 Enlarged prostate without lower urinary tract symptoms: Secondary | ICD-10-CM

## 2015-07-14 DIAGNOSIS — B182 Chronic viral hepatitis C: Secondary | ICD-10-CM

## 2015-07-14 DIAGNOSIS — G894 Chronic pain syndrome: Secondary | ICD-10-CM

## 2015-07-14 HISTORY — DX: Benign prostatic hyperplasia without lower urinary tract symptoms: N40.0

## 2015-07-14 HISTORY — DX: Chronic viral hepatitis C: B18.2

## 2015-07-14 LAB — BASIC METABOLIC PANEL
BUN: 13 mg/dL (ref 6–23)
CALCIUM: 9.3 mg/dL (ref 8.4–10.5)
CO2: 32 mEq/L (ref 19–32)
Chloride: 104 mEq/L (ref 96–112)
Creatinine, Ser: 0.86 mg/dL (ref 0.40–1.50)
GFR: 116.33 mL/min (ref >60.00)
Glucose, Bld: 96 mg/dL (ref 70–99)
Potassium: 4.6 mEq/L (ref 3.5–5.1)
SODIUM: 143 meq/L (ref 135–145)

## 2015-07-14 LAB — CBC WITH DIFFERENTIAL/PLATELET
Basophils Absolute: 0 10*3/uL (ref 0.0–0.1)
Basophils Relative: 0.4 % (ref 0.0–3.0)
EOS ABS: 0.1 10*3/uL (ref 0.0–0.7)
Eosinophils Relative: 1.3 % (ref 0.0–5.0)
HCT: 44.6 % (ref 39.0–52.0)
HEMOGLOBIN: 14.6 g/dL (ref 13.0–17.0)
Lymphocytes Relative: 51 % — ABNORMAL HIGH (ref 12.0–46.0)
Lymphs Abs: 2.9 10*3/uL (ref 0.7–4.0)
MCHC: 32.7 g/dL (ref 30.0–36.0)
MCV: 88.4 fl (ref 78.0–100.0)
MONO ABS: 0.5 10*3/uL (ref 0.1–1.0)
Monocytes Relative: 8.7 % (ref 3.0–12.0)
Neutro Abs: 2.2 10*3/uL (ref 1.4–7.7)
Neutrophils Relative %: 38.6 % — ABNORMAL LOW (ref 43.0–77.0)
Platelets: 192 10*3/uL (ref 150.0–400.0)
RBC: 5.04 Mil/uL (ref 4.22–5.81)
RDW: 12.6 % (ref 11.5–15.5)
WBC: 5.7 10*3/uL (ref 4.0–10.5)

## 2015-07-14 LAB — LIPID PANEL
Cholesterol: 203 mg/dL — ABNORMAL HIGH (ref 0–200)
HDL: 45 mg/dL (ref >39.00)
LDL Cholesterol: 145 mg/dL — ABNORMAL HIGH (ref 0–99)
NONHDL: 158.43
TRIGLYCERIDES: 68 mg/dL (ref 0.0–149.0)
Total CHOL/HDL Ratio: 5
VLDL: 13.6 mg/dL (ref 0.0–40.0)

## 2015-07-14 LAB — HEPATIC FUNCTION PANEL
ALK PHOS: 51 U/L (ref 39–117)
ALT: 11 U/L (ref 0–53)
AST: 16 U/L (ref 0–37)
Albumin: 4 g/dL (ref 3.5–5.2)
BILIRUBIN DIRECT: 0.2 mg/dL (ref 0.0–0.3)
BILIRUBIN TOTAL: 0.5 mg/dL (ref 0.2–1.2)
Total Protein: 7.8 g/dL (ref 6.0–8.3)

## 2015-07-14 LAB — URINALYSIS, ROUTINE W REFLEX MICROSCOPIC
BILIRUBIN URINE: NEGATIVE
Hgb urine dipstick: NEGATIVE
Ketones, ur: NEGATIVE
LEUKOCYTES UA: NEGATIVE
NITRITE: NEGATIVE
Specific Gravity, Urine: >=1.03 — AB (ref 1.000–1.030)
TOTAL PROTEIN, URINE-UPE24: NEGATIVE
URINE GLUCOSE: NEGATIVE
Urobilinogen, UA: 0.2 (ref 0.0–1.0)
pH: 5.5 (ref 5.0–8.0)

## 2015-07-14 LAB — MICROALBUMIN / CREATININE URINE RATIO
Creatinine,U: 188.6 mg/dL
MICROALB/CREAT RATIO: 0.3 mg/g (ref 0.0–30.0)
Microalb, Ur: 0.5 mg/dL (ref 0.0–1.9)

## 2015-07-14 LAB — TSH: TSH: 0.58 u[IU]/mL (ref 0.35–4.50)

## 2015-07-14 LAB — PSA: PSA: 0.75 ng/mL (ref 0.10–4.00)

## 2015-07-14 LAB — HEMOGLOBIN A1C: HEMOGLOBIN A1C: 6 % (ref 4.6–6.5)

## 2015-07-14 MED ORDER — PREDNISONE 10 MG PO TABS: ORAL_TABLET | ORAL | Status: DC

## 2015-07-14 MED ORDER — AMOXICILLIN-POT CLAVULANATE 875-125 MG PO TABS: 1.0000 | ORAL_TABLET | Freq: Two times a day (BID) | ORAL | Status: DC

## 2015-07-14 MED ORDER — AMOXICILLIN-POT CLAVULANATE 875-125 MG PO TABS
1.0000 | ORAL_TABLET | Freq: Two times a day (BID) | ORAL | Status: DC
Start: 2015-07-14 — End: 2015-07-14
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
## 2015-07-14 NOTE — Progress Notes (Signed)
Pre visit review using our clinic review tool, if applicable. No additional management support is needed unless otherwise documented below in the visit note. 

## 2015-07-14 NOTE — Patient Instructions (Signed)
Please take all new medication as prescribed - the antibiotic, and the prednisone  Please call in 3-5 days if the left face does not improve for ENT referral  Please call in 3-5 days if the left hand does not improve for Hand Surgury referral  You will be contacted regarding the referral for: Infectious Disease, Urology, and Pain Management  Please continue all other medications as before, and refills have been done if requested.  Please have the pharmacy call with any other refills you may need.  Please continue your efforts at being more active, low cholesterol diet, and weight control.  You are otherwise up to date with prevention measures today.  Please keep your appointments with your specialists as you may have planned  Please go to the LAB in the Basement (turn left off the elevator) for the tests to be done today  You will be contacted by phone if any changes need to be made immediately.  Otherwise, you will receive a letter about your results with an explanation, but please check with MyChart first.  Please remember to sign up for MyChart if you have not done so, as this will be important to you in the future with finding out test results, communicating by private email, and scheduling acute appointments online when needed.  Please return in 6 months, or sooner if needed, with Lab testing done 3-5 days before

## 2015-07-15 ENCOUNTER — Telehealth: Payer: Self-pay | Admitting: Internal Medicine

## 2015-07-15 ENCOUNTER — Other Ambulatory Visit: Payer: Self-pay | Admitting: Internal Medicine

## 2015-07-15 ENCOUNTER — Encounter: Payer: Self-pay | Admitting: Internal Medicine

## 2015-07-15 MED ORDER — ROSUVASTATIN CALCIUM 20 MG PO TABS: 20.0000 mg | ORAL_TABLET | Freq: Every day | ORAL | Status: DC

## 2015-07-15 NOTE — Telephone Encounter (Signed)
States Dr. Jenny Reichmann sent two scripts to incorrect pharmacy.  Scripts were not suppose to go to [facility].  They are suppose to go to [facility] [place] [facility] [address].

## 2015-07-16 ENCOUNTER — Telehealth: Payer: Self-pay | Admitting: *Deleted

## 2015-07-16 MED ORDER — PREDNISONE 10 MG PO TABS
ORAL_TABLET | ORAL | Status: DC
Start: 2015-07-16 — End: 2015-08-17

## 2015-07-16 MED ORDER — AMOXICILLIN-POT CLAVULANATE 875-125 MG PO TABS: 1.0000 | ORAL_TABLET | Freq: Two times a day (BID) | ORAL | Status: DC

## 2015-07-16 NOTE — Telephone Encounter (Signed)
Pt walk in stating saw md yesterday medication that was rx was sent to wrong pharmacy. He called yesterday but no one called him back. Per chart assistant [facility] [place]. Inform jennifer to let ptknow that we will send to correct pharmacy, and update pharmacy as well...Johny Chess

## 2015-07-16 NOTE — Telephone Encounter (Signed)
Pt came back stating that [facility] can not fill the two prescriptions because the [facility] [place] is [facility] that the rx's was [facility]. [facility] to [facility] spoke with pharmacist Bryson Ha he states that [facility]. [facility] spoke with [facility] gave her pt info and status on the two rx's. Pharmacist went ahead and canel the two rx's. Called [facility] back spoke with Balinda Quails he states when he try to re-run it insurance is still saying refill too soon. He is going to contact the insurance to see exactly whats going on, and try to get medication filled today...Johny Chess

## 2015-07-18 NOTE — Assessment & Plan Note (Signed)
Also for pain clinic referral, no pain med needed today

## 2015-07-18 NOTE — Assessment & Plan Note (Signed)
Asympt, for urology referral in f/u

## 2015-07-18 NOTE — Assessment & Plan Note (Signed)
[place] for ID clinic referral

## 2015-07-18 NOTE — Assessment & Plan Note (Signed)
Likely ganglion cyst, for hand surgury referral

## 2015-07-18 NOTE — Assessment & Plan Note (Signed)
Mild to mod, for antibx course,  to f/u any worsening symptoms or concerns 

## 2015-07-18 NOTE — Assessment & Plan Note (Signed)
stable overall by history and exam, recent data reviewed with pt, and pt to continue medical treatment as before,  to f/u any worsening symptoms or concerns Lab Results  Component Value Date   HGBA1C 6.0 07/14/2015

## 2015-07-18 NOTE — Assessment & Plan Note (Signed)

## 2015-07-22 ENCOUNTER — Telehealth: Payer: Self-pay | Admitting: Internal Medicine

## 2015-07-22 ENCOUNTER — Encounter: Attending: Family | Primary: Internal Medicine

## 2015-07-22 DIAGNOSIS — J3489 Other specified disorders of nose and nasal sinuses: Secondary | ICD-10-CM

## 2015-07-22 NOTE — Telephone Encounter (Signed)
Pt came by office to request referral for ENT because he is not better since seeing you on 07/14/15 & has completed the antibiotics.  Per pt, her want to see Dr. Lucia Gaskins.

## 2015-07-22 NOTE — Telephone Encounter (Signed)
Referral done

## 2015-08-03 ENCOUNTER — Encounter: Attending: Physician Assistant | Primary: Internal Medicine

## 2015-08-17 ENCOUNTER — Ambulatory Visit: Payer: Commercial Managed Care - HMO | Admitting: Pharmacist Clinician (PhC)/ Clinical Pharmacy Specialist

## 2015-08-17 ENCOUNTER — Other Ambulatory Visit: Payer: Self-pay | Admitting: Pharmacist Clinician (PhC)/ Clinical Pharmacy Specialist

## 2015-08-17 ENCOUNTER — Other Ambulatory Visit: Payer: Commercial Managed Care - HMO

## 2015-08-17 DIAGNOSIS — B182 Chronic viral hepatitis C: Secondary | ICD-10-CM | POA: Diagnosis not present

## 2015-08-17 LAB — COMPREHENSIVE METABOLIC PANEL
ALBUMIN: 3.9 g/dL (ref 3.6–5.1)
ALT: 16 U/L (ref 9–46)
AST: 17 U/L (ref 10–35)
Alkaline Phosphatase: 55 U/L (ref 40–115)
BILIRUBIN TOTAL: 0.4 mg/dL (ref 0.2–1.2)
BUN: 17 mg/dL (ref 7–25)
CALCIUM: 9.5 mg/dL (ref 8.6–10.3)
CO2: 28 mmol/L (ref 20–31)
CREATININE: 0.81 mg/dL (ref 0.70–1.25)
Chloride: 101 mmol/L (ref 98–110)
Glucose, Bld: 108 mg/dL — ABNORMAL HIGH (ref 65–99)
Potassium: 4.6 mmol/L (ref 3.5–5.3)
SODIUM: 139 mmol/L (ref 135–146)
TOTAL PROTEIN: 7.9 g/dL (ref 6.1–8.1)

## 2015-08-17 LAB — CBC
HEMATOCRIT: 42.6 % (ref 39.0–52.0)
HEMOGLOBIN: 14.7 g/dL (ref 13.0–17.0)
MCH: 30.4 pg (ref 26.0–34.0)
MCHC: 34.5 g/dL (ref 30.0–36.0)
MCV: 88 fL (ref 78.0–100.0)
MPV: 11.2 fL (ref 8.6–12.4)
Platelets: 196 10*3/uL (ref 150–400)
RBC: 4.84 MIL/uL (ref 4.22–5.81)
RDW: 13.2 % (ref 11.5–15.5)
WBC: 4.5 10*3/uL (ref 4.0–10.5)
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
## 2015-08-18 DIAGNOSIS — B182 Chronic viral hepatitis C: Secondary | ICD-10-CM | POA: Diagnosis not present

## 2015-08-18 LAB — HEPATITIS C RNA QUANTITATIVE: HCV Quantitative: NOT DETECTED IU/mL (ref <15)

## 2015-08-23 ENCOUNTER — Encounter: Attending: Family | Primary: Internal Medicine

## 2015-08-30 DIAGNOSIS — N401 Enlarged prostate with lower urinary tract symptoms: Secondary | ICD-10-CM | POA: Diagnosis not present

## 2015-08-30 DIAGNOSIS — M549 Dorsalgia, unspecified: Secondary | ICD-10-CM | POA: Diagnosis not present

## 2015-08-30 DIAGNOSIS — K5900 Constipation, unspecified: Secondary | ICD-10-CM | POA: Diagnosis not present

## 2015-08-30 DIAGNOSIS — Z981 Arthrodesis status: Secondary | ICD-10-CM | POA: Diagnosis not present

## 2015-08-30 DIAGNOSIS — R6884 Jaw pain: Secondary | ICD-10-CM | POA: Diagnosis not present

## 2015-09-09 DIAGNOSIS — R739 Hyperglycemia, unspecified: Secondary | ICD-10-CM | POA: Diagnosis not present

## 2015-09-09 DIAGNOSIS — R413 Other amnesia: Secondary | ICD-10-CM | POA: Diagnosis not present

## 2015-09-09 DIAGNOSIS — M2540 Effusion, unspecified joint: Secondary | ICD-10-CM | POA: Diagnosis not present

## 2015-09-14 DIAGNOSIS — K5909 Other constipation: Secondary | ICD-10-CM | POA: Diagnosis not present

## 2015-09-14 DIAGNOSIS — N3941 Urge incontinence: Secondary | ICD-10-CM | POA: Diagnosis not present

## 2015-09-14 DIAGNOSIS — N400 Enlarged prostate without lower urinary tract symptoms: Secondary | ICD-10-CM | POA: Diagnosis not present

## 2015-09-27 DIAGNOSIS — R6884 Jaw pain: Secondary | ICD-10-CM | POA: Diagnosis not present

## 2015-09-27 DIAGNOSIS — M26622 Arthralgia of left temporomandibular joint: Secondary | ICD-10-CM | POA: Diagnosis not present

## 2015-10-01 DIAGNOSIS — Z0000 Encounter for general adult medical examination without abnormal findings: Secondary | ICD-10-CM | POA: Diagnosis not present

## 2015-10-04 DIAGNOSIS — Z136 Encounter for screening for cardiovascular disorders: Secondary | ICD-10-CM | POA: Diagnosis not present

## 2015-10-04 DIAGNOSIS — Z1389 Encounter for screening for other disorder: Secondary | ICD-10-CM | POA: Diagnosis not present

## 2015-10-07 DIAGNOSIS — H5213 Myopia, bilateral: Secondary | ICD-10-CM | POA: Diagnosis not present

## 2015-10-07 DIAGNOSIS — H2513 Age-related nuclear cataract, bilateral: Secondary | ICD-10-CM | POA: Diagnosis not present

## 2015-10-07 DIAGNOSIS — H524 Presbyopia: Secondary | ICD-10-CM | POA: Diagnosis not present

## 2015-10-07 DIAGNOSIS — H52203 Unspecified astigmatism, bilateral: Secondary | ICD-10-CM | POA: Diagnosis not present

## 2015-10-07 DIAGNOSIS — H25013 Cortical age-related cataract, bilateral: Secondary | ICD-10-CM | POA: Diagnosis not present

## 2015-10-14 DIAGNOSIS — N3281 Overactive bladder: Secondary | ICD-10-CM | POA: Diagnosis not present

## 2015-10-14 DIAGNOSIS — N138 Other obstructive and reflux uropathy: Secondary | ICD-10-CM | POA: Diagnosis not present

## 2015-10-14 DIAGNOSIS — N401 Enlarged prostate with lower urinary tract symptoms: Secondary | ICD-10-CM | POA: Diagnosis not present

## 2015-10-19 ENCOUNTER — Telehealth: Payer: Self-pay | Admitting: *Deleted

## 2015-10-19 ENCOUNTER — Telehealth: Payer: Self-pay | Admitting: Diagnostic Neuroimaging

## 2015-10-19 NOTE — Telephone Encounter (Signed)
Pt confirm appt. ds

## 2015-10-19 NOTE — Telephone Encounter (Signed)
Message For: [facility]                  Taken 16-MAY-17 at  2:35PM by TBW ------------------------------------------------------------  Johny Drilling                CID  [id number]   Patient  SAME                  Pt's Dr  [facility]     Area Code  336  Phone#  [phone] [facility] 5/15                                                                      Disp:Y/N     If Y = C/B If No Response In 40minutes

## 2015-10-19 NOTE — Telephone Encounter (Signed)
Donald [name] spoke with patient and confirmed his new patient appointment tomorrow.

## 2015-10-20 ENCOUNTER — Encounter: Payer: Self-pay | Admitting: Diagnostic Neuroimaging

## 2015-10-20 ENCOUNTER — Ambulatory Visit (INDEPENDENT_AMBULATORY_CARE_PROVIDER_SITE_OTHER): Payer: Commercial Managed Care - HMO | Admitting: Diagnostic Neuroimaging

## 2015-10-20 VITALS — BP 108/72 | HR 68 | Ht 70.0 in | Wt 225.4 lb

## 2015-10-20 DIAGNOSIS — G4733 Obstructive sleep apnea (adult) (pediatric): Secondary | ICD-10-CM | POA: Diagnosis not present

## 2015-10-20 DIAGNOSIS — G3184 Mild cognitive impairment, so stated: Secondary | ICD-10-CM | POA: Diagnosis not present

## 2015-10-20 DIAGNOSIS — G894 Chronic pain syndrome: Secondary | ICD-10-CM | POA: Diagnosis not present

## 2015-10-20 DIAGNOSIS — R413 Other amnesia: Secondary | ICD-10-CM | POA: Diagnosis not present

## 2015-10-20 DIAGNOSIS — R251 Tremor, unspecified: Secondary | ICD-10-CM | POA: Diagnosis not present

## 2015-10-20 NOTE — Patient Instructions (Signed)
Thank you for coming to see [place] at [facility]. I hope we have been able to provide you high quality care today.  You may receive a patient satisfaction survey over the next few weeks. We would appreciate your feedback and comments so that we may continue to improve ourselves and the health of our patients.  - I will check MRI brain and sleep study consult - continue vitamin B12   ~~~~~~~~~~~~~~~~~~~~~~~~~~~~~~~~~~~~~~~~~~~~~~~~~~~~~~~~~~~~~~~~~  DR. [place] Gan'S GUIDE TO HAPPY AND HEALTHY LIVING These are some of my general health and wellness recommendations. Some of them may apply to you better than others. Please use common sense as you try these suggestions and feel free to ask me any questions.   ACTIVITY/FITNESS Mental, social, emotional and physical stimulation are very important for brain and body health. Try learning a new activity (arts, music, language, sports, games).  Keep moving your body to the best of your abilities. You can do this at home, inside or outside, the park, community center, gym or anywhere you like. Consider a physical therapist or personal trainer to get started. Consider the app Sworkit. Fitness trackers such as smart-watches, smart-phones or Fitbits can help as well.   NUTRITION Eat more plants: colorful vegetables, nuts, seeds and berries.  Eat less sugar, salt, preservatives and processed foods.  Avoid toxins such as cigarettes and alcohol.  Drink water when you are thirsty. Warm water with a slice of lemon is an excellent morning drink to start the day.  Consider these websites for more information The Nutrition Source (https://www.henry-hernandez.biz/) Precision Nutrition (WindowBlog.ch)   RELAXATION Consider practicing mindfulness meditation or other relaxation techniques such as deep breathing, prayer, yoga, tai chi, massage. See website mindful.org or the apps Headspace or Calm to  help get started.   SLEEP Try to get at least 7-8+ hours sleep per day. Regular exercise and reduced caffeine will help you sleep better. Practice good sleep hygeine techniques. See website sleep.org for more information.   PLANNING Prepare estate planning, living will, healthcare POA documents. Sometimes this is best planned with the help of an attorney. Theconversationproject.org and agingwithdignity.org are excellent resources.
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
## 2015-10-25 ENCOUNTER — Encounter: Payer: Self-pay | Admitting: Neurology

## 2015-10-25 ENCOUNTER — Ambulatory Visit (INDEPENDENT_AMBULATORY_CARE_PROVIDER_SITE_OTHER): Payer: Commercial Managed Care - HMO | Admitting: Neurology

## 2015-10-25 VITALS — BP 112/80 | HR 66 | Resp 20 | Ht 70.0 in | Wt 225.0 lb

## 2015-10-25 DIAGNOSIS — G4733 Obstructive sleep apnea (adult) (pediatric): Secondary | ICD-10-CM | POA: Diagnosis not present

## 2015-10-25 DIAGNOSIS — N319 Neuromuscular dysfunction of bladder, unspecified: Secondary | ICD-10-CM | POA: Diagnosis not present

## 2015-10-25 DIAGNOSIS — R351 Nocturia: Secondary | ICD-10-CM | POA: Diagnosis not present

## 2015-10-25 DIAGNOSIS — R0683 Snoring: Secondary | ICD-10-CM | POA: Diagnosis not present

## 2015-10-25 DIAGNOSIS — F1190 Opioid use, unspecified, uncomplicated: Secondary | ICD-10-CM

## 2015-10-25 NOTE — Patient Instructions (Signed)
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
## 2015-11-03 ENCOUNTER — Encounter: Payer: Self-pay | Admitting: Internal Medicine

## 2015-11-03 ENCOUNTER — Ambulatory Visit (INDEPENDENT_AMBULATORY_CARE_PROVIDER_SITE_OTHER): Payer: Commercial Managed Care - HMO | Admitting: Internal Medicine

## 2015-11-03 VITALS — BP 118/87 | HR 81 | Temp 98.1°F | Ht 72.0 in | Wt 220.0 lb

## 2015-11-03 DIAGNOSIS — B182 Chronic viral hepatitis C: Secondary | ICD-10-CM
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
## 2015-11-04 LAB — HEPATITIS C RNA QUANTITATIVE: HCV Quantitative: NOT DETECTED IU/mL (ref <15)

## 2015-11-06 ENCOUNTER — Ambulatory Visit
Admission: RE | Admit: 2015-11-06 | Discharge: 2015-11-06 | Disposition: A | Discharge status: Home / Self Care | Payer: Commercial Managed Care - HMO | Source: Ambulatory Visit | Attending: Diagnostic Neuroimaging | Admitting: Diagnostic Neuroimaging

## 2015-11-06 DIAGNOSIS — R251 Tremor, unspecified: Secondary | ICD-10-CM | POA: Diagnosis not present

## 2015-11-06 DIAGNOSIS — G3184 Mild cognitive impairment, so stated: Secondary | ICD-10-CM

## 2015-11-06 DIAGNOSIS — R413 Other amnesia: Secondary | ICD-10-CM | POA: Diagnosis not present

## 2015-11-09 ENCOUNTER — Telehealth: Payer: Self-pay | Admitting: *Deleted

## 2015-11-09 NOTE — Telephone Encounter (Signed)
LVM informing patient, per Dr Leta Baptist  that his MRI results are unremarkable.  Advised Dr Leta Baptist states he will continue with current plan; no new medications or recommendations at this time. Left name, number for questions.

## 2015-11-16 DIAGNOSIS — N138 Other obstructive and reflux uropathy: Secondary | ICD-10-CM | POA: Diagnosis not present

## 2015-11-16 DIAGNOSIS — N401 Enlarged prostate with lower urinary tract symptoms: Secondary | ICD-10-CM | POA: Diagnosis not present

## 2015-11-16 DIAGNOSIS — N3941 Urge incontinence: Secondary | ICD-10-CM | POA: Diagnosis not present

## 2015-11-16 DIAGNOSIS — N3281 Overactive bladder: Secondary | ICD-10-CM | POA: Diagnosis not present

## 2015-11-24 DIAGNOSIS — K5900 Constipation, unspecified: Secondary | ICD-10-CM | POA: Insufficient documentation

## 2015-11-24 DIAGNOSIS — M2669 Other specified disorders of temporomandibular joint: Secondary | ICD-10-CM | POA: Insufficient documentation

## 2015-11-24 DIAGNOSIS — M2540 Effusion, unspecified joint: Secondary | ICD-10-CM | POA: Insufficient documentation

## 2015-12-06 DIAGNOSIS — M545 Low back pain: Secondary | ICD-10-CM | POA: Diagnosis not present

## 2015-12-06 DIAGNOSIS — G8929 Other chronic pain: Secondary | ICD-10-CM | POA: Diagnosis not present

## 2015-12-06 DIAGNOSIS — M159 Polyosteoarthritis, unspecified: Secondary | ICD-10-CM | POA: Diagnosis not present

## 2015-12-06 DIAGNOSIS — M542 Cervicalgia: Secondary | ICD-10-CM | POA: Diagnosis not present

## 2015-12-09 ENCOUNTER — Ambulatory Visit (INDEPENDENT_AMBULATORY_CARE_PROVIDER_SITE_OTHER): Payer: Commercial Managed Care - HMO | Admitting: Neurology

## 2015-12-09 DIAGNOSIS — G4733 Obstructive sleep apnea (adult) (pediatric): Secondary | ICD-10-CM

## 2015-12-09 DIAGNOSIS — R0683 Snoring: Secondary | ICD-10-CM

## 2015-12-09 DIAGNOSIS — F1190 Opioid use, unspecified, uncomplicated: Secondary | ICD-10-CM

## 2015-12-09 DIAGNOSIS — N319 Neuromuscular dysfunction of bladder, unspecified: Secondary | ICD-10-CM

## 2015-12-09 DIAGNOSIS — R351 Nocturia: Secondary | ICD-10-CM

## 2015-12-13 DIAGNOSIS — M62830 Muscle spasm of back: Secondary | ICD-10-CM | POA: Diagnosis not present

## 2015-12-13 DIAGNOSIS — M545 Low back pain: Secondary | ICD-10-CM | POA: Diagnosis not present

## 2015-12-15 ENCOUNTER — Ambulatory Visit (HOSPITAL_COMMUNITY)
Admission: RE | Admit: 2015-12-15 | Discharge: 2015-12-15 | Disposition: A | Discharge status: Home / Self Care | Payer: Commercial Managed Care - HMO | Source: Ambulatory Visit | Attending: Internal Medicine | Admitting: Internal Medicine

## 2015-12-15 DIAGNOSIS — B1920 Unspecified viral hepatitis C without hepatic coma: Secondary | ICD-10-CM | POA: Diagnosis not present

## 2015-12-15 DIAGNOSIS — B182 Chronic viral hepatitis C: Secondary | ICD-10-CM | POA: Diagnosis not present

## 2015-12-20 ENCOUNTER — Ambulatory Visit: Payer: Commercial Managed Care - HMO | Admitting: Diagnostic Neuroimaging

## 2015-12-27 ENCOUNTER — Telehealth: Payer: Self-pay

## 2015-12-27 DIAGNOSIS — G4733 Obstructive sleep apnea (adult) (pediatric): Secondary | ICD-10-CM

## 2015-12-27 NOTE — Telephone Encounter (Signed)
I spoke to pt regarding his sleep study results. I advised him that his study revealed severe and complex sleep apnea with hypoxemia and treatment is needed. PAP therapy is indicated. Dr. Brett Fairy recommends proceeding with an attended titration study to optimize therapy. Pt says "I am fine with this except I have some concerns about the sanitation of the equipment that you use in the sleep lab." I advised him that I would have Robin, sleep lab manager call him to discuss. In the meantime, the order for a cpap titration will be placed and our sleep lab will call him to schedule. I advised pt to lose weight, diet, and exercise if not contraindicated. Pt verbalized understanding of results. Pt had no questions at this time but was encouraged to call back if questions arise.

## 2015-12-27 NOTE — Telephone Encounter (Signed)
Spoke with patient regarding his concerns with sanitation during a sleep study. Explained the process of cleaning equipment and laundry. Explained cleaning team comes through before night studies to clean bathrooms and rooms. He seemed ok with explanation and will come back for titration.

## 2015-12-28 ENCOUNTER — Ambulatory Visit (INDEPENDENT_AMBULATORY_CARE_PROVIDER_SITE_OTHER): Payer: Commercial Managed Care - HMO | Admitting: Neurology

## 2015-12-28 DIAGNOSIS — G4733 Obstructive sleep apnea (adult) (pediatric): Secondary | ICD-10-CM

## 2015-12-30 ENCOUNTER — Ambulatory Visit (INDEPENDENT_AMBULATORY_CARE_PROVIDER_SITE_OTHER): Payer: Commercial Managed Care - HMO | Admitting: Diagnostic Neuroimaging

## 2015-12-30 ENCOUNTER — Encounter: Payer: Self-pay | Admitting: Diagnostic Neuroimaging

## 2015-12-30 VITALS — BP 110/77 | HR 73 | Wt 221.8 lb

## 2015-12-30 DIAGNOSIS — R413 Other amnesia: Secondary | ICD-10-CM

## 2015-12-30 DIAGNOSIS — G3184 Mild cognitive impairment, so stated: Secondary | ICD-10-CM | POA: Diagnosis not present

## 2015-12-30 DIAGNOSIS — G4733 Obstructive sleep apnea (adult) (pediatric): Secondary | ICD-10-CM

## 2015-12-30 NOTE — Patient Instructions (Signed)
-   continue current medications and treatments
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
## 2016-01-11 ENCOUNTER — Telehealth: Payer: Self-pay

## 2016-01-11 ENCOUNTER — Ambulatory Visit: Payer: Commercial Managed Care - HMO | Admitting: Internal Medicine

## 2016-01-11 DIAGNOSIS — G4733 Obstructive sleep apnea (adult) (pediatric): Secondary | ICD-10-CM

## 2016-01-11 NOTE — Telephone Encounter (Signed)
Order sent to [facility]. Pt said he is agreeable to using [facility]. Letter mailed to pt.

## 2016-01-11 NOTE — Telephone Encounter (Signed)
I spoke to pt and advised him that his cpap titration showed a significant improvement with less respiratory events during titration compared to a previous nocturnal polysomnography and that Dr. Brett Fairy recommends that pt start a cpap. Pt says that he felt much better after using cpap the night of his study and pt is agreeable to starting a cpap. Pt has [employer]. Will send to [facility]. I advised pt to avoid caffeine containing beverages and chocolate. I advised pt to consider a dedicated sleep psychology referral if perception of insomnia is a clinical concern. Pt declined this referral. Pt verbalized understanding of results. Pt had no questions at this time but was encouraged to call back if questions arise.

## 2016-01-12 ENCOUNTER — Telehealth: Payer: Self-pay | Admitting: Emergency Medicine

## 2016-01-12 NOTE — Telephone Encounter (Signed)
Left VM with pt to call back and schedule 6 mo follow up with Dr Jenny Reichmann. Thanks

## 2016-01-12 NOTE — Telephone Encounter (Signed)
Ok to call to reschedule

## 2016-01-12 NOTE — Telephone Encounter (Signed)
Donald [name] missed his 6 mo follow up appt yesterday 8/8. Would you like me to call him back to reschedule?

## 2016-01-24 DIAGNOSIS — M542 Cervicalgia: Secondary | ICD-10-CM | POA: Diagnosis not present

## 2016-01-24 DIAGNOSIS — R291 Meningismus: Secondary | ICD-10-CM | POA: Diagnosis not present

## 2016-01-24 DIAGNOSIS — G894 Chronic pain syndrome: Secondary | ICD-10-CM | POA: Diagnosis not present

## 2016-01-24 DIAGNOSIS — G8911 Acute pain due to trauma: Secondary | ICD-10-CM | POA: Diagnosis not present

## 2016-01-24 DIAGNOSIS — M545 Low back pain: Secondary | ICD-10-CM | POA: Diagnosis not present

## 2016-02-03 ENCOUNTER — Ambulatory Visit (INDEPENDENT_AMBULATORY_CARE_PROVIDER_SITE_OTHER): Payer: Commercial Managed Care - HMO | Admitting: Internal Medicine

## 2016-02-03 ENCOUNTER — Encounter: Payer: Self-pay | Admitting: Internal Medicine

## 2016-02-03 VITALS — BP 111/74 | HR 66 | Temp 98.2°F | Ht 70.0 in | Wt 224.0 lb

## 2016-02-03 DIAGNOSIS — N3281 Overactive bladder: Secondary | ICD-10-CM | POA: Diagnosis not present

## 2016-02-03 DIAGNOSIS — N3941 Urge incontinence: Secondary | ICD-10-CM | POA: Insufficient documentation

## 2016-02-03 DIAGNOSIS — B182 Chronic viral hepatitis C: Secondary | ICD-10-CM | POA: Diagnosis not present

## 2016-02-03 DIAGNOSIS — K5909 Other constipation: Secondary | ICD-10-CM | POA: Diagnosis not present

## 2016-02-03 DIAGNOSIS — K7400 Hepatic fibrosis, unspecified: Secondary | ICD-10-CM | POA: Insufficient documentation

## 2016-02-03 DIAGNOSIS — N401 Enlarged prostate with lower urinary tract symptoms: Secondary | ICD-10-CM | POA: Diagnosis not present

## 2016-02-03 NOTE — Progress Notes (Signed)
   Subjective:    Patient ID: Donald [name], male    DOB: 06/17/1955, 60 y.o.   MRN: [id number]  HPI Here for follow up of HCV.    Has genotype 1 and had been started on Harvoni and finished when he came to see me.  Was hepatitis A and B immune.  Elastography with F2/3.  No alcohol.  Here for SVR 12.    Review of Systems  Constitutional: Negative for fatigue.  Gastrointestinal: Negative for diarrhea.  Skin: Negative for rash.  Neurological: Negative for headaches.       Objective:   Physical Exam  Constitutional: He appears well-developed and well-nourished.  Eyes: No scleral icterus.  Cardiovascular: Normal rate, regular rhythm and normal heart sounds.   Pulmonary/Chest: Effort normal and breath sounds normal.  Skin: No rash noted.          Assessment & Plan:

## 2016-02-03 NOTE — Assessment & Plan Note (Signed)
Discussed results.  No indication for Ainsworth screening.  No follow up needed.

## 2016-02-03 NOTE — Assessment & Plan Note (Signed)
Doing well so far.  SVR 12 today and 3 months for SVR24 with Pharm D follow up.

## 2016-02-04 DIAGNOSIS — G4733 Obstructive sleep apnea (adult) (pediatric): Secondary | ICD-10-CM | POA: Diagnosis not present

## 2016-02-04 LAB — HEPATITIS C RNA QUANTITATIVE: HCV QUANT: NOT DETECTED [IU]/mL (ref <15)

## 2016-02-17 ENCOUNTER — Other Ambulatory Visit: Payer: Self-pay | Admitting: Family

## 2016-02-17 ENCOUNTER — Ambulatory Visit
Admission: RE | Admit: 2016-02-17 | Discharge: 2016-02-17 | Disposition: A | Discharge status: Home / Self Care | Payer: Commercial Managed Care - HMO | Source: Ambulatory Visit | Attending: Family | Admitting: Family

## 2016-02-17 DIAGNOSIS — M79672 Pain in left foot: Secondary | ICD-10-CM

## 2016-02-17 DIAGNOSIS — M25562 Pain in left knee: Secondary | ICD-10-CM | POA: Diagnosis not present

## 2016-02-21 DIAGNOSIS — M545 Low back pain: Secondary | ICD-10-CM | POA: Diagnosis not present

## 2016-02-21 DIAGNOSIS — G894 Chronic pain syndrome: Secondary | ICD-10-CM | POA: Diagnosis not present

## 2016-03-05 DIAGNOSIS — G4733 Obstructive sleep apnea (adult) (pediatric): Secondary | ICD-10-CM | POA: Diagnosis not present

## 2016-03-20 ENCOUNTER — Ambulatory Visit: Payer: Commercial Managed Care - HMO | Admitting: Infectious Disease

## 2016-03-20 DIAGNOSIS — G894 Chronic pain syndrome: Secondary | ICD-10-CM | POA: Diagnosis not present

## 2016-03-20 DIAGNOSIS — M545 Low back pain: Secondary | ICD-10-CM | POA: Diagnosis not present

## 2016-03-22 ENCOUNTER — Ambulatory Visit (INDEPENDENT_AMBULATORY_CARE_PROVIDER_SITE_OTHER): Payer: Commercial Managed Care - HMO | Admitting: Neurology

## 2016-03-22 ENCOUNTER — Encounter: Payer: Self-pay | Admitting: Neurology

## 2016-03-22 VITALS — BP 118/70 | HR 64 | Resp 20 | Ht 70.0 in | Wt 230.0 lb

## 2016-03-22 DIAGNOSIS — G4731 Primary central sleep apnea: Secondary | ICD-10-CM
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
## 2016-04-03 ENCOUNTER — Encounter: Payer: Self-pay | Admitting: Neurology

## 2016-04-05 DIAGNOSIS — G4733 Obstructive sleep apnea (adult) (pediatric): Secondary | ICD-10-CM | POA: Diagnosis not present

## 2016-04-11 DIAGNOSIS — M25522 Pain in left elbow: Secondary | ICD-10-CM | POA: Diagnosis not present

## 2016-04-11 DIAGNOSIS — M25521 Pain in right elbow: Secondary | ICD-10-CM | POA: Diagnosis not present

## 2016-04-13 DIAGNOSIS — N400 Enlarged prostate without lower urinary tract symptoms: Secondary | ICD-10-CM | POA: Diagnosis not present

## 2016-04-13 DIAGNOSIS — N3281 Overactive bladder: Secondary | ICD-10-CM | POA: Diagnosis not present

## 2016-04-13 DIAGNOSIS — N3941 Urge incontinence: Secondary | ICD-10-CM | POA: Diagnosis not present

## 2016-04-13 DIAGNOSIS — K5909 Other constipation: Secondary | ICD-10-CM | POA: Diagnosis not present

## 2016-04-17 DIAGNOSIS — M542 Cervicalgia: Secondary | ICD-10-CM | POA: Diagnosis not present

## 2016-04-17 DIAGNOSIS — R291 Meningismus: Secondary | ICD-10-CM | POA: Diagnosis not present

## 2016-04-17 DIAGNOSIS — M545 Low back pain: Secondary | ICD-10-CM | POA: Diagnosis not present

## 2016-04-17 DIAGNOSIS — G894 Chronic pain syndrome: Secondary | ICD-10-CM | POA: Diagnosis not present

## 2016-05-04 ENCOUNTER — Other Ambulatory Visit: Payer: Commercial Managed Care - HMO

## 2016-05-04 ENCOUNTER — Ambulatory Visit: Payer: Commercial Managed Care - HMO | Admitting: Pharmacist Clinician (PhC)/ Clinical Pharmacy Specialist

## 2016-05-04 DIAGNOSIS — B182 Chronic viral hepatitis C: Secondary | ICD-10-CM

## 2016-05-04 NOTE — Progress Notes (Signed)
Agreed with Taylor's note. He'll likely be cured at this time since his SVR12 was neg.
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
## 2016-05-04 NOTE — Patient Instructions (Signed)
We will check labs today for Hepatitis C cure and see you in 1 week to discuss the results.

## 2016-05-05 DIAGNOSIS — G4733 Obstructive sleep apnea (adult) (pediatric): Secondary | ICD-10-CM | POA: Diagnosis not present

## 2016-05-08 LAB — HEPATITIS C RNA QUANTITATIVE: HCV QUANT: NOT DETECTED [IU]/mL (ref <15)

## 2016-05-11 ENCOUNTER — Ambulatory Visit (INDEPENDENT_AMBULATORY_CARE_PROVIDER_SITE_OTHER): Payer: Commercial Managed Care - HMO | Admitting: Pharmacist Clinician (PhC)/ Clinical Pharmacy Specialist

## 2016-05-11 ENCOUNTER — Ambulatory Visit: Payer: Commercial Managed Care - HMO

## 2016-05-11 DIAGNOSIS — B182 Chronic viral hepatitis C: Secondary | ICD-10-CM
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
## 2016-05-15 DIAGNOSIS — M545 Low back pain: Secondary | ICD-10-CM | POA: Diagnosis not present

## 2016-05-15 DIAGNOSIS — G894 Chronic pain syndrome: Secondary | ICD-10-CM | POA: Diagnosis not present

## 2016-05-17 DIAGNOSIS — G4733 Obstructive sleep apnea (adult) (pediatric): Secondary | ICD-10-CM | POA: Diagnosis not present

## 2016-06-05 DIAGNOSIS — G4733 Obstructive sleep apnea (adult) (pediatric): Secondary | ICD-10-CM | POA: Diagnosis not present

## 2016-06-12 DIAGNOSIS — G894 Chronic pain syndrome: Secondary | ICD-10-CM | POA: Diagnosis not present

## 2016-06-12 DIAGNOSIS — M545 Low back pain: Secondary | ICD-10-CM | POA: Diagnosis not present

## 2016-06-29 DIAGNOSIS — N3281 Overactive bladder: Secondary | ICD-10-CM | POA: Diagnosis not present

## 2016-06-29 DIAGNOSIS — N3941 Urge incontinence: Secondary | ICD-10-CM | POA: Diagnosis not present

## 2016-06-29 DIAGNOSIS — N529 Male erectile dysfunction, unspecified: Secondary | ICD-10-CM | POA: Diagnosis not present

## 2016-06-29 DIAGNOSIS — N401 Enlarged prostate with lower urinary tract symptoms: Secondary | ICD-10-CM | POA: Diagnosis not present

## 2016-06-29 DIAGNOSIS — N138 Other obstructive and reflux uropathy: Secondary | ICD-10-CM | POA: Diagnosis not present

## 2016-07-03 ENCOUNTER — Ambulatory Visit: Payer: Commercial Managed Care - HMO | Admitting: Diagnostic Neuroimaging

## 2016-07-04 ENCOUNTER — Encounter: Payer: Self-pay | Admitting: Diagnostic Neuroimaging

## 2016-07-04 ENCOUNTER — Ambulatory Visit (INDEPENDENT_AMBULATORY_CARE_PROVIDER_SITE_OTHER): Payer: Medicare HMO | Admitting: Diagnostic Neuroimaging

## 2016-07-04 VITALS — BP 126/73 | HR 70 | Wt 238.0 lb

## 2016-07-04 DIAGNOSIS — R413 Other amnesia: Secondary | ICD-10-CM | POA: Diagnosis not present

## 2016-07-04 DIAGNOSIS — G4733 Obstructive sleep apnea (adult) (pediatric): Secondary | ICD-10-CM | POA: Diagnosis not present
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
## 2016-07-06 DIAGNOSIS — N3281 Overactive bladder: Secondary | ICD-10-CM | POA: Diagnosis not present

## 2016-07-06 DIAGNOSIS — G4733 Obstructive sleep apnea (adult) (pediatric): Secondary | ICD-10-CM | POA: Diagnosis not present

## 2016-07-06 DIAGNOSIS — R319 Hematuria, unspecified: Secondary | ICD-10-CM | POA: Diagnosis not present

## 2016-07-06 DIAGNOSIS — N138 Other obstructive and reflux uropathy: Secondary | ICD-10-CM | POA: Diagnosis not present

## 2016-07-06 DIAGNOSIS — N401 Enlarged prostate with lower urinary tract symptoms: Secondary | ICD-10-CM | POA: Diagnosis not present

## 2016-07-06 DIAGNOSIS — R310 Gross hematuria: Secondary | ICD-10-CM | POA: Diagnosis not present

## 2016-07-10 DIAGNOSIS — G894 Chronic pain syndrome: Secondary | ICD-10-CM | POA: Diagnosis not present

## 2016-07-10 DIAGNOSIS — M545 Low back pain: Secondary | ICD-10-CM | POA: Diagnosis not present

## 2016-07-14 DIAGNOSIS — R319 Hematuria, unspecified: Secondary | ICD-10-CM | POA: Diagnosis not present

## 2016-07-14 DIAGNOSIS — R310 Gross hematuria: Secondary | ICD-10-CM | POA: Diagnosis not present

## 2016-07-25 DIAGNOSIS — T148XXA Other injury of unspecified body region, initial encounter: Secondary | ICD-10-CM | POA: Diagnosis not present

## 2016-07-25 DIAGNOSIS — K219 Gastro-esophageal reflux disease without esophagitis: Secondary | ICD-10-CM | POA: Diagnosis not present

## 2016-07-25 DIAGNOSIS — J0100 Acute maxillary sinusitis, unspecified: Secondary | ICD-10-CM | POA: Diagnosis not present

## 2016-08-03 DIAGNOSIS — N3281 Overactive bladder: Secondary | ICD-10-CM | POA: Diagnosis not present

## 2016-08-03 DIAGNOSIS — R933 Abnormal findings on diagnostic imaging of other parts of digestive tract: Secondary | ICD-10-CM | POA: Diagnosis not present

## 2016-08-03 DIAGNOSIS — N401 Enlarged prostate with lower urinary tract symptoms: Secondary | ICD-10-CM | POA: Diagnosis not present

## 2016-08-03 DIAGNOSIS — N3941 Urge incontinence: Secondary | ICD-10-CM | POA: Diagnosis not present

## 2016-08-03 DIAGNOSIS — G4733 Obstructive sleep apnea (adult) (pediatric): Secondary | ICD-10-CM | POA: Diagnosis not present

## 2016-08-03 DIAGNOSIS — N138 Other obstructive and reflux uropathy: Secondary | ICD-10-CM | POA: Diagnosis not present

## 2016-08-03 DIAGNOSIS — N529 Male erectile dysfunction, unspecified: Secondary | ICD-10-CM | POA: Diagnosis not present

## 2016-08-03 DIAGNOSIS — R310 Gross hematuria: Secondary | ICD-10-CM | POA: Diagnosis not present

## 2016-08-07 DIAGNOSIS — M545 Low back pain: Secondary | ICD-10-CM | POA: Diagnosis not present

## 2016-08-07 DIAGNOSIS — G894 Chronic pain syndrome: Secondary | ICD-10-CM | POA: Diagnosis not present

## 2016-08-07 DIAGNOSIS — K219 Gastro-esophageal reflux disease without esophagitis: Secondary | ICD-10-CM | POA: Diagnosis not present

## 2016-08-07 DIAGNOSIS — R933 Abnormal findings on diagnostic imaging of other parts of digestive tract: Secondary | ICD-10-CM | POA: Diagnosis not present

## 2016-08-15 DIAGNOSIS — M5030 Other cervical disc degeneration, unspecified cervical region: Secondary | ICD-10-CM | POA: Diagnosis not present

## 2016-08-15 DIAGNOSIS — I872 Venous insufficiency (chronic) (peripheral): Secondary | ICD-10-CM | POA: Diagnosis not present

## 2016-08-15 DIAGNOSIS — M5136 Other intervertebral disc degeneration, lumbar region: Secondary | ICD-10-CM | POA: Diagnosis not present

## 2016-08-17 DIAGNOSIS — D120 Benign neoplasm of cecum: Secondary | ICD-10-CM | POA: Diagnosis not present

## 2016-08-17 DIAGNOSIS — K5730 Diverticulosis of large intestine without perforation or abscess without bleeding: Secondary | ICD-10-CM | POA: Diagnosis not present

## 2016-08-17 DIAGNOSIS — R933 Abnormal findings on diagnostic imaging of other parts of digestive tract: Secondary | ICD-10-CM | POA: Diagnosis not present

## 2016-08-17 DIAGNOSIS — K648 Other hemorrhoids: Secondary | ICD-10-CM | POA: Diagnosis not present

## 2016-08-17 DIAGNOSIS — R938 Abnormal findings on diagnostic imaging of other specified body structures: Secondary | ICD-10-CM | POA: Diagnosis not present

## 2016-08-17 DIAGNOSIS — K649 Unspecified hemorrhoids: Secondary | ICD-10-CM | POA: Diagnosis not present

## 2016-08-22 DIAGNOSIS — R933 Abnormal findings on diagnostic imaging of other parts of digestive tract: Secondary | ICD-10-CM | POA: Diagnosis not present

## 2016-08-22 DIAGNOSIS — Z91040 Latex allergy status: Secondary | ICD-10-CM | POA: Diagnosis not present

## 2016-08-23 DIAGNOSIS — G894 Chronic pain syndrome: Secondary | ICD-10-CM | POA: Diagnosis not present

## 2016-08-23 DIAGNOSIS — M545 Low back pain: Secondary | ICD-10-CM | POA: Diagnosis not present

## 2016-09-03 DIAGNOSIS — G4733 Obstructive sleep apnea (adult) (pediatric): Secondary | ICD-10-CM | POA: Diagnosis not present

## 2016-09-20 DIAGNOSIS — M545 Low back pain: Secondary | ICD-10-CM | POA: Diagnosis not present

## 2016-09-20 DIAGNOSIS — G894 Chronic pain syndrome: Secondary | ICD-10-CM | POA: Diagnosis not present

## 2016-09-21 DIAGNOSIS — K639 Disease of intestine, unspecified: Secondary | ICD-10-CM | POA: Diagnosis not present

## 2016-10-03 DIAGNOSIS — G4733 Obstructive sleep apnea (adult) (pediatric): Secondary | ICD-10-CM | POA: Diagnosis not present

## 2016-10-03 DIAGNOSIS — Z23 Encounter for immunization: Secondary | ICD-10-CM | POA: Diagnosis not present

## 2016-10-03 DIAGNOSIS — R739 Hyperglycemia, unspecified: Secondary | ICD-10-CM | POA: Diagnosis not present

## 2016-10-03 DIAGNOSIS — E669 Obesity, unspecified: Secondary | ICD-10-CM | POA: Diagnosis not present

## 2016-10-03 DIAGNOSIS — E782 Mixed hyperlipidemia: Secondary | ICD-10-CM | POA: Diagnosis not present

## 2016-10-03 DIAGNOSIS — B182 Chronic viral hepatitis C: Secondary | ICD-10-CM | POA: Diagnosis not present

## 2016-10-03 DIAGNOSIS — D122 Benign neoplasm of ascending colon: Secondary | ICD-10-CM | POA: Diagnosis not present

## 2016-10-03 DIAGNOSIS — Z0000 Encounter for general adult medical examination without abnormal findings: Secondary | ICD-10-CM | POA: Diagnosis not present

## 2016-10-03 DIAGNOSIS — R319 Hematuria, unspecified: Secondary | ICD-10-CM | POA: Diagnosis not present

## 2016-10-09 DIAGNOSIS — H52213 Irregular astigmatism, bilateral: Secondary | ICD-10-CM | POA: Diagnosis not present

## 2016-11-03 DIAGNOSIS — G4733 Obstructive sleep apnea (adult) (pediatric): Secondary | ICD-10-CM | POA: Diagnosis not present

## 2016-11-07 DIAGNOSIS — K639 Disease of intestine, unspecified: Secondary | ICD-10-CM | POA: Diagnosis not present

## 2016-11-08 DIAGNOSIS — N3281 Overactive bladder: Secondary | ICD-10-CM | POA: Diagnosis not present

## 2016-11-08 DIAGNOSIS — N3941 Urge incontinence: Secondary | ICD-10-CM | POA: Diagnosis not present

## 2016-11-08 DIAGNOSIS — N529 Male erectile dysfunction, unspecified: Secondary | ICD-10-CM | POA: Diagnosis not present

## 2016-11-08 DIAGNOSIS — N401 Enlarged prostate with lower urinary tract symptoms: Secondary | ICD-10-CM | POA: Diagnosis not present

## 2016-11-30 DIAGNOSIS — R938 Abnormal findings on diagnostic imaging of other specified body structures: Secondary | ICD-10-CM | POA: Diagnosis not present

## 2016-12-03 DIAGNOSIS — G4733 Obstructive sleep apnea (adult) (pediatric): Secondary | ICD-10-CM | POA: Diagnosis not present

## 2016-12-20 DIAGNOSIS — M545 Low back pain: Secondary | ICD-10-CM | POA: Diagnosis not present

## 2016-12-20 DIAGNOSIS — G894 Chronic pain syndrome: Secondary | ICD-10-CM | POA: Diagnosis not present

## 2016-12-29 ENCOUNTER — Ambulatory Visit: Payer: Self-pay | Admitting: Surgery

## 2016-12-29 DIAGNOSIS — K5730 Diverticulosis of large intestine without perforation or abscess without bleeding: Secondary | ICD-10-CM | POA: Diagnosis not present

## 2016-12-29 NOTE — H&P (Signed)
History of Present Illness Donald [name]. Donald Reich MD; 12/29/2016 6:34 PM) The patient is a 61 year old male who presents with an abdominal mass. Second opinion - PCP - Dr. Antony Blackbird  GI - Dr. Dolphus Jenny - [place] [facility] [place] - [place] [facility] - [place]  This is a 61 yo male with chronic low back pain who presented with gross hematuria in 2/18. He saw Dr. Lupita Leash who obtained a CT scan to rule out nephrolithiasis. The scan incidentally noted what appears to be a large cecal diverticulum adjacent to the appendiceal orifice. There was no sign of inflammation or obvious malignancy. He underwent colonoscopy in June 2018 which showed a large stool-filled orifice in the cecum with some impingement on the IC valve. The diverticulum appears to be separate from the appendiceal orifice. The patient reports only occasional minimal twinges of discomfort in the RLQ. He denies any melena or hematochezia. He was referred to surgery in [facility], but felt that surgery was pushed on him without any explanation or discussion. He comes to [place] today for second opinion.   In 2016, he had a colonoscopy in [place], [place] which did not note any cecal diverticula.   Past Surgical History Malachy Moan, [place]; 12/29/2016 8:48 AM) Anal Fissure Repair Colon Polyp Removal - Colonoscopy Knee Surgery Left. Spinal Surgery - Lower Back Spinal Surgery - Neck  Diagnostic Studies History Malachy Moan, [place]; 12/29/2016 8:48 AM) Colonoscopy within last year  Medication History Malachy Moan, RMA; 12/29/2016 8:52 AM) Suboxone (8-2MG  Film, Sublingual) Active. Myrbetriq (25MG  Tablet ER 24HR, Oral) Active. Myrbetriq (50MG  Tablet ER 24HR, Oral) Active. Finasteride (5MG  Tablet, Oral as needed) Active. Meloxicam (15MG  Tablet, Oral) Active. Aspirin (325MG  Capsule, Oral) Active.  Social History Malachy Moan, [place]; 12/29/2016 8:48 AM) Alcohol use Remotely quit alcohol use. Caffeine use  Carbonated beverages, Coffee, Tea. Illicit drug use Remotely quit drug use. Tobacco use Former smoker.  Family History Malachy Moan, [place]; 12/29/2016 8:48 AM) Family history unknown First Degree Relatives  Other Problems Malachy Moan, [place]; 12/29/2016 8:48 AM) Arthritis Back Pain Bladder Problems Enlarged Prostate Gastroesophageal Reflux Disease Hemorrhoids Hepatitis Sleep Apnea Transfusion history     Review of Systems Malachy Moan RMA; 12/29/2016 8:48 AM) General Not Present- Appetite Loss, Chills, Fatigue, Fever, Night Sweats, Weight Gain and Weight Loss. Skin Not Present- Change in Wart/Mole, Dryness, Hives, Jaundice, New Lesions, Non-Healing Wounds, Rash and Ulcer. HEENT Present- Ringing in the Ears, Seasonal Allergies and Wears glasses/contact lenses. Not Present- Earache, Hearing Loss, Hoarseness, Nose Bleed, Oral Ulcers, Sinus Pain, Sore Throat, Visual Disturbances and Yellow Eyes. Respiratory Present- Snoring. Not Present- Bloody sputum, Chronic Cough, Difficulty Breathing and Wheezing. Breast Not Present- Breast Mass, Breast Pain, Nipple Discharge and Skin Changes. Cardiovascular Present- Leg Cramps, Shortness of Breath and Swelling of Extremities. Not Present- Chest Pain, Difficulty Breathing Lying Down, Palpitations and Rapid Heart Rate. Gastrointestinal Present- Bloating, Constipation, Excessive gas and Indigestion. Not Present- Abdominal Pain, Bloody Stool, Change in Bowel Habits, Chronic diarrhea, Difficulty Swallowing, Gets full quickly at meals, Hemorrhoids, Nausea, Rectal Pain and Vomiting. Male Genitourinary Present- Change in Urinary Stream, Frequency, Nocturia, Urgency and Urine Leakage. Not Present- Blood in Urine, Impotence and Painful Urination. Musculoskeletal Present- Back Pain, Joint Stiffness, Muscle Pain and Swelling of Extremities. Not Present- Joint Pain and Muscle Weakness. Neurological Not Present- Decreased Memory, Fainting,  Headaches, Numbness, Seizures, Tingling, Tremor, Trouble walking and Weakness. Psychiatric Not Present- Anxiety, Bipolar, Change in Sleep Pattern, Depression, Fearful and Frequent crying.  Endocrine Present- Hot flashes. Not Present- Cold Intolerance, Excessive Hunger, Hair Changes, Heat Intolerance and New Diabetes. Hematology Present- Blood Thinners and Easy Bruising. Not Present- Excessive bleeding, Gland problems, HIV and Persistent Infections.   Physical Exam [name] K. Nolia Tschantz MD; 12/29/2016 6:34 PM)  The physical exam findings are as follows: Note:WDWN in NAD Eyes: Pupils equal, round; sclera anicteric HENT: Oral mucosa moist; good dentition Neck: No masses palpated, no thyromegaly Lungs: CTA bilaterally; normal respiratory effort CV: Regular rate and rhythm; no murmurs; extremities well-perfused with no edema Abd: +bowel sounds, soft, non-tender, no palpable organomegaly; no palpable hernias Skin: Warm, dry; no sign of jaundice Psychiatric - alert and oriented x 4; calm mood and affect    Assessment & Plan [name] K. Shamar Engelmann MD; 12/29/2016 6:36 PM)  SOLITARY DIVERTICULUM OF RIGHT SIDE OF COLON (K57.30)  Current Plans Schedule for Surgery - Laparoscopic assisted ileocecectomy. The surgical procedure has been discussed with the patient. Potential risks, benefits, alternative treatments, and expected outcomes have been explained. All of the patient's questions at this time have been answered. The likelihood of reaching the patient's treatment goal is good. The patient understand the proposed surgical procedure and wishes to proceed. Note:This is an unusual finding of a large cecal diverticulum filled with stool. I explained to him that it would be difficult to completely rule out malignancy in this area, but the CT scan did not show any suspicious findings. I did recommend laparoscopic-assisted ileocecectomy with primary anastomosis, but there is no urgency to get this scheduled  immediately. He wants to discuss this further with family. We will try to obtain the original CT scan films from radiology in [facility] and we will await his call to schedule surgery.  Donald [name]. Georgette Dover, MD, [facility] Surgery  General/ Trauma Surgery  12/29/2016 6:37 PM

## 2017-01-03 DIAGNOSIS — G4733 Obstructive sleep apnea (adult) (pediatric): Secondary | ICD-10-CM | POA: Diagnosis not present

## 2017-01-23 ENCOUNTER — Ambulatory Visit: Payer: Self-pay | Admitting: Surgery

## 2017-01-26 DIAGNOSIS — E785 Hyperlipidemia, unspecified: Secondary | ICD-10-CM | POA: Diagnosis not present

## 2017-01-26 DIAGNOSIS — R7301 Impaired fasting glucose: Secondary | ICD-10-CM | POA: Diagnosis not present

## 2017-02-03 DIAGNOSIS — G4733 Obstructive sleep apnea (adult) (pediatric): Secondary | ICD-10-CM | POA: Diagnosis not present

## 2017-02-08 DIAGNOSIS — N400 Enlarged prostate without lower urinary tract symptoms: Secondary | ICD-10-CM | POA: Diagnosis not present

## 2017-02-08 DIAGNOSIS — N529 Male erectile dysfunction, unspecified: Secondary | ICD-10-CM | POA: Diagnosis not present

## 2017-02-08 DIAGNOSIS — N401 Enlarged prostate with lower urinary tract symptoms: Secondary | ICD-10-CM | POA: Diagnosis not present

## 2017-02-08 DIAGNOSIS — N3281 Overactive bladder: Secondary | ICD-10-CM | POA: Diagnosis not present

## 2017-02-19 DIAGNOSIS — G894 Chronic pain syndrome: Secondary | ICD-10-CM | POA: Diagnosis not present

## 2017-02-19 DIAGNOSIS — M545 Low back pain: Secondary | ICD-10-CM | POA: Diagnosis not present

## 2017-02-23 IMAGING — US US ABDOMEN COMPLETE W/ ELASTOGRAPHY
1 series · 13 of 25 positions shown · non-contrast
Comparison: 09/24/2009

CLINICAL DATA: Hepatitis-C for more than 20 years. Liver biopsy.
Finished Harvoni treatment 2-3 months ago.



[Series 1: us abdomen complete w/ elastography · 0.19mm/px · 13 of 80 slices shown]
[im 1/80]
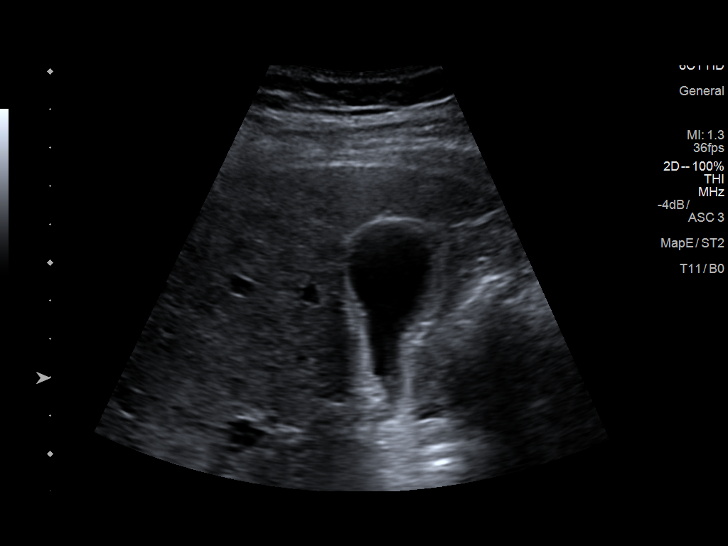
[im 7/80]
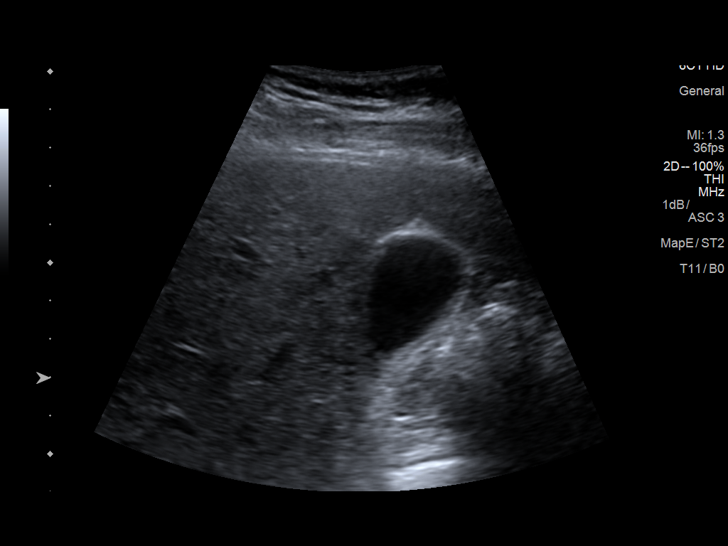
[im 14/80]
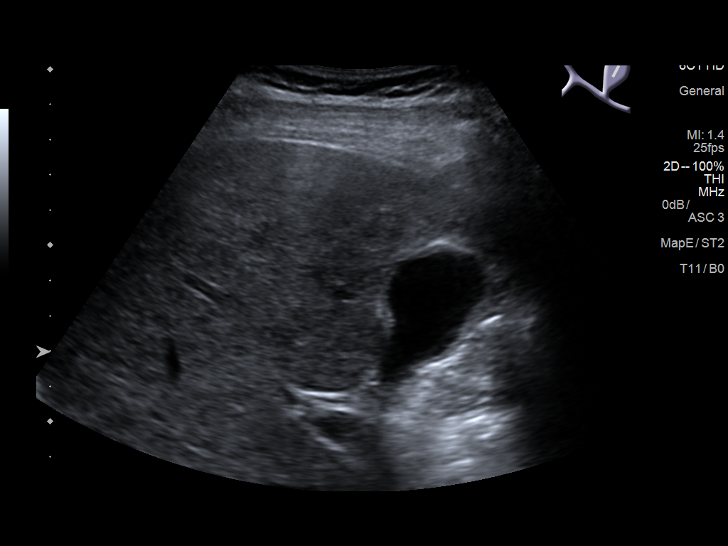
[im 20/80]
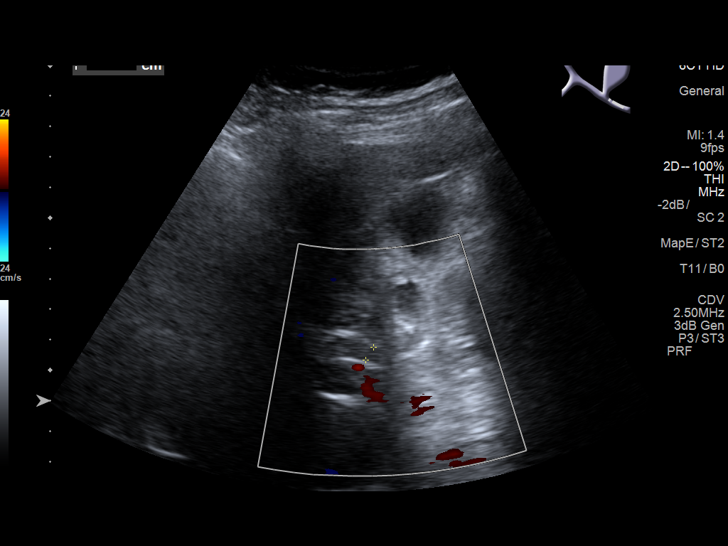
[im 27/80]
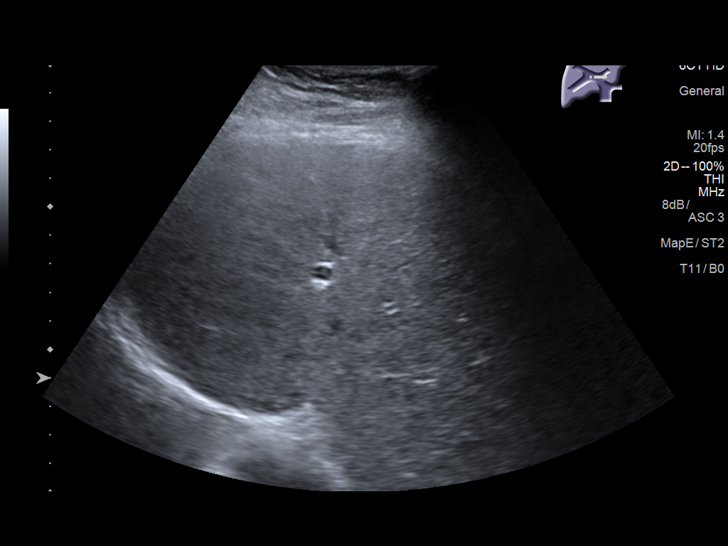
[im 33/80]
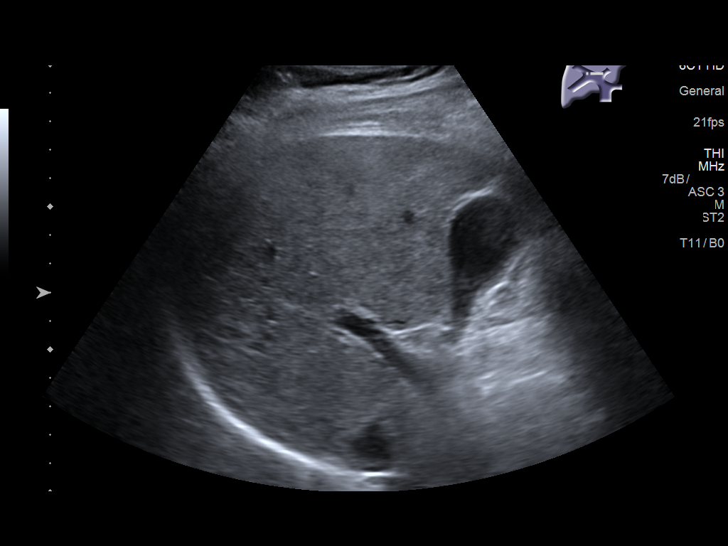
[im 40/80]
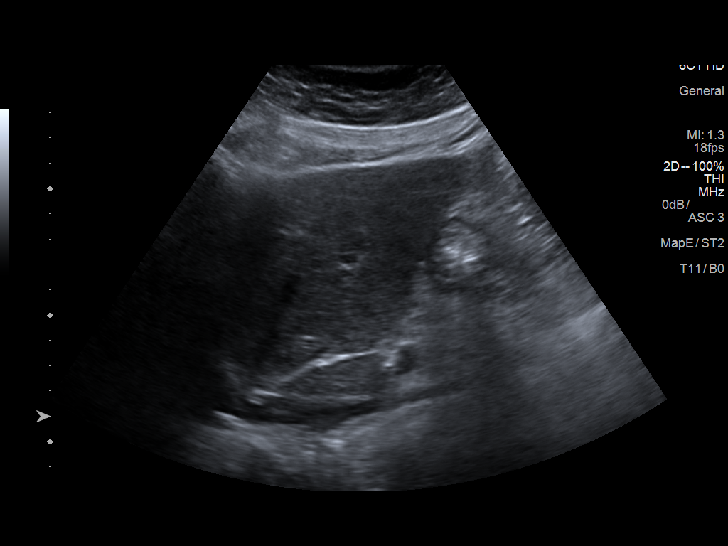
[im 47/80]
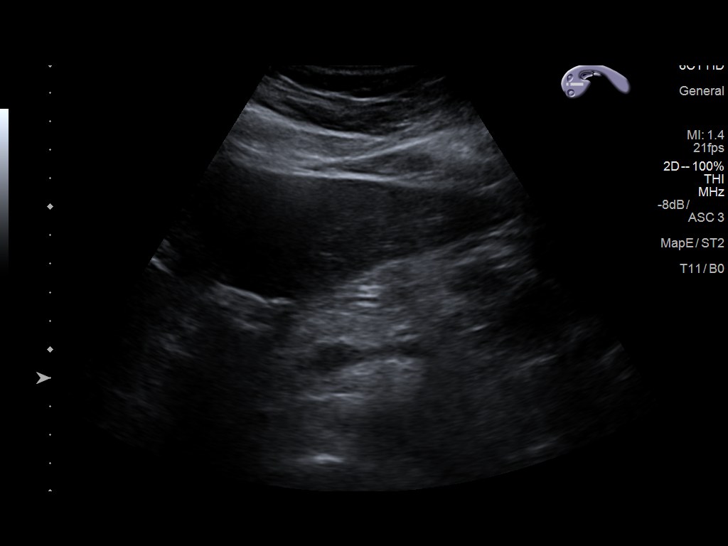
[im 53/80]
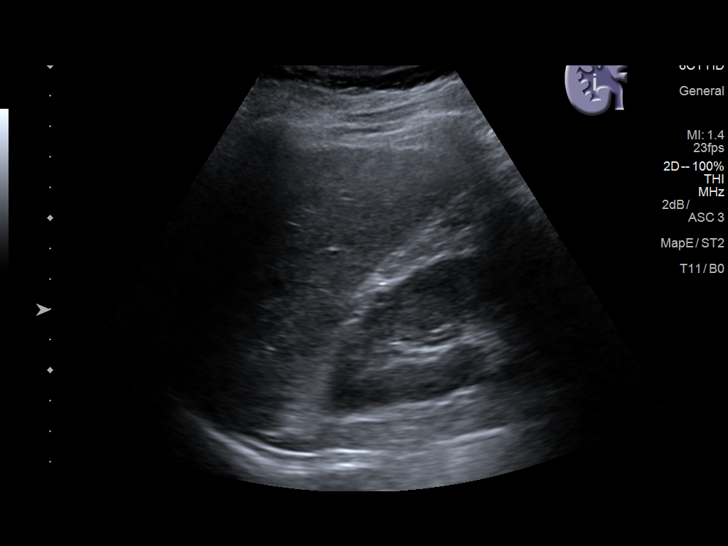
[im 60/80]
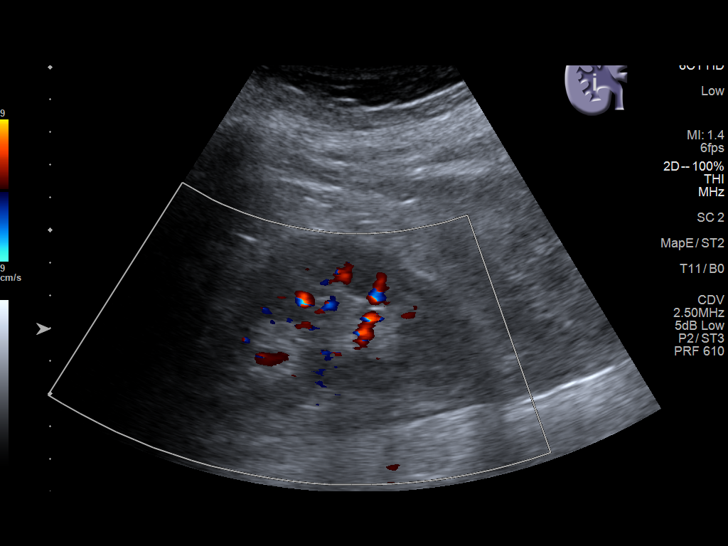
[im 66/80]
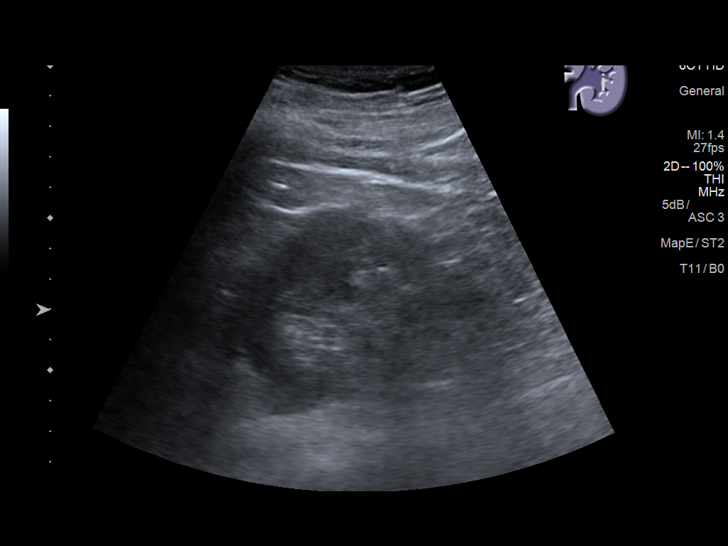
[im 73/80]
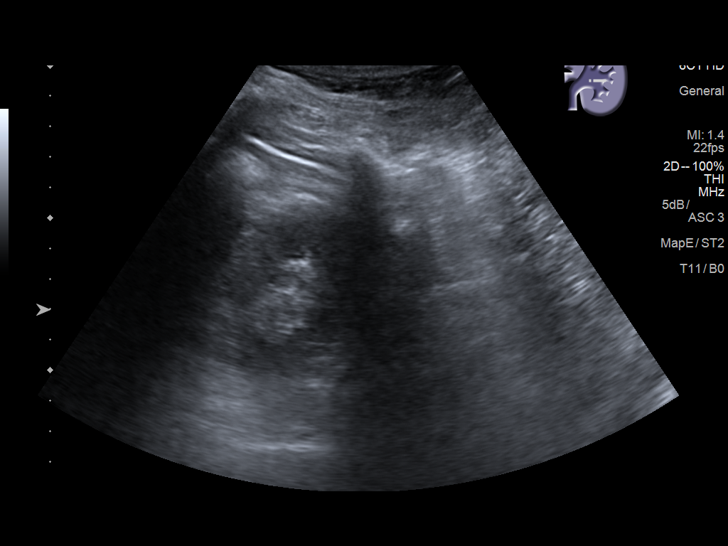
[im 80/80]
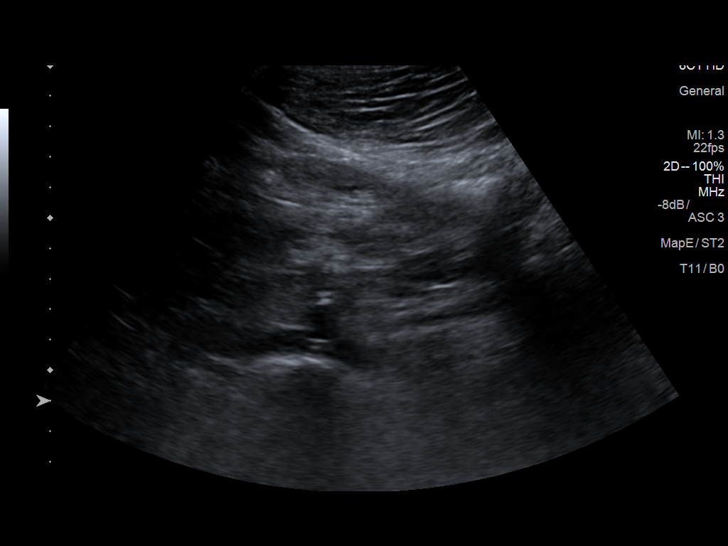

[13 of 25 positions shown; findings below may reference images not displayed]

FINDINGS: ULTRASOUND ABDOMEN

Gallbladder: Gallbladder has a normal appearance. Gallbladder wall
is 2.7 millimeters, within normal limits. No stones or
pericholecystic fluid. No sonographic Murphy's sign.

Common bile duct: Diameter: 4.9 millimeters

Liver: No focal lesion identified. Within normal limits in
parenchymal echogenicity.

IVC: No abnormality visualized.

Pancreas: Visualized portion unremarkable.

Spleen: Size and appearance within normal limits.

Right Kidney: Length: 11.0 centimeters. Echogenicity within normal
limits. No mass or hydronephrosis visualized.

Left Kidney: Length: 10.9 centimeters. Echogenicity within normal
limits. No mass or hydronephrosis visualized.

Abdominal aorta: No aneurysm visualized.

Other findings: None.

ULTRASOUND HEPATIC ELASTOGRAPHY

Device: Siemens Helix VTQ

Patient position:  Left lateral decubitus

Transducer 6C1

Number of measurements:  10

Hepatic Segment:  8

Median velocity:   1.91  m/sec

IQR:

IQR/Median velocity ratio

Corresponding Metavir fibrosis score:  F2 + some F3

Risk of fibrosis: Moderate

Limitations of exam: None

Pertinent findings noted on other imaging exams:  None

Please note that abnormal shear wave velocities may also be
identified in clinical settings other than with hepatic fibrosis,
such as: acute hepatitis, elevated right heart and central venous
pressures including use of beta blockers, Meadows disease
(Rene Andres), infiltrative processes such as
mastocytosis/amyloidosis/infiltrative tumor, extrahepatic
cholestasis, in the post-prandial state, and liver transplantation.
Correlation with patient history, laboratory data, and clinical
condition recommended.
IMPRESSION: 1. No acute abnormalities identified in the abdomen.
2. Median hepatic shear wave velocity is calculated at 1.91 m/sec.
3. Corresponding Metavir fibrosis score is F2 + some F3.
4. Risk of fibrosis is moderate.
5. Follow-up:  Additional testing is considered appropriate.

## 2017-03-01 DIAGNOSIS — N3281 Overactive bladder: Secondary | ICD-10-CM | POA: Diagnosis not present

## 2017-03-28 ENCOUNTER — Ambulatory Visit: Payer: Commercial Managed Care - HMO | Admitting: Neurology

## 2017-03-28 DIAGNOSIS — N401 Enlarged prostate with lower urinary tract symptoms: Secondary | ICD-10-CM | POA: Diagnosis not present

## 2017-03-28 DIAGNOSIS — N3281 Overactive bladder: Secondary | ICD-10-CM | POA: Diagnosis not present

## 2017-03-28 DIAGNOSIS — N529 Male erectile dysfunction, unspecified: Secondary | ICD-10-CM | POA: Diagnosis not present

## 2017-05-04 DIAGNOSIS — K5730 Diverticulosis of large intestine without perforation or abscess without bleeding: Secondary | ICD-10-CM | POA: Diagnosis not present

## 2017-05-05 ENCOUNTER — Ambulatory Visit: Payer: Self-pay | Admitting: Surgery

## 2017-05-05 NOTE — H&P (Signed)
History of Present Illness [name]. Donald Mcgraw MD; 05/05/2017 11:31 AM)  The patient is a 61 year old male who presents with abdominal pain. The patient is a 61 year old male who presents with an abdominal mass. Second opinion - PCP - Dr. Antony [name]    GI - Dr. Dolphus [name] - [place]  GU - [name] - [place]  Gen Surgery - [name] - [place]    This is a 61 yo male with chronic low back pain who presented with gross hematuria in 2/18. He saw Dr. Lupita [name] who obtained a CT scan to rule out nephrolithiasis. The scan incidentally noted what appears to be a large cecal diverticulum adjacent to the appendiceal orifice. There was no sign of inflammation or obvious malignancy. He underwent colonoscopy in June 2018 which showed a large stool-filled orifice in the cecum with some impingement on the IC valve. The diverticulum appears to be separate from the appendiceal orifice. The patient reports only occasional minimal twinges of discomfort in the RLQ. He denies any melena or hematochezia. He was referred to surgery in [name], but felt that surgery was pushed on him without any explanation or discussion. He came to [place] earlier this year for second opinion. I recommended laparoscopic-assisted ileocecectomy, but the patient did not schedule his surgery. We have obtained and reviewed all of his previous records and scans. Over the last several weeks, he has had episodes of increasing tenderness in the right lower quadrant. He wants to go ahead and schedule surgery now.    In 2016, he had a colonoscopy in [name], [place] which did not note any cecal diverticula.      Problem List/Past Medical [place] K. Anabelen Kaminsky, MD; 05/05/2017 11:33 AM)  SOLITARY DIVERTICULUM OF RIGHT SIDE OF COLON (K57.30)    Past Surgical History ([name] K. Oshea Percival, MD; 05/05/2017 11:33 AM)  Anal Fissure Repair  Colon Polyp Removal - Colonoscopy  Knee Surgery Left.  Spinal Surgery - Lower Back  Spinal Surgery -  Neck    Diagnostic Studies History [name]. Donald Hooper, MD; 05/05/2017 11:33 AM)  Colonoscopy within last year    Allergies [name]; 67/67/2094 7:09 AM)  DULoxetine HCl *ANTIDEPRESSANTS* Nausea.  Flexeril *MUSCULOSKELETAL THERAPY AGENTS* Hives.  Latex Rash.  Adhesive Tape Rash.  Allergies Reconciled    Medication History [name]; 62/83/6629 4:76 AM)  Omeprazole (40MG  Capsule DR, Oral) Active.  Neomycin Sulfate (500MG  Tablet, 2 (two) Tablet Oral SEE NOTE, Taken starting 01/23/2017) Active. (TAKE TWO TABLETS AT 2 PM, 3 PM, AND 10 PM THE DAY PRIOR TO SURGERY)  Flagyl (500MG  Tablet, 2 (two) Tablet Oral SEE NOTE, Taken starting 01/23/2017) Active. (Take at 2pm, 3pm, and 10pm the day prior to your colon operation)  Suboxone (8-2MG  Film, Sublingual) Active.  Myrbetriq (25MG  Tablet ER 24HR, Oral) Active.  Finasteride (5MG  Tablet, Oral as needed) Active.  Meloxicam (15MG  Tablet, Oral) Active.  Aspirin (325MG  Capsule, Oral) Active.  Medications Reconciled  Voltaren (1% Gel, Transdermal) Active.    Social History [name]. Donald Mccreadie, MD; 05/05/2017 11:33 AM)  Alcohol use Remotely quit alcohol use.  Caffeine use Carbonated beverages, Coffee, Tea.  Illicit drug use Remotely quit drug use.  Tobacco use Former smoker.    Family History [name]. Donald Medlen, MD; 05/05/2017 11:33 AM)  Family history unknown  First Degree Relatives    Other Problems [name]. Donald Lacina, MD; 05/05/2017 11:33 AM)  Arthritis  Back Pain  Bladder Problems  Enlarged Prostate  Gastroesophageal Reflux Disease  Hemorrhoids  Hepatitis  Sleep Apnea  Transfusion history        Vitals Donald Billings Dockery [name]; 70/96/2836 6:29 AM)  05/04/2017 9:30 AM  Weight: 239.2 lb Height: 68in  Body Surface Area: 2.21 m Body Mass Index: 36.37 kg/m   Temp.: 98.30F(Oral)  Pulse: 82 (Regular)   BP: 126/78 (Sitting, Left Arm,  Standard)            Physical Exam [place] K. Derrisha Foos MD; 05/05/2017 11:32 AM)    The physical exam findings are as follows:  Note:WDWN in NAD  Eyes: Pupils equal, round; sclera anicteric  HENT: Oral mucosa moist; good dentition  Neck: No masses palpated, no thyromegaly  Lungs: CTA bilaterally; normal respiratory effort  CV: Regular rate and rhythm; no murmurs; extremities well-perfused with no edema  Abd: +bowel sounds, soft, minmal RLQ tenderness, no palpable organomegaly; no palpable hernias  Skin: Warm, dry; no sign of jaundice  Psychiatric - alert and oriented x 4; calm mood and affect        Assessment & Plan [place] K. Sherre Wooton MD; 05/05/2017 11:33 AM)    SOLITARY DIVERTICULUM OF RIGHT SIDE OF COLON (K57.30)    Current Plans  Schedule for Surgery - Laparoscopic-assisted ileocecectomy. The surgical procedure has been discussed with the patient. Potential risks, benefits, alternative treatments, and expected outcomes have been explained. All of the patient's questions at this time have been answered. The likelihood of reaching the patient's treatment goal is good. The patient understand the proposed surgical procedure and wishes to proceed.  Note:This is an unusual finding of a large cecal diverticulum filled with stool. I explained to him that it would be difficult to completely rule out malignancy in this area, but the CT scan did not show any suspicious findings. I did recommend laparoscopic-assisted ileocecectomy with primary anastomosis.   [name]. [place] Dover, MD, [facility]  General/ Trauma Surgery  05/05/2017 11:34 AM

## 2017-05-25 DIAGNOSIS — R600 Localized edema: Secondary | ICD-10-CM | POA: Diagnosis not present

## 2017-05-25 DIAGNOSIS — L723 Sebaceous cyst: Secondary | ICD-10-CM | POA: Diagnosis not present

## 2017-05-31 DIAGNOSIS — R600 Localized edema: Secondary | ICD-10-CM | POA: Diagnosis not present

## 2017-06-19 NOTE — Pre-Procedure Instructions (Signed)
[name]  06/19/2017      [facility] - [name], [name] - [name]  [address] Phone: [phone] Fax: [phone]    Your procedure is scheduled on January 22  Report to [place] at 0600 A.M.  Call this number if you have problems the morning of surgery:  [phone]   Remember:  Do not eat food or drink liquids after midnight.  Continue all medications as directed by your physician except follow these medication instructions before surgery below   Take these medicines the morning of surgery with A SIP OF WATER   MYRBETRIQ omeprazole (PRILOSEC)   7 days prior to surgery STOP taking any Aspirin(unless otherwise instructed by your surgeon), Aleve, Naproxen, Ibuprofen, Motrin, Advil, Goody's, BC's, all herbal medications, fish oil, and all vitamins diclofenac sodium (VOLTAREN    Do not wear jewelry  Do not wear lotions, powders, or cologne, or deodorant.   Men may shave face and neck.  Do not bring valuables to the hospital.  [facility] is not responsible for any belongings or valuables.  Contacts, dentures or bridgework may not be worn into surgery.  Leave your suitcase in the car.  After surgery it may be brought to your room.  For patients admitted to the hospital, discharge time will be determined by your treatment team.  Patients discharged the day of surgery will not be allowed to drive home.    Special instructions:   [name]- Preparing For Surgery  Before surgery, you can play an important role. Because skin is not sterile, your skin needs to be as free of germs as possible. You can reduce the number of germs on your skin by washing with CHG (chlorahexidine gluconate) Soap before surgery.  CHG is an antiseptic cleaner which kills germs and bonds with the skin to continue killing germs even after washing.  Please do not use if you  have an allergy to CHG or antibacterial soaps. If your skin becomes reddened/irritated stop using the CHG.  Do not shave (including legs and underarms) for at least 48 hours prior to first CHG shower. It is OK to shave your face.  Please follow these instructions carefully.   1. Shower the NIGHT BEFORE SURGERY and the MORNING OF SURGERY with CHG.   2. If you chose to wash your hair, wash your hair first as usual with your normal shampoo.  3. After you shampoo, rinse your hair and body thoroughly to remove the shampoo.  4. Use CHG as you would any other liquid soap. You can apply CHG directly to the skin and wash gently with a scrungie or a clean washcloth.   5. Apply the CHG Soap to your body ONLY FROM THE NECK DOWN.  Do not use on open wounds or open sores. Avoid contact with your eyes, ears, mouth and genitals (private parts). Wash Face and genitals (private parts)  with your normal soap.  6. Wash thoroughly, paying special attention to the area where your surgery will be performed.  7. Thoroughly rinse your body with warm water from the neck down.  8. DO NOT shower/wash with your normal soap after using and rinsing off the CHG Soap.  9. Pat yourself dry with a CLEAN TOWEL.  10. Wear CLEAN PAJAMAS to bed the night before surgery, wear comfortable clothes the morning of surgery  11. Place CLEAN SHEETS on  your bed the night of your first shower and DO NOT SLEEP WITH PETS.    Day of Surgery: Do not apply any deodorants/lotions. Please wear clean clothes to the hospital/surgery center.      Please read over the following fact sheets that you were given.

## 2017-06-20 ENCOUNTER — Other Ambulatory Visit: Payer: Self-pay

## 2017-06-20 ENCOUNTER — Encounter (HOSPITAL_COMMUNITY): Payer: Self-pay

## 2017-06-20 ENCOUNTER — Encounter (HOSPITAL_COMMUNITY)
Admission: RE | Admit: 2017-06-20 | Discharge: 2017-06-20 | Disposition: A | Discharge status: Home / Self Care | Payer: Medicare HMO | Source: Ambulatory Visit | Attending: Surgery | Admitting: Surgery

## 2017-06-20 DIAGNOSIS — Z87891 Personal history of nicotine dependence: Secondary | ICD-10-CM | POA: Insufficient documentation

## 2017-06-20 DIAGNOSIS — K219 Gastro-esophageal reflux disease without esophagitis: Secondary | ICD-10-CM | POA: Insufficient documentation

## 2017-06-20 DIAGNOSIS — K5730 Diverticulosis of large intestine without perforation or abscess without bleeding: Secondary | ICD-10-CM | POA: Insufficient documentation

## 2017-06-20 DIAGNOSIS — F329 Major depressive disorder, single episode, unspecified: Secondary | ICD-10-CM | POA: Insufficient documentation

## 2017-06-20 DIAGNOSIS — E119 Type 2 diabetes mellitus without complications: Secondary | ICD-10-CM | POA: Diagnosis not present

## 2017-06-20 DIAGNOSIS — Z01812 Encounter for preprocedural laboratory examination: Secondary | ICD-10-CM | POA: Diagnosis not present

## 2017-06-20 DIAGNOSIS — F4310 Post-traumatic stress disorder, unspecified: Secondary | ICD-10-CM | POA: Diagnosis not present

## 2017-06-20 DIAGNOSIS — Z7982 Long term (current) use of aspirin: Secondary | ICD-10-CM | POA: Diagnosis not present

## 2017-06-20 DIAGNOSIS — G4733 Obstructive sleep apnea (adult) (pediatric): Secondary | ICD-10-CM | POA: Diagnosis not present

## 2017-06-20 DIAGNOSIS — E669 Obesity, unspecified: Secondary | ICD-10-CM | POA: Diagnosis not present

## 2017-06-20 DIAGNOSIS — E785 Hyperlipidemia, unspecified: Secondary | ICD-10-CM | POA: Diagnosis not present

## 2017-06-20 DIAGNOSIS — Z6834 Body mass index (BMI) 34.0-34.9, adult: Secondary | ICD-10-CM | POA: Diagnosis not present

## 2017-06-20 LAB — COMPREHENSIVE METABOLIC PANEL
ALBUMIN: 3.7 g/dL (ref 3.5–5.0)
ALT: 17 U/L (ref 17–63)
AST: 21 U/L (ref 15–41)
Alkaline Phosphatase: 53 U/L (ref 38–126)
Anion gap: 7 (ref 5–15)
BILIRUBIN TOTAL: 0.5 mg/dL (ref 0.3–1.2)
BUN: 16 mg/dL (ref 6–20)
CO2: 29 mmol/L (ref 22–32)
Calcium: 9.1 mg/dL (ref 8.9–10.3)
Chloride: 105 mmol/L (ref 101–111)
Creatinine, Ser: 1.09 mg/dL (ref 0.61–1.24)
GFR calc Af Amer: >60 mL/min (ref >60)
GFR calc non Af Amer: >60 mL/min (ref >60)
Glucose, Bld: 134 mg/dL — ABNORMAL HIGH (ref 65–99)
POTASSIUM: 4.5 mmol/L (ref 3.5–5.1)
Sodium: 141 mmol/L (ref 135–145)
TOTAL PROTEIN: 7.5 g/dL (ref 6.5–8.1)

## 2017-06-20 LAB — CBC
HEMATOCRIT: 41.4 % (ref 39.0–52.0)
HEMOGLOBIN: 13.5 g/dL (ref 13.0–17.0)
MCH: 29.2 pg (ref 26.0–34.0)
MCHC: 32.6 g/dL (ref 30.0–36.0)
MCV: 89.4 fL (ref 78.0–100.0)
Platelets: 200 10*3/uL (ref 150–400)
RBC: 4.63 MIL/uL (ref 4.22–5.81)
RDW: 12.4 % (ref 11.5–15.5)
WBC: 5.7 10*3/uL (ref 4.0–10.5)

## 2017-06-20 NOTE — Progress Notes (Signed)
PCP: Dr. Delfina Redwood No cardiologist Pain Management: Dr. Mirna Mires @ [facility] in [place], [place]  Pt. To call Dr. Mirna Mires to advise if stopping suboxone 3 days prior to surgery is okay Pt. To call Dr. Gershon Crane when to stop aspirin  Pt. Has bowel prep instructions from Dr. Kellie Moor.

## 2017-06-21 NOTE — Progress Notes (Signed)
Anesthesia Chart Review: Patient is a 62 year old male scheduled for laparoscopic assisted ileocecectomy on 06/26/17 by Dr. Donnie Mesa. Dx: Large cecal diverticulum.  History includes former smoker (quit '92), PTSD, depression, HLD, GERD, chronic back pain, borderline DM, hepatitis C (after '97 transfusion; cure s/p Harvoni treatment '17), OSA (non-compliant CPAP), ED, overactive bladder, BPH, cervical and lumbar fusion. BMI is consistent with obesity.   PCP is [name]. Pain Management is Dr. Brandy Hale (anesthesiologist) at [facility] in [place], [place]. Neurologist is Dr. Andrey Spearman.  Meds include ASA 81 mg (holding as of 06/21/17), Suboxone 8-2 mg SL (to hold for 3 days prior to surgery if okay with Dr. Mirna Mires), Flonase, Myrbetriq, Prilosec.  BP 131/81   Pulse 82   Temp 36.8 C   Resp 20   Ht 5\' 10"  (1.778 m)   Wt 242 lb (109.8 kg)   SpO2 100%   BMI 34.72 kg/m   No EKG done at PAT (as no HTN, CAD history). Was told he was borderline diabetic in the past, but sounds like no formal DM2 diagnosis, so was not felt indicated per anesthesia guidelines. Patient reported no EKG within the past year. Denied SOB and chest pain. If anesthesiologist feels EKG is warranted pre-operatively then would need to get on the day of surgery.   Nuclear stress test 06/15/08: Normal stress nuclear study. EF 67%.   Preoperative labs noted. Cr 1.09, non-fasting glucose 134. CBC WNL.   Based on currently available information, I would anticipate that he can proceed as planned if no acute changes.  George Hugh [facility] Short Stay Center/Anesthesiology Phone [phone] 06/21/2017 1:56 PM

## 2017-06-26 ENCOUNTER — Inpatient Hospital Stay (HOSPITAL_COMMUNITY)
Admission: RE | Admit: 2017-06-26 | Discharge: 2017-07-01 | DRG: 331 | Disposition: A | Discharge status: Home / Self Care | Payer: Medicare HMO | Source: Ambulatory Visit | Attending: Surgery | Admitting: Surgery

## 2017-06-26 ENCOUNTER — Other Ambulatory Visit: Payer: Self-pay

## 2017-06-26 ENCOUNTER — Inpatient Hospital Stay (HOSPITAL_COMMUNITY): Payer: Medicare HMO | Admitting: Vascular Surgery

## 2017-06-26 ENCOUNTER — Encounter (HOSPITAL_COMMUNITY): Payer: Self-pay | Admitting: Surgery

## 2017-06-26 ENCOUNTER — Inpatient Hospital Stay (HOSPITAL_COMMUNITY): Payer: Medicare HMO | Admitting: Anesthesiology

## 2017-06-26 ENCOUNTER — Encounter (HOSPITAL_COMMUNITY)
Admission: RE | Disposition: A | Discharge status: Home / Self Care | Payer: Self-pay | Source: Ambulatory Visit | Attending: Surgery

## 2017-06-26 DIAGNOSIS — J029 Acute pharyngitis, unspecified: Secondary | ICD-10-CM | POA: Diagnosis not present

## 2017-06-26 DIAGNOSIS — R269 Unspecified abnormalities of gait and mobility: Secondary | ICD-10-CM | POA: Diagnosis not present

## 2017-06-26 DIAGNOSIS — Z79899 Other long term (current) drug therapy: Secondary | ICD-10-CM | POA: Diagnosis not present

## 2017-06-26 DIAGNOSIS — Z87891 Personal history of nicotine dependence: Secondary | ICD-10-CM

## 2017-06-26 DIAGNOSIS — Z888 Allergy status to other drugs, medicaments and biological substances status: Secondary | ICD-10-CM

## 2017-06-26 DIAGNOSIS — K6389 Other specified diseases of intestine: Secondary | ICD-10-CM | POA: Diagnosis not present

## 2017-06-26 DIAGNOSIS — Z7982 Long term (current) use of aspirin: Secondary | ICD-10-CM | POA: Diagnosis not present

## 2017-06-26 DIAGNOSIS — R1900 Intra-abdominal and pelvic swelling, mass and lump, unspecified site: Secondary | ICD-10-CM | POA: Diagnosis present

## 2017-06-26 DIAGNOSIS — K219 Gastro-esophageal reflux disease without esophagitis: Secondary | ICD-10-CM | POA: Diagnosis not present

## 2017-06-26 DIAGNOSIS — K5730 Diverticulosis of large intestine without perforation or abscess without bleeding: Principal | ICD-10-CM | POA: Diagnosis present

## 2017-06-26 DIAGNOSIS — Z91040 Latex allergy status: Secondary | ICD-10-CM | POA: Diagnosis not present

## 2017-06-26 DIAGNOSIS — Z91018 Allergy to other foods: Secondary | ICD-10-CM

## 2017-06-26 DIAGNOSIS — E785 Hyperlipidemia, unspecified: Secondary | ICD-10-CM | POA: Diagnosis not present

## 2017-06-26 DIAGNOSIS — E119 Type 2 diabetes mellitus without complications: Secondary | ICD-10-CM | POA: Diagnosis not present

## 2017-06-26 DIAGNOSIS — B1920 Unspecified viral hepatitis C without hepatic coma: Secondary | ICD-10-CM | POA: Diagnosis not present

## 2017-06-26 DIAGNOSIS — G4733 Obstructive sleep apnea (adult) (pediatric): Secondary | ICD-10-CM | POA: Diagnosis not present

## 2017-06-26 HISTORY — PX: LAPAROSCOPIC ILEOCECECTOMY: SHX5898

## 2017-06-26 SURGERY — EXCISION, CECUM WITH ILEUM, LAPAROSCOPIC
Anesthesia: General | Site: Abdomen

## 2017-06-26 MED ORDER — HYDROMORPHONE HCL 1 MG/ML IJ SOLN
1.0000 mg | INTRAMUSCULAR | Status: DC | PRN
  Administered 2017-06-26 (×3): 1 mg via INTRAVENOUS
  Filled 2017-06-26: qty 1

## 2017-06-26 MED ORDER — MIDAZOLAM HCL 2 MG/2ML IJ SOLN
INTRAMUSCULAR | Status: AC
  Filled 2017-06-26: qty 2

## 2017-06-26 MED ORDER — BUPIVACAINE-EPINEPHRINE (PF) 0.5% -1:200000 IJ SOLN
INTRAMUSCULAR | Status: AC
  Filled 2017-06-26: qty 30

## 2017-06-26 MED ORDER — ROCURONIUM BROMIDE 10 MG/ML (PF) SYRINGE
PREFILLED_SYRINGE | INTRAVENOUS | Status: AC
  Filled 2017-06-26: qty 5

## 2017-06-26 MED ORDER — HYDROMORPHONE HCL 1 MG/ML IJ SOLN
INTRAMUSCULAR | Status: AC
  Administered 2017-06-26: 0.5 mg via INTRAVENOUS
  Filled 2017-06-26: qty 1

## 2017-06-26 MED ORDER — CHLORHEXIDINE GLUCONATE CLOTH 2 % EX PADS: 6.0000 | MEDICATED_PAD | Freq: Once | CUTANEOUS | Status: DC

## 2017-06-26 MED ORDER — PROPOFOL 10 MG/ML IV BOLUS
INTRAVENOUS | Status: AC
  Filled 2017-06-26: qty 20

## 2017-06-26 MED ORDER — DEXTROSE 5 % IV SOLN
2.0000 g | Freq: Two times a day (BID) | INTRAVENOUS | Status: AC
  Administered 2017-06-26: 2 g via INTRAVENOUS
  Filled 2017-06-26: qty 2

## 2017-06-26 MED ORDER — DIPHENHYDRAMINE HCL 12.5 MG/5ML PO ELIX: 12.5000 mg | ORAL_SOLUTION | Freq: Four times a day (QID) | ORAL | Status: DC | PRN

## 2017-06-26 MED ORDER — DEXAMETHASONE SODIUM PHOSPHATE 10 MG/ML IJ SOLN
INTRAMUSCULAR | Status: AC
  Filled 2017-06-26: qty 1

## 2017-06-26 MED ORDER — SUCCINYLCHOLINE CHLORIDE 200 MG/10ML IV SOSY
PREFILLED_SYRINGE | INTRAVENOUS | Status: AC
  Filled 2017-06-26: qty 10

## 2017-06-26 MED ORDER — DIPHENHYDRAMINE HCL 50 MG/ML IJ SOLN: 12.5000 mg | Freq: Four times a day (QID) | INTRAMUSCULAR | Status: DC | PRN

## 2017-06-26 MED ORDER — FENTANYL CITRATE (PF) 100 MCG/2ML IJ SOLN
INTRAMUSCULAR | Status: AC
  Administered 2017-06-26: 50 ug via INTRAVENOUS
  Filled 2017-06-26: qty 2

## 2017-06-26 MED ORDER — KETOROLAC TROMETHAMINE 30 MG/ML IJ SOLN
30.0000 mg | Freq: Four times a day (QID) | INTRAMUSCULAR | Status: DC
  Administered 2017-06-26 – 2017-06-30 (×14): 30 mg via INTRAVENOUS
  Filled 2017-06-26 (×13): qty 1

## 2017-06-26 MED ORDER — PHENYLEPHRINE 40 MCG/ML (10ML) SYRINGE FOR IV PUSH (FOR BLOOD PRESSURE SUPPORT)
PREFILLED_SYRINGE | INTRAVENOUS | Status: AC
  Filled 2017-06-26: qty 10

## 2017-06-26 MED ORDER — FENTANYL CITRATE (PF) 250 MCG/5ML IJ SOLN
INTRAMUSCULAR | Status: AC
  Filled 2017-06-26: qty 5

## 2017-06-26 MED ORDER — SUGAMMADEX SODIUM 200 MG/2ML IV SOLN
INTRAVENOUS | Status: AC
  Filled 2017-06-26: qty 4

## 2017-06-26 MED ORDER — FENTANYL CITRATE (PF) 100 MCG/2ML IJ SOLN
50.0000 ug | INTRAMUSCULAR | Status: DC | PRN
  Administered 2017-06-26: 50 ug via INTRAVENOUS

## 2017-06-26 MED ORDER — PROMETHAZINE HCL 25 MG/ML IJ SOLN
6.2500 mg | INTRAMUSCULAR | Status: AC | PRN
  Administered 2017-06-26 (×2): 12.5 mg via INTRAVENOUS

## 2017-06-26 MED ORDER — BUPIVACAINE LIPOSOME 1.3 % IJ SUSP
INTRAMUSCULAR | Status: DC | PRN
  Administered 2017-06-26: 40 mL

## 2017-06-26 MED ORDER — LIDOCAINE HCL (CARDIAC) 20 MG/ML IV SOLN
INTRAVENOUS | Status: DC | PRN
  Administered 2017-06-26: 80 mg via INTRAVENOUS

## 2017-06-26 MED ORDER — ENOXAPARIN SODIUM 40 MG/0.4ML ~~LOC~~ SOLN
40.0000 mg | SUBCUTANEOUS | Status: DC
  Administered 2017-06-27 – 2017-07-01 (×5): 40 mg via SUBCUTANEOUS
  Filled 2017-06-26 (×5): qty 0.4

## 2017-06-26 MED ORDER — ONDANSETRON HCL 4 MG/2ML IJ SOLN
INTRAMUSCULAR | Status: DC | PRN
  Administered 2017-06-26: 4 mg via INTRAVENOUS

## 2017-06-26 MED ORDER — BUPIVACAINE LIPOSOME 1.3 % IJ SUSP
20.0000 mL | Freq: Once | INTRAMUSCULAR | Status: DC
  Filled 2017-06-26: qty 20

## 2017-06-26 MED ORDER — HYDROMORPHONE 1 MG/ML IV SOLN
INTRAVENOUS | Status: DC
  Administered 2017-06-26: 18:00:00 via INTRAVENOUS
  Administered 2017-06-26: 1 mg via INTRAVENOUS
  Administered 2017-06-27: 0.9 mL via INTRAVENOUS
  Administered 2017-06-27: 1.2 mL via INTRAVENOUS
  Administered 2017-06-27: 2 mg via INTRAVENOUS
  Administered 2017-06-27: 4 mg via INTRAVENOUS
  Administered 2017-06-27: 1 mg via INTRAVENOUS
  Administered 2017-06-27: 0 mL via INTRAVENOUS
  Administered 2017-06-27: 0 mg via INTRAVENOUS
  Filled 2017-06-26: qty 25

## 2017-06-26 MED ORDER — MIDAZOLAM HCL 5 MG/5ML IJ SOLN
INTRAMUSCULAR | Status: DC | PRN
  Administered 2017-06-26: 2 mg via INTRAVENOUS

## 2017-06-26 MED ORDER — POTASSIUM CHLORIDE IN NACL 20-0.9 MEQ/L-% IV SOLN
INTRAVENOUS | Status: DC
  Administered 2017-06-26 – 2017-06-29 (×6): via INTRAVENOUS
  Filled 2017-06-26 (×8): qty 1000

## 2017-06-26 MED ORDER — ONDANSETRON HCL 4 MG/2ML IJ SOLN: 4.0000 mg | Freq: Four times a day (QID) | INTRAMUSCULAR | Status: DC | PRN

## 2017-06-26 MED ORDER — ALVIMOPAN 12 MG PO CAPS
12.0000 mg | ORAL_CAPSULE | ORAL | Status: AC
  Administered 2017-06-26: 12 mg via ORAL
  Filled 2017-06-26: qty 1

## 2017-06-26 MED ORDER — SUGAMMADEX SODIUM 200 MG/2ML IV SOLN
INTRAVENOUS | Status: DC | PRN
Start: 2017-06-26 — End: 2017-06-26
  Administered 2017-06-26: 219.6 mg via INTRAVENOUS

## 2017-06-26 MED ORDER — MIRABEGRON ER 25 MG PO TB24
25.0000 mg | ORAL_TABLET | Freq: Every day | ORAL | Status: DC
  Administered 2017-06-26 – 2017-07-01 (×5): 25 mg via ORAL
  Filled 2017-06-26 (×7): qty 1

## 2017-06-26 MED ORDER — PROMETHAZINE HCL 25 MG/ML IJ SOLN
INTRAMUSCULAR | Status: AC
  Administered 2017-06-26: 12.5 mg via INTRAVENOUS
  Filled 2017-06-26: qty 1

## 2017-06-26 MED ORDER — ONDANSETRON 4 MG PO TBDP
4.0000 mg | ORAL_TABLET | Freq: Four times a day (QID) | ORAL | Status: DC | PRN
  Administered 2017-06-28: 4 mg via ORAL
  Filled 2017-06-26: qty 1

## 2017-06-26 MED ORDER — HYDROMORPHONE HCL 1 MG/ML IJ SOLN
0.2500 mg | INTRAMUSCULAR | Status: DC | PRN
  Administered 2017-06-26 (×4): 0.5 mg via INTRAVENOUS

## 2017-06-26 MED ORDER — NALOXONE HCL 0.4 MG/ML IJ SOLN: 0.4000 mg | INTRAMUSCULAR | Status: DC | PRN

## 2017-06-26 MED ORDER — PROPOFOL 10 MG/ML IV BOLUS
INTRAVENOUS | Status: DC | PRN
  Administered 2017-06-26: 50 mg via INTRAVENOUS
  Administered 2017-06-26: 150 mg via INTRAVENOUS

## 2017-06-26 MED ORDER — LACTATED RINGERS IV SOLN
INTRAVENOUS | Status: DC
  Administered 2017-06-26 (×4): via INTRAVENOUS

## 2017-06-26 MED ORDER — ROCURONIUM BROMIDE 100 MG/10ML IV SOLN
INTRAVENOUS | Status: DC | PRN
  Administered 2017-06-26: 30 mg via INTRAVENOUS
  Administered 2017-06-26: 10 mg via INTRAVENOUS
  Administered 2017-06-26: 20 mg via INTRAVENOUS
  Administered 2017-06-26: 60 mg via INTRAVENOUS
  Administered 2017-06-26: 10 mg via INTRAVENOUS

## 2017-06-26 MED ORDER — CEFOTETAN DISODIUM-DEXTROSE 2-2.08 GM-%(50ML) IV SOLR
2.0000 g | INTRAVENOUS | Status: AC
  Administered 2017-06-26: 2 g via INTRAVENOUS
  Filled 2017-06-26: qty 50

## 2017-06-26 MED ORDER — HYDROMORPHONE HCL 1 MG/ML IJ SOLN
INTRAMUSCULAR | Status: AC
  Administered 2017-06-26: 1 mg via INTRAVENOUS
  Filled 2017-06-26: qty 1

## 2017-06-26 MED ORDER — FENTANYL CITRATE (PF) 100 MCG/2ML IJ SOLN
INTRAMUSCULAR | Status: DC | PRN
  Administered 2017-06-26 (×2): 50 ug via INTRAVENOUS
  Administered 2017-06-26: 100 ug via INTRAVENOUS
  Administered 2017-06-26 (×8): 50 ug via INTRAVENOUS

## 2017-06-26 MED ORDER — EPHEDRINE 5 MG/ML INJ
INTRAVENOUS | Status: AC
  Filled 2017-06-26: qty 10

## 2017-06-26 MED ORDER — DEXAMETHASONE SODIUM PHOSPHATE 10 MG/ML IJ SOLN
INTRAMUSCULAR | Status: DC | PRN
  Administered 2017-06-26: 4 mg via INTRAVENOUS

## 2017-06-26 MED ORDER — 0.9 % SODIUM CHLORIDE (POUR BTL) OPTIME
TOPICAL | Status: DC | PRN
  Administered 2017-06-26 (×2): 1000 mL

## 2017-06-26 MED ORDER — PANTOPRAZOLE SODIUM 40 MG IV SOLR
40.0000 mg | Freq: Every day | INTRAVENOUS | Status: DC
  Administered 2017-06-26 – 2017-06-28 (×3): 40 mg via INTRAVENOUS
  Filled 2017-06-26 (×3): qty 40

## 2017-06-26 MED ORDER — SODIUM CHLORIDE 0.9% FLUSH: 9.0000 mL | INTRAVENOUS | Status: DC | PRN

## 2017-06-26 MED ORDER — ACETAMINOPHEN 325 MG PO TABS: 650.0000 mg | ORAL_TABLET | Freq: Four times a day (QID) | ORAL | Status: DC | PRN

## 2017-06-26 MED ORDER — LIDOCAINE 2% (20 MG/ML) 5 ML SYRINGE
INTRAMUSCULAR | Status: AC
  Filled 2017-06-26: qty 5

## 2017-06-26 MED ORDER — ACETAMINOPHEN 650 MG RE SUPP: 650.0000 mg | Freq: Four times a day (QID) | RECTAL | Status: DC | PRN

## 2017-06-26 MED ORDER — KETOROLAC TROMETHAMINE 30 MG/ML IJ SOLN
INTRAMUSCULAR | Status: AC
  Filled 2017-06-26: qty 1

## 2017-06-26 MED ORDER — HYDROMORPHONE HCL 1 MG/ML IJ SOLN
INTRAMUSCULAR | Status: AC
  Filled 2017-06-26: qty 1

## 2017-06-26 MED ORDER — SIMETHICONE 80 MG PO CHEW: 40.0000 mg | CHEWABLE_TABLET | Freq: Four times a day (QID) | ORAL | Status: DC | PRN

## 2017-06-26 MED ORDER — METHOCARBAMOL 500 MG PO TABS: 500.0000 mg | ORAL_TABLET | Freq: Four times a day (QID) | ORAL | Status: DC | PRN

## 2017-06-26 MED ORDER — BUPIVACAINE-EPINEPHRINE 0.5% -1:200000 IJ SOLN
INTRAMUSCULAR | Status: DC | PRN
  Administered 2017-06-26: 8 mL

## 2017-06-26 MED ORDER — ONDANSETRON HCL 4 MG/2ML IJ SOLN
INTRAMUSCULAR | Status: AC
  Filled 2017-06-26: qty 2

## 2017-06-26 SURGICAL SUPPLY — 75 items
APPLIER CLIP 5 13 M/L LIGAMAX5 (MISCELLANEOUS)
BLADE CLIPPER SURG (BLADE) ×2 IMPLANT
BLADE SURG 10 STRL SS (BLADE) ×2 IMPLANT
CANISTER SUCT 3000ML PPV (MISCELLANEOUS) ×2 IMPLANT
CATH FOLEY LATEX FREE 16FR (CATHETERS) ×1
CATH FOLEY LF 16FR (CATHETERS) ×1 IMPLANT
CELLS DAT CNTRL 66122 CELL SVR (MISCELLANEOUS) IMPLANT
CHLORAPREP W/TINT 26ML (MISCELLANEOUS) ×2 IMPLANT
CLIP APPLIE 5 13 M/L LIGAMAX5 (MISCELLANEOUS) IMPLANT
COVER MAYO STAND STRL (DRAPES) ×2 IMPLANT
COVER SURGICAL LIGHT HANDLE (MISCELLANEOUS) ×4 IMPLANT
DRAPE HALF SHEET 40X57 (DRAPES) ×2 IMPLANT
DRAPE UTILITY XL STRL (DRAPES) ×2 IMPLANT
DRSG OPSITE POSTOP 4X10 (GAUZE/BANDAGES/DRESSINGS) ×2 IMPLANT
DRSG OPSITE POSTOP 4X8 (GAUZE/BANDAGES/DRESSINGS) IMPLANT
DRSG TEGADERM 2-3/8X2-3/4 SM (GAUZE/BANDAGES/DRESSINGS) ×10 IMPLANT
ELECT BLADE 6.5 EXT (BLADE) ×2 IMPLANT
ELECT CAUTERY BLADE 6.4 (BLADE) ×4 IMPLANT
ELECT REM PT RETURN 9FT ADLT (ELECTROSURGICAL) ×2
ELECTRODE REM PT RTRN 9FT ADLT (ELECTROSURGICAL) ×1 IMPLANT
FILTER SMOKE EVAC LAPAROSHD (FILTER) ×2 IMPLANT
GAUZE SPONGE 2X2 8PLY STRL LF (GAUZE/BANDAGES/DRESSINGS) ×1 IMPLANT
GEL ULTRASOUND 20GR AQUASONIC (MISCELLANEOUS) IMPLANT
GLOVE BIOGEL PI IND STRL 6.5 (GLOVE) ×1 IMPLANT
GLOVE BIOGEL PI IND STRL 7.0 (GLOVE) ×3 IMPLANT
GLOVE BIOGEL PI IND STRL 7.5 (GLOVE) ×2 IMPLANT
GLOVE BIOGEL PI INDICATOR 6.5 (GLOVE) ×1
GLOVE BIOGEL PI INDICATOR 7.0 (GLOVE) ×3
GLOVE BIOGEL PI INDICATOR 7.5 (GLOVE) ×2
GLOVE SURG SS PI 6.0 STRL IVOR (GLOVE) ×2 IMPLANT
GLOVE SURG SS PI 7.0 STRL IVOR (GLOVE) ×10 IMPLANT
GLOVE SURG SS PI 7.5 STRL IVOR (GLOVE) ×2 IMPLANT
GOWN STRL REUS W/ TWL LRG LVL3 (GOWN DISPOSABLE) ×7 IMPLANT
GOWN STRL REUS W/TWL LRG LVL3 (GOWN DISPOSABLE) ×7
KIT BASIN OR (CUSTOM PROCEDURE TRAY) ×2 IMPLANT
KIT ROOM TURNOVER OR (KITS) ×2 IMPLANT
LIGASURE IMPACT 36 18CM CVD LR (INSTRUMENTS) ×2 IMPLANT
NS IRRIG 1000ML POUR BTL (IV SOLUTION) ×4 IMPLANT
PAD ARMBOARD 7.5X6 YLW CONV (MISCELLANEOUS) ×4 IMPLANT
PENCIL BUTTON HOLSTER BLD 10FT (ELECTRODE) ×4 IMPLANT
RELOAD LINEAR CUT PROX 55 BLUE (ENDOMECHANICALS) ×6 IMPLANT
RTRCTR WOUND ALEXIS 18CM MED (MISCELLANEOUS)
SCISSORS LAP 5X35 DISP (ENDOMECHANICALS) ×2 IMPLANT
SET IRRIG TUBING LAPAROSCOPIC (IRRIGATION / IRRIGATOR) ×2 IMPLANT
SHEARS HARMONIC ACE PLUS 36CM (ENDOMECHANICALS) ×2 IMPLANT
SLEEVE ENDOPATH XCEL 5M (ENDOMECHANICALS) ×2 IMPLANT
SPECIMEN JAR LARGE (MISCELLANEOUS) ×2 IMPLANT
SPONGE GAUZE 2X2 STER 10/PKG (GAUZE/BANDAGES/DRESSINGS) ×1
SPONGE LAP 18X18 X RAY DECT (DISPOSABLE) ×4 IMPLANT
STAPLER GUN LINEAR PROX 60 (STAPLE) ×2 IMPLANT
STAPLER PROXIMATE 55 BLUE (STAPLE) ×2 IMPLANT
STAPLER VISISTAT 35W (STAPLE) ×2 IMPLANT
SUCTION POOLE TIP (SUCTIONS) ×2 IMPLANT
SURGILUBE 2OZ TUBE FLIPTOP (MISCELLANEOUS) IMPLANT
SUT PDS AB 1 TP1 96 (SUTURE) ×8 IMPLANT
SUT PROLENE 2 0 CT2 30 (SUTURE) IMPLANT
SUT PROLENE 2 0 KS (SUTURE) IMPLANT
SUT SILK 2 0 SH CR/8 (SUTURE) ×2 IMPLANT
SUT SILK 2 0 TIES 10X30 (SUTURE) ×2 IMPLANT
SUT SILK 3 0 SH CR/8 (SUTURE) ×2 IMPLANT
SUT SILK 3 0 TIES 10X30 (SUTURE) ×2 IMPLANT
SYR BULB IRRIGATION 50ML (SYRINGE) ×4 IMPLANT
SYS LAPSCP GELPORT 120MM (MISCELLANEOUS) ×2
SYSTEM LAPSCP GELPORT 120MM (MISCELLANEOUS) ×1 IMPLANT
TOWEL OR 17X26 10 PK STRL BLUE (TOWEL DISPOSABLE) ×4 IMPLANT
TRAY LAPAROSCOPIC MC (CUSTOM PROCEDURE TRAY) ×2 IMPLANT
TRAY PROCTOSCOPIC FIBER OPTIC (SET/KITS/TRAYS/PACK) IMPLANT
TROCAR XCEL 12X100 BLDLESS (ENDOMECHANICALS) ×2 IMPLANT
TROCAR XCEL BLUNT TIP 100MML (ENDOMECHANICALS) IMPLANT
TROCAR XCEL NON-BLD 11X100MML (ENDOMECHANICALS) ×2 IMPLANT
TROCAR XCEL NON-BLD 5MMX100MML (ENDOMECHANICALS) ×2 IMPLANT
TUBE CONNECTING 12X1/4 (SUCTIONS) ×4 IMPLANT
TUBING INSUFFLATION (TUBING) ×2 IMPLANT
WATER STERILE IRR 1000ML POUR (IV SOLUTION) ×2 IMPLANT
YANKAUER SUCT BULB TIP NO VENT (SUCTIONS) ×4 IMPLANT

## 2017-06-26 NOTE — Progress Notes (Signed)
Patient arrived to 6N20 A&Ox4, VSS, IV intact and infusing.  Noted to have honeycomb midline dressing and 3 lap sites with gauze bandages.  Old drainage present and marked.  No family at bedside.  Patient oriented to room and equipment.  Will continue to monitor.

## 2017-06-26 NOTE — Progress Notes (Signed)
Patient complains of uncontrolled pain.  MD notified and PCA full dose dilaudid ordered.  Will continue to monitor.

## 2017-06-26 NOTE — Transfer of Care (Signed)
Immediate Anesthesia Transfer of Care Note  Patient: [name]  Procedure(s) Performed: LAPAROSCOPIC ASSISTED ILEOCECECTOMY (N/A Abdomen)  Patient Location: PACU  Anesthesia Type:General  Level of Consciousness: drowsy and patient cooperative  Airway & Oxygen Therapy: Patient Spontanous Breathing and Patient connected to face mask oxygen  Post-op Assessment: Report given to RN, Post -op Vital signs reviewed and stable and Patient moving all extremities  Post vital signs: stable  Last Vitals:  Vitals:   06/26/17 0639  BP: 132/84  Pulse: 76  Resp: 20  Temp: (!) 36.4 C  SpO2: 97%    Last Pain:  Vitals:   06/26/17 0652  TempSrc:   PainSc: 8       Patients Stated Pain Goal: 8 (93/55/21 7471)  Complications: No apparent anesthesia complications

## 2017-06-26 NOTE — Op Note (Signed)
Pre-op Diagnosis:  Large cecal diverticulum Post-op Diagnosis:  Cecal mass  Procedure performed: Laparoscopic assisted right hemicolectomy Surgeon: Maia Petties, MD Assistant: Dr. Fanny Skates Anesthesia: General endotracheal Indications: This is a 62 year old male who presents with a 2-year history of intermittent right lower quadrant abdominal pain.  He has undergone an extensive workup including colonoscopy and CT scan.  Colonoscopy showed a large stool-filled orifice in the cecum with some impingement on the ileocecal valve..  CT scan showed a large cecal diverticulum adjacent to the appendiceal orifice with no sign of malignancy or inflammation.  He presents now for surgical resection.  Description of procedure: The patient is brought to the operating room and placed in the supine position on the operating room table.  After an adequate level of general anesthesia was obtained, a Foley catheter was placed under sterile technique.  The patient's abdomen was prepped with ChloraPrep and draped in sterile fashion.  A timeout was taken to ensure the proper patient and proper procedure.  We made a vertical incision above his umbilicus.  Dissection was carried down the fascia.  We entered the peritoneal cavity bluntly.  A stay suture of 0 Vicryl was placed around the fascial opening.  The Hassan cannula was inserted and secured to stay suture.  Pneumoperitoneum was obtained as inflating CO2 maintaining a maximum pressure of 15 mmHg.  Additional 5 mm cannulas then placed in the left lower quadrant of the abdomen and the right upper quadrant under direct visualization. A careful evaluation of the entire abdomen was carried out. The patient was placed in Trendelenburg and left lateral decubitus position.  The scope was moved to the right upper quadrant port site. The cecum was mobilized medially.  There seems to be a firm intraluminal mass in the cecum near the ileocecal valve.  The terminal ileum is  densely adherent down into the right side of the pelvis.  We identified the base of the appendix but the tip of the appendix seems densely adherent posteriorly as well.  We mobilized the right colon medially and were able to slowly mobilize the appendix.  We divided the mesoappendix with the Harmonic scalpel.  We used a harmonic scalpel to help mobilize the terminal ileum.  We converted to a hand-assisted technique to help with mobilization of the cecum as it seemed to be fairly adherent to the lateral abdominal wall.  The hepatic flexure was also difficult to mobilize.  We had to extend our fascial incision superiorly to allow adequate mobilization.  We then exteriorized the cecum.  We divided the terminal ileum with a GIA-75 stapler.  LigaSure was used to take the mesentery of the terminal ileum.  The ileocecal vessels were ligated with 2-0 silk sutures.  We used the LigaSure to divide the mesentery of the ascending colon to the midportion of the descending colon.  We divided the ascending colon with a GIA-75 stapler.  We then created a side-to-side anastomosis with a GIA 75 and a TA 60 stapler.  A reinforcing suture of 3-0 silk was placed at the crotch of the anastomosis.  The staple line was oversewn with 3-0 silk sutures.  The anastomosis appeared to be patent.  There is no gross bleeding.  We irrigated the abdomen thoroughly and inspected for hemostasis.  Gowns and gloves were changed according to protocol.  The fascia was then closed with double-stranded #1 PDS suture.  We infiltrated Exparel into the fascia.  Staples were used to close all the skin incisions.  The patient was extubated and brought to recovery room in stable condition.  Instrument, sponge, and needle counts were correct at the conclusion of the case.   The terminal ileum and cecum were sent for pathologic examination.  Imogene Burn. Georgette Dover, MD, [facility] Surgery  General/ Trauma Surgery  06/26/2017 10:55 AM

## 2017-06-26 NOTE — H&P (Signed)
History of Present Illness  The patient is a 62 year old male who presents with abdominal pain. The patient is a 62 year old male who presents with an abdominal mass. Second opinion - PCP - Dr. Antony Blackbird    GI - Dr. Dolphus Jenny - [place]  GU - [place] - [place]  Gen Surgery - [place] - [place]    This is a 62 yo male with chronic low back pain who presented with gross hematuria in 2/18.He saw Dr. Lupita Leash who obtained a CT scan to rule out nephrolithiasis.The scan incidentally noted what appears to be a large cecal diverticulum adjacent to the appendiceal orifice. There was no sign of inflammation or obvious malignancy. He underwent colonoscopy in June 2018 which showed a large stool-filled orifice in the cecum with some impingement on the IC valve. The diverticulum appears to be separate from the appendiceal orifice. The patient reports only occasional minimal twinges of discomfort in the RLQ. He denies any melena or hematochezia. He was referred to surgery in [facility], but felt that surgery was pushed on him without any explanation or discussion. He came to [place] earlier this year for second opinion. I recommended laparoscopic-assisted ileocecectomy, but the patient did not schedule his surgery. We have obtained and reviewed all of his previous records and scans. Over the last several weeks, he has had episodes of increasing tenderness in the right lower quadrant. He wants to go ahead and schedule surgery now.    In 2016, he had a colonoscopy in [place], [place] which did not note any cecal diverticula.      Problem List/Past Medical  SOLITARY DIVERTICULUM OF RIGHT SIDE OF COLON (K57.30)    Past Surgical History  Anal Fissure Repair  Colon Polyp Removal - Colonoscopy  Knee Surgery Left.  Spinal Surgery - Lower Back  Spinal Surgery - Neck    Diagnostic Studies History   Colonoscopy within last year    Allergies  DULoxetine HCl *ANTIDEPRESSANTS Nausea.  Flexeril *MUSCULOSKELETAL  THERAPY AGENTS* Hives.  Latex Rash.  Adhesive Tape Rash.  Allergies Reconciled    Medication History   Omeprazole (40MG  Capsule DR, Oral) Active.   Suboxone (8-2MG  Film, Sublingual) Active.  Myrbetriq (25MG  Tablet ER 24HR, Oral) Active.  Finasteride (5MG  Tablet, Oral as needed) Active.  Meloxicam (15MG  Tablet, Oral) Active.  Aspirin (325MG  Capsule, Oral) Active.  Medications Reconciled  Voltaren (1% Gel, Transdermal) Active.    Social History  Alcohol use Remotely quit alcohol use.  Caffeine use Carbonated beverages, Coffee, Tea.  Illicit drug use Remotely quit drug use.  Tobacco use Former smoker.    Family History  Family history unknown  First Degree Relatives    Other Problems Arthritis  Back Pain Bladder Problems  Enlarged Prostate  Gastroesophageal Reflux Disease  Hemorrhoids Hepatitis Sleep Apnea  Transfusion history        Vitals Weight: 239.2 lb Height: 68in  Body Surface Area: 2.21 m Body Mass Index: 36.37 kg/m   Temp.: 98.58F(Oral)  Pulse: 82 (Regular)   BP: 126/78 (Sitting, Left Arm, Standard)            Physical Exam     The physical exam findings are as follows:  Note:WDWN in NAD  Eyes: Pupils equal, round; sclera anicteric  HENT: Oral mucosa moist; good dentition  Neck: No masses palpated, no thyromegaly  Lungs: CTA bilaterally; normal respiratory effort  CV: Regular rate and rhythm; no murmurs; extremities well-perfused with no edema  Abd: +bowel sounds,  soft, minimal RLQ tenderness, no palpable organomegaly; no palpable hernias  Skin: Warm, dry; no sign of jaundice  Psychiatric - alert and oriented x 4; calm mood and affect        Assessment & Plan    SOLITARY DIVERTICULUM OF RIGHT SIDE OF COLON (K57.30)    Current Plans  Schedule for Surgery - Laparoscopic-assisted ileocecectomy. The surgical procedure has been discussed with the patient. Potential risks, benefits, alternative treatments,  and expected outcomes have been explained. All of the patient's questions at this time have been answered. The likelihood of reaching the patient's treatment goal is good. The patient understand the proposed surgical procedure and wishes to proceed.  Note:This is an unusual finding of a large cecal diverticulum filled with stool. I explained to him that it would be difficult to completely rule out malignancy in this area, but the CT scan did not show any suspicious findings. I did recommend laparoscopic-assisted ileocecectomy with primary anastomosis.   Imogene Burn. Georgette Dover, MD, [facility] Surgery  General/ Trauma Surgery  06/26/2017 7:13 AM

## 2017-06-26 NOTE — Anesthesia Procedure Notes (Signed)
Procedure Name: Intubation Date/Time: 06/26/2017 7:54 AM Performed by: Renato Shin, CRNA Pre-anesthesia Checklist: Patient identified, Emergency Drugs available, Suction available, Patient being monitored and Timeout performed Patient Re-evaluated:Patient Re-evaluated prior to induction Oxygen Delivery Method: Circle system utilized Preoxygenation: Pre-oxygenation with 100% oxygen Induction Type: IV induction Ventilation: Mask ventilation without difficulty Laryngoscope Size: Miller and 3 Grade View: Grade II Tube type: Oral Tube size: 7.5 mm Number of attempts: 1 Placement Confirmation: ETT inserted through vocal cords under direct vision,  positive ETCO2,  CO2 detector and breath sounds checked- equal and bilateral Secured at: 24 cm Tube secured with: Tape Dental Injury: Teeth and Oropharynx as per pre-operative assessment

## 2017-06-26 NOTE — Anesthesia Preprocedure Evaluation (Signed)
Anesthesia Evaluation  Patient identified by MRN, date of birth, ID band Patient awake    Reviewed: Allergy & Precautions, NPO status , Patient's Chart, lab work & pertinent test results  Airway Mallampati: II  TM Distance: >3 FB Neck ROM: Full    Dental no notable dental hx.    Pulmonary sleep apnea , former smoker,    Pulmonary exam normal breath sounds clear to auscultation       Cardiovascular negative cardio ROS Normal cardiovascular exam Rhythm:Regular Rate:Normal     Neuro/Psych negative neurological ROS  negative psych ROS   GI/Hepatic negative GI ROS, (+) Hepatitis -, C  Endo/Other  diabetes  Renal/GU negative Renal ROS  negative genitourinary   Musculoskeletal negative musculoskeletal ROS (+)   Abdominal   Peds negative pediatric ROS (+)  Hematology negative hematology ROS (+)   Anesthesia Other Findings   Reproductive/Obstetrics negative OB ROS                             Anesthesia Physical Anesthesia Plan  ASA: III  Anesthesia Plan: General   Post-op Pain Management:    Induction: Intravenous  PONV Risk Score and Plan: 2 and Ondansetron, Dexamethasone and Treatment may vary due to age or medical condition  Airway Management Planned: Oral ETT  Additional Equipment:   Intra-op Plan:   Post-operative Plan: Extubation in OR  Informed Consent: I have reviewed the patients History and Physical, chart, labs and discussed the procedure including the risks, benefits and alternatives for the proposed anesthesia with the patient or authorized representative who has indicated his/her understanding and acceptance.   Dental advisory given  Plan Discussed with: CRNA and Surgeon  Anesthesia Plan Comments:         Anesthesia Quick Evaluation

## 2017-06-26 NOTE — Anesthesia Postprocedure Evaluation (Signed)
Anesthesia Post Note  Patient: Donald [name]  Procedure(s) Performed: LAPAROSCOPIC ASSISTED ILEOCECECTOMY (N/A Abdomen)     Patient location during evaluation: PACU Anesthesia Type: General Level of consciousness: awake and alert Pain management: pain level controlled Vital Signs Assessment: post-procedure vital signs reviewed and stable Respiratory status: spontaneous breathing, nonlabored ventilation, respiratory function stable and patient connected to nasal cannula oxygen Cardiovascular status: blood pressure returned to baseline and stable Postop Assessment: no apparent nausea or vomiting Anesthetic complications: no    Last Vitals:  Vitals:   06/26/17 1330 06/26/17 1345  BP:  (!) 150/90  Pulse: 71 71  Resp: 13 14  Temp:    SpO2: 98% 98%    Last Pain:  Vitals:   06/26/17 1345  TempSrc:   PainSc: Asleep                 Aulton Routt S

## 2017-06-27 ENCOUNTER — Encounter (HOSPITAL_COMMUNITY): Payer: Self-pay | Admitting: Surgery

## 2017-06-27 LAB — CBC
HEMATOCRIT: 37.3 % — AB (ref 39.0–52.0)
HEMOGLOBIN: 12.3 g/dL — AB (ref 13.0–17.0)
MCH: 28.9 pg (ref 26.0–34.0)
MCHC: 33 g/dL (ref 30.0–36.0)
MCV: 87.6 fL (ref 78.0–100.0)
Platelets: 188 10*3/uL (ref 150–400)
RBC: 4.26 MIL/uL (ref 4.22–5.81)
RDW: 12.3 % (ref 11.5–15.5)
WBC: 8.3 10*3/uL (ref 4.0–10.5)

## 2017-06-27 LAB — BASIC METABOLIC PANEL
Anion gap: 9 (ref 5–15)
BUN: 9 mg/dL (ref 6–20)
CHLORIDE: 105 mmol/L (ref 101–111)
CO2: 25 mmol/L (ref 22–32)
CREATININE: 1.03 mg/dL (ref 0.61–1.24)
Calcium: 8.2 mg/dL — ABNORMAL LOW (ref 8.9–10.3)
GFR calc non Af Amer: >60 mL/min (ref >60)
GLUCOSE: 122 mg/dL — AB (ref 65–99)
Potassium: 3.9 mmol/L (ref 3.5–5.1)
Sodium: 139 mmol/L (ref 135–145)

## 2017-06-27 LAB — GLUCOSE, CAPILLARY: Glucose-Capillary: 103 mg/dL — ABNORMAL HIGH (ref 65–99)

## 2017-06-27 MED ORDER — METHOCARBAMOL 1000 MG/10ML IJ SOLN
500.0000 mg | Freq: Four times a day (QID) | INTRAVENOUS | Status: DC | PRN
  Administered 2017-06-28: 500 mg via INTRAVENOUS
  Filled 2017-06-27: qty 5

## 2017-06-27 MED ORDER — PHENOL 1.4 % MT LIQD
1.0000 | OROMUCOSAL | Status: DC | PRN
  Filled 2017-06-27: qty 177

## 2017-06-27 MED ORDER — ACETAMINOPHEN 10 MG/ML IV SOLN
1000.0000 mg | Freq: Four times a day (QID) | INTRAVENOUS | Status: AC
  Administered 2017-06-27 – 2017-06-28 (×4): 1000 mg via INTRAVENOUS
  Filled 2017-06-27 (×4): qty 100

## 2017-06-27 NOTE — Progress Notes (Signed)
1 Day Post-Op   Subjective/Chief Complaint: Patient having issues with pain control - changed to full-dose Dilaudid PCA, along with Toradol Labs not drawn yet Foley still in place Able to sleep some last night No flatus yet, but feeling "rumbling" in stomach   Objective: Vital signs in last 24 hours: Temp:  [97.6 F (36.4 C)-99.1 F (37.3 C)] 98.4 F (36.9 C) (01/23 5859) Pulse Rate:  [70-84] 80 (01/23 0638) Resp:  [10-21] 16 (01/23 0800) BP: (142-156)/(79-95) 151/87 (01/23 0638) SpO2:  [95 %-100 %] 98 % (01/23 0800) Weight:  [112.8 kg (248 lb 10.9 oz)] 112.8 kg (248 lb 10.9 oz) (01/22 1540) Last BM Date: (PTA)  Intake/Output from previous day: 01/22 0701 - 01/23 0700 In: 1974.8 [I.V.:1974.8] Out: 3250 [Urine:2650; Emesis/NG output:500; Blood:100] Intake/Output this shift: No intake/output data recorded.  General appearance: alert, cooperative and no distress Resp: clear to auscultation bilaterally Cardio: regular rate and rhythm, S1, S2 normal, no murmur, click, rub or gallop GI: minimal distention, + BS, midline tenderness; Dressings with minimal drainage  Lab Results:  No results for input(s): WBC, HGB, HCT, PLT in the last 72 hours. BMET No results for input(s): NA, K, CL, CO2, GLUCOSE, BUN, CREATININE, CALCIUM in the last 72 hours. PT/INR No results for input(s): LABPROT, INR in the last 72 hours. ABG No results for input(s): PHART, HCO3 in the last 72 hours.  Invalid input(s): PCO2, PO2  Studies/Results: No results found.  Anti-infectives: Anti-infectives (From admission, onward)   Start     Dose/Rate Route Frequency Ordered Stop   06/26/17 2000  cefoTEtan (CEFOTAN) 2 g in dextrose 5 % 50 mL IVPB     2 g 100 mL/hr over 30 Minutes Intravenous Every 12 hours 06/26/17 1555 06/26/17 2311   06/26/17 0614  cefoTEtan in Dextrose 5% (CEFOTAN) IVPB 2 g     2 g Intravenous On call to O.R. 06/26/17 0614 06/26/17 0759      Assessment/Plan: Large cecal mass -  probable cecal diverticulum S/p lap-assisted right hemicolectomy 06/26/17 Pain control - continue PCA, Toradol q6.  Change to scheduled IV Tylenol q6 x 24 hours.  IV Robaxin q6 PRN Awaiting bowel function - NG to LIWS; ice chips Sore throat - chloraseptic spray  Encourage ambulation Abdominal binder  LOS: 1 day    Donald [name] 06/27/2017

## 2017-06-28 ENCOUNTER — Ambulatory Visit: Payer: Medicare HMO | Admitting: Neurology

## 2017-06-28 MED ORDER — OXYCODONE HCL 5 MG PO TABS
10.0000 mg | ORAL_TABLET | Freq: Four times a day (QID) | ORAL | Status: DC | PRN
  Administered 2017-06-28 – 2017-06-30 (×7): 10 mg via ORAL
  Filled 2017-06-28 (×7): qty 2

## 2017-06-28 MED ORDER — ACETAMINOPHEN 500 MG PO TABS
1000.0000 mg | ORAL_TABLET | Freq: Four times a day (QID) | ORAL | Status: DC
  Administered 2017-06-28 – 2017-06-30 (×11): 1000 mg via ORAL
  Filled 2017-06-28 (×14): qty 2

## 2017-06-28 MED ORDER — METOCLOPRAMIDE HCL 5 MG/ML IJ SOLN
10.0000 mg | Freq: Four times a day (QID) | INTRAMUSCULAR | Status: DC
  Administered 2017-06-28 – 2017-06-29 (×5): 10 mg via INTRAVENOUS
  Filled 2017-06-28 (×5): qty 2

## 2017-06-28 MED ORDER — HYDROMORPHONE HCL 1 MG/ML IJ SOLN
1.0000 mg | INTRAMUSCULAR | Status: DC | PRN
  Administered 2017-06-29 (×4): 1 mg via INTRAVENOUS
  Filled 2017-06-28 (×4): qty 1

## 2017-06-28 NOTE — Progress Notes (Signed)
2 Days Post-Op   Subjective/Chief Complaint: Much more comfortable today Small flatus, lots of "rumbling" Has ambulated in halls Voiding well TOlerating clear liquids Abd binder in place  Path - no sign of malignancy - large cecal diverticulum filled with stool.   Objective: Vital signs in last 24 hours: Temp:  [97.8 F (36.6 C)-98.4 F (36.9 C)] 97.8 F (36.6 C) (01/24 0609) Pulse Rate:  [62-77] 63 (01/24 0609) Resp:  [12-16] 13 (01/24 0609) BP: (119-150)/(62-86) 119/78 (01/24 0609) SpO2:  [94 %-100 %] 94 % (01/24 0609) Last BM Date: (PTA)  Intake/Output from previous day: 01/23 0701 - 01/24 0700 In: 4285.8 [P.O.:180; I.V.:3705.8; IV Piggyback:400] Out: 2370 [Urine:2250; Emesis/NG output:120] Intake/Output this shift: No intake/output data recorded.  General appearance: alert, cooperative and no distress Resp: clear to auscultation bilaterally GI: soft, non-distended; audible bowel sounds; incisional tenderness Dressings c/d/i  Lab Results:  Recent Labs    06/27/17 0902  WBC 8.3  HGB 12.3*  HCT 37.3*  PLT 188   BMET Recent Labs    06/27/17 0902  NA 139  K 3.9  CL 105  CO2 25  GLUCOSE 122*  BUN 9  CREATININE 1.03  CALCIUM 8.2*   PT/INR No results for input(s): LABPROT, INR in the last 72 hours. ABG No results for input(s): PHART, HCO3 in the last 72 hours.  Invalid input(s): PCO2, PO2  Studies/Results: No results found.  Anti-infectives: Anti-infectives (From admission, onward)   Start     Dose/Rate Route Frequency Ordered Stop   06/26/17 2000  cefoTEtan (CEFOTAN) 2 g in dextrose 5 % 50 mL IVPB     2 g 100 mL/hr over 30 Minutes Intravenous Every 12 hours 06/26/17 1555 06/26/17 2311   06/26/17 0614  cefoTEtan in Dextrose 5% (CEFOTAN) IVPB 2 g     2 g Intravenous On call to O.R. 06/26/17 0614 06/26/17 0759      Assessment/Plan: Large cecal mass - probable cecal diverticulum S/p lap-assisted right hemicolectomy 06/26/17 Pain control - d/c  PCA, PO Oxycodone.  Change to scheduled PO Tylenol q6 x 24 hours.  IV Robaxin q6 PRN Clear liquids Sore throat - chloraseptic spray  Encourage ambulation   LOS: 2 days    Donald [name] 06/28/2017

## 2017-06-28 NOTE — Consult Note (Signed)
            [facility] CM Primary Care Navigator  06/28/2017  Donald [name] [birth year] [id number]   Seenpatient at the bedside toidentify possible discharge needs.  Patientstates that he had been having "persistent abdominal pain"- abdominal mass thatresultedto this admission/ surgery. (Laparoscopic Assisted Ileocecectomy)  PatientendorsesDr. [name] with [facility] [place] care provider.   [facility] [address] [address] [facility].   Hereportsmanaginghis ownmedications at home straight out of the containers.  Patient states that he has been driving prior to admission. His friend Lanny Hurst C.) will be able toprovidetransportation tohisdoctors'appointments if needed after discharge.  Patient is aware of [employer] transportation benefit when needed.  Patientlives alone andindependent with self care prior to admission/ surgery. He states that friends can assist with needs after discharge.  Anticipated plan for discharge ishome per patient and RN.Await physician's order for discharge.  Patientvoiced understanding to call primary care provider's office whenhereturnshomefor a post discharge follow-upvisitwithin1-2 weeksor sooner if needs arise.Patient letter (with PCP's contact number) was provided as his reminder.   Discussed with patientregarding [facility] CM services available for health management and resources at homebuthe denies any current needs or concerns at this point.  Patient denies having DM and states that he is not on any medications for it.  Patient would only opt and verbally agree for EMMI calls to monitor his recovery. Referral made for EMMI General calls to follow-up with recovery after discharge.  Patientexpressed understandingof needto seekreferral from primary care provider to [facility] care management ifdeemed necessary and  appropriatefor anyservices in the future.   [facility] care management information was provided for future needs that hemay have.   For additional questions please contact:  Edwena Felty A. Carle Dargan, BSN, RN-BC [facility] PRIMARY CARE Navigator Cell: [phone]

## 2017-06-29 MED ORDER — METHOCARBAMOL 500 MG PO TABS
500.0000 mg | ORAL_TABLET | Freq: Three times a day (TID) | ORAL | Status: DC | PRN
Start: 2017-06-29 — End: 2017-06-30
  Administered 2017-06-29 – 2017-06-30 (×3): 500 mg via ORAL
  Filled 2017-06-29 (×3): qty 1

## 2017-06-29 MED ORDER — OXYCODONE HCL 10 MG PO TABS: 10.0000 mg | ORAL_TABLET | Freq: Four times a day (QID) | ORAL | 0 refills | Status: DC | PRN

## 2017-06-29 MED ORDER — PANTOPRAZOLE SODIUM 20 MG PO TBEC
20.0000 mg | DELAYED_RELEASE_TABLET | Freq: Every day | ORAL | Status: DC
  Administered 2017-06-29 – 2017-07-01 (×3): 20 mg via ORAL
  Filled 2017-06-29 (×5): qty 1

## 2017-06-29 NOTE — Discharge Instructions (Signed)
[facility]      [facility] [phone]  OPEN ABDOMINAL SURGERY: POST OP INSTRUCTIONS  Always review your discharge instruction sheet given to you by the facility where your surgery was performed.  IF YOU HAVE DISABILITY OR FAMILY LEAVE FORMS, YOU MUST BRING THEM TO THE OFFICE FOR PROCESSING.  PLEASE DO NOT GIVE THEM TO YOUR DOCTOR.  1. A prescription for pain medication may be given to you upon discharge.  Take your pain medication as prescribed, if needed.  If narcotic pain medicine is not needed, then you may take acetaminophen (Tylenol) or ibuprofen (Advil) as needed. 2. Take your usually prescribed medications unless otherwise directed. 3. If you need a refill on your pain medication, please contact your pharmacy. They will contact our office to request authorization.  Prescriptions will not be filled after 5pm or on week-ends. 4. You should follow a light diet the first few days after arrival home, such as soup and crackers, pudding, etc.unless your doctor has advised otherwise. A high-fiber, low fat diet can be resumed as tolerated.   Be sure to include lots of fluids daily. Most patients will experience some swelling and bruising on the chest and neck area.  Ice packs will help.  Swelling and bruising can take several days to resolve 5. Most patients will experience some swelling and bruising in the area of the incision. Ice pack will help. Swelling and bruising can take several days to resolve..  6. It is common to experience some constipation if taking pain medication after surgery.  Increasing fluid intake and taking a stool softener will usually help or prevent this problem from occurring.  A mild laxative (Milk of Magnesia or Miralax) should be taken according to package directions if there are no bowel movements after 48 hours. 7.  You may have steri-strips (small skin tapes) in place directly over the incision.  These strips should be left on the skin for 7-10 days.  If your  surgeon used skin glue on the incision, you may shower in 24 hours.  The glue will flake off over the next 2-3 weeks.  Any sutures or staples will be removed at the office during your follow-up visit. You may find that a light gauze bandage over your incision may keep your staples from being rubbed or pulled. You may shower and replace the bandage daily. 8. ACTIVITIES:  You may resume regular (light) daily activities beginning the next day--such as daily self-care, walking, climbing stairs--gradually increasing activities as tolerated.  You may have sexual intercourse when it is comfortable.  Refrain from any heavy lifting or straining until approved by your doctor. a. You may drive when you no longer are taking prescription pain medication, you can comfortably wear a seatbelt, and you can safely maneuver your car and apply brakes b. Return to Work: ___________________________________ 9. You should see your doctor in the office for a follow-up appointment approximately two weeks after your surgery.  Make sure that you call for this appointment within a day or two after you arrive home to insure a convenient appointment time. OTHER INSTRUCTIONS:  _____________________________________________________________ _____________________________________________________________  WHEN TO CALL YOUR DOCTOR: 1. Fever over 101.0 2. Inability to urinate 3. Nausea and/or vomiting 4. Extreme swelling or bruising 5. Continued bleeding from incision. 6. Increased pain, redness, or drainage from the incision. 7. Difficulty swallowing or breathing 8. Muscle cramping or spasms. 9. Numbness or tingling in hands or feet or around lips.  The clinic staff is available to   answer your questions during regular business hours.  Please don't hesitate to call and ask to speak to one of the nurses if you have concerns.  For further questions, please visit [email or web address]   

## 2017-06-29 NOTE — Care Management Important Message (Signed)
Important Message  Patient Details  Name: Donald [name] MRN: [id number] Date of Birth: March 07, 1956   Medicare Important Message Given:  Yes    Aladdin Kollmann P Lennyx Verdell 06/29/2017, 2:45 PM

## 2017-06-29 NOTE — Progress Notes (Signed)
3 Days Post-Op   Subjective/Chief Complaint: Had a loose Bm yesterday afternoon Has not required IV pain meds - using Oxy IR No nausea - tolerating clear liquids Ambulating in halls Voiding frequently   Objective: Vital signs in last 24 hours: Temp:  [97.6 F (36.4 C)-98.1 F (36.7 C)] 97.6 F (36.4 C) (01/25 0518) Pulse Rate:  [60-64] 60 (01/25 0518) Resp:  [20] 20 (01/25 0518) BP: (123-130)/(54-77) 126/64 (01/25 0518) SpO2:  [95 %-98 %] 96 % (01/25 0518) Last BM Date: (PTA)  Intake/Output from previous day: 01/24 0701 - 01/25 0700 In: 3554.4 [P.O.:614; I.V.:2885.4; IV Piggyback:55] Out: 2956 [OZHYQ:6578] Intake/Output this shift: No intake/output data recorded.  General appearance: alert, cooperative and no distress Resp: clear to auscultation bilaterally Cardio: regular rate and rhythm, S1, S2 normal, no murmur, click, rub or gallop GI: soft, incisional tenderness, + BS Port sites and midline incision c/d/i  Lab Results:  Recent Labs    06/27/17 0902  WBC 8.3  HGB 12.3*  HCT 37.3*  PLT 188   BMET Recent Labs    06/27/17 0902  NA 139  K 3.9  CL 105  CO2 25  GLUCOSE 122*  BUN 9  CREATININE 1.03  CALCIUM 8.2*   PT/INR No results for input(s): LABPROT, INR in the last 72 hours. ABG No results for input(s): PHART, HCO3 in the last 72 hours.  Invalid input(s): PCO2, PO2  Studies/Results: No results found.  Anti-infectives: Anti-infectives (From admission, onward)   Start     Dose/Rate Route Frequency Ordered Stop   06/26/17 2000  cefoTEtan (CEFOTAN) 2 g in dextrose 5 % 50 mL IVPB     2 g 100 mL/hr over 30 Minutes Intravenous Every 12 hours 06/26/17 1555 06/26/17 2311   06/26/17 0614  cefoTEtan in Dextrose 5% (CEFOTAN) IVPB 2 g     2 g Intravenous On call to O.R. 06/26/17 0614 06/26/17 0759      Assessment/Plan: Large cecal mass - probable cecal diverticulum S/p lap-assisted right hemicolectomy 06/26/17 Pain control - PO Oxycodone. Toradol.   Change to scheduled PO Tylenol q6 x 24 hours. PO Robaxin q6 PRN Advance diet as tolerated  Encourage ambulation Possible discharge over the weekend.    LOS: 3 days    Donald [name] 06/29/2017

## 2017-06-30 MED ORDER — PSYLLIUM 95 % PO PACK
1.0000 | PACK | Freq: Two times a day (BID) | ORAL | Status: DC
  Administered 2017-06-30: 1 via ORAL
  Filled 2017-06-30 (×3): qty 1

## 2017-06-30 MED ORDER — HYDROMORPHONE HCL 1 MG/ML IJ SOLN: 1.0000 mg | INTRAMUSCULAR | Status: DC | PRN

## 2017-06-30 MED ORDER — OXYCODONE HCL 5 MG PO TABS
10.0000 mg | ORAL_TABLET | Freq: Four times a day (QID) | ORAL | Status: DC | PRN
  Administered 2017-06-30 – 2017-07-01 (×3): 10 mg via ORAL
  Filled 2017-06-30 (×3): qty 2

## 2017-06-30 MED ORDER — METHOCARBAMOL 750 MG PO TABS
750.0000 mg | ORAL_TABLET | Freq: Three times a day (TID) | ORAL | Status: DC
  Administered 2017-06-30 (×3): 750 mg via ORAL
  Filled 2017-06-30 (×3): qty 1

## 2017-06-30 MED ORDER — OXYCODONE HCL 5 MG PO TABS: 10.0000 mg | ORAL_TABLET | ORAL | Status: DC | PRN

## 2017-06-30 MED ORDER — IBUPROFEN 600 MG PO TABS: 600.0000 mg | ORAL_TABLET | Freq: Four times a day (QID) | ORAL | Status: DC | PRN

## 2017-06-30 MED ORDER — IBUPROFEN 600 MG PO TABS
600.0000 mg | ORAL_TABLET | Freq: Four times a day (QID) | ORAL | Status: DC
  Administered 2017-06-30 – 2017-07-01 (×5): 600 mg via ORAL
  Filled 2017-06-30 (×5): qty 1

## 2017-06-30 MED ORDER — HYDROMORPHONE HCL 1 MG/ML IJ SOLN
1.0000 mg | INTRAMUSCULAR | Status: DC | PRN
  Administered 2017-07-01: 1 mg via INTRAVENOUS
  Filled 2017-06-30: qty 1

## 2017-06-30 NOTE — Progress Notes (Signed)
4 Days Post-Op    CC: Cecal mass  Subjective: Patient is up in the chair eating.  He reports a good bowel movement yesterday but not such a good one yesterday; he describes his stool as pebbles yesterday.  I took the waffle dressing down his incision looks good.  He is living alone.  He says his children are angry with him and does not appear to have much support at home.  His only ambulated a few times room and outside the room x1. Pain control currently is Tylenol 1 g every 6 He had Dilaudid 1 mg IV x4 yesterday Toradol 30 mg IV x4 yesterday Robaxin 500 mg twice daily yesterday Reglan x2 yesterday OxyIR 10 mg x2 yesterday   Objective: Vital signs in last 24 hours: Temp:  [98.2 F (36.8 C)] 98.2 F (36.8 C) (01/26 0600) Pulse Rate:  [80-82] 82 (01/26 0600) BP: (124-137)/(69-84) 137/84 (01/26 0600) SpO2:  [96 %-97 %] 96 % (01/26 0600) Last BM Date: 06/29/17 P.o. intake for 80 recorded. Urine 650 recorded Stool x1 recorded Afebrile vital signs are stable Labs - none Colon, segmental resection, Ileum - COLON WITH DIVERTICULUM - BENIGN APPENDIX - ONE REACTIVE LYMPH NODE (0/1) - MARGINS VIABLE - NO MALIGNANCY IDENTIFIED Intake/Output from previous day: 01/25 0701 - 01/26 0700 In: 480 [P.O.:480] Out: 650 [Urine:650] Intake/Output this shift: No intake/output data recorded.  General appearance: alert, cooperative and no distress Resp: clear to auscultation bilaterally GI: Positive bowel sounds incision looks good dressings removed.  Lab Results:  No results for input(s): WBC, HGB, HCT, PLT in the last 72 hours.  BMET No results for input(s): NA, K, CL, CO2, GLUCOSE, BUN, CREATININE, CALCIUM in the last 72 hours. PT/INR No results for input(s): LABPROT, INR in the last 72 hours.  No results for input(s): AST, ALT, ALKPHOS, BILITOT, PROT, ALBUMIN in the last 168 hours.   Lipase  No results found for: LIPASE   Medications: . acetaminophen  1,000 mg Oral Q6H  .  enoxaparin (LOVENOX) injection  40 mg Subcutaneous Q24H  . ketorolac  30 mg Intravenous Q6H  . mirabegron ER  25 mg Oral Daily  . pantoprazole  20 mg Oral Daily     Assessment/Plan Cecal mass S/P laparoscopic assisted right hemicolectomy, 06/26/17 Dr. Donnie Mesa  History of anal fissure repair History of tobacco use Chronic Suboxone use  FEN: Regular diet ID: Preop DVT: Lovenox Foley: None Follow-up Dr. Georgette Dover  Plan: I am going to decrease his Dilaudid and increase the Robaxin. Switch him over to oral ibuprofen, and recheck labs in a.m.  I told him to get up and ambulate a good deal.  Put him on Metamucil twice daily.  We will look things over tomorrow and see how he is doing and aim for discharge in the next 24 hours.      LOS: 4 days    Anisia Leija 06/30/2017 [phone]

## 2017-07-01 MED ORDER — ORPHENADRINE CITRATE ER 100 MG PO TB12: 100.0000 mg | ORAL_TABLET | Freq: Two times a day (BID) | ORAL | Status: DC | PRN

## 2017-07-01 MED ORDER — CYCLOBENZAPRINE HCL 5 MG PO TABS: 5.0000 mg | ORAL_TABLET | Freq: Three times a day (TID) | ORAL | 0 refills | Status: DC | PRN

## 2017-07-01 MED ORDER — CYCLOBENZAPRINE HCL 5 MG PO TABS: 5.0000 mg | ORAL_TABLET | Freq: Three times a day (TID) | ORAL | Status: DC | PRN

## 2017-07-01 NOTE — Progress Notes (Addendum)
2224 Patient discharged to home. Verbalizes understanding of all discharge instructions including incision care, discharge medications, and follow up MD visits.   1124 Patient was delivered 3 in 1 and transported home by friend.

## 2017-07-01 NOTE — Discharge Summary (Signed)
Physician Discharge Summary  Patient ID: Donald [name] MRN: [id number] DOB/AGE: [birth year] 62 y.o.  Admit date: 06/26/2017 Discharge date: 07/01/2017  Admission Diagnoses:  Cecal mass/ probable cecal diverticulum  Discharge Diagnoses: Cecal diverticulum Active Problems:   Mass of cecum   Discharged Condition: good  Hospital Course: Laparoscopic-assisted right hemicolectomy on 06/26/17 for cecal mass.  Pathology confirmed this to be a large cecal diverticulum filled with stool.  No malignancy.  Post-operative course - difficult pain control due to prior chronic narcotic use.  Pain managed with PCA initially, converted to PO Oxycodone 10 mg along with Tylenol, Toradol, muscle relaxant.  Began having bowel movements and diet advanced.  Ready for discharge today.  Consults: None    Treatments: surgery: right hemicolectomy   Discharge Exam: Blood pressure (!) 155/75, pulse 72, temperature 98.3 F (36.8 C), temperature source Oral, resp. rate 14, weight 112.8 kg (248 lb 10.9 oz), SpO2 97 %. General appearance: alert, cooperative and no distress Resp: clear to auscultation bilaterally Cardio: regular rate and rhythm, S1, S2 normal, no murmur, click, rub or gallop GI: soft, minimal incisional tenderness Incisions c/d/i  Disposition: 01-Home or Self Care  Discharge Instructions    Call MD for:  persistant nausea and vomiting   Complete by:  As directed    Call MD for:  redness, tenderness, or signs of infection (pain, swelling, redness, odor or green/yellow discharge around incision site)   Complete by:  As directed    Call MD for:  severe uncontrolled pain   Complete by:  As directed    Call MD for:  temperature >100.4   Complete by:  As directed    Diet general   Complete by:  As directed    Driving Restrictions   Complete by:  As directed    Do not drive while taking pain medications   Increase activity slowly   Complete by:  As directed    May shower / Bathe   Complete  by:  As directed      Allergies as of 07/01/2017      Reactions   Duloxetine Nausea Only   Adhesive [tape] Rash   Latex Rash      Medication List    STOP taking these medications   fluticasone 50 MCG/ACT nasal spray Commonly known as:  FLONASE     TAKE these medications   ADULT GUMMY PO Take 2 each by mouth daily.   aspirin 81 MG tablet Take 81 mg by mouth daily.   cyclobenzaprine 5 MG tablet Commonly known as:  FLEXERIL Take 1 tablet (5 mg total) by mouth 3 (three) times daily as needed for muscle spasms.   diclofenac sodium 1 % Gel Commonly known as:  VOLTAREN Apply 2 g topically every 6 (six) hours as needed (MUSCLE PAIN).   multivitamin tablet Take by mouth.   MYRBETRIQ 25 MG Tb24 tablet Generic drug:  mirabegron ER TAKE 1 TABLET BY MOTH DAILY   omeprazole 40 MG capsule Commonly known as:  PRILOSEC TAKE 1 TABLET BY MOUTH DAILY   Oxycodone HCl 10 MG Tabs Take 1 tablet (10 mg total) by mouth every 6 (six) hours as needed for moderate pain.   RA VITAMIN B-12 TR 1000 MCG Tbcr Generic drug:  Cyanocobalamin Take 1,000 mcg by mouth daily. 2 GUMMIES   SUBOXONE 8-2 mg Subl SL tablet Generic drug:  buprenorphine-naloxone Place 1 tablet under the tongue daily.      Follow-up Information    Donnie Mesa, MD  Follow up on 07/05/2017.   Specialty:  General Surgery Why:  2:30 PM for staple removal Contact information: [address] [place] [address] [place] [address] [phone]           Signed: Maia Petties 07/01/2017, 8:55 AM

## 2017-07-01 NOTE — Care Management (Signed)
Contacted by bedside RN.  Pt requests 3in1.  DME order placed and ordered from [facility] for delivery to room.

## 2017-07-02 DIAGNOSIS — G4733 Obstructive sleep apnea (adult) (pediatric): Secondary | ICD-10-CM | POA: Diagnosis not present

## 2017-07-11 DIAGNOSIS — M545 Low back pain: Secondary | ICD-10-CM | POA: Diagnosis not present

## 2017-07-11 DIAGNOSIS — G894 Chronic pain syndrome: Secondary | ICD-10-CM | POA: Diagnosis not present

## 2017-07-12 DIAGNOSIS — Z9889 Other specified postprocedural states: Secondary | ICD-10-CM | POA: Diagnosis not present

## 2017-07-12 DIAGNOSIS — R1011 Right upper quadrant pain: Secondary | ICD-10-CM | POA: Diagnosis not present

## 2017-08-06 ENCOUNTER — Ambulatory Visit: Payer: Self-pay | Admitting: Surgery

## 2017-08-06 NOTE — H&P (Signed)
History of Present Illness Donald [name]. Juan Olthoff MD; 08/06/2017 10:56 AM) The patient is a 62 year old male presenting for a post-operative visit. The patient is a 62 year old male presenting for a post-operative visit. Status post right hemicolectomy on 06/26/17 for a large cecal mass. Pathology confirmed a 4 cm cecal diverticulum filled with hard stool. There is no sign of malignancy. The patient was discharged on 07/01/17. He reports slowly improving appetite. He is off of his pain medicine prescription and is back on his maintenance chronic pain medication. He has at least one formed bowel movement daily. His energy level continues to improve. The right-sided abdominal tenderness that he mentioned last time has improved, but he still has some "pulling" when he exerts himself. This area required dissection of the mass out of the posterior surface of the abdominal wall.  The patient also has a sebaceous cyst on his scalp that he would like to have removed.   Problem List/Past Medical [name] K. Ileana Chalupa, MD; 08/06/2017 10:56 AM) HISTORY OF MAJOR ABDOMINAL SURGERY (Z98.890) ENCOUNTER FOR REMOVAL OF STAPLES (Z48.02) SOLITARY DIVERTICULUM OF RIGHT SIDE OF COLON (K57.30) SEBACEOUS CYST (L72.3)  Past Surgical History (Magen Suriano K. Vikas Wegmann, MD; 08/06/2017 10:56 AM) Anal Fissure Repair Colon Polyp Removal - Colonoscopy Knee Surgery Left. Spinal Surgery - Lower Back Spinal Surgery - Neck  Diagnostic Studies History Donald [name]. Dedria Endres, MD; 08/06/2017 10:56 AM) Colonoscopy within last year  Allergies Malachi Bonds, CMA; 08/06/2017 10:45 AM) DULoxetine HCl *ANTIDEPRESSANTS* Nausea. Flexeril *MUSCULOSKELETAL THERAPY AGENTS* Hives. Latex Rash. Adhesive Tape Rash.  Medication History Malachi Bonds, CMA; 08/06/2017 10:45 AM) OxyCODONE HCl (10MG  Tablet, Oral) Active. Cyclobenzaprine HCl (5MG  Tablet, Oral) Active. Omeprazole (40MG  Capsule DR, Oral) Active. Voltaren (1% Gel, Transdermal)  Active. Suboxone (8-2MG  Film, Sublingual) Active. Myrbetriq (25MG  Tablet ER 24HR, Oral) Active. Finasteride (5MG  Tablet, Oral as needed) Active. Meloxicam (15MG  Tablet, Oral) Active. Aspirin (325MG  Capsule, Oral) Active. Advil (200MG  Capsule, Oral) Active. Medications Reconciled  Social History Donald [name]. Satcha Storlie, MD; 08/06/2017 10:56 AM) Alcohol use Remotely quit alcohol use. Caffeine use Carbonated beverages, Coffee, Tea. Illicit drug use Remotely quit drug use. Tobacco use Former smoker.  Family History Donald [name]. Brileigh Sevcik, MD; 08/06/2017 10:56 AM) Family history unknown First Degree Relatives  Other Problems Donald [name]. Boykin Baetz, MD; 08/06/2017 10:56 AM) Arthritis Back Pain Bladder Problems Enlarged Prostate Gastroesophageal Reflux Disease Hemorrhoids Hepatitis Sleep Apnea Transfusion history    Vitals (Chemira Jones CMA; 08/06/2017 10:45 AM) 08/06/2017 10:45 AM Weight: 235 lb Height: 68in Body Surface Area: 2.19 m Body Mass Index: 35.73 kg/m  Temp.: 98.25F(Oral)  Pulse: 88 (Regular)  BP: 112/82 (Sitting, Left Arm, Standard)      Physical Exam [name] K. Shaquill Iseman MD; 08/06/2017 10:58 AM)  The physical exam findings are as follows: Note:Well-developed well-nourished Eyes: Pupils equal, round; sclera anicteric HENT: Oral mucosa moist; good dentition Neck: No masses palpated, no thyromegaly Lungs: CTA bilaterally; normal respiratory effort CV: Regular rate and rhythm; no murmurs; extremities well-perfused with no edema Abd: +bowel sounds, soft, well-healed incisions with no sign of infection; minimal RLQ tenderness with deep palpation - no palpable masses Skin: Warm, dry; no sign of jaundice Psychiatric - alert and oriented x 4; calm mood and affect   Posterior scalp there is a 2 cm subcutaneous sebaceous cyst. This does not appear to be inflamed or infected. No drainage noted.    Assessment & Plan Donald [name]. Charie Pinkus MD; 08/06/2017 10:54  AM)  SEBACEOUS CYST (L72.3)  Current Plans Schedule for Surgery -  Excision of sebaceous cyst - posterior scalp. The surgical procedure has been discussed with the patient. Potential risks, benefits, alternative treatments, and expected outcomes have been explained. All of the patient's questions at this time have been answered. The likelihood of reaching the patient's treatment goal is good. The patient understand the proposed surgical procedure and wishes to proceed.  Donald [name]. Georgette Dover, MD, [facility] Surgery  General/ Trauma Surgery  08/06/2017 10:58 AM

## 2017-08-30 ENCOUNTER — Encounter: Payer: Self-pay | Admitting: Neurology

## 2017-08-30 ENCOUNTER — Ambulatory Visit (INDEPENDENT_AMBULATORY_CARE_PROVIDER_SITE_OTHER): Payer: Medicare HMO | Admitting: Neurology

## 2017-08-30 VITALS — BP 121/79 | HR 69 | Ht 69.0 in | Wt 238.0 lb

## 2017-08-30 DIAGNOSIS — G4731 Primary central sleep apnea: Secondary | ICD-10-CM | POA: Diagnosis not present

## 2017-08-30 DIAGNOSIS — G4737 Central sleep apnea in conditions classified elsewhere: Secondary | ICD-10-CM

## 2017-08-30 DIAGNOSIS — G4730 Sleep apnea, unspecified: Secondary | ICD-10-CM | POA: Diagnosis not present

## 2017-08-30 DIAGNOSIS — F1190 Opioid use, unspecified, uncomplicated: Secondary | ICD-10-CM
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
## 2017-09-11 DIAGNOSIS — M2669 Other specified disorders of temporomandibular joint: Secondary | ICD-10-CM | POA: Diagnosis not present

## 2017-09-19 ENCOUNTER — Encounter (HOSPITAL_BASED_OUTPATIENT_CLINIC_OR_DEPARTMENT_OTHER): Payer: Self-pay | Admitting: *Deleted

## 2017-09-19 ENCOUNTER — Other Ambulatory Visit: Payer: Self-pay

## 2017-10-01 ENCOUNTER — Ambulatory Visit (INDEPENDENT_AMBULATORY_CARE_PROVIDER_SITE_OTHER): Payer: Medicare HMO | Admitting: Neurology

## 2017-10-01 DIAGNOSIS — M5137 Other intervertebral disc degeneration, lumbosacral region: Secondary | ICD-10-CM | POA: Diagnosis not present

## 2017-10-01 DIAGNOSIS — F1190 Opioid use, unspecified, uncomplicated: Secondary | ICD-10-CM

## 2017-10-01 DIAGNOSIS — G4733 Obstructive sleep apnea (adult) (pediatric): Secondary | ICD-10-CM | POA: Diagnosis not present

## 2017-10-01 DIAGNOSIS — G4730 Sleep apnea, unspecified: Secondary | ICD-10-CM

## 2017-10-01 DIAGNOSIS — G4737 Central sleep apnea in conditions classified elsewhere: Secondary | ICD-10-CM

## 2017-10-01 DIAGNOSIS — M9901 Segmental and somatic dysfunction of cervical region: Secondary | ICD-10-CM | POA: Diagnosis not present

## 2017-10-01 DIAGNOSIS — M9903 Segmental and somatic dysfunction of lumbar region: Secondary | ICD-10-CM | POA: Diagnosis not present

## 2017-10-01 DIAGNOSIS — G4731 Primary central sleep apnea: Secondary | ICD-10-CM

## 2017-10-01 DIAGNOSIS — M436 Torticollis: Secondary | ICD-10-CM | POA: Diagnosis not present

## 2017-10-05 DIAGNOSIS — G894 Chronic pain syndrome: Secondary | ICD-10-CM | POA: Diagnosis not present

## 2017-10-05 DIAGNOSIS — Z0000 Encounter for general adult medical examination without abnormal findings: Secondary | ICD-10-CM | POA: Diagnosis not present

## 2017-10-05 DIAGNOSIS — M545 Low back pain: Secondary | ICD-10-CM | POA: Diagnosis not present

## 2017-10-05 DIAGNOSIS — G4733 Obstructive sleep apnea (adult) (pediatric): Secondary | ICD-10-CM | POA: Diagnosis not present

## 2017-10-05 DIAGNOSIS — E119 Type 2 diabetes mellitus without complications: Secondary | ICD-10-CM | POA: Diagnosis not present

## 2017-10-05 DIAGNOSIS — E663 Overweight: Secondary | ICD-10-CM | POA: Diagnosis not present

## 2017-10-05 DIAGNOSIS — Z1389 Encounter for screening for other disorder: Secondary | ICD-10-CM | POA: Diagnosis not present

## 2017-10-05 DIAGNOSIS — Z125 Encounter for screening for malignant neoplasm of prostate: Secondary | ICD-10-CM | POA: Diagnosis not present

## 2017-10-05 DIAGNOSIS — Z136 Encounter for screening for cardiovascular disorders: Secondary | ICD-10-CM | POA: Diagnosis not present

## 2017-10-08 ENCOUNTER — Telehealth: Payer: Self-pay | Admitting: Neurology

## 2017-10-08 NOTE — Telephone Encounter (Signed)
Pt called requesting a call back to go over his sleep study results

## 2017-10-08 NOTE — Telephone Encounter (Signed)
Dr Brett Fairy has not read his study at this time. His test was completed on 10/01/17. I  Will make her aware that pt is calling and when she reads his test I will give him a call with results.

## 2017-10-09 ENCOUNTER — Other Ambulatory Visit: Payer: Self-pay

## 2017-10-09 ENCOUNTER — Ambulatory Visit (HOSPITAL_BASED_OUTPATIENT_CLINIC_OR_DEPARTMENT_OTHER): Payer: Medicare HMO | Admitting: Certified Registered"

## 2017-10-09 ENCOUNTER — Ambulatory Visit (HOSPITAL_BASED_OUTPATIENT_CLINIC_OR_DEPARTMENT_OTHER)
Admission: RE | Admit: 2017-10-09 | Discharge: 2017-10-09 | Disposition: A | Discharge status: Home / Self Care | Payer: Medicare HMO | Source: Ambulatory Visit | Attending: Surgery | Admitting: Surgery

## 2017-10-09 ENCOUNTER — Encounter (HOSPITAL_BASED_OUTPATIENT_CLINIC_OR_DEPARTMENT_OTHER)
Admission: RE | Disposition: A | Discharge status: Home / Self Care | Payer: Self-pay | Source: Ambulatory Visit | Attending: Surgery

## 2017-10-09 ENCOUNTER — Encounter (HOSPITAL_BASED_OUTPATIENT_CLINIC_OR_DEPARTMENT_OTHER): Payer: Self-pay | Admitting: Anesthesiology

## 2017-10-09 DIAGNOSIS — Z791 Long term (current) use of non-steroidal anti-inflammatories (NSAID): Secondary | ICD-10-CM | POA: Insufficient documentation

## 2017-10-09 DIAGNOSIS — E669 Obesity, unspecified: Secondary | ICD-10-CM | POA: Diagnosis not present

## 2017-10-09 DIAGNOSIS — Z6834 Body mass index (BMI) 34.0-34.9, adult: Secondary | ICD-10-CM | POA: Insufficient documentation

## 2017-10-09 DIAGNOSIS — Z7982 Long term (current) use of aspirin: Secondary | ICD-10-CM | POA: Diagnosis not present

## 2017-10-09 DIAGNOSIS — Z79891 Long term (current) use of opiate analgesic: Secondary | ICD-10-CM | POA: Diagnosis not present

## 2017-10-09 DIAGNOSIS — E785 Hyperlipidemia, unspecified: Secondary | ICD-10-CM | POA: Diagnosis not present

## 2017-10-09 DIAGNOSIS — Z87891 Personal history of nicotine dependence: Secondary | ICD-10-CM | POA: Insufficient documentation

## 2017-10-09 DIAGNOSIS — I10 Essential (primary) hypertension: Secondary | ICD-10-CM | POA: Diagnosis not present

## 2017-10-09 DIAGNOSIS — Z79899 Other long term (current) drug therapy: Secondary | ICD-10-CM | POA: Insufficient documentation

## 2017-10-09 DIAGNOSIS — E119 Type 2 diabetes mellitus without complications: Secondary | ICD-10-CM | POA: Diagnosis not present

## 2017-10-09 DIAGNOSIS — L723 Sebaceous cyst: Secondary | ICD-10-CM | POA: Insufficient documentation

## 2017-10-09 DIAGNOSIS — G4730 Sleep apnea, unspecified: Secondary | ICD-10-CM | POA: Diagnosis not present

## 2017-10-09 DIAGNOSIS — K219 Gastro-esophageal reflux disease without esophagitis: Secondary | ICD-10-CM | POA: Insufficient documentation

## 2017-10-09 DIAGNOSIS — Z9989 Dependence on other enabling machines and devices: Secondary | ICD-10-CM | POA: Insufficient documentation

## 2017-10-09 HISTORY — PX: MASS EXCISION: SHX2000

## 2017-10-09 HISTORY — DX: Personal history of diseases of the skin and subcutaneous tissue: Z87.2

## 2017-10-09 SURGERY — EXCISION MASS
Anesthesia: Monitor Anesthesia Care | Site: Scalp

## 2017-10-09 MED ORDER — CEFAZOLIN SODIUM-DEXTROSE 2-4 GM/100ML-% IV SOLN
2.0000 g | INTRAVENOUS | Status: AC
  Administered 2017-10-09: 2 g via INTRAVENOUS

## 2017-10-09 MED ORDER — EPHEDRINE SULFATE 50 MG/ML IJ SOLN
INTRAMUSCULAR | Status: AC
  Filled 2017-10-09: qty 1

## 2017-10-09 MED ORDER — LACTATED RINGERS IV SOLN
INTRAVENOUS | Status: DC
  Administered 2017-10-09: 09:00:00 via INTRAVENOUS

## 2017-10-09 MED ORDER — CHLORHEXIDINE GLUCONATE CLOTH 2 % EX PADS: 6.0000 | MEDICATED_PAD | Freq: Once | CUTANEOUS | Status: DC

## 2017-10-09 MED ORDER — HYDROCODONE-ACETAMINOPHEN 5-325 MG PO TABS: 1.0000 | ORAL_TABLET | Freq: Four times a day (QID) | ORAL | 0 refills | Status: DC | PRN

## 2017-10-09 MED ORDER — PROPOFOL 10 MG/ML IV BOLUS
INTRAVENOUS | Status: AC
  Filled 2017-10-09: qty 40

## 2017-10-09 MED ORDER — PROMETHAZINE HCL 25 MG/ML IJ SOLN: 6.2500 mg | INTRAMUSCULAR | Status: DC | PRN

## 2017-10-09 MED ORDER — FENTANYL CITRATE (PF) 100 MCG/2ML IJ SOLN
50.0000 ug | INTRAMUSCULAR | Status: DC | PRN
  Administered 2017-10-09 (×2): 50 ug via INTRAVENOUS

## 2017-10-09 MED ORDER — MIDAZOLAM HCL 2 MG/2ML IJ SOLN
INTRAMUSCULAR | Status: AC
  Filled 2017-10-09: qty 2

## 2017-10-09 MED ORDER — PROPOFOL 500 MG/50ML IV EMUL
INTRAVENOUS | Status: DC | PRN
  Administered 2017-10-09: 200 ug/kg/min via INTRAVENOUS

## 2017-10-09 MED ORDER — ROCURONIUM BROMIDE 50 MG/5ML IV SOLN
INTRAVENOUS | Status: AC
  Filled 2017-10-09: qty 1

## 2017-10-09 MED ORDER — ONDANSETRON HCL 4 MG/2ML IJ SOLN
INTRAMUSCULAR | Status: DC | PRN
  Administered 2017-10-09: 4 mg via INTRAVENOUS

## 2017-10-09 MED ORDER — SCOPOLAMINE 1 MG/3DAYS TD PT72: 1.0000 | MEDICATED_PATCH | Freq: Once | TRANSDERMAL | Status: DC | PRN

## 2017-10-09 MED ORDER — FENTANYL CITRATE (PF) 100 MCG/2ML IJ SOLN: 25.0000 ug | INTRAMUSCULAR | Status: DC | PRN

## 2017-10-09 MED ORDER — CEFAZOLIN SODIUM-DEXTROSE 2-4 GM/100ML-% IV SOLN
INTRAVENOUS | Status: AC
  Filled 2017-10-09: qty 100

## 2017-10-09 MED ORDER — HYDROCODONE-ACETAMINOPHEN 7.5-325 MG PO TABS: 1.0000 | ORAL_TABLET | Freq: Once | ORAL | Status: DC | PRN

## 2017-10-09 MED ORDER — ONDANSETRON HCL 4 MG/2ML IJ SOLN
INTRAMUSCULAR | Status: AC
  Filled 2017-10-09: qty 2

## 2017-10-09 MED ORDER — MIDAZOLAM HCL 2 MG/2ML IJ SOLN: 1.0000 mg | INTRAMUSCULAR | Status: DC | PRN

## 2017-10-09 MED ORDER — LIDOCAINE HCL (CARDIAC) PF 100 MG/5ML IV SOSY
PREFILLED_SYRINGE | INTRAVENOUS | Status: AC
Start: 2017-10-09 — End: 2017-10-09
  Filled 2017-10-09: qty 5

## 2017-10-09 MED ORDER — GLYCOPYRROLATE 0.2 MG/ML IJ SOLN
INTRAMUSCULAR | Status: DC | PRN
  Administered 2017-10-09: 0.2 mg via INTRAVENOUS

## 2017-10-09 MED ORDER — FENTANYL CITRATE (PF) 100 MCG/2ML IJ SOLN
INTRAMUSCULAR | Status: AC
  Filled 2017-10-09: qty 2

## 2017-10-09 MED ORDER — MEPERIDINE HCL 25 MG/ML IJ SOLN: 6.2500 mg | INTRAMUSCULAR | Status: DC | PRN

## 2017-10-09 MED ORDER — BUPIVACAINE-EPINEPHRINE 0.25% -1:200000 IJ SOLN
INTRAMUSCULAR | Status: DC | PRN
  Administered 2017-10-09: 3 mL

## 2017-10-09 SURGICAL SUPPLY — 41 items
BENZOIN TINCTURE PRP APPL 2/3 (GAUZE/BANDAGES/DRESSINGS) IMPLANT
BLADE CLIPPER SURG (BLADE) IMPLANT
BLADE SURG 15 STRL LF DISP TIS (BLADE) ×1 IMPLANT
BLADE SURG 15 STRL SS (BLADE) ×1
CANISTER SUCT 1200ML W/VALVE (MISCELLANEOUS) IMPLANT
CHLORAPREP W/TINT 26ML (MISCELLANEOUS) ×2 IMPLANT
COVER BACK TABLE 60X90IN (DRAPES) ×2 IMPLANT
COVER MAYO STAND STRL (DRAPES) ×2 IMPLANT
DECANTER SPIKE VIAL GLASS SM (MISCELLANEOUS) IMPLANT
DRAPE LAPAROTOMY 100X72 PEDS (DRAPES) ×2 IMPLANT
DRAPE UTILITY XL STRL (DRAPES) ×2 IMPLANT
DRSG TEGADERM 2-3/8X2-3/4 SM (GAUZE/BANDAGES/DRESSINGS) IMPLANT
DRSG TEGADERM 4X4.75 (GAUZE/BANDAGES/DRESSINGS) IMPLANT
ELECT COATED BLADE 2.86 ST (ELECTRODE) ×2 IMPLANT
ELECT REM PT RETURN 9FT ADLT (ELECTROSURGICAL) ×2
ELECTRODE REM PT RTRN 9FT ADLT (ELECTROSURGICAL) ×1 IMPLANT
GAUZE SPONGE 4X4 12PLY STRL LF (GAUZE/BANDAGES/DRESSINGS) IMPLANT
GLOVE BIO SURGEON STRL SZ7 (GLOVE) IMPLANT
GLOVE BIOGEL PI IND STRL 7.5 (GLOVE) ×1 IMPLANT
GLOVE BIOGEL PI INDICATOR 7.5 (GLOVE) ×1
GLOVE SURG SS PI 7.0 STRL IVOR (GLOVE) ×6 IMPLANT
GOWN STRL REUS W/ TWL LRG LVL3 (GOWN DISPOSABLE) ×2 IMPLANT
GOWN STRL REUS W/TWL LRG LVL3 (GOWN DISPOSABLE) ×2
NEEDLE HYPO 25X1 1.5 SAFETY (NEEDLE) ×2 IMPLANT
NS IRRIG 1000ML POUR BTL (IV SOLUTION) IMPLANT
PACK BASIN DAY SURGERY FS (CUSTOM PROCEDURE TRAY) ×2 IMPLANT
PENCIL BUTTON HOLSTER BLD 10FT (ELECTRODE) ×2 IMPLANT
SLEEVE SCD COMPRESS KNEE MED (MISCELLANEOUS) IMPLANT
SPONGE GAUZE 2X2 8PLY STRL LF (GAUZE/BANDAGES/DRESSINGS) IMPLANT
STRIP CLOSURE SKIN 1/2X4 (GAUZE/BANDAGES/DRESSINGS) ×2 IMPLANT
SUT MON AB 4-0 PC3 18 (SUTURE) IMPLANT
SUT PROLENE 6 0 P 1 18 (SUTURE) IMPLANT
SUT SILK 2 0 PERMA HAND 18 BK (SUTURE) IMPLANT
SUT VIC AB 3-0 SH 27 (SUTURE) ×1
SUT VIC AB 3-0 SH 27X BRD (SUTURE) ×1 IMPLANT
SYR BULB 3OZ (MISCELLANEOUS) ×2 IMPLANT
SYR CONTROL 10ML LL (SYRINGE) ×2 IMPLANT
TOWEL OR 17X24 6PK STRL BLUE (TOWEL DISPOSABLE) ×2 IMPLANT
TOWEL OR NON WOVEN STRL DISP B (DISPOSABLE) ×2 IMPLANT
TUBE CONNECTING 20X1/4 (TUBING) ×2 IMPLANT
YANKAUER SUCT BULB TIP NO VENT (SUCTIONS) ×2 IMPLANT

## 2017-10-09 NOTE — Op Note (Signed)
Preop diagnosis: Sebaceous cyst posterior scalp (1.5 cm) Postop diagnosis: Same Procedure performed: Excision of sebaceous cyst posterior scalp Surgeon:Maxson Oddo K Addy Mcmannis Anesthesia: Local MAC Indications: This is a 62 year old male who presents with a gradually enlarging mass on his posterior scalp.  This is located in the skin.  It appears to be a sebaceous cyst.  This has begun to cause some discomfort so he presents now for excision.  Description of procedure: The patient is brought to the operating room and placed in the lateral position on his left side.  He was secured to the table.  After an adequate level of intravenous sedation was given, his posterior scalp was prepped with ChloraPrep and draped sterile fashion.  A timeout was taken.  We infiltrated the area of the mass with 0.25% Marcaine with epinephrine.  A transverse incision was made.  We dissected down to the surface of the cyst.  We were able to bluntly dissect around the entire cyst and deliver it intact.  Hemostasis was obtained with cautery.  We closed the wound with a deep layer of 3-0 Vicryl and a subcuticular layer 4-0 Monocryl.  Dermabond was applied.  The specimen was examined on the back table.  It was open and it is obviously a sebaceous cyst.  There is no need to send this for pathologic examination.  The patient was moved back to supine position on the stretcher.  He was brought to the recovery room in stable condition.  All sponge, instrument, and needle counts are correct.  Imogene Burn. Georgette Dover, MD, [facility] Surgery  General/ Trauma Surgery  10/09/2017 10:57 AM

## 2017-10-09 NOTE — Discharge Instructions (Signed)
Post Anesthesia Home Care Instructions  Activity: Get plenty of rest for the remainder of the day. A responsible individual must stay with you for 24 hours following the procedure.  For the next 24 hours, DO NOT: -Drive a car -Paediatric nurse -Drink alcoholic beverages -Take any medication unless instructed by your physician -Make any legal decisions or sign important papers.  Meals: Start with liquid foods such as gelatin or soup. Progress to regular foods as tolerated. Avoid greasy, spicy, heavy foods. If nausea and/or vomiting occur, drink only clear liquids until the nausea and/or vomiting subsides. Call your physician if vomiting continues.  Special Instructions/Symptoms: Your throat may feel dry or sore from the anesthesia or the breathing tube placed in your throat during surgery. If this causes discomfort, gargle with warm salt water. The discomfort should disappear within 24 hours.  If you had a scopolamine patch placed behind your ear for the management of post- operative nausea and/or vomiting:  1. The medication in the patch is effective for 72 hours, after which it should be removed.  Wrap patch in a tissue and discard in the trash. Wash hands thoroughly with soap and water. 2. You may remove the patch earlier than 72 hours if you experience unpleasant side effects which may include dry mouth, dizziness or visual disturbances. 3. Avoid touching the patch. Wash your hands with soap and water after contact with the patch.    [place] Office Phone Number [phone]   POST OP INSTRUCTIONS  Always review your discharge instruction sheet given to you by the facility where your surgery was performed.  IF YOU HAVE DISABILITY OR FAMILY LEAVE FORMS, YOU MUST BRING THEM TO THE OFFICE FOR PROCESSING.  DO NOT GIVE THEM TO YOUR DOCTOR.  1. A prescription for pain medication may be given to you upon discharge.  Take your pain medication as prescribed, if needed.   If narcotic pain medicine is not needed, then you may take acetaminophen (Tylenol) or ibuprofen (Advil) as needed. 2. Take your usually prescribed medications unless otherwise directed 3. If you need a refill on your pain medication, please contact your pharmacy.  They will contact our office to request authorization.  Prescriptions will not be filled after 5pm or on week-ends. 4. You should eat very light the first 24 hours after surgery, such as soup, crackers, pudding, etc.  Resume your normal diet the day after surgery. 5. Most patients will experience some swelling and bruising around the surgical site.  Ice packs will help.  Swelling and bruising can take several days to resolve.  6. It is common to experience some constipation if taking pain medication after surgery.  Increasing fluid intake and taking a stool softener will usually help or prevent this problem from occurring.  A mild laxative (Milk of Magnesia or Miralax) should be taken according to package directions if there are no bowel movements after 48 hours. 7. You may shower over the incision in 24 hours.  Do not scrub this area.  The skin glue will flake off over the next week or two. 8. ACTIVITIES:  You may resume regular daily activities (gradually increasing) beginning the next day.   You may have sexual intercourse when it is comfortable. a. You may drive when you no longer are taking prescription pain medication, you can comfortably wear a seatbelt, and you can safely maneuver your car and apply brakes. b. RETURN TO WORK:  1-2 weeks 9. You should see your doctor in the office for  a follow-up appointment approximately two to three weeks after your surgery.    WHEN TO CALL YOUR DOCTOR: 1. Fever over 101.0 2. Nausea and/or vomiting. 3. Extreme swelling or bruising. 4. Continued bleeding from incision. 5. Increased pain, redness, or drainage from the incision.  The clinic staff is available to answer your questions during regular  business hours.  Please dont hesitate to call and ask to speak to one of the nurses for clinical concerns.  If you have a medical emergency, go to the nearest emergency room or call 911.  A surgeon from [facility] is always on call at the hospital.  For further questions, please visit [email or web address]

## 2017-10-09 NOTE — Anesthesia Postprocedure Evaluation (Signed)
Anesthesia Post Note  Patient: Donald [name]  Procedure(s) Performed: EXCISION SEBACEOUS CYST POSTERIOR SCALP (N/A Scalp)     Patient location during evaluation: PACU Anesthesia Type: MAC Level of consciousness: awake and alert and oriented Pain management: pain level controlled Vital Signs Assessment: post-procedure vital signs reviewed and stable Respiratory status: spontaneous breathing, nonlabored ventilation and respiratory function stable Cardiovascular status: stable and blood pressure returned to baseline Postop Assessment: no apparent nausea or vomiting Anesthetic complications: no    Last Vitals:  Vitals:   10/09/17 1100 10/09/17 1115  BP: 110/69 115/79  Pulse: 71 (!) 51  Resp: 17 (!) 9  Temp: 36.7 C   SpO2: 100% 98%    Last Pain:  Vitals:   10/09/17 1115  TempSrc:   PainSc: 0-No pain                 Caty Tessler A.
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
## 2017-10-09 NOTE — Transfer of Care (Signed)
Immediate Anesthesia Transfer of Care Note  Patient: Donald [name]  Procedure(s) Performed: EXCISION SEBACEOUS CYST POSTERIOR SCALP (N/A Scalp)  Patient Location: PACU  Anesthesia Type:MAC  Level of Consciousness: awake, alert  and oriented  Airway & Oxygen Therapy: Patient Spontanous Breathing and Patient connected to face mask oxygen  Post-op Assessment: Report given to RN and Post -op Vital signs reviewed and stable  Post vital signs: Reviewed and stable  Last Vitals:  Vitals Value Taken Time  BP    Temp    Pulse 67 10/09/2017 10:59 AM  Resp    SpO2 99 % 10/09/2017 10:59 AM  Vitals shown include unvalidated device data.  Last Pain:  Vitals:   10/09/17 0908  TempSrc: Oral         Complications: No apparent anesthesia complications

## 2017-10-09 NOTE — H&P (Signed)
History of Present Illness  The patient is a 62 year old male presenting for a post-operative visit. The patient is a 62 year old male presenting for a post-operative visit. Status post right hemicolectomy on 06/26/17 for a large cecal mass. Pathology confirmed a 4 cm cecal diverticulum filled with hard stool. There is no sign of malignancy. The patient was discharged on 07/01/17. He reports slowly improving appetite. He is off of his pain medicine prescription and is back on his maintenance chronic pain medication. He has at least one formed bowel movement daily. His energy level continues to improve. The right-sided abdominal tenderness that he mentioned last time has improved, but he still has some "pulling" when he exerts himself. This area required dissection of the mass out of the posterior surface of the abdominal wall.  The patient also has a sebaceous cyst on his scalp that he would like to have removed.   Problem List/Past Medical  HISTORY OF MAJOR ABDOMINAL SURGERY (Z98.890) ENCOUNTER FOR REMOVAL OF STAPLES (Z48.02) SOLITARY DIVERTICULUM OF RIGHT SIDE OF COLON (K57.30) SEBACEOUS CYST (L72.3)  Past Surgical History  Anal Fissure Repair Colon Polyp Removal - Colonoscopy Knee Surgery Left. Spinal Surgery - Lower Back Spinal Surgery - Neck  Diagnostic Studies History  Colonoscopy within last year  Allergies  DULoxetine HCl *ANTIDEPRESSANTS* Nausea. Flexeril *MUSCULOSKELETAL THERAPY AGENTS* Hives. Latex Rash. Adhesive Tape Rash.  Medication History  OxyCODONE HCl (10MG  Tablet, Oral) Active. Cyclobenzaprine HCl (5MG  Tablet, Oral) Active. Omeprazole (40MG  Capsule DR, Oral) Active. Voltaren (1% Gel, Transdermal) Active. Suboxone (8-2MG  Film, Sublingual) Active. Myrbetriq (25MG  Tablet ER 24HR, Oral) Active. Finasteride (5MG  Tablet, Oral as needed) Active. Meloxicam (15MG  Tablet, Oral) Active. Aspirin (325MG  Capsule, Oral) Active. Advil  (200MG  Capsule, Oral) Active. Medications Reconciled  Social History  Alcohol use Remotely quit alcohol use. Caffeine use Carbonated beverages, Coffee, Tea. Illicit drug use Remotely quit drug use. Tobacco use Former smoker.  Family History  Family history unknown First Degree Relatives  Other Problems  Arthritis Back Pain Bladder Problems Enlarged Prostate Gastroesophageal Reflux Disease Hemorrhoids Hepatitis Sleep Apnea Transfusion history    Vitals  Weight: 235 lb Height: 68in Body Surface Area: 2.19 m Body Mass Index: 35.73 kg/m  Temp.: 98.36F(Oral)  Pulse: 88 (Regular)  BP: 112/82 (Sitting, Left Arm, Standard)      Physical Exam   The physical exam findings are as follows: Note:Well-developed well-nourished Eyes: Pupils equal, round; sclera anicteric HENT: Oral mucosa moist; good dentition Neck: No masses palpated, no thyromegaly Lungs: CTA bilaterally; normal respiratory effort CV: Regular rate and rhythm; no murmurs; extremities well-perfused with no edema Abd: +bowel sounds, soft, well-healed incisions with no sign of infection; minimal RLQ tenderness with deep palpation - no palpable masses Skin: Warm, dry; no sign of jaundice Psychiatric - alert and oriented x 4; calm mood and affect   Posterior scalp there is a 2 cm subcutaneous sebaceous cyst. This does not appear to be inflamed or infected. No drainage noted.    Assessment & Plan   SEBACEOUS CYST (L72.3)  Current Plans Schedule for Surgery - Excision of sebaceous cyst - posterior scalp. The surgical procedure has been discussed with the patient. Potential risks, benefits, alternative treatments, and expected outcomes have been explained. All of the patient's questions at this time have been answered. The likelihood of reaching the patient's treatment goal is good. The patient understand the proposed surgical procedure and wishes to  proceed.   Imogene Burn. Georgette Dover, MD, [facility]  General/ Trauma  Surgery  10/09/2017 9:26 AM

## 2017-10-10 ENCOUNTER — Encounter (HOSPITAL_BASED_OUTPATIENT_CLINIC_OR_DEPARTMENT_OTHER): Payer: Self-pay | Admitting: Surgery

## 2017-10-11 DIAGNOSIS — H524 Presbyopia: Secondary | ICD-10-CM | POA: Diagnosis not present

## 2017-10-11 DIAGNOSIS — H52203 Unspecified astigmatism, bilateral: Secondary | ICD-10-CM | POA: Diagnosis not present

## 2017-10-11 DIAGNOSIS — H5213 Myopia, bilateral: Secondary | ICD-10-CM | POA: Diagnosis not present

## 2017-10-11 NOTE — Procedures (Signed)
[facility] [address] [place], [place] [address] NAME:  Donald [name]                                                                   DOB: 1956-05-10 MEDICAL RECORD NUMBER  [id number]                                                  DOS: 10/01/2017 REFERRING PHYSICIAN: Andrey Spearman, MD STUDY PERFORMED: Home Sleep Study on apnea link  HISTORY: Donald [name] is a 62 year old African-American gentleman who had undergone polysomnography on 09 December 2015 followed by a CPAP titration for severe obstructive sleep apnea on 28 December 2015.  Baseline AHI was 47.2, interestingly more severe in non-supine position.  REM AHI was as high as 66.8/h, he had 68 minutes of desaturation time total.   There were 74 central apneas, 57 obstructive apneas and 11 mixed apneas as well as 83 hypopneas.   When he underwent CPAP titration he responded exquisitely well to 8 cm water pressure only and used an air fit P 10 nasal pillow in medium size.  Donald [name] states that he became sick and had to repeatedly sinus infections and bronchitis, and felt that CPAP contributed to this. He quit using it. Now I he presents with excessive daytime sleepiness and needs to requalify.  Epworth 15 points, FSS at 29 points.  BMI: 35.1   STUDY RESULTS:  Total Recording Time:  9 hours and 4 minutes, valid test time 8 h and 46 min. Total Apnea/Hypopnea Index (AHI):  53.1/h, RDI: 54.8 /h. Average Oxygen Saturation: 91%, with nadir of Oxygen Desaturation at 71 %.  Total Time in Oxygen Saturation below 89 %: 37minutes.  Average Heart Rate:  64 bpm (between 53 and 89 bpm).  IMPRESSION: Confirmed severe sleep apnea with prolonged oxygen desaturation( hypoxemia) and loud snoring  RECOMMENDATION: 1) Return to CPAP therapy ASAP.  2)The patient should resume CPAP autoset 6-12 cm water an heated humidity, with nasal pillows ( AirFit P10 medium) or mask of his choice.  3) urgently issue a CPAP machine and  supplies in preparation for Surgery on [id number]   I certify that I have reviewed the raw data recording prior to the issuance of this report in accordance with the standards of the American Academy of Sleep Medicine (AASM).  Larey Seat, M.D.    10-11-2017  Medical Director of [facility] at [facility], accredited by the AASM. Diplomat of the ABPN and ABSM.

## 2017-10-11 NOTE — Addendum Note (Signed)
Addended by: Larey Seat on: 10/11/2017 07:51 PM   Modules accepted: Orders

## 2017-10-12 ENCOUNTER — Telehealth: Payer: Self-pay | Admitting: *Deleted

## 2017-10-12 NOTE — Telephone Encounter (Signed)
Donald [name], I like to thank you for making this referral to [facility] urgently.

## 2017-10-12 NOTE — Telephone Encounter (Signed)
I called Jeneen Rinks (rep for [facility]) at [phone] and advised pt needs to be set up for CPAP ASAP d/t having surgery scheduled on 10/19/17. Verified insurance w/ him. He will speak with girls who process referrals and have them work on this ASAP. Advised I faxed order, demographics, insurance, office note prior to sleep study to [phone]. Advised him to call back prior to 12pm if they do not receive. He said they will have to obtain authorization via insurance which takes 2-3 days. They should be able to get pt set up. Nothing further needed at this time.

## 2017-10-15 ENCOUNTER — Other Ambulatory Visit: Payer: Self-pay | Admitting: Neurology

## 2017-10-15 DIAGNOSIS — G4730 Sleep apnea, unspecified: Secondary | ICD-10-CM

## 2017-10-15 NOTE — Telephone Encounter (Signed)
I have called the pt to make sure he is aware of his sleep study results. No answer. LVM for him to call back.

## 2017-10-15 NOTE — Telephone Encounter (Signed)
Pt called back and we discussed the sleep study. Pt is an already established cpap user. He has already had contact with [facility] and has an apt with them. His surgery has already taken place. I made sure and scheduled apt in September so that the patient can follow up and make sure the changes are good. Pt verbalized understanding.

## 2017-10-15 NOTE — Telephone Encounter (Signed)
Pt called said he was returning Casey's call. Please call to advise

## 2017-10-15 NOTE — Telephone Encounter (Signed)
I have called the patient back. No answer again. I did LVM for the pt informing him of the results and that the orders have been sent to [facility]. I have asked the pt to call back to discuss further.

## 2017-10-17 DIAGNOSIS — G4733 Obstructive sleep apnea (adult) (pediatric): Secondary | ICD-10-CM | POA: Diagnosis not present

## 2017-12-13 DIAGNOSIS — R6884 Jaw pain: Secondary | ICD-10-CM | POA: Diagnosis not present

## 2017-12-27 DIAGNOSIS — G4733 Obstructive sleep apnea (adult) (pediatric): Secondary | ICD-10-CM | POA: Diagnosis not present

## 2018-01-02 DIAGNOSIS — G894 Chronic pain syndrome: Secondary | ICD-10-CM | POA: Diagnosis not present

## 2018-01-02 DIAGNOSIS — M545 Low back pain: Secondary | ICD-10-CM | POA: Diagnosis not present

## 2018-01-16 DIAGNOSIS — G4733 Obstructive sleep apnea (adult) (pediatric): Secondary | ICD-10-CM | POA: Diagnosis not present

## 2018-01-30 ENCOUNTER — Encounter: Payer: Self-pay | Admitting: Neurology

## 2018-01-31 ENCOUNTER — Encounter: Payer: Self-pay | Admitting: Neurology

## 2018-01-31 ENCOUNTER — Ambulatory Visit (INDEPENDENT_AMBULATORY_CARE_PROVIDER_SITE_OTHER): Payer: Medicare HMO | Admitting: Neurology

## 2018-01-31 VITALS — BP 139/83 | HR 72 | Ht 70.0 in | Wt 250.0 lb

## 2018-01-31 DIAGNOSIS — G4733 Obstructive sleep apnea (adult) (pediatric): Secondary | ICD-10-CM | POA: Diagnosis not present
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
## 2018-01-31 NOTE — Patient Instructions (Signed)

## 2018-01-31 NOTE — Addendum Note (Signed)
Addended by: Larey Seat on: 01/31/2018 10:20 AM   Modules accepted: Orders

## 2018-04-01 DIAGNOSIS — G894 Chronic pain syndrome: Secondary | ICD-10-CM | POA: Diagnosis not present

## 2018-04-01 DIAGNOSIS — M545 Low back pain: Secondary | ICD-10-CM | POA: Diagnosis not present

## 2018-04-03 DIAGNOSIS — G4733 Obstructive sleep apnea (adult) (pediatric): Secondary | ICD-10-CM | POA: Diagnosis not present

## 2018-04-09 DIAGNOSIS — G894 Chronic pain syndrome: Secondary | ICD-10-CM | POA: Diagnosis not present

## 2018-04-09 DIAGNOSIS — G4733 Obstructive sleep apnea (adult) (pediatric): Secondary | ICD-10-CM | POA: Diagnosis not present

## 2018-04-09 DIAGNOSIS — E1169 Type 2 diabetes mellitus with other specified complication: Secondary | ICD-10-CM | POA: Diagnosis not present

## 2018-04-17 DIAGNOSIS — M542 Cervicalgia: Secondary | ICD-10-CM | POA: Diagnosis not present

## 2018-04-17 DIAGNOSIS — M545 Low back pain: Secondary | ICD-10-CM | POA: Diagnosis not present

## 2018-04-17 DIAGNOSIS — R269 Unspecified abnormalities of gait and mobility: Secondary | ICD-10-CM | POA: Diagnosis not present

## 2018-04-17 DIAGNOSIS — G894 Chronic pain syndrome: Secondary | ICD-10-CM | POA: Diagnosis not present

## 2018-04-29 DIAGNOSIS — G894 Chronic pain syndrome: Secondary | ICD-10-CM | POA: Diagnosis not present

## 2018-04-29 DIAGNOSIS — R269 Unspecified abnormalities of gait and mobility: Secondary | ICD-10-CM | POA: Diagnosis not present

## 2018-04-29 DIAGNOSIS — M542 Cervicalgia: Secondary | ICD-10-CM | POA: Diagnosis not present

## 2018-04-29 DIAGNOSIS — M545 Low back pain: Secondary | ICD-10-CM | POA: Diagnosis not present

## 2018-06-05 DIAGNOSIS — U071 COVID-19: Secondary | ICD-10-CM

## 2018-06-05 HISTORY — DX: COVID-19: U07.1

## 2018-07-03 DIAGNOSIS — G5693 Unspecified mononeuropathy of bilateral upper limbs: Secondary | ICD-10-CM | POA: Diagnosis not present

## 2018-07-03 DIAGNOSIS — F331 Major depressive disorder, recurrent, moderate: Secondary | ICD-10-CM | POA: Diagnosis not present

## 2018-07-03 DIAGNOSIS — M25562 Pain in left knee: Secondary | ICD-10-CM | POA: Diagnosis not present

## 2018-07-03 DIAGNOSIS — M545 Low back pain: Secondary | ICD-10-CM | POA: Diagnosis not present

## 2018-07-03 DIAGNOSIS — M25561 Pain in right knee: Secondary | ICD-10-CM | POA: Diagnosis not present

## 2018-07-03 DIAGNOSIS — G894 Chronic pain syndrome: Secondary | ICD-10-CM | POA: Diagnosis not present

## 2018-07-08 DIAGNOSIS — G4733 Obstructive sleep apnea (adult) (pediatric): Secondary | ICD-10-CM | POA: Diagnosis not present

## 2018-07-11 DIAGNOSIS — M25569 Pain in unspecified knee: Secondary | ICD-10-CM | POA: Diagnosis not present

## 2018-07-11 DIAGNOSIS — R21 Rash and other nonspecific skin eruption: Secondary | ICD-10-CM | POA: Diagnosis not present

## 2018-07-11 DIAGNOSIS — E119 Type 2 diabetes mellitus without complications: Secondary | ICD-10-CM | POA: Diagnosis not present

## 2018-07-11 DIAGNOSIS — M25551 Pain in right hip: Secondary | ICD-10-CM | POA: Diagnosis not present

## 2018-07-19 DIAGNOSIS — M2550 Pain in unspecified joint: Secondary | ICD-10-CM | POA: Diagnosis not present

## 2018-08-15 ENCOUNTER — Ambulatory Visit (INDEPENDENT_AMBULATORY_CARE_PROVIDER_SITE_OTHER): Payer: Medicare HMO

## 2018-08-15 ENCOUNTER — Other Ambulatory Visit: Payer: Self-pay

## 2018-08-15 ENCOUNTER — Ambulatory Visit (INDEPENDENT_AMBULATORY_CARE_PROVIDER_SITE_OTHER): Payer: Medicare HMO | Admitting: Specialist

## 2018-08-15 ENCOUNTER — Encounter (INDEPENDENT_AMBULATORY_CARE_PROVIDER_SITE_OTHER): Payer: Self-pay | Admitting: Specialist

## 2018-08-15 VITALS — BP 123/77 | HR 71 | Ht 70.0 in | Wt 215.0 lb

## 2018-08-15 DIAGNOSIS — M545 Low back pain, unspecified: Secondary | ICD-10-CM

## 2018-08-15 DIAGNOSIS — M25551 Pain in right hip: Secondary | ICD-10-CM

## 2018-08-15 DIAGNOSIS — M5136 Other intervertebral disc degeneration, lumbar region: Secondary | ICD-10-CM | POA: Diagnosis not present

## 2018-08-15 DIAGNOSIS — M25552 Pain in left hip: Secondary | ICD-10-CM | POA: Diagnosis not present

## 2018-08-15 DIAGNOSIS — M160 Bilateral primary osteoarthritis of hip: Secondary | ICD-10-CM

## 2018-08-15 MED ORDER — DICLOFENAC SODIUM 75 MG PO TBEC: 75.0000 mg | DELAYED_RELEASE_TABLET | Freq: Two times a day (BID) | ORAL | 3 refills | Status: DC
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
## 2018-08-15 NOTE — Patient Instructions (Signed)
  Hips are suffering from osteoarthritis, only real proven treatments are Weight loss, NSIADs like diclofenac and exercise. Well padded shoes help. Ice or heat 2-3 times a day 15-20 mins at a time. Appointment to see Dr. Ninfa Linden to consider total hip replacement.

## 2018-08-16 ENCOUNTER — Telehealth (INDEPENDENT_AMBULATORY_CARE_PROVIDER_SITE_OTHER): Payer: Self-pay | Admitting: Specialist

## 2018-08-16 NOTE — Telephone Encounter (Signed)
Patient Donald [name] stating he was giving a Rx from Dr [name] on 08/15/18 and he has had an adverse reaction to the medication. Please call asap

## 2018-08-16 NOTE — Telephone Encounter (Signed)
I updated this in the patients chart to reflect the new allergy.

## 2018-08-16 NOTE — Telephone Encounter (Signed)
I called patient back earlier and he states that last night he was having difficulty with breathing, and his tongue swelling.  He states that his breathing is back to normal and that his tongue is still swollen some.  He states that he wanted to let us know that this happened and that he wants to know what else he can do.  He has stopped the medication. I did advise that if this got worse that he needs to get to the [facility].  He will start back on the advil that he was taking prior to the Diclofenac tablets.--I will discuss this on Monday with Dr. [name]

## 2018-08-20 MED ORDER — METHYLPREDNISOLONE 4 MG PO TABS: ORAL_TABLET | ORAL | 0 refills | Status: DC

## 2018-08-20 NOTE — Telephone Encounter (Signed)
I called and to check on patient, he states that he is better. He states that Dr. Flavia Shipper knew that he had issues with his breathing and asked why he rx'd a medication that would bother him, I advised that he had a reaction to the medication that we could not see was going to happen therefore he had had an allergic reaction which has since been noted in the chart.  He wanted Dr. [name] to rx something else, per Dr. [name] he didn't want to rx another anti-inflammatory due to the risk of it happening again, but Dr. [name] was going to send in [facility] however patient is on subaxone so he was not able to rx this either.  Dr. [name] did make a decision to send in medrol for him and he will take 4mg  for 2 weeks and 2 mg for 2 weeks.  Patient understands this.  However he states that Dr. [name] was treating the inflammation and not the pain.  I advised that is what the diclofenac was for and since he had the reaction he was going to treat it with the Ultracet which is tramadol and tylenol but that he cannot do this either because of the subaxone that he is taking.  However patient states that he doesn't want to be on any narcotics for the pain.  I advised that the medrol he was getting would last him till he saw Dr. Ninfa Linden on 09/05/2018. To take it and let us know how he does.  He states that he understands.

## 2018-08-20 NOTE — Addendum Note (Signed)
Addended by: Basil Dess on: 08/20/2018 12:12 PM   Modules accepted: Orders

## 2018-09-05 ENCOUNTER — Other Ambulatory Visit: Payer: Self-pay

## 2018-09-05 ENCOUNTER — Encounter (INDEPENDENT_AMBULATORY_CARE_PROVIDER_SITE_OTHER): Payer: Self-pay | Admitting: Orthopaedic Surgery

## 2018-09-05 ENCOUNTER — Ambulatory Visit (INDEPENDENT_AMBULATORY_CARE_PROVIDER_SITE_OTHER): Payer: Medicare HMO | Admitting: Orthopaedic Surgery

## 2018-09-05 VITALS — Ht 70.0 in | Wt 215.0 lb

## 2018-09-05 DIAGNOSIS — M1611 Unilateral primary osteoarthritis, right hip: Secondary | ICD-10-CM | POA: Diagnosis not present

## 2018-09-05 DIAGNOSIS — M1612 Unilateral primary osteoarthritis, left hip: Secondary | ICD-10-CM | POA: Diagnosis not present

## 2018-09-05 DIAGNOSIS — M160 Bilateral primary osteoarthritis of hip: Secondary | ICD-10-CM
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
## 2018-09-11 ENCOUNTER — Telehealth (INDEPENDENT_AMBULATORY_CARE_PROVIDER_SITE_OTHER): Payer: Self-pay | Admitting: Orthopaedic Surgery

## 2018-09-11 MED ORDER — METHYLPREDNISOLONE 4 MG PO TABS: ORAL_TABLET | ORAL | 0 refills | Status: AC

## 2018-09-11 NOTE — Telephone Encounter (Signed)
Please advise 

## 2018-09-11 NOTE — Telephone Encounter (Signed)
Patient called requesting an RX refill on his Prednisone.  Patient uses [facility] on the corner of [address].  CB#[phone].  Thank you.

## 2018-09-30 DIAGNOSIS — M545 Low back pain: Secondary | ICD-10-CM | POA: Diagnosis not present

## 2018-09-30 DIAGNOSIS — M25562 Pain in left knee: Secondary | ICD-10-CM | POA: Diagnosis not present

## 2018-09-30 DIAGNOSIS — G894 Chronic pain syndrome: Secondary | ICD-10-CM | POA: Diagnosis not present

## 2018-09-30 DIAGNOSIS — G5693 Unspecified mononeuropathy of bilateral upper limbs: Secondary | ICD-10-CM | POA: Diagnosis not present

## 2018-09-30 DIAGNOSIS — M25561 Pain in right knee: Secondary | ICD-10-CM | POA: Diagnosis not present

## 2018-10-03 ENCOUNTER — Other Ambulatory Visit: Payer: Self-pay

## 2018-10-03 ENCOUNTER — Ambulatory Visit (INDEPENDENT_AMBULATORY_CARE_PROVIDER_SITE_OTHER): Payer: Medicare HMO | Admitting: Orthopaedic Surgery

## 2018-10-03 ENCOUNTER — Encounter (INDEPENDENT_AMBULATORY_CARE_PROVIDER_SITE_OTHER): Payer: Self-pay | Admitting: Orthopaedic Surgery

## 2018-10-03 VITALS — Ht 70.0 in | Wt 252.0 lb

## 2018-10-03 DIAGNOSIS — M160 Bilateral primary osteoarthritis of hip: Secondary | ICD-10-CM

## 2018-10-03 DIAGNOSIS — M1612 Unilateral primary osteoarthritis, left hip: Secondary | ICD-10-CM

## 2018-10-03 DIAGNOSIS — M1611 Unilateral primary osteoarthritis, right hip: Secondary | ICD-10-CM

## 2018-10-03 MED ORDER — CELECOXIB 200 MG PO CAPS: 200.0000 mg | ORAL_CAPSULE | Freq: Every day | ORAL | 3 refills | Status: DC
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
## 2018-10-10 ENCOUNTER — Encounter: Payer: Self-pay | Admitting: Specialist

## 2018-10-10 ENCOUNTER — Other Ambulatory Visit: Payer: Self-pay

## 2018-10-10 ENCOUNTER — Ambulatory Visit (INDEPENDENT_AMBULATORY_CARE_PROVIDER_SITE_OTHER): Payer: Medicare HMO | Admitting: Specialist

## 2018-10-10 VITALS — BP 131/83 | HR 61 | Ht 70.0 in | Wt 252.0 lb

## 2018-10-10 DIAGNOSIS — M48062 Spinal stenosis, lumbar region with neurogenic claudication: Secondary | ICD-10-CM | POA: Diagnosis not present

## 2018-10-10 DIAGNOSIS — M160 Bilateral primary osteoarthritis of hip: Secondary | ICD-10-CM

## 2018-10-10 DIAGNOSIS — M1612 Unilateral primary osteoarthritis, left hip: Secondary | ICD-10-CM | POA: Diagnosis not present

## 2018-10-10 DIAGNOSIS — M1611 Unilateral primary osteoarthritis, right hip: Secondary | ICD-10-CM

## 2018-10-10 NOTE — Patient Instructions (Addendum)
Avoid bending, stooping and squatting. Avoid prolong standing and walking. Avoid frequent bending and stooping  No lifting greater than 10 lbs. May use ice or moist heat for pain. Weight loss is of benefit. Handicap license is approved. Dr. Romona Curls secretary/Assistant will call to arrange for right hip steroid injection  Cane in the opposite hand from the hip side that is painful.
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
## 2018-10-17 DIAGNOSIS — H25813 Combined forms of age-related cataract, bilateral: Secondary | ICD-10-CM | POA: Diagnosis not present

## 2018-10-17 DIAGNOSIS — H524 Presbyopia: Secondary | ICD-10-CM | POA: Diagnosis not present

## 2018-10-17 DIAGNOSIS — H52203 Unspecified astigmatism, bilateral: Secondary | ICD-10-CM | POA: Diagnosis not present

## 2018-10-17 DIAGNOSIS — H5213 Myopia, bilateral: Secondary | ICD-10-CM | POA: Diagnosis not present

## 2018-10-18 DIAGNOSIS — H9222 Otorrhagia, left ear: Secondary | ICD-10-CM | POA: Diagnosis not present

## 2018-10-23 DIAGNOSIS — H60333 Swimmer's ear, bilateral: Secondary | ICD-10-CM | POA: Insufficient documentation

## 2018-10-23 DIAGNOSIS — H9222 Otorrhagia, left ear: Secondary | ICD-10-CM | POA: Diagnosis not present

## 2018-10-31 ENCOUNTER — Ambulatory Visit: Payer: Medicare HMO | Admitting: Specialist

## 2018-11-05 ENCOUNTER — Encounter: Payer: Self-pay | Admitting: Physical Medicine and Rehabilitation

## 2018-11-05 ENCOUNTER — Other Ambulatory Visit: Payer: Self-pay

## 2018-11-05 ENCOUNTER — Ambulatory Visit (INDEPENDENT_AMBULATORY_CARE_PROVIDER_SITE_OTHER): Payer: Medicare HMO | Admitting: Physical Medicine and Rehabilitation

## 2018-11-05 ENCOUNTER — Ambulatory Visit: Payer: Self-pay

## 2018-11-05 DIAGNOSIS — M25551 Pain in right hip: Secondary | ICD-10-CM

## 2018-11-05 NOTE — Progress Notes (Signed)
   Donald [name] - 63 y.o. male MRN [id number]  Date of birth: 11/06/1955  Office Visit Note: Visit Date: 11/05/2018 PCP: [name], MD Referred by: [name], MD  Subjective: Chief Complaint  Patient presents with  . Right Hip - Pain   HPI:  Donald [name] is a 63 y.o. male who comes in today At the request of Basil Dess, M.D. and Jean Rosenthal, MD for diagnostic and hopefully therapeutic anesthetic hip arthrogram on the right.  Patient is having right hip pain without much in way of groin pain.  He reports sitting and walking tends to make it worse and nothing really is helped with the pain.  He reports 8 out of 10 pain.  It started several months ago.  Does have hip osteoarthritis.  Does have a history of spinal stenosis.  ROS Otherwise per HPI.  Assessment & Plan: Visit Diagnoses:  1. Pain in right hip     Plan: Findings:  During the immediate post injection anesthetic.      Meds & Orders: No orders of the defined types were placed in this encounter.   Orders Placed This Encounter  Procedures  . Large Joint Inj: R hip joint  . XR C-ARM NO REPORT    Follow-up: Return for Basil Dess, M.D. as scheduled.   Procedures: Large Joint Inj: R hip joint on 11/05/2018 2:30 PM Indications: pain and diagnostic evaluation Details: 22 G needle, anterior approach  Arthrogram: Yes  Medications: 80 mg triamcinolone acetonide 40 MG/ML; 4 mL bupivacaine 0.25 % Outcome: tolerated well, no immediate complications  Arthrogram demonstrated excellent flow of contrast throughout the joint surface without extravasation or obvious defect.  The patient had relief of symptoms during the anesthetic phase of the injection.  Procedure, treatment alternatives, risks and benefits explained, specific risks discussed. Consent was given by the patient. Immediately prior to procedure a time out was called to verify the correct patient, procedure, equipment, support staff and site/side marked  as required. Patient was prepped and draped in the usual sterile fashion.      No notes on file   Clinical History: No specialty comments available.     Objective:  VS:  HT:    WT:   BMI:     BP:   HR: bpm  TEMP: ( )  RESP:  Physical Exam  Ortho Exam Imaging: Xr C-arm No Report  Result Date: 11/05/2018 Please see Notes tab for imaging impression.

## 2018-11-05 NOTE — Progress Notes (Signed)
 .  Numeric Pain Rating Scale and Functional Assessment Average Pain 8   In the last MONTH (on 0-10 scale) has pain interfered with the following?  1. General activity like being  able to carry out your everyday physical activities such as walking, climbing stairs, carrying groceries, or moving a chair?  Rating(9)    -Dye Allergies.  

## 2018-11-06 DIAGNOSIS — H7441 Polyp of right middle ear: Secondary | ICD-10-CM | POA: Diagnosis not present

## 2018-11-06 MED ORDER — BUPIVACAINE HCL 0.25 % IJ SOLN
4.0000 mL | INTRAMUSCULAR | Status: AC | PRN
  Administered 2018-11-05: 4 mL via INTRA_ARTICULAR

## 2018-11-06 MED ORDER — TRIAMCINOLONE ACETONIDE 40 MG/ML IJ SUSP
80.0000 mg | INTRAMUSCULAR | Status: AC | PRN
  Administered 2018-11-05: 80 mg via INTRA_ARTICULAR

## 2018-11-07 ENCOUNTER — Ambulatory Visit: Payer: Medicare HMO | Admitting: Specialist

## 2018-11-08 DIAGNOSIS — H9011 Conductive hearing loss, unilateral, right ear, with unrestricted hearing on the contralateral side: Secondary | ICD-10-CM | POA: Diagnosis not present

## 2018-11-13 ENCOUNTER — Other Ambulatory Visit: Payer: Self-pay | Admitting: Otolaryngology

## 2018-11-13 DIAGNOSIS — H7441 Polyp of right middle ear: Secondary | ICD-10-CM

## 2018-11-22 ENCOUNTER — Ambulatory Visit
Admission: RE | Admit: 2018-11-22 | Discharge: 2018-11-22 | Disposition: A | Discharge status: Home / Self Care | Payer: Medicare HMO | Source: Ambulatory Visit | Attending: Otolaryngology | Admitting: Otolaryngology

## 2018-11-22 ENCOUNTER — Other Ambulatory Visit: Payer: Self-pay

## 2018-11-22 DIAGNOSIS — H938X1 Other specified disorders of right ear: Secondary | ICD-10-CM | POA: Diagnosis not present

## 2018-11-22 DIAGNOSIS — H7441 Polyp of right middle ear: Secondary | ICD-10-CM

## 2018-11-22 MED ORDER — IOPAMIDOL (ISOVUE-300) INJECTION 61%
75.0000 mL | Freq: Once | INTRAVENOUS | Status: AC | PRN
  Administered 2018-11-22: 75 mL via INTRAVENOUS

## 2018-12-02 DIAGNOSIS — G4733 Obstructive sleep apnea (adult) (pediatric): Secondary | ICD-10-CM | POA: Diagnosis not present

## 2018-12-04 DIAGNOSIS — H7441 Polyp of right middle ear: Secondary | ICD-10-CM | POA: Diagnosis not present

## 2018-12-20 DIAGNOSIS — R21 Rash and other nonspecific skin eruption: Secondary | ICD-10-CM | POA: Diagnosis not present

## 2018-12-25 DIAGNOSIS — G894 Chronic pain syndrome: Secondary | ICD-10-CM | POA: Diagnosis not present

## 2018-12-25 DIAGNOSIS — G5693 Unspecified mononeuropathy of bilateral upper limbs: Secondary | ICD-10-CM | POA: Diagnosis not present

## 2018-12-25 DIAGNOSIS — M25561 Pain in right knee: Secondary | ICD-10-CM | POA: Diagnosis not present

## 2018-12-25 DIAGNOSIS — M545 Low back pain: Secondary | ICD-10-CM | POA: Diagnosis not present

## 2018-12-25 DIAGNOSIS — M25562 Pain in left knee: Secondary | ICD-10-CM | POA: Diagnosis not present

## 2019-01-04 DIAGNOSIS — H52223 Regular astigmatism, bilateral: Secondary | ICD-10-CM | POA: Diagnosis not present

## 2019-01-04 DIAGNOSIS — H524 Presbyopia: Secondary | ICD-10-CM | POA: Diagnosis not present

## 2019-01-22 DIAGNOSIS — M25561 Pain in right knee: Secondary | ICD-10-CM | POA: Diagnosis not present

## 2019-01-22 DIAGNOSIS — G5693 Unspecified mononeuropathy of bilateral upper limbs: Secondary | ICD-10-CM | POA: Diagnosis not present

## 2019-01-22 DIAGNOSIS — M25562 Pain in left knee: Secondary | ICD-10-CM | POA: Diagnosis not present

## 2019-01-22 DIAGNOSIS — G894 Chronic pain syndrome: Secondary | ICD-10-CM | POA: Diagnosis not present

## 2019-01-22 DIAGNOSIS — M545 Low back pain: Secondary | ICD-10-CM | POA: Diagnosis not present

## 2019-02-06 ENCOUNTER — Ambulatory Visit (INDEPENDENT_AMBULATORY_CARE_PROVIDER_SITE_OTHER): Payer: Medicare HMO | Admitting: Neurology

## 2019-02-06 ENCOUNTER — Encounter: Payer: Self-pay | Admitting: Neurology

## 2019-02-06 ENCOUNTER — Other Ambulatory Visit: Payer: Self-pay

## 2019-02-06 VITALS — BP 136/86 | HR 73 | Temp 97.8°F | Ht 70.0 in | Wt 241.0 lb

## 2019-02-06 DIAGNOSIS — G4737 Central sleep apnea in conditions classified elsewhere: Secondary | ICD-10-CM

## 2019-02-06 DIAGNOSIS — Z7189 Other specified counseling: Secondary | ICD-10-CM | POA: Insufficient documentation

## 2019-02-06 DIAGNOSIS — G4731 Primary central sleep apnea: Secondary | ICD-10-CM | POA: Insufficient documentation

## 2019-02-06 DIAGNOSIS — E669 Obesity, unspecified: Secondary | ICD-10-CM | POA: Diagnosis not present

## 2019-02-06 DIAGNOSIS — J0140 Acute pansinusitis, unspecified: Secondary | ICD-10-CM | POA: Insufficient documentation

## 2019-02-06 DIAGNOSIS — F1190 Opioid use, unspecified, uncomplicated: Secondary | ICD-10-CM

## 2019-02-06 NOTE — Patient Instructions (Addendum)
Consider medical weight and wellness visit with Dr . Dennard Nip to establish metabolic rate.   Safe Surgery and Sleep Apnea Sleep apnea is a condition in which breathing pauses or becomes shallow during sleep. Most people with the condition are not aware that they have it. It is important for your health care providers to know whether or not you have sleep apnea, especially if you are having surgery. Sleep apnea can increase your risk of complications during and after surgery. What is sleep apnea screening? Sleep apnea screening is a test to determine if you are at risk for sleep apnea. Before you have surgery, get screened for sleep apnea and talk with your surgeon and primary health care provider about your results. Screening usually involves answering a list of questions about your sleep quality. Ask your health care provider if you can be screened, or take a screening test yourself. You can find these tests online at the American Sleep Apnea Association website. Some questions you may be asked include:  Do you snore?  Is your sleep restless?  Do you have daytime sleepiness?  Has a partner or spouse told you that you stop breathing during sleep?  Have you had trouble concentrating or memory loss? Answer these questions honestly. If a screening test is positive, this means you are at risk for the condition. Further testing may be needed to confirm a diagnosis of sleep apnea. Why does sleep apnea increase the risk for complications? Untreated sleep apnea increases the risk for certain complications during and after surgery. This is because when you have sleep apnea, your airways are more sensitive to medicines used during surgery. The airways can collapse and block the flow of air.  Having untreated sleep apnea can increase your risk for:  A longer stay in the recovery room or hospital.  Breathing difficulties such as low oxygen levels after surgery.  Increased pain after surgery.   Irregular heart rhythms.  Stroke.  Heart attack. You and your health care provider can take steps to help prevent these and other complications. What should I do if I have sleep apnea?  Before surgery  Tell your health care provider and anesthesia specialist that you have sleep apnea. Discuss your individual risks based on your screening results, the type of surgery you will be having, and other medical conditions that you have.  If you have a sleep apnea device (positive airway pressure device), wear it as prescribed. If you have not been wearing your device, talk with your health care provider about why you have not been wearing it. There are ways to improve your use of the device, such as: ? Adjusting the mask. ? Adding humidified air. ? Getting treatment for nasal congestion.  Do not use any products that contain nicotine or tobacco, such as cigarettes and e-cigarettes. If you need help quitting, ask your health care provider. On the day of surgery  If instructed by your health care provider, bring your sleep apnea device with you.  Wear your sleep apnea device when you are sleeping during your hospital stay, or as told by your health care provider.  Ask your health care provider what special considerations will be taken during and after your surgery. After surgery  You may need to be given extra oxygen and wear a continuous oxygen monitor (pulse oximetry).  For your safety, you may need to stay in the recovery room or hospital for longer than is normal.  Follow instructions from your health care provider  about wearing your sleep device: ? Anytime you are sleeping, including during daytime naps. ? While taking prescription pain medicines, sleeping medicines, or medicines that make you drowsy.  If your health care provider approves, raise the head of your bed or lie on your side. Do not lie flat on your back.  Follow instructions from your health care provider about medicines:  ? Avoid using sleep medicines unless they are prescribed by a health care provider who is aware of the results of your sleep apnea screening. ? Avoid using sleep medicines while taking opioid pain medicine. ? Limit your use of opioid pain medicines as much as possible. Ask your health care provider what is a safe amount to use. ? Ask about using pain medicines that do not affect your breathing, such as NSAIDs or acetaminophen. Where to find more information For more information about sleep apnea screening and healthy sleep, visit these websites:  Centers for Disease Control and Prevention: LearningD[employer]at[employer].pl  American Sleep Apnea Association: www.sleepapnea.org Contact a health care provider if:  You have sleep apnea or think you may be at risk for sleep apnea, and you are scheduled for surgery. Get help right away if:  You have trouble breathing.  You are very drowsy and cannot stay awake.  You are told that you have pauses in your breathing during sleep after surgery.  You have chest pain.  You have a fast heartbeat. Summary  It is important for your health care providers to know whether or not you have sleep apnea, especially if you are having surgery.  If you have sleep apnea, you are at an increased risk for complications during surgery.  You and your health care provider can take precautions to help prevent complications. If you have sleep apnea, make sure to tell your health care provider and anesthesia specialist. This information is not intended to replace advice given to you by your health care provider. Make sure you discuss any questions you have with your health care provider. Document Released: 09/07/2016 Document Revised: 09/13/2018 Document Reviewed: 09/07/2016 Elsevier Patient Education  [place].
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
## 2019-02-20 DIAGNOSIS — H7441 Polyp of right middle ear: Secondary | ICD-10-CM | POA: Diagnosis not present

## 2019-02-20 DIAGNOSIS — M26621 Arthralgia of right temporomandibular joint: Secondary | ICD-10-CM | POA: Diagnosis not present

## 2019-03-04 DIAGNOSIS — G4733 Obstructive sleep apnea (adult) (pediatric): Secondary | ICD-10-CM | POA: Diagnosis not present

## 2019-03-19 DIAGNOSIS — M25562 Pain in left knee: Secondary | ICD-10-CM | POA: Diagnosis not present

## 2019-03-19 DIAGNOSIS — M545 Low back pain: Secondary | ICD-10-CM | POA: Diagnosis not present

## 2019-03-19 DIAGNOSIS — Z7952 Long term (current) use of systemic steroids: Secondary | ICD-10-CM | POA: Diagnosis not present

## 2019-03-19 DIAGNOSIS — G894 Chronic pain syndrome: Secondary | ICD-10-CM | POA: Diagnosis not present

## 2019-03-19 DIAGNOSIS — M25561 Pain in right knee: Secondary | ICD-10-CM | POA: Diagnosis not present

## 2019-03-19 DIAGNOSIS — G5693 Unspecified mononeuropathy of bilateral upper limbs: Secondary | ICD-10-CM | POA: Diagnosis not present

## 2019-04-30 ENCOUNTER — Other Ambulatory Visit: Payer: Self-pay

## 2019-04-30 DIAGNOSIS — Z20822 Contact with and (suspected) exposure to covid-19: Secondary | ICD-10-CM

## 2019-05-03 LAB — NOVEL CORONAVIRUS, NAA: SARS-CoV-2, NAA: DETECTED — AB

## 2019-05-04 ENCOUNTER — Telehealth: Payer: Self-pay | Admitting: Unknown Physician Specialty

## 2019-05-04 NOTE — Telephone Encounter (Signed)
Called to discuss with patient about Covid symptoms and the use of bamlanivimab, a monoclonal antibody infusion for those with mild to moderate Covid symptoms and at a high risk of hospitalization.    If he has Covid symptoms, pt is qualified for this infusion at the [facility] infusion center due to co-morbid conditions and/or a  member of an at-risk group.

## 2019-05-04 NOTE — Telephone Encounter (Signed)
Left message

## 2019-05-05 ENCOUNTER — Telehealth: Payer: Self-pay | Admitting: Unknown Physician Specialty

## 2019-05-05 NOTE — Telephone Encounter (Signed)
Called back about monoclonal antibody infusion.  Patient is asymptomatic at this time and does not qualify.

## 2019-05-06 ENCOUNTER — Telehealth: Payer: Self-pay | Admitting: Neurology

## 2019-05-06 ENCOUNTER — Other Ambulatory Visit: Payer: Self-pay | Admitting: Neurology

## 2019-05-06 ENCOUNTER — Telehealth: Payer: Self-pay | Admitting: Unknown Physician Specialty

## 2019-05-06 ENCOUNTER — Other Ambulatory Visit: Payer: Self-pay | Admitting: Unknown Physician Specialty

## 2019-05-06 DIAGNOSIS — E669 Obesity, unspecified: Secondary | ICD-10-CM

## 2019-05-06 DIAGNOSIS — U071 COVID-19: Secondary | ICD-10-CM

## 2019-05-06 NOTE — Telephone Encounter (Signed)
Per previous office visit it did not appear that the referral was placed to healthy weight and wellness as it seemed that it was more suggested. I have entered the referral in and I will make sure the referral team knows that patient would like to move forward.

## 2019-05-06 NOTE — Telephone Encounter (Signed)
Scheduled for 12/2 @ 8:30 am

## 2019-05-06 NOTE — Progress Notes (Signed)
DM and Htn addressed.  Under control and monitored by PCP

## 2019-05-06 NOTE — Telephone Encounter (Signed)
Pt called back to talk about symptoms. He is having some SOB and sinus symptoms.    Will forward name to Jena Gauss RN.

## 2019-05-06 NOTE — Telephone Encounter (Signed)
Pt called wanting to know the update on the referral that was mentioned to him on seeing someone about his body mass. Please advise.

## 2019-05-07 ENCOUNTER — Ambulatory Visit (HOSPITAL_COMMUNITY)
Admission: RE | Admit: 2019-05-07 | Discharge: 2019-05-07 | Disposition: A | Discharge status: Home / Self Care | Payer: HRSA Program | Source: Ambulatory Visit | Attending: Critical Care Medicine | Admitting: Critical Care Medicine

## 2019-05-07 DIAGNOSIS — U071 COVID-19: Secondary | ICD-10-CM | POA: Diagnosis not present

## 2019-05-07 DIAGNOSIS — E119 Type 2 diabetes mellitus without complications: Secondary | ICD-10-CM | POA: Diagnosis not present

## 2019-05-07 MED ORDER — DIPHENHYDRAMINE HCL 50 MG/ML IJ SOLN: 50.0000 mg | Freq: Once | INTRAMUSCULAR | Status: DC | PRN

## 2019-05-07 MED ORDER — FAMOTIDINE IN NACL 20-0.9 MG/50ML-% IV SOLN: 20.0000 mg | Freq: Once | INTRAVENOUS | Status: DC | PRN

## 2019-05-07 MED ORDER — SODIUM CHLORIDE 0.9 % IV SOLN
700.0000 mg | Freq: Once | INTRAVENOUS | Status: AC
  Administered 2019-05-07: 700 mg via INTRAVENOUS
  Filled 2019-05-07: qty 20

## 2019-05-07 MED ORDER — EPINEPHRINE 0.3 MG/0.3ML IJ SOAJ: 0.3000 mg | Freq: Once | INTRAMUSCULAR | Status: DC | PRN

## 2019-05-07 MED ORDER — SODIUM CHLORIDE 0.9 % IV SOLN
INTRAVENOUS | Status: DC | PRN
  Administered 2019-05-07: 1000 mL via INTRAVENOUS

## 2019-05-07 MED ORDER — METHYLPREDNISOLONE SODIUM SUCC 125 MG IJ SOLR: 125.0000 mg | Freq: Once | INTRAMUSCULAR | Status: DC | PRN

## 2019-05-07 MED ORDER — ALBUTEROL SULFATE HFA 108 (90 BASE) MCG/ACT IN AERS: 2.0000 | INHALATION_SPRAY | Freq: Once | RESPIRATORY_TRACT | Status: DC | PRN

## 2019-05-07 NOTE — Progress Notes (Signed)
  Diagnosis: COVID-19  Physician:  Procedure: bamlanivimab infusion Provided patient with bamlanivimab fact sheet for patients, parents and caregivers prior to infusion.  Complications: No immediate complications noted.  Discharge: Discharged home   Aakash Hollomon N Dorann Davidson 05/07/2019   

## 2019-05-07 NOTE — Telephone Encounter (Signed)
Noted referral sent and I checked [facility] has sent patient an Email

## 2019-11-04 DIAGNOSIS — M26621 Arthralgia of right temporomandibular joint: Secondary | ICD-10-CM | POA: Diagnosis not present

## 2019-12-02 DIAGNOSIS — H52203 Unspecified astigmatism, bilateral: Secondary | ICD-10-CM | POA: Diagnosis not present

## 2019-12-02 DIAGNOSIS — H524 Presbyopia: Secondary | ICD-10-CM | POA: Diagnosis not present

## 2019-12-02 DIAGNOSIS — E1136 Type 2 diabetes mellitus with diabetic cataract: Secondary | ICD-10-CM | POA: Diagnosis not present

## 2019-12-02 DIAGNOSIS — H5213 Myopia, bilateral: Secondary | ICD-10-CM | POA: Diagnosis not present

## 2019-12-02 DIAGNOSIS — H25813 Combined forms of age-related cataract, bilateral: Secondary | ICD-10-CM | POA: Diagnosis not present

## 2019-12-10 LAB — HM DIABETES EYE EXAM

## 2020-01-30 DIAGNOSIS — E119 Type 2 diabetes mellitus without complications: Secondary | ICD-10-CM | POA: Diagnosis not present

## 2020-01-30 DIAGNOSIS — M160 Bilateral primary osteoarthritis of hip: Secondary | ICD-10-CM | POA: Diagnosis not present

## 2020-01-30 DIAGNOSIS — M25551 Pain in right hip: Secondary | ICD-10-CM | POA: Diagnosis not present

## 2020-01-30 DIAGNOSIS — M25561 Pain in right knee: Secondary | ICD-10-CM | POA: Diagnosis not present

## 2020-02-11 ENCOUNTER — Ambulatory Visit: Payer: Medicare HMO | Admitting: Family Medicine

## 2020-02-20 DIAGNOSIS — M961 Postlaminectomy syndrome, not elsewhere classified: Secondary | ICD-10-CM | POA: Diagnosis not present

## 2020-02-20 DIAGNOSIS — M47816 Spondylosis without myelopathy or radiculopathy, lumbar region: Secondary | ICD-10-CM | POA: Diagnosis not present

## 2020-02-20 DIAGNOSIS — M7061 Trochanteric bursitis, right hip: Secondary | ICD-10-CM | POA: Diagnosis not present

## 2020-02-20 DIAGNOSIS — M1611 Unilateral primary osteoarthritis, right hip: Secondary | ICD-10-CM | POA: Diagnosis not present

## 2020-02-20 DIAGNOSIS — G894 Chronic pain syndrome: Secondary | ICD-10-CM | POA: Diagnosis not present

## 2020-02-20 DIAGNOSIS — M5416 Radiculopathy, lumbar region: Secondary | ICD-10-CM | POA: Diagnosis not present

## 2020-02-25 DIAGNOSIS — M1611 Unilateral primary osteoarthritis, right hip: Secondary | ICD-10-CM | POA: Diagnosis not present

## 2020-02-25 DIAGNOSIS — G8929 Other chronic pain: Secondary | ICD-10-CM | POA: Diagnosis not present

## 2020-03-09 DIAGNOSIS — M545 Low back pain, unspecified: Secondary | ICD-10-CM | POA: Diagnosis not present

## 2020-03-10 DIAGNOSIS — G8929 Other chronic pain: Secondary | ICD-10-CM | POA: Diagnosis not present

## 2020-03-10 DIAGNOSIS — M1611 Unilateral primary osteoarthritis, right hip: Secondary | ICD-10-CM | POA: Diagnosis not present

## 2020-03-28 DIAGNOSIS — M961 Postlaminectomy syndrome, not elsewhere classified: Secondary | ICD-10-CM | POA: Diagnosis not present

## 2020-03-28 DIAGNOSIS — M5416 Radiculopathy, lumbar region: Secondary | ICD-10-CM | POA: Diagnosis not present

## 2020-03-28 DIAGNOSIS — M47816 Spondylosis without myelopathy or radiculopathy, lumbar region: Secondary | ICD-10-CM | POA: Diagnosis not present

## 2020-03-29 DIAGNOSIS — G8929 Other chronic pain: Secondary | ICD-10-CM | POA: Diagnosis not present

## 2020-03-29 DIAGNOSIS — M25551 Pain in right hip: Secondary | ICD-10-CM | POA: Diagnosis not present

## 2020-03-29 DIAGNOSIS — M1611 Unilateral primary osteoarthritis, right hip: Secondary | ICD-10-CM | POA: Diagnosis not present

## 2020-03-29 DIAGNOSIS — G894 Chronic pain syndrome: Secondary | ICD-10-CM | POA: Diagnosis not present

## 2020-04-19 DIAGNOSIS — H524 Presbyopia: Secondary | ICD-10-CM | POA: Diagnosis not present

## 2020-04-19 DIAGNOSIS — Z0100 Encounter for examination of eyes and vision without abnormal findings: Secondary | ICD-10-CM | POA: Diagnosis not present

## 2020-05-04 DIAGNOSIS — M961 Postlaminectomy syndrome, not elsewhere classified: Secondary | ICD-10-CM | POA: Diagnosis not present

## 2020-05-04 DIAGNOSIS — M1731 Unilateral post-traumatic osteoarthritis, right knee: Secondary | ICD-10-CM | POA: Diagnosis not present

## 2020-05-04 DIAGNOSIS — G894 Chronic pain syndrome: Secondary | ICD-10-CM | POA: Diagnosis not present

## 2020-07-07 DIAGNOSIS — G4733 Obstructive sleep apnea (adult) (pediatric): Secondary | ICD-10-CM | POA: Diagnosis not present

## 2020-08-17 DIAGNOSIS — H00025 Hordeolum internum left lower eyelid: Secondary | ICD-10-CM | POA: Diagnosis not present

## 2020-09-17 DIAGNOSIS — H52223 Regular astigmatism, bilateral: Secondary | ICD-10-CM | POA: Diagnosis not present

## 2020-09-17 DIAGNOSIS — H524 Presbyopia: Secondary | ICD-10-CM | POA: Diagnosis not present

## 2020-09-30 ENCOUNTER — Ambulatory Visit (INDEPENDENT_AMBULATORY_CARE_PROVIDER_SITE_OTHER): Payer: Medicare HMO | Admitting: Internal Medicine

## 2020-09-30 ENCOUNTER — Encounter (INDEPENDENT_AMBULATORY_CARE_PROVIDER_SITE_OTHER): Payer: Self-pay

## 2020-09-30 ENCOUNTER — Encounter: Payer: Self-pay | Admitting: Internal Medicine

## 2020-09-30 ENCOUNTER — Other Ambulatory Visit: Payer: Self-pay

## 2020-09-30 VITALS — BP 156/92 | HR 72 | Temp 98.3°F | Resp 16 | Ht 70.0 in | Wt 258.0 lb

## 2020-09-30 DIAGNOSIS — R350 Frequency of micturition: Secondary | ICD-10-CM

## 2020-09-30 DIAGNOSIS — G8929 Other chronic pain: Secondary | ICD-10-CM | POA: Diagnosis not present

## 2020-09-30 DIAGNOSIS — M25561 Pain in right knee: Secondary | ICD-10-CM | POA: Diagnosis not present

## 2020-09-30 DIAGNOSIS — E119 Type 2 diabetes mellitus without complications: Secondary | ICD-10-CM

## 2020-09-30 DIAGNOSIS — I10 Essential (primary) hypertension: Secondary | ICD-10-CM | POA: Insufficient documentation

## 2020-09-30 DIAGNOSIS — D539 Nutritional anemia, unspecified: Secondary | ICD-10-CM | POA: Insufficient documentation

## 2020-09-30 DIAGNOSIS — N401 Enlarged prostate with lower urinary tract symptoms: Secondary | ICD-10-CM

## 2020-09-30 DIAGNOSIS — I119 Hypertensive heart disease without heart failure: Secondary | ICD-10-CM | POA: Diagnosis not present

## 2020-09-30 DIAGNOSIS — Z0001 Encounter for general adult medical examination with abnormal findings: Secondary | ICD-10-CM | POA: Diagnosis not present

## 2020-09-30 DIAGNOSIS — K219 Gastro-esophageal reflux disease without esophagitis: Secondary | ICD-10-CM

## 2020-09-30 DIAGNOSIS — E785 Hyperlipidemia, unspecified: Secondary | ICD-10-CM | POA: Diagnosis not present

## 2020-09-30 DIAGNOSIS — M1611 Unilateral primary osteoarthritis, right hip: Secondary | ICD-10-CM | POA: Diagnosis not present

## 2020-09-30 LAB — URINALYSIS, ROUTINE W REFLEX MICROSCOPIC
Bilirubin Urine: NEGATIVE
Ketones, ur: NEGATIVE
Leukocytes,Ua: NEGATIVE
Nitrite: NEGATIVE
Specific Gravity, Urine: >=1.03 — AB (ref 1.000–1.030)
Total Protein, Urine: NEGATIVE
Urine Glucose: NEGATIVE
Urobilinogen, UA: 0.2 (ref 0.0–1.0)
pH: 5 (ref 5.0–8.0)

## 2020-09-30 LAB — CBC WITH DIFFERENTIAL/PLATELET
Basophils Absolute: 0 10*3/uL (ref 0.0–0.1)
Basophils Relative: 0.2 % (ref 0.0–3.0)
Eosinophils Absolute: 0 10*3/uL (ref 0.0–0.7)
Eosinophils Relative: 0.4 % (ref 0.0–5.0)
HCT: 42.1 % (ref 39.0–52.0)
Hemoglobin: 13.8 g/dL (ref 13.0–17.0)
Lymphocytes Relative: 29 % (ref 12.0–46.0)
Lymphs Abs: 2.3 10*3/uL (ref 0.7–4.0)
MCHC: 32.8 g/dL (ref 30.0–36.0)
MCV: 90.4 fl (ref 78.0–100.0)
Monocytes Absolute: 0.5 10*3/uL (ref 0.1–1.0)
Monocytes Relative: 6.8 % (ref 3.0–12.0)
Neutro Abs: 5.1 10*3/uL (ref 1.4–7.7)
Neutrophils Relative %: 63.6 % (ref 43.0–77.0)
Platelets: 185 10*3/uL (ref 150.0–400.0)
RBC: 4.66 Mil/uL (ref 4.22–5.81)
RDW: 13.1 % (ref 11.5–15.5)
WBC: 8.1 10*3/uL (ref 4.0–10.5)

## 2020-09-30 LAB — MICROALBUMIN / CREATININE URINE RATIO
Creatinine,U: 161.2 mg/dL
Microalb Creat Ratio: 0.4 mg/g (ref 0.0–30.0)
Microalb, Ur: <0.7 mg/dL (ref 0.0–1.9)

## 2020-09-30 LAB — BASIC METABOLIC PANEL
BUN: 18 mg/dL (ref 6–23)
CO2: 32 mEq/L (ref 19–32)
Calcium: 9.6 mg/dL (ref 8.4–10.5)
Chloride: 104 mEq/L (ref 96–112)
Creatinine, Ser: 0.96 mg/dL (ref 0.40–1.50)
GFR: 82.63 mL/min (ref >60.00)
Glucose, Bld: 116 mg/dL — ABNORMAL HIGH (ref 70–99)
Potassium: 4.2 mEq/L (ref 3.5–5.1)
Sodium: 141 mEq/L (ref 135–145)

## 2020-09-30 LAB — HEPATIC FUNCTION PANEL
ALT: 21 U/L (ref 0–53)
AST: 20 U/L (ref 0–37)
Albumin: 4.3 g/dL (ref 3.5–5.2)
Alkaline Phosphatase: 45 U/L (ref 39–117)
Bilirubin, Direct: 0 mg/dL (ref 0.0–0.3)
Total Bilirubin: 0.3 mg/dL (ref 0.2–1.2)
Total Protein: 8 g/dL (ref 6.0–8.3)

## 2020-09-30 LAB — RETICULOCYTES
ABS Retic: 61100 cells/uL (ref 25000–90000)
Retic Ct Pct: 1.3 %

## 2020-09-30 LAB — LIPID PANEL
Cholesterol: 168 mg/dL (ref 0–200)
HDL: 55.7 mg/dL (ref >39.00)
LDL Cholesterol: 98 mg/dL (ref 0–99)
NonHDL: 112.76
Total CHOL/HDL Ratio: 3
Triglycerides: 72 mg/dL (ref 0.0–149.0)
VLDL: 14.4 mg/dL (ref 0.0–40.0)

## 2020-09-30 LAB — FERRITIN: Ferritin: 64.6 ng/mL (ref 22.0–322.0)

## 2020-09-30 LAB — IRON: Iron: 82 ug/dL (ref 42–165)

## 2020-09-30 LAB — FOLATE: Folate: >23.6 ng/mL (ref >5.9)

## 2020-09-30 LAB — TSH: TSH: 1.02 u[IU]/mL (ref 0.35–4.50)

## 2020-09-30 LAB — POCT GLYCOSYLATED HEMOGLOBIN (HGB A1C): Hemoglobin A1C: 6.4 % — AB (ref 4.0–5.6)

## 2020-09-30 LAB — PSA: PSA: 0.59 ng/mL (ref 0.10–4.00)

## 2020-09-30 LAB — VITAMIN B12: Vitamin B-12: >1506 pg/mL — ABNORMAL HIGH (ref 211–911)
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
## 2020-09-30 NOTE — Patient Instructions (Signed)

## 2020-10-01 ENCOUNTER — Encounter: Payer: Self-pay | Admitting: Internal Medicine

## 2020-10-01 MED ORDER — ROSUVASTATIN CALCIUM 10 MG PO TABS: 10.0000 mg | ORAL_TABLET | Freq: Every day | ORAL | 1 refills | Status: DC

## 2020-10-01 MED ORDER — INDAPAMIDE 1.25 MG PO TABS: 1.2500 mg | ORAL_TABLET | Freq: Every day | ORAL | 0 refills | Status: DC

## 2020-10-01 MED ORDER — NEBIVOLOL HCL 5 MG PO TABS: 5.0000 mg | ORAL_TABLET | Freq: Every day | ORAL | 0 refills | Status: DC

## 2020-10-07 DIAGNOSIS — G4733 Obstructive sleep apnea (adult) (pediatric): Secondary | ICD-10-CM | POA: Diagnosis not present

## 2020-10-09 LAB — ALDOSTERONE + RENIN ACTIVITY W/ RATIO
ALDO / PRA Ratio: 7.4 Ratio (ref 0.9–28.9)
Aldosterone: 7 ng/dL
Renin Activity: 0.95 ng/mL/h (ref 0.25–5.82)

## 2020-10-11 ENCOUNTER — Ambulatory Visit: Payer: Self-pay

## 2020-10-11 ENCOUNTER — Ambulatory Visit: Payer: Medicare HMO | Admitting: Orthopaedic Surgery

## 2020-10-11 VITALS — Ht 70.0 in | Wt 258.0 lb

## 2020-10-11 DIAGNOSIS — M25551 Pain in right hip: Secondary | ICD-10-CM

## 2020-10-11 DIAGNOSIS — M1611 Unilateral primary osteoarthritis, right hip: Secondary | ICD-10-CM
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
## 2020-10-14 DIAGNOSIS — B182 Chronic viral hepatitis C: Secondary | ICD-10-CM | POA: Diagnosis not present

## 2020-10-14 DIAGNOSIS — N401 Enlarged prostate with lower urinary tract symptoms: Secondary | ICD-10-CM | POA: Diagnosis not present

## 2020-10-14 DIAGNOSIS — N3281 Overactive bladder: Secondary | ICD-10-CM | POA: Diagnosis not present

## 2020-10-14 DIAGNOSIS — N138 Other obstructive and reflux uropathy: Secondary | ICD-10-CM | POA: Diagnosis not present

## 2020-10-14 DIAGNOSIS — N3941 Urge incontinence: Secondary | ICD-10-CM | POA: Diagnosis not present

## 2020-10-15 DIAGNOSIS — Z79899 Other long term (current) drug therapy: Secondary | ICD-10-CM | POA: Diagnosis not present

## 2020-10-15 DIAGNOSIS — M961 Postlaminectomy syndrome, not elsewhere classified: Secondary | ICD-10-CM | POA: Diagnosis not present

## 2020-10-15 DIAGNOSIS — G894 Chronic pain syndrome: Secondary | ICD-10-CM | POA: Diagnosis not present

## 2020-10-15 DIAGNOSIS — M1611 Unilateral primary osteoarthritis, right hip: Secondary | ICD-10-CM | POA: Diagnosis not present

## 2020-10-25 DIAGNOSIS — N138 Other obstructive and reflux uropathy: Secondary | ICD-10-CM | POA: Diagnosis not present

## 2020-10-25 DIAGNOSIS — N401 Enlarged prostate with lower urinary tract symptoms: Secondary | ICD-10-CM | POA: Diagnosis not present

## 2020-10-25 DIAGNOSIS — N3281 Overactive bladder: Secondary | ICD-10-CM | POA: Diagnosis not present

## 2020-10-27 ENCOUNTER — Encounter: Payer: Self-pay | Admitting: Internal Medicine

## 2020-10-27 ENCOUNTER — Ambulatory Visit (INDEPENDENT_AMBULATORY_CARE_PROVIDER_SITE_OTHER): Payer: Medicare HMO | Admitting: Internal Medicine

## 2020-10-27 ENCOUNTER — Other Ambulatory Visit: Payer: Self-pay

## 2020-10-27 VITALS — BP 132/86 | HR 72 | Temp 98.1°F | Resp 16 | Ht 70.0 in | Wt 256.0 lb

## 2020-10-27 DIAGNOSIS — E119 Type 2 diabetes mellitus without complications: Secondary | ICD-10-CM | POA: Diagnosis not present

## 2020-10-27 DIAGNOSIS — I119 Hypertensive heart disease without heart failure: Secondary | ICD-10-CM

## 2020-10-27 DIAGNOSIS — I10 Essential (primary) hypertension: Secondary | ICD-10-CM

## 2020-10-27 NOTE — Progress Notes (Signed)
Subjective:  Patient ID: Donald [name], male    DOB: 1956-04-01  Age: 65 y.o. MRN: [id number]  CC: Hypertension and Diabetes  This visit occurred during the SARS-CoV-2 public health emergency.  Safety protocols were in place, including screening questions prior to the visit, additional usage of staff PPE, and extensive cleaning of exam room while observing appropriate contact time as indicated for disinfecting solutions.    HPI SPARSH CALLENS presents for f/up -   He is concerned that his recent EKG showed minimal LVH.  He wants to see a cardiologist.  He is active and denies any recent episodes of chest pain, shortness of breath, edema, diaphoresis, or fatigue.  Outpatient Medications Prior to Visit  Medication Sig Dispense Refill  . aspirin 81 MG tablet Take 81 mg by mouth daily.    . buprenorphine-naloxone (SUBOXONE) 8-2 mg SUBL SL tablet Place 1 tablet under the tongue daily.    . Cholecalciferol (D3 VITAMIN PO) Take by mouth. Plus Vitamin K liquid daily    . diclofenac sodium (VOLTAREN) 1 % GEL Apply 2 g topically every 6 (six) hours as needed (MUSCLE PAIN).    . indapamide (LOZOL) 1.25 MG tablet Take 1 tablet (1.25 mg total) by mouth daily. 90 tablet 0  . loratadine (CLARITIN) 10 MG tablet Take 1 tablet by mouth daily.    . Multiple Vitamin (MULTIVITAMIN) tablet Take by mouth.    . nebivolol (BYSTOLIC) 5 MG tablet Take 1 tablet (5 mg total) by mouth daily. 90 tablet 0  . NON FORMULARY Vitamin A,D,K daily    . omeprazole (PRILOSEC) 40 MG capsule TAKE 1 TABLET BY MOUTH DAILY  1  . predniSONE (DELTASONE) 10 MG tablet 10 mg daily.    . Pyridoxine HCl (B-6 PO) Take by mouth.    . rosuvastatin (CRESTOR) 10 MG tablet Take 1 tablet (10 mg total) by mouth daily. 90 tablet 1   Facility-Administered Medications Prior to Visit  Medication Dose Route Frequency Provider Last Rate Last Admin  . lactated ringers infusion   Intravenous Continuous Rometta Emery, PA-C   New Bag at 10/06/11  1151    ROS Review of Systems  Constitutional: Negative for diaphoresis, fatigue and unexpected weight change.  HENT: Negative.   Eyes: Negative for visual disturbance.  Respiratory: Negative for cough, chest tightness and shortness of breath.   Cardiovascular: Negative for chest pain, palpitations and leg swelling.  Gastrointestinal: Negative for abdominal pain, diarrhea, nausea and vomiting.  Endocrine: Negative.   Genitourinary: Negative.   Musculoskeletal: Positive for arthralgias. Negative for myalgias.  Skin: Negative.   Neurological: Negative.  Negative for dizziness, weakness and light-headedness.  Hematological: Negative for adenopathy. Does not bruise/bleed easily.  Psychiatric/Behavioral: Negative.     Objective:  BP 132/86 (BP Location: Left Arm, Patient Position: Sitting, Cuff Size: Large)   Pulse 72   Temp 98.1 F (36.7 C) (Oral)   Resp 16   Ht 5\' 10"  (1.778 m)   Wt 256 lb (116.1 kg)   SpO2 97%   BMI 36.73 kg/m   BP Readings from Last 3 Encounters:  10/27/20 132/86  09/30/20 (!) 156/92  05/07/19 (!) 158/88    Wt Readings from Last 3 Encounters:  10/27/20 256 lb (116.1 kg)  10/11/20 258 lb (117 kg)  09/30/20 258 lb (117 kg)    Physical Exam Vitals reviewed.  HENT:     Mouth/Throat:     Mouth: Mucous membranes are moist.  Eyes:     General:  No scleral icterus.    Conjunctiva/sclera: Conjunctivae normal.  Cardiovascular:     Rate and Rhythm: Normal rate and regular rhythm.     Heart sounds: No murmur heard.   Pulmonary:     Effort: Pulmonary effort is normal.     Breath sounds: No stridor. No wheezing, rhonchi or rales.  Abdominal:     General: Abdomen is protuberant. Bowel sounds are normal. There is no distension.     Palpations: Abdomen is soft. There is no hepatomegaly, splenomegaly or mass.  Musculoskeletal:        General: Normal range of motion.     Cervical back: Neck supple.     Right lower leg: No edema.     Left lower leg: No  edema.  Lymphadenopathy:     Cervical: No cervical adenopathy.  Skin:    General: Skin is warm and dry.  Neurological:     General: No focal deficit present.     Mental Status: He is alert.     Lab Results  Component Value Date   WBC 8.1 09/30/2020   HGB 13.8 09/30/2020   HCT 42.1 09/30/2020   PLT 185.0 09/30/2020   GLUCOSE 116 (H) 09/30/2020   CHOL 168 09/30/2020   TRIG 72.0 09/30/2020   HDL 55.70 09/30/2020   LDLCALC 98 09/30/2020   ALT 21 09/30/2020   AST 20 09/30/2020   NA 141 09/30/2020   K 4.2 09/30/2020   CL 104 09/30/2020   CREATININE 0.96 09/30/2020   BUN 18 09/30/2020   CO2 32 09/30/2020   TSH 1.02 09/30/2020   PSA 0.59 09/30/2020   INR 1.00 10/08/2009   HGBA1C 6.4 (A) 09/30/2020   MICROALBUR <0.7 09/30/2020    No results found.  Assessment & Plan:   Shandell was seen today for hypertension and diabetes.  Diagnoses and all orders for this visit:  Primary hypertension- His blood pressure is adequately well controlled.  Type 2 diabetes mellitus without complication, without long-term current use of insulin ([place])- His blood sugar is adequately well controlled.  LVH (left ventricular hypertrophy) due to hypertensive disease, without heart failure -     Ambulatory referral to Cardiology   I am having Lourena Simmonds maintain his buprenorphine-naloxone, aspirin, multivitamin, omeprazole, diclofenac sodium, predniSONE, loratadine, Pyridoxine HCl (B-6 PO), NON FORMULARY, Cholecalciferol (D3 VITAMIN PO), indapamide, nebivolol, and rosuvastatin.  No orders of the defined types were placed in this encounter.    Follow-up: No follow-ups on file.  Scarlette Calico, MD

## 2020-10-28 ENCOUNTER — Encounter: Payer: Self-pay | Admitting: Internal Medicine

## 2020-11-12 ENCOUNTER — Telehealth: Payer: Self-pay | Admitting: Internal Medicine

## 2020-11-12 NOTE — Telephone Encounter (Signed)
Patient aware Dr Ronnald Ramp not in office today.  Patient requesting  predniSONE (DELTASONE) 10 MG tabletfor hip pain

## 2020-11-17 ENCOUNTER — Other Ambulatory Visit: Payer: Self-pay | Admitting: Internal Medicine

## 2020-11-17 DIAGNOSIS — M1611 Unilateral primary osteoarthritis, right hip: Secondary | ICD-10-CM

## 2020-11-17 DIAGNOSIS — M1612 Unilateral primary osteoarthritis, left hip: Secondary | ICD-10-CM

## 2020-11-17 MED ORDER — PREDNISONE 10 MG PO TABS: 10.0000 mg | ORAL_TABLET | Freq: Two times a day (BID) | ORAL | 0 refills | Status: DC

## 2020-11-17 NOTE — Telephone Encounter (Signed)
Patient requesting  predniSONE (DELTASONE) 10 MG tabletfor hip pain

## 2020-11-19 ENCOUNTER — Other Ambulatory Visit: Payer: Self-pay | Admitting: Cardiology

## 2020-11-19 ENCOUNTER — Ambulatory Visit: Payer: Medicare HMO | Admitting: Cardiology

## 2020-11-19 ENCOUNTER — Other Ambulatory Visit: Payer: Self-pay

## 2020-11-19 ENCOUNTER — Encounter: Payer: Self-pay | Admitting: Cardiology

## 2020-11-19 VITALS — BP 156/79 | HR 81 | Temp 98.8°F | Resp 16 | Ht 70.0 in | Wt 260.0 lb

## 2020-11-19 DIAGNOSIS — R7303 Prediabetes: Secondary | ICD-10-CM

## 2020-11-19 DIAGNOSIS — Z9989 Dependence on other enabling machines and devices: Secondary | ICD-10-CM | POA: Diagnosis not present

## 2020-11-19 DIAGNOSIS — G4733 Obstructive sleep apnea (adult) (pediatric): Secondary | ICD-10-CM | POA: Diagnosis not present

## 2020-11-19 DIAGNOSIS — Z87891 Personal history of nicotine dependence: Secondary | ICD-10-CM

## 2020-11-19 DIAGNOSIS — I10 Essential (primary) hypertension: Secondary | ICD-10-CM | POA: Diagnosis not present

## 2020-11-19 DIAGNOSIS — I517 Cardiomegaly: Secondary | ICD-10-CM

## 2020-11-19 DIAGNOSIS — Z8616 Personal history of COVID-19: Secondary | ICD-10-CM

## 2020-11-19 DIAGNOSIS — Z6837 Body mass index (BMI) 37.0-37.9, adult: Secondary | ICD-10-CM | POA: Diagnosis not present

## 2020-11-19 MED ORDER — HYDROCHLOROTHIAZIDE 25 MG PO TABS: 25.0000 mg | ORAL_TABLET | Freq: Every morning | ORAL | 0 refills | Status: DC

## 2020-11-19 MED ORDER — LOSARTAN POTASSIUM 25 MG PO TABS: 25.0000 mg | ORAL_TABLET | Freq: Every day | ORAL | 0 refills | Status: DC
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
## 2020-11-22 ENCOUNTER — Other Ambulatory Visit: Payer: Self-pay

## 2020-11-22 DIAGNOSIS — I10 Essential (primary) hypertension: Secondary | ICD-10-CM

## 2020-12-01 ENCOUNTER — Ambulatory Visit: Payer: Medicare HMO

## 2020-12-01 ENCOUNTER — Other Ambulatory Visit: Payer: Self-pay

## 2020-12-01 DIAGNOSIS — I517 Cardiomegaly: Secondary | ICD-10-CM | POA: Diagnosis not present

## 2020-12-01 DIAGNOSIS — I10 Essential (primary) hypertension: Secondary | ICD-10-CM | POA: Diagnosis not present

## 2020-12-02 LAB — BASIC METABOLIC PANEL
BUN/Creatinine Ratio: 25 — ABNORMAL HIGH (ref 10–24)
BUN: 24 mg/dL (ref 8–27)
CO2: 29 mmol/L (ref 20–29)
Calcium: 9.7 mg/dL (ref 8.6–10.2)
Chloride: 96 mmol/L (ref 96–106)
Creatinine, Ser: 0.97 mg/dL (ref 0.76–1.27)
Glucose: 100 mg/dL — ABNORMAL HIGH (ref 65–99)
Potassium: 4.5 mmol/L (ref 3.5–5.2)
Sodium: 140 mmol/L (ref 134–144)
eGFR: 86 mL/min/{1.73_m2} (ref >59)

## 2020-12-02 LAB — MAGNESIUM: Magnesium: 2.3 mg/dL (ref 1.6–2.3)

## 2020-12-03 ENCOUNTER — Other Ambulatory Visit: Payer: Self-pay | Admitting: Internal Medicine

## 2020-12-03 ENCOUNTER — Telehealth: Payer: Self-pay | Admitting: Internal Medicine

## 2020-12-03 DIAGNOSIS — M1611 Unilateral primary osteoarthritis, right hip: Secondary | ICD-10-CM

## 2020-12-03 DIAGNOSIS — M1612 Unilateral primary osteoarthritis, left hip: Secondary | ICD-10-CM

## 2020-12-03 MED ORDER — PREDNISONE 10 MG PO TABS
10.0000 mg | ORAL_TABLET | Freq: Two times a day (BID) | ORAL | 0 refills | Status: DC
Start: 2020-12-03 — End: 2020-12-31

## 2020-12-03 NOTE — Telephone Encounter (Signed)
   Patient calling to request new order for predniSONE (DELTASONE) 10 MG tablet  He states he doesn't have an appointment to see Rheumatology until later in the month  Please call

## 2020-12-07 DIAGNOSIS — H52203 Unspecified astigmatism, bilateral: Secondary | ICD-10-CM | POA: Diagnosis not present

## 2020-12-07 DIAGNOSIS — H25813 Combined forms of age-related cataract, bilateral: Secondary | ICD-10-CM | POA: Diagnosis not present

## 2020-12-07 DIAGNOSIS — H524 Presbyopia: Secondary | ICD-10-CM | POA: Diagnosis not present

## 2020-12-07 DIAGNOSIS — H5213 Myopia, bilateral: Secondary | ICD-10-CM | POA: Diagnosis not present

## 2020-12-07 NOTE — Progress Notes (Signed)
Spoke to patient regarding echo results he voiced understanding

## 2020-12-07 NOTE — Progress Notes (Signed)
Spoke to patient he voiced understanding

## 2020-12-21 LAB — HM DIABETES EYE EXAM

## 2020-12-30 ENCOUNTER — Encounter: Payer: Self-pay | Admitting: Internal Medicine

## 2020-12-30 ENCOUNTER — Other Ambulatory Visit: Payer: Self-pay

## 2020-12-30 ENCOUNTER — Ambulatory Visit (INDEPENDENT_AMBULATORY_CARE_PROVIDER_SITE_OTHER): Payer: Medicare HMO | Admitting: Internal Medicine

## 2020-12-30 VITALS — BP 134/86 | HR 63 | Temp 98.3°F | Resp 16 | Ht 70.0 in | Wt 250.0 lb

## 2020-12-30 DIAGNOSIS — M8949 Other hypertrophic osteoarthropathy, multiple sites: Secondary | ICD-10-CM | POA: Insufficient documentation

## 2020-12-30 DIAGNOSIS — I10 Essential (primary) hypertension: Secondary | ICD-10-CM | POA: Diagnosis not present

## 2020-12-30 DIAGNOSIS — M545 Low back pain, unspecified: Secondary | ICD-10-CM | POA: Diagnosis not present

## 2020-12-30 DIAGNOSIS — M1611 Unilateral primary osteoarthritis, right hip: Secondary | ICD-10-CM | POA: Diagnosis not present

## 2020-12-30 DIAGNOSIS — M5030 Other cervical disc degeneration, unspecified cervical region: Secondary | ICD-10-CM | POA: Diagnosis not present

## 2020-12-30 DIAGNOSIS — M159 Polyosteoarthritis, unspecified: Secondary | ICD-10-CM | POA: Insufficient documentation

## 2020-12-30 DIAGNOSIS — G894 Chronic pain syndrome: Secondary | ICD-10-CM | POA: Diagnosis not present

## 2020-12-30 DIAGNOSIS — E119 Type 2 diabetes mellitus without complications: Secondary | ICD-10-CM

## 2020-12-30 LAB — CORTISOL: Cortisol, Plasma: 1.8 ug/dL

## 2020-12-30 LAB — HEMOGLOBIN A1C: Hgb A1c MFr Bld: 6.9 % — ABNORMAL HIGH (ref 4.6–6.5)

## 2020-12-30 NOTE — Progress Notes (Signed)
Subjective:  Patient ID: Donald [name], male    DOB: 04/03/1956  Age: 65 y.o. MRN: [id number]  CC: Osteoarthritis and Diabetes  This visit occurred during the SARS-CoV-2 public health emergency.  Safety protocols were in place, including screening questions prior to the visit, additional usage of staff PPE, and extensive cleaning of exam room while observing appropriate contact time as indicated for disinfecting solutions.    HPI Donald [name] presents for f/up -he has chronic pain in his large joints.  He tells me he is scheduled for surgery in the next few months.  He is controlling the pain with Suboxone.  He tells me he needs a new pain management doctor.  He cannot exercise much due to the joint pain but when he is active he does not experience chest pain, shortness of breath, dizziness, or lightheadedness.  Outpatient Medications Prior to Visit  Medication Sig Dispense Refill   buprenorphine-naloxone (SUBOXONE) 8-2 mg SUBL SL tablet Place 1 tablet under the tongue daily.     Cholecalciferol (D3 VITAMIN PO) Take by mouth. Plus Vitamin K liquid daily     hydrochlorothiazide (HYDRODIURIL) 25 MG tablet TAKE 1 TABLET(25 MG) BY MOUTH EVERY MORNING 90 tablet 0   losartan (COZAAR) 25 MG tablet TAKE 1 TABLET(25 MG) BY MOUTH DAILY AT 10 PM 90 tablet 0   Multiple Vitamin (MULTIVITAMIN) tablet Take by mouth.     NON FORMULARY Vitamin A,D,K daily     omeprazole (PRILOSEC) 40 MG capsule TAKE 1 TABLET BY MOUTH DAILY  1   Pyridoxine HCl (B-6 PO) Take by mouth.     rosuvastatin (CRESTOR) 10 MG tablet Take 1 tablet (10 mg total) by mouth daily. 90 tablet 1   vitamin B-12 (CYANOCOBALAMIN) 100 MCG tablet Take 100 mcg by mouth daily.     Facility-Administered Medications Prior to Visit  Medication Dose Route Frequency Provider Last Rate Last Admin   lactated ringers infusion   Intravenous Continuous Rometta Emery, PA-C   New Bag at 10/06/11 1151    ROS Review of Systems  Constitutional:   Negative for diaphoresis and fatigue.  HENT: Negative.    Eyes: Negative.   Respiratory:  Negative for cough, chest tightness, shortness of breath and wheezing.   Cardiovascular:  Negative for chest pain, palpitations and leg swelling.  Gastrointestinal:  Negative for abdominal pain, constipation, diarrhea, nausea and vomiting.  Endocrine: Negative.   Genitourinary: Negative.  Negative for difficulty urinating and dysuria.  Musculoskeletal:  Positive for arthralgias. Negative for myalgias and neck pain.  Skin: Negative.   Neurological: Negative.  Negative for dizziness, weakness and headaches.  Hematological:  Negative for adenopathy. Does not bruise/bleed easily.  Psychiatric/Behavioral: Negative.     Objective:  BP 134/86 (BP Location: Left Arm, Patient Position: Sitting, Cuff Size: Large)   Pulse 63   Temp 98.3 F (36.8 C) (Oral)   Resp 16   Ht '5\' 10"'$  (1.778 m)   Wt 250 lb (113.4 kg)   SpO2 96%   BMI 35.87 kg/m   BP Readings from Last 3 Encounters:  12/30/20 134/86  11/19/20 (!) 156/79  10/27/20 132/86    Wt Readings from Last 3 Encounters:  12/30/20 250 lb (113.4 kg)  11/19/20 260 lb (117.9 kg)  10/27/20 256 lb (116.1 kg)    Physical Exam Vitals reviewed.  HENT:     Nose: Nose normal.     Mouth/Throat:     Mouth: Mucous membranes are moist.  Eyes:  Conjunctiva/sclera: Conjunctivae normal.  Cardiovascular:     Rate and Rhythm: Normal rate and regular rhythm.     Heart sounds: No murmur heard. Pulmonary:     Effort: Pulmonary effort is normal.     Breath sounds: No stridor. No wheezing, rhonchi or rales.  Abdominal:     General: Abdomen is flat and protuberant.     Palpations: There is no mass.     Tenderness: There is no abdominal tenderness. There is no guarding.  Musculoskeletal:        General: Normal range of motion.     Cervical back: Neck supple.     Right lower leg: No edema.     Left lower leg: No edema.  Lymphadenopathy:     Cervical: No  cervical adenopathy.  Skin:    General: Skin is warm and dry.  Neurological:     General: No focal deficit present.  Psychiatric:        Mood and Affect: Mood normal.        Behavior: Behavior normal.    Lab Results  Component Value Date   WBC 8.1 09/30/2020   HGB 13.8 09/30/2020   HCT 42.1 09/30/2020   PLT 185.0 09/30/2020   GLUCOSE 100 (H) 12/01/2020   CHOL 168 09/30/2020   TRIG 72.0 09/30/2020   HDL 55.70 09/30/2020   LDLCALC 98 09/30/2020   ALT 21 09/30/2020   AST 20 09/30/2020   NA 140 12/01/2020   K 4.5 12/01/2020   CL 96 12/01/2020   CREATININE 0.97 12/01/2020   BUN 24 12/01/2020   CO2 29 12/01/2020   TSH 1.02 09/30/2020   PSA 0.59 09/30/2020   INR 1.00 10/08/2009   HGBA1C 6.9 (H) 12/30/2020   MICROALBUR <0.7 09/30/2020    No results found.  Assessment & Plan:   Josiahs was seen today for osteoarthritis and diabetes.  Diagnoses and all orders for this visit:  Primary hypertension- His blood pressure is adequately well controlled. -     Cortisol; Future -     Cortisol  Type 2 diabetes mellitus without complication, without long-term current use of insulin ([place])- His A1c is up to 6.9%.  I recommended that he start taking metformin. -     Hemoglobin A1c; Future -     Hemoglobin A1c -     metFORMIN (GLUCOPHAGE XR) 750 MG 24 hr tablet; Take 1 tablet (750 mg total) by mouth daily with breakfast.  Chronic pain syndrome -     Ambulatory referral to Pain Clinic  I am having Lourena Simmonds start on metFORMIN. I am also having him maintain his buprenorphine-naloxone, multivitamin, omeprazole, Pyridoxine HCl (B-6 PO), NON FORMULARY, Cholecalciferol (D3 VITAMIN PO), rosuvastatin, vitamin B-12, hydrochlorothiazide, and losartan.  Meds ordered this encounter  Medications   metFORMIN (GLUCOPHAGE XR) 750 MG 24 hr tablet    Sig: Take 1 tablet (750 mg total) by mouth daily with breakfast.    Dispense:  90 tablet    Refill:  1      Follow-up: Return in about 6  months (around 07/02/2021).  Scarlette Calico, MD

## 2020-12-30 NOTE — Patient Instructions (Signed)

## 2020-12-31 ENCOUNTER — Other Ambulatory Visit: Payer: Self-pay | Admitting: Internal Medicine

## 2020-12-31 ENCOUNTER — Ambulatory Visit: Payer: Medicare HMO | Admitting: Cardiology

## 2020-12-31 ENCOUNTER — Encounter: Payer: Self-pay | Admitting: Cardiology

## 2020-12-31 ENCOUNTER — Telehealth: Payer: Self-pay

## 2020-12-31 VITALS — BP 131/83 | HR 99 | Temp 98.8°F | Resp 16 | Ht 70.0 in | Wt 251.0 lb

## 2020-12-31 DIAGNOSIS — G4733 Obstructive sleep apnea (adult) (pediatric): Secondary | ICD-10-CM

## 2020-12-31 DIAGNOSIS — Z6836 Body mass index (BMI) 36.0-36.9, adult: Secondary | ICD-10-CM

## 2020-12-31 DIAGNOSIS — E1165 Type 2 diabetes mellitus with hyperglycemia: Secondary | ICD-10-CM | POA: Diagnosis not present

## 2020-12-31 DIAGNOSIS — Z9989 Dependence on other enabling machines and devices: Secondary | ICD-10-CM | POA: Diagnosis not present

## 2020-12-31 DIAGNOSIS — Z8616 Personal history of COVID-19: Secondary | ICD-10-CM

## 2020-12-31 DIAGNOSIS — Z87891 Personal history of nicotine dependence: Secondary | ICD-10-CM

## 2020-12-31 DIAGNOSIS — I10 Essential (primary) hypertension: Secondary | ICD-10-CM

## 2020-12-31 DIAGNOSIS — R7303 Prediabetes: Secondary | ICD-10-CM

## 2020-12-31 DIAGNOSIS — M1612 Unilateral primary osteoarthritis, left hip: Secondary | ICD-10-CM

## 2020-12-31 DIAGNOSIS — I517 Cardiomegaly: Secondary | ICD-10-CM

## 2020-12-31 DIAGNOSIS — M1611 Unilateral primary osteoarthritis, right hip: Secondary | ICD-10-CM

## 2020-12-31 MED ORDER — HYDROCHLOROTHIAZIDE 25 MG PO TABS: ORAL_TABLET | ORAL | 1 refills | Status: DC

## 2020-12-31 MED ORDER — METFORMIN HCL ER 750 MG PO TB24: 750.0000 mg | ORAL_TABLET | Freq: Every day | ORAL | 1 refills | Status: DC

## 2020-12-31 MED ORDER — LOSARTAN POTASSIUM 25 MG PO TABS
ORAL_TABLET | ORAL | 1 refills | Status: DC
Start: 2020-12-31 — End: 2021-08-16

## 2020-12-31 NOTE — Telephone Encounter (Signed)
pt has stated he is in need of a rx refill for his prednisone as per the pt Dr. Ronnald Ramp told him him that he would send it in at the pts apptmnt on yesterday.
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
## 2021-01-08 ENCOUNTER — Other Ambulatory Visit: Payer: Self-pay | Admitting: Internal Medicine

## 2021-01-08 DIAGNOSIS — I119 Hypertensive heart disease without heart failure: Secondary | ICD-10-CM

## 2021-01-08 DIAGNOSIS — I10 Essential (primary) hypertension: Secondary | ICD-10-CM

## 2021-01-09 ENCOUNTER — Other Ambulatory Visit: Payer: Self-pay | Admitting: Internal Medicine

## 2021-01-11 ENCOUNTER — Other Ambulatory Visit: Payer: Self-pay | Admitting: Internal Medicine

## 2021-01-11 ENCOUNTER — Telehealth: Payer: Self-pay | Admitting: Internal Medicine

## 2021-01-11 DIAGNOSIS — E119 Type 2 diabetes mellitus without complications: Secondary | ICD-10-CM

## 2021-01-11 MED ORDER — TIRZEPATIDE 2.5 MG/0.5ML ~~LOC~~ SOAJ
2.5000 mg | SUBCUTANEOUS | 0 refills | Status: DC
Start: 2021-01-11 — End: 2021-02-14

## 2021-01-11 NOTE — Telephone Encounter (Signed)
Patient recently prescribed: metFORMIN (GLUCOPHAGE XR) 750 MG 24 hr tablet   Having trouble swallowing meds  Wants to know if there is something else that he can take in place of this that's smaller & easier to swallow  Callback [phone]

## 2021-01-13 DIAGNOSIS — N138 Other obstructive and reflux uropathy: Secondary | ICD-10-CM | POA: Diagnosis not present

## 2021-01-13 DIAGNOSIS — N3941 Urge incontinence: Secondary | ICD-10-CM | POA: Diagnosis not present

## 2021-01-13 DIAGNOSIS — N401 Enlarged prostate with lower urinary tract symptoms: Secondary | ICD-10-CM | POA: Diagnosis not present

## 2021-01-13 DIAGNOSIS — N3281 Overactive bladder: Secondary | ICD-10-CM | POA: Diagnosis not present

## 2021-01-14 ENCOUNTER — Telehealth: Payer: Self-pay

## 2021-01-14 NOTE — Telephone Encounter (Signed)
Per Dr Jodene Nam office cannot take patient, they don not prescribe Suboxone. Pt has called asking why hasn't he heard from anyone on this referral or from a pain mangemnt office.

## 2021-01-15 ENCOUNTER — Other Ambulatory Visit: Payer: Self-pay | Admitting: Internal Medicine

## 2021-01-15 DIAGNOSIS — M1611 Unilateral primary osteoarthritis, right hip: Secondary | ICD-10-CM

## 2021-01-15 DIAGNOSIS — M1612 Unilateral primary osteoarthritis, left hip: Secondary | ICD-10-CM

## 2021-01-17 ENCOUNTER — Other Ambulatory Visit: Payer: Self-pay | Admitting: Internal Medicine

## 2021-01-17 NOTE — Telephone Encounter (Signed)
Patient following up on previous call her made regarding having trouble swallowing the metformin  Please follow up w/ patient [phone]

## 2021-01-17 NOTE — Telephone Encounter (Signed)
Called pt, LVM.   

## 2021-01-18 ENCOUNTER — Other Ambulatory Visit: Payer: Self-pay | Admitting: Internal Medicine

## 2021-01-18 DIAGNOSIS — M1611 Unilateral primary osteoarthritis, right hip: Secondary | ICD-10-CM

## 2021-01-18 DIAGNOSIS — M1612 Unilateral primary osteoarthritis, left hip: Secondary | ICD-10-CM

## 2021-01-19 ENCOUNTER — Other Ambulatory Visit: Payer: Self-pay | Admitting: Internal Medicine

## 2021-01-19 NOTE — Telephone Encounter (Signed)
Pt stated that he has not picked up the Rx because he is not ready to start an injection. He would like to know if it is an "in between" medication better than metformin but isn't an injectable that he can take? Please advise

## 2021-01-23 NOTE — Discharge Instructions (Signed)
Instructions after Total Hip Replacement     Avik Leoni P. Kiegan Macaraeg, Jr., M.D.     Dept. of Orthopaedics & Sports Medicine  [facility]  [address]  [place], [place]  [address]  Phone: [phone]   Fax: [phone]    DIET: . Drink plenty of non-alcoholic fluids. . Resume your normal diet. Include foods high in fiber.  ACTIVITY:  . You may use crutches or a walker with weight-bearing as tolerated, unless instructed otherwise. . You may be weaned off of the walker or crutches by your Physical Therapist.  . Do NOT reach below the level of your knees or cross your legs until allowed.    . Continue doing gentle exercises. Exercising will reduce the pain and swelling, increase motion, and prevent muscle weakness.   . Please continue to use the TED compression stockings for 6 weeks. You may remove the stockings at night, but should reapply them in the morning. . Do not drive or operate any equipment until instructed.  WOUND CARE:  . Continue to use ice packs periodically to reduce pain and swelling. . Keep the incision clean and dry. . You may bathe or shower after the staples are removed at the first office visit following surgery.  MEDICATIONS: . You may resume your regular medications. . Please take the pain medication as prescribed on the medication. . Do not take pain medication on an empty stomach. . You have been given a prescription for a blood thinner to prevent blood clots. Please take the medication as instructed. (NOTE: After completing a 2 week course of Lovenox, take one Enteric-coated aspirin once a day.) . Pain medications and iron supplements can cause constipation. Use a stool softener (Senokot or Colace) on a daily basis and a laxative (dulcolax or miralax) as needed. . Do not drive or drink alcoholic beverages when taking pain medications.  CALL THE OFFICE FOR: . Temperature above 101 degrees . Excessive bleeding or drainage on the dressing. . Excessive  swelling, coldness, or paleness of the toes. . Persistent nausea and vomiting.  FOLLOW-UP:  . You should have an appointment to return to the office in 6 weeks after surgery. . Arrangements have been made for continuation of Physical Therapy (either home therapy or outpatient therapy).     [facility] Department Directory         [email or web address]       [email or web address]          Cardiology  Appointments: [place] - [phone] [place] - [phone]  Endocrinology  Appointments: [place] - [phone] [place] - [phone]  Gastroenterology  Appointments: [place] - [phone] [place] - [phone]        General Surgery   Appointments: [place] - [phone]  Internal Medicine/Family Medicine  Appointments: [place] - [phone] [place] - [phone] [place] - [phone]  Metabolic and Weigh Loss Surgery  Appointments: [place] - [phone]        Neurology  Appointments: [place] - [phone] [place] - [phone]  Neurosurgery  Appointments: [place] - [phone]  Obstetrics & Gynecology  Appointments: [place] - [phone] [place] - [phone]        Pediatrics  Appointments: [place] - [phone] [place] - [phone]  Physiatry  Appointments: [place] -[phone]  Physical Therapy  Appointments: [place] - [phone] [place] - [phone]        Podiatry  Appointments: [place] - [phone] [place] - [phone]  Pulmonology  Appointments: [place] - [phone]  Rheumatology  Appointments: [place] - [phone]         Location: [facility]  [address] [place], [place]  [address]  [place] Location: [facility] [address] [place], [place]  [address]  [place] Location: [facility] [address] [place],   [address]    

## 2021-02-01 ENCOUNTER — Telehealth: Payer: Self-pay | Admitting: Internal Medicine

## 2021-02-01 NOTE — Telephone Encounter (Signed)
Patient calling in bc he has upcoming surgery  Would like all results from previous visit faxed over to surgeons office before surgery.  [facility]  Dr. Juanito Doom. (F) [phone]

## 2021-02-03 NOTE — Telephone Encounter (Signed)
Spoke to the pt. He stated [facility] informed him that the lab work needed could have a date ranging 4-58moprior.   I have faxed CPE labs from April 2022 to the fax number provided.

## 2021-02-04 ENCOUNTER — Other Ambulatory Visit: Payer: Self-pay

## 2021-02-04 ENCOUNTER — Encounter
Admission: RE | Admit: 2021-02-04 | Discharge: 2021-02-04 | Disposition: A | Discharge status: Home / Self Care | Payer: Medicare HMO | Source: Ambulatory Visit | Attending: Orthopedic Surgery | Admitting: Orthopedic Surgery

## 2021-02-04 ENCOUNTER — Encounter: Payer: Medicare HMO | Admitting: *Deleted

## 2021-02-04 ENCOUNTER — Telehealth: Payer: Self-pay | Admitting: *Deleted

## 2021-02-04 DIAGNOSIS — Z01812 Encounter for preprocedural laboratory examination: Secondary | ICD-10-CM | POA: Insufficient documentation

## 2021-02-04 DIAGNOSIS — Z0000 Encounter for general adult medical examination without abnormal findings: Secondary | ICD-10-CM

## 2021-02-04 HISTORY — DX: Essential (primary) hypertension: I10

## 2021-02-04 HISTORY — DX: Dyspnea, unspecified: R06.00

## 2021-02-04 HISTORY — DX: Type 2 diabetes mellitus without complications: E11.9

## 2021-02-04 LAB — URINALYSIS, ROUTINE W REFLEX MICROSCOPIC
Bacteria, UA: NONE SEEN
Bilirubin Urine: NEGATIVE
Glucose, UA: 100 mg/dL — AB
Hgb urine dipstick: NEGATIVE
Ketones, ur: 40 mg/dL — AB
Leukocytes,Ua: NEGATIVE
Nitrite: NEGATIVE
Specific Gravity, Urine: >1.03 — ABNORMAL HIGH (ref 1.005–1.030)
pH: 5 (ref 5.0–8.0)

## 2021-02-04 LAB — APTT: aPTT: 31 seconds (ref 24–36)

## 2021-02-04 LAB — CBC
HCT: 40.6 % (ref 39.0–52.0)
Hemoglobin: 13.6 g/dL (ref 13.0–17.0)
MCH: 30.3 pg (ref 26.0–34.0)
MCHC: 33.5 g/dL (ref 30.0–36.0)
MCV: 90.4 fL (ref 80.0–100.0)
Platelets: 210 10*3/uL (ref 150–400)
RBC: 4.49 MIL/uL (ref 4.22–5.81)
RDW: 12.3 % (ref 11.5–15.5)
WBC: 9.5 10*3/uL (ref 4.0–10.5)
nRBC: 0 % (ref 0.0–0.2)

## 2021-02-04 LAB — SURGICAL PCR SCREEN
MRSA, PCR: NEGATIVE
Staphylococcus aureus: NEGATIVE

## 2021-02-04 LAB — COMPREHENSIVE METABOLIC PANEL
ALT: 25 U/L (ref 0–44)
AST: 22 U/L (ref 15–41)
Albumin: 4.2 g/dL (ref 3.5–5.0)
Alkaline Phosphatase: 47 U/L (ref 38–126)
Anion gap: 8 (ref 5–15)
BUN: 23 mg/dL (ref 8–23)
CO2: 31 mmol/L (ref 22–32)
Calcium: 9.5 mg/dL (ref 8.9–10.3)
Chloride: 102 mmol/L (ref 98–111)
Creatinine, Ser: 0.97 mg/dL (ref 0.61–1.24)
GFR, Estimated: >60 mL/min (ref >60)
Glucose, Bld: 117 mg/dL — ABNORMAL HIGH (ref 70–99)
Potassium: 3.7 mmol/L (ref 3.5–5.1)
Sodium: 141 mmol/L (ref 135–145)
Total Bilirubin: 0.5 mg/dL (ref 0.3–1.2)
Total Protein: 8 g/dL (ref 6.5–8.1)

## 2021-02-04 LAB — TYPE AND SCREEN
ABO/RH(D): A POS
Antibody Screen: NEGATIVE

## 2021-02-04 LAB — HEMOGLOBIN A1C
Hgb A1c MFr Bld: 6.8 % — ABNORMAL HIGH (ref 4.8–5.6)
Mean Plasma Glucose: 148.46 mg/dL

## 2021-02-04 LAB — PROTIME-INR
INR: 1 (ref 0.8–1.2)
Prothrombin Time: 13.2 seconds (ref 11.4–15.2)

## 2021-02-04 LAB — SEDIMENTATION RATE: Sed Rate: 48 mm/hr — ABNORMAL HIGH (ref 0–20)

## 2021-02-04 LAB — C-REACTIVE PROTEIN: CRP: 1 mg/dL — ABNORMAL HIGH (ref <1.0)

## 2021-02-04 NOTE — Patient Instructions (Addendum)
Your procedure is scheduled on:02-16-21 Wednesday Report to the Registration Desk on the 1st floor of the [facility].Then proceed to the 2nd floor Surgery Desk in the [place] To find out your arrival time, please call [phone] between 1PM - 3PM on:02-15-21 Tuesday  REMEMBER: Instructions that are not followed completely may result in serious medical risk, up to and including death; or upon the discretion of your surgeon and anesthesiologist your surgery may need to be rescheduled.  Do not eat food after midnight the night before surgery.  No gum chewing, lozengers or hard candies.  You may however, drink Water up to 2 hours before you are scheduled to arrive for your surgery. Do not drink anything within 2 hours of your scheduled arrival time.  Type 1 and Type 2 diabetics should only drink water.  TAKE THESE MEDICATIONS THE MORNING OF SURGERY WITH A SIP OF WATER: -Prilosec (Omeprazole)-take one the night before and one on the morning of surgery - helps to prevent nausea after surgery.)  One week prior to surgery: Stop Anti-inflammatories (NSAIDS) such as Advil, Aleve, Ibuprofen, Motrin, Naproxen, Naprosyn and Aspirin based products such as Excedrin, Goodys Powder, BC Powder.You may however, continue to take Tylenol if needed for pain up until the day of surgery.  Stop ANY OVER THE COUNTER supplements/vitamins 7 days prior to surgery (Vitamin D3, B12, B6 Complex, Multivitamin, Vitamin A,D,K, Omega 3-6-9,Neuriva, Focus and Energy, Brain and Memory, MCT, I-Tyrofine and Turmeric)  No Alcohol for 24 hours before or after surgery.  No Smoking including e-cigarettes for 24 hours prior to surgery.  No chewable tobacco products for at least 6 hours prior to surgery.  No nicotine patches on the day of surgery.  Do not use any "recreational" drugs for at least a week prior to your surgery.  Please be advised that the combination of cocaine and anesthesia may have negative outcomes, up to  and including death. If you test positive for cocaine, your surgery will be cancelled.  On the morning of surgery brush your teeth with toothpaste and water, you may rinse your mouth with mouthwash if you wish. Do not swallow any toothpaste or mouthwash.  Do not wear jewelry, make-up, hairpins, clips or nail polish.  Do not wear lotions, powders, or perfumes.   Do not shave body from the neck down 48 hours prior to surgery just in case you cut yourself which could leave a site for infection.  Also, freshly shaved skin may become irritated if using the CHG soap.  Contact lenses, hearing aids and dentures may not be worn into surgery.  Do not bring valuables to the hospital. [facility] is not responsible for any missing/lost belongings or valuables.   Use CHG Soap as directed on instruction sheet.  Bring your C-PAP to the hospital with you  Notify your doctor if there is any change in your medical condition (cold, fever, infection).  Wear comfortable clothing (specific to your surgery type) to the hospital.  After surgery, you can help prevent lung complications by doing breathing exercises.  Take deep breaths and cough every 1-2 hours. Your doctor may order a device called an Incentive Spirometer to help you take deep breaths. When coughing or sneezing, hold a pillow firmly against your incision with both hands. This is called "splinting." Doing this helps protect your incision. It also decreases belly discomfort.  If you are being admitted to the hospital overnight, leave your suitcase in the car. After surgery it may be brought  to your room.  If you are being discharged the day of surgery, you will not be allowed to drive home. You will need a responsible adult (18 years or older) to drive you home and stay with you that night.   If you are taking public transportation, you will need to have a responsible adult (18 years or older) with you. Please confirm with your physician  that it is acceptable to use public transportation.   Please call the [place] Dept. at [phone] if you have any questions about these instructions.  Surgery Visitation Policy:  Patients undergoing a surgery or procedure may have one family member or support person with them as long as that person is not COVID-19 positive or experiencing its symptoms.  That person may remain in the waiting area during the procedure.  Inpatient Visitation:    Visiting hours are 7 a.m. to 8 p.m. Inpatients will be allowed two visitors daily. The visitors may change each day during the patient's stay. No visitors under the age of 41. Any visitor under the age of 78 must be accompanied by an adult. The visitor must pass COVID-19 screenings, use hand sanitizer when entering and exiting the patient's room and wear a mask at all times, including in the patient's room. Patients must also wear a mask when staff or their visitor are in the room. Masking is required regardless of vaccination status.

## 2021-02-04 NOTE — Telephone Encounter (Signed)
I connected with patient at 5:05pm for AWV. Pt decided not to proceed because he has had a long day. Pt wished to try this again at a later date so that he can truly dedicate the time & not be so "short tempered" during the visit.

## 2021-02-06 LAB — URINE CULTURE
Culture: NO GROWTH
Special Requests: NORMAL

## 2021-02-08 DIAGNOSIS — M1611 Unilateral primary osteoarthritis, right hip: Secondary | ICD-10-CM | POA: Diagnosis not present

## 2021-02-10 DIAGNOSIS — N3281 Overactive bladder: Secondary | ICD-10-CM | POA: Diagnosis not present

## 2021-02-10 DIAGNOSIS — N138 Other obstructive and reflux uropathy: Secondary | ICD-10-CM | POA: Diagnosis not present

## 2021-02-10 DIAGNOSIS — N401 Enlarged prostate with lower urinary tract symptoms: Secondary | ICD-10-CM | POA: Diagnosis not present

## 2021-02-10 LAB — LATEX, IGE: Latex: <0.1 kU/L

## 2021-02-13 NOTE — H&P (Signed)
ORTHOPAEDIC HISTORY & PHYSICAL Donald [name], [place] - 02/08/2021 8:45 AM EDT Formatting of this note is different from the original. [place] Chief Complaint:   Chief Complaint  Patient presents with   Hip Pain  H & P RIGHT HIP   History of Present Illness:   Donald [name] is a 65 y.o. male that presents to clinic today for his preoperative history and evaluation. Patient presents unaccompanied. The patient is scheduled to undergo a right total hip arthroplasty on 02/16/21 by Dr. Marry Guan. His pain began around 1 year ago. The pain is located in the right hip and groin. He describes his pain as worse with weightbearing. He reports associated decreased hip range of motion. He denies associated numbness or tingling.   The patient's symptoms have progressed to the point that they decrease his quality of life. The patient has previously undergone conservative treatment including NSAIDS and activity modification without adequate control of his symptoms.  Patient does have a history of lumbar fusion at L4-L5 as well as autofusion superior to that. He sees Dr. Terri Skains for left ventricular hypertrophy. Denies history of blood clots.  Patient does report a latex allergy, which results in a rash.   He will have help from his daughter as needed after surgery.  Past Medical, Surgical, Family, Social History, Allergies, Medications:   Past Medical History:  Past Medical History:  Diagnosis Date   Chronic back pain   Past Surgical History:  Past Surgical History:  Procedure Laterality Date   BACK SURGERY   Current Medications:  Current Outpatient Medications  Medication Sig Dispense Refill   b complex multivitamin (RENA-VITE) 0.8 mg Take 1 tablet by mouth once daily (Patient not taking: Reported on 10/06/2020)   b complex multivitamin (RENA-VITE) 0.8 mg Take by mouth   Bifidobacterium infantis (EVIVO WITH MCT OIL) 0.04 gram (8 bill cell/0.79m  Liqd Take 1 Dose by mouth once daily   calcium citrate-vitamin D3 200 mg-3.125 mcg (125 unit) Tab Take 1 tablet by mouth once daily   chol/gl/ser/RNA/phen/prg/hb150 (SHARPER FOCUS ORAL) Take 1 capsule by mouth once daily (Patient not taking: Reported on 10/06/2020)   coffee xt/phosphatidyl serine (NEURIVA ORIGINAL ORAL) Take 1 tablet by mouth once daily   fluticasone propionate (FLONASE) 50 mcg/actuation nasal spray Place 2 sprays into both nostrils once daily as needed for Rhinitis   ibuprofen (MOTRIN) 200 MG tablet Take 400 mg by mouth 2 (two) times daily as needed for Pain   loratadine (CLARITIN) 10 mg tablet Take 10 mg by mouth once daily as needed for Allergies   multivitamin tablet Take 1 tablet by mouth once daily   NON FORMULARY Place 4 drops under the tongue once daily D3 + K2 drops   omega 3-dha-epa-other om3-D3 (OMEGA-3 PLUS VITAMIN D3) 2,200 mg-1,000 unit/5 mL Liqd Take 4 drops by mouth once daily   omeprazole (PRILOSEC) 40 MG DR capsule Take 40 mg by mouth once daily   predniSONE (DELTASONE) 10 MG tablet Take 10 mg by mouth once daily   SUBOXONE 8-2 mg SL film Place 1 Film under the tongue 2 (two) times daily   TURMERIC ORAL Take 1 capsule by mouth once daily   tyrosine 1-15 gram-kcal/4 g Pack Take 1 packet by mouth once daily   UNABLE TO FIND Place 4 drops under the tongue once daily Vitamins A, D, K liquid drops   vit B complex no.12/niacin,B3, (VITAMIN B COMPLEX NO.12-NIACIN ORAL) Take 4 drops by  mouth once daily   No current facility-administered medications for this visit.   Allergies:  Allergies  Allergen Reactions   Cyclobenzaprine Hives   Diclofenac Itching, Rash, Shortness Of Breath and Swelling   Duloxetine Shortness Of Breath, Anxiety and Nausea   Fentanyl Shortness Of Breath, Other (See Comments) and Rash  Altered LOC Altered LOC   Adhesive Rash   Adhesive Tape-Silicones Rash   Latex, Natural Rubber Itching and Rash   Social History:  Social History    Socioeconomic History   Marital status: Single   Number of children: 4   Years of education: 18   Highest education level: Master's degree (e.g., MA, MS, MEng, MEd, MSW, MBA)  Occupational History   Occupation: Animator- [employer]  Tobacco Use   Smoking status: Former Smoker  Packs/day: 0.25  Types: Cigarettes  Quit date: 1992  Years since quitting: 30.7   Smokeless tobacco: Never Used  Scientific laboratory technician Use: Never used  Substance and Sexual Activity   Alcohol use: Never   Drug use: Never   Sexual activity: Defer  Partners: Female   Family History:  Family History  Problem Relation Age of Onset   Prostate cancer Brother   High blood pressure (Hypertension) Brother   Review of Systems:   A 10+ ROS was performed, reviewed, and the pertinent orthopaedic findings are documented in the HPI.   Physical Examination:   BP 132/80 (BP Location: Left upper arm, Patient Position: Sitting, BP Cuff Size: Large Adult)  Ht 177.8 cm ('5\' 10"'$ )  Wt (!) 111.9 kg (246 lb 12.8 oz)  BMI 35.41 kg/m   Patient is a well-developed, well-nourished male in no acute distress. Patient has normal mood and affect. Patient is alert and oriented to person, place, and time.   HEENT: Atraumatic, normocephalic. Pupils equal and reactive to light. Extraocular motion intact. Noninjected sclera.  Cardiovascular: Regular rate and rhythm, with no murmurs, rubs, or gallops. Distal pulses palpable. No bruits.  Respiratory: Lungs clear to auscultation bilaterally.    Right Hip: Pelvic tilt: Negative Limb lengths: Equal with the patient standing Soft tissue swelling: Negative Erythema: Negative Crepitance: Negative Tenderness: Greater trochanter is nontender to palpation. Moderate pain is elicited by axial compression or extremes of rotation. Atrophy: No atrophy. Good hip flexor and abductor strength. Range of Motion: EXT/FLEX: 0/0/100 ADD/ABD: 20/0/20 IR/ER: 10/0/20  Sensation is intact  over the saphenous, lateral cutaneous, superficial fibular, and deep fibular nerve distributions.  Tests Performed/Reviewed:  X-rays  Anteroposterior view of the pelvis as well as anteroposterior and lateral views of the right hip were obtained. Images show severe loss of femoroacetabular joint space with deformation of the femoral head. No fractures or other osseous abnormality noted.  I personally ordered and interpreted today's x-rays.  Impression:   ICD-10-CM  1. Primary osteoarthritis of right hip M16.11   Plan:   The patient has end-stage degenerative changes of the right hip. It was explained to the patient that the condition is progressive in nature. Having failed conservative treatment, the patient has elected to proceed with a total joint arthroplasty. The patient will undergo a total joint arthroplasty with Dr. Marry Guan. The risks of surgery, including blood clot and infection, were discussed with the patient. Measures to reduce these risks, including the use of anticoagulation, perioperative antibiotics, and early ambulation were discussed. The importance of postoperative physical therapy was discussed with the patient. The patient elects to proceed with surgery. The patient is instructed to stop all blood thinners  prior to surgery. The patient is instructed to call the hospital the day before surgery to learn of the proper arrival time.   Contact our office with any questions or concerns. Follow up as indicated, or sooner should any new problems arise, if conditions worsen, or if they are otherwise concerned.   Donald Fudge, PA-C [facility] [address] [place], [place] [address] Phone: [phone]  This note was generated in part with voice recognition software and I apologize for any typographical errors that were not detected and corrected.  Electronically signed by Donald Fudge, PA at 02/12/2021 8:49 PM EDT

## 2021-02-14 ENCOUNTER — Telehealth: Payer: Self-pay

## 2021-02-14 ENCOUNTER — Other Ambulatory Visit: Payer: Self-pay

## 2021-02-14 ENCOUNTER — Other Ambulatory Visit
Admission: RE | Admit: 2021-02-14 | Discharge: 2021-02-14 | Disposition: A | Discharge status: Home / Self Care | Payer: Medicare HMO | Source: Ambulatory Visit | Attending: Orthopedic Surgery | Admitting: Orthopedic Surgery

## 2021-02-14 ENCOUNTER — Other Ambulatory Visit: Payer: Self-pay | Admitting: Internal Medicine

## 2021-02-14 DIAGNOSIS — Z20822 Contact with and (suspected) exposure to covid-19: Secondary | ICD-10-CM | POA: Insufficient documentation

## 2021-02-14 DIAGNOSIS — M961 Postlaminectomy syndrome, not elsewhere classified: Secondary | ICD-10-CM | POA: Diagnosis not present

## 2021-02-14 DIAGNOSIS — E119 Type 2 diabetes mellitus without complications: Secondary | ICD-10-CM

## 2021-02-14 DIAGNOSIS — Z79899 Other long term (current) drug therapy: Secondary | ICD-10-CM | POA: Diagnosis not present

## 2021-02-14 DIAGNOSIS — M1611 Unilateral primary osteoarthritis, right hip: Secondary | ICD-10-CM | POA: Diagnosis not present

## 2021-02-14 DIAGNOSIS — Z01812 Encounter for preprocedural laboratory examination: Secondary | ICD-10-CM | POA: Insufficient documentation

## 2021-02-14 DIAGNOSIS — M47816 Spondylosis without myelopathy or radiculopathy, lumbar region: Secondary | ICD-10-CM | POA: Diagnosis not present

## 2021-02-14 MED ORDER — TIRZEPATIDE 5 MG/0.5ML ~~LOC~~ SOAJ: 5.0000 mg | SUBCUTANEOUS | 0 refills | Status: DC

## 2021-02-14 NOTE — Telephone Encounter (Signed)
Pt states Mounjaro seems to be working and was wondering if he could have it filled.

## 2021-02-15 LAB — SARS CORONAVIRUS 2 (TAT 6-24 HRS): SARS Coronavirus 2: NEGATIVE

## 2021-02-16 ENCOUNTER — Encounter: Payer: Self-pay | Admitting: Orthopedic Surgery

## 2021-02-16 ENCOUNTER — Ambulatory Visit: Payer: Medicare HMO | Admitting: Urgent Care

## 2021-02-16 ENCOUNTER — Observation Stay: Payer: Medicare HMO

## 2021-02-16 ENCOUNTER — Encounter
Admission: RE | Disposition: A | Discharge status: Home / Self Care | Payer: Self-pay | Source: Ambulatory Visit | Attending: Orthopedic Surgery

## 2021-02-16 ENCOUNTER — Other Ambulatory Visit: Payer: Self-pay

## 2021-02-16 ENCOUNTER — Ambulatory Visit: Payer: Medicare HMO | Admitting: Certified Registered Nurse Anesthetist

## 2021-02-16 ENCOUNTER — Observation Stay
Admission: RE | Admit: 2021-02-16 | Discharge: 2021-02-18 | Disposition: A | Discharge status: Home / Self Care | Payer: Medicare HMO | Source: Ambulatory Visit | Attending: Orthopedic Surgery | Admitting: Orthopedic Surgery

## 2021-02-16 DIAGNOSIS — Z87891 Personal history of nicotine dependence: Secondary | ICD-10-CM | POA: Insufficient documentation

## 2021-02-16 DIAGNOSIS — M1611 Unilateral primary osteoarthritis, right hip: Principal | ICD-10-CM | POA: Insufficient documentation

## 2021-02-16 DIAGNOSIS — Z8616 Personal history of COVID-19: Secondary | ICD-10-CM | POA: Diagnosis not present

## 2021-02-16 DIAGNOSIS — M25551 Pain in right hip: Secondary | ICD-10-CM | POA: Diagnosis present

## 2021-02-16 DIAGNOSIS — Z96649 Presence of unspecified artificial hip joint: Secondary | ICD-10-CM

## 2021-02-16 DIAGNOSIS — Z96641 Presence of right artificial hip joint: Secondary | ICD-10-CM

## 2021-02-16 DIAGNOSIS — Z79899 Other long term (current) drug therapy: Secondary | ICD-10-CM | POA: Diagnosis not present

## 2021-02-16 DIAGNOSIS — E119 Type 2 diabetes mellitus without complications: Secondary | ICD-10-CM | POA: Diagnosis not present

## 2021-02-16 DIAGNOSIS — I10 Essential (primary) hypertension: Secondary | ICD-10-CM | POA: Diagnosis not present

## 2021-02-16 DIAGNOSIS — E785 Hyperlipidemia, unspecified: Secondary | ICD-10-CM | POA: Diagnosis not present

## 2021-02-16 DIAGNOSIS — Z91040 Latex allergy status: Secondary | ICD-10-CM | POA: Diagnosis not present

## 2021-02-16 DIAGNOSIS — Z471 Aftercare following joint replacement surgery: Secondary | ICD-10-CM | POA: Diagnosis not present

## 2021-02-16 HISTORY — PX: TOTAL HIP ARTHROPLASTY: SHX124

## 2021-02-16 LAB — ABO/RH: ABO/RH(D): A POS

## 2021-02-16 LAB — GLUCOSE, CAPILLARY
Glucose-Capillary: 109 mg/dL — ABNORMAL HIGH (ref 70–99)
Glucose-Capillary: 138 mg/dL — ABNORMAL HIGH (ref 70–99)
Glucose-Capillary: 132 mg/dL — ABNORMAL HIGH (ref 70–99)

## 2021-02-16 SURGERY — ARTHROPLASTY, HIP, TOTAL,POSTERIOR APPROACH
Anesthesia: General | Site: Hip | Laterality: Right

## 2021-02-16 MED ORDER — OXYCODONE HCL 5 MG PO TABS: 5.0000 mg | ORAL_TABLET | ORAL | Status: DC | PRN

## 2021-02-16 MED ORDER — CELECOXIB 200 MG PO CAPS
400.0000 mg | ORAL_CAPSULE | Freq: Once | ORAL | Status: AC
  Administered 2021-02-16: 400 mg via ORAL

## 2021-02-16 MED ORDER — PROPOFOL 10 MG/ML IV BOLUS
INTRAVENOUS | Status: DC | PRN
  Administered 2021-02-16: 170 mg via INTRAVENOUS

## 2021-02-16 MED ORDER — HYDROCHLOROTHIAZIDE 25 MG PO TABS
25.0000 mg | ORAL_TABLET | ORAL | Status: DC
  Administered 2021-02-17: 25 mg via ORAL
  Filled 2021-02-16: qty 1

## 2021-02-16 MED ORDER — DEXAMETHASONE SODIUM PHOSPHATE 10 MG/ML IJ SOLN
8.0000 mg | Freq: Once | INTRAMUSCULAR | Status: AC
  Administered 2021-02-16: 8 mg via INTRAVENOUS

## 2021-02-16 MED ORDER — BUPRENORPHINE HCL-NALOXONE HCL 8-2 MG SL SUBL
1.0000 | SUBLINGUAL_TABLET | Freq: Every day | SUBLINGUAL | Status: DC
Start: 2021-02-16 — End: 2021-02-18
  Administered 2021-02-16 – 2021-02-17 (×2): 1 via SUBLINGUAL
  Filled 2021-02-16 (×4): qty 1

## 2021-02-16 MED ORDER — PHENYLEPHRINE HCL (PRESSORS) 10 MG/ML IV SOLN
INTRAVENOUS | Status: AC
  Filled 2021-02-16: qty 1

## 2021-02-16 MED ORDER — PHENOL 1.4 % MT LIQD
1.0000 | OROMUCOSAL | Status: DC | PRN
  Filled 2021-02-16: qty 177

## 2021-02-16 MED ORDER — ROCURONIUM BROMIDE 100 MG/10ML IV SOLN
INTRAVENOUS | Status: DC | PRN
  Administered 2021-02-16 (×3): 20 mg via INTRAVENOUS
  Administered 2021-02-16: 50 mg via INTRAVENOUS
  Administered 2021-02-16: 20 mg via INTRAVENOUS
  Administered 2021-02-16: 10 mg via INTRAVENOUS
  Administered 2021-02-16 (×2): 20 mg via INTRAVENOUS

## 2021-02-16 MED ORDER — PHENYLEPHRINE HCL (PRESSORS) 10 MG/ML IV SOLN
INTRAVENOUS | Status: DC | PRN
  Administered 2021-02-16 (×4): 50 ug via INTRAVENOUS
  Administered 2021-02-16: 100 ug via INTRAVENOUS

## 2021-02-16 MED ORDER — FENTANYL CITRATE (PF) 100 MCG/2ML IJ SOLN: 25.0000 ug | INTRAMUSCULAR | Status: DC | PRN

## 2021-02-16 MED ORDER — ALUM & MAG HYDROXIDE-SIMETH 200-200-20 MG/5ML PO SUSP
30.0000 mL | ORAL | Status: DC | PRN
  Administered 2021-02-17: 30 mL via ORAL
  Filled 2021-02-16: qty 30

## 2021-02-16 MED ORDER — ONDANSETRON HCL 4 MG/2ML IJ SOLN
INTRAMUSCULAR | Status: AC
  Filled 2021-02-16: qty 2

## 2021-02-16 MED ORDER — ONDANSETRON HCL 4 MG/2ML IJ SOLN: 4.0000 mg | Freq: Once | INTRAMUSCULAR | Status: DC | PRN

## 2021-02-16 MED ORDER — TRANEXAMIC ACID-NACL 1000-0.7 MG/100ML-% IV SOLN
INTRAVENOUS | Status: AC
  Filled 2021-02-16: qty 100

## 2021-02-16 MED ORDER — ACETAMINOPHEN 325 MG PO TABS
325.0000 mg | ORAL_TABLET | Freq: Four times a day (QID) | ORAL | Status: DC | PRN
Start: 2021-02-17 — End: 2021-02-18

## 2021-02-16 MED ORDER — OXYCODONE HCL 5 MG PO TABS
10.0000 mg | ORAL_TABLET | ORAL | Status: DC | PRN
  Administered 2021-02-16 – 2021-02-18 (×8): 10 mg via ORAL
  Filled 2021-02-16 (×9): qty 2

## 2021-02-16 MED ORDER — ONDANSETRON HCL 4 MG PO TABS: 4.0000 mg | ORAL_TABLET | Freq: Four times a day (QID) | ORAL | Status: DC | PRN

## 2021-02-16 MED ORDER — METOCLOPRAMIDE HCL 10 MG PO TABS
10.0000 mg | ORAL_TABLET | Freq: Three times a day (TID) | ORAL | Status: DC
  Administered 2021-02-16 – 2021-02-18 (×6): 10 mg via ORAL
  Filled 2021-02-16 (×6): qty 1

## 2021-02-16 MED ORDER — HYDROMORPHONE HCL 1 MG/ML IJ SOLN
INTRAMUSCULAR | Status: AC
  Filled 2021-02-16: qty 1

## 2021-02-16 MED ORDER — CEFAZOLIN SODIUM-DEXTROSE 2-4 GM/100ML-% IV SOLN
2.0000 g | Freq: Four times a day (QID) | INTRAVENOUS | Status: AC
Start: 2021-02-16 — End: 2021-02-16
  Administered 2021-02-16 (×2): 2 g via INTRAVENOUS
  Filled 2021-02-16 (×2): qty 100

## 2021-02-16 MED ORDER — HYDROMORPHONE HCL 1 MG/ML IJ SOLN
INTRAMUSCULAR | Status: DC | PRN
  Administered 2021-02-16 (×3): .25 mg via INTRAVENOUS
  Administered 2021-02-16 (×2): .5 mg via INTRAVENOUS
  Administered 2021-02-16: .25 mg via INTRAVENOUS

## 2021-02-16 MED ORDER — SUGAMMADEX SODIUM 500 MG/5ML IV SOLN
INTRAVENOUS | Status: DC | PRN
  Administered 2021-02-16: 300 mg via INTRAVENOUS

## 2021-02-16 MED ORDER — ORAL CARE MOUTH RINSE: 15.0000 mL | Freq: Once | OROMUCOSAL | Status: AC

## 2021-02-16 MED ORDER — HYDROMORPHONE HCL 1 MG/ML IJ SOLN
0.5000 mg | INTRAMUSCULAR | Status: AC | PRN
  Administered 2021-02-16 (×2): 0.5 mg via INTRAVENOUS

## 2021-02-16 MED ORDER — PROPOFOL 1000 MG/100ML IV EMUL
INTRAVENOUS | Status: AC
  Filled 2021-02-16: qty 100

## 2021-02-16 MED ORDER — INSULIN ASPART 100 UNIT/ML IJ SOLN
0.0000 [IU] | Freq: Three times a day (TID) | INTRAMUSCULAR | Status: DC
  Administered 2021-02-17 (×2): 2 [IU] via SUBCUTANEOUS
  Filled 2021-02-16 (×2): qty 1

## 2021-02-16 MED ORDER — CEFAZOLIN SODIUM-DEXTROSE 2-4 GM/100ML-% IV SOLN
INTRAVENOUS | Status: AC
  Filled 2021-02-16: qty 100

## 2021-02-16 MED ORDER — DIPHENHYDRAMINE HCL 12.5 MG/5ML PO ELIX: 12.5000 mg | ORAL_SOLUTION | ORAL | Status: DC | PRN

## 2021-02-16 MED ORDER — FLUTICASONE PROPIONATE 50 MCG/ACT NA SUSP
2.0000 | Freq: Every day | NASAL | Status: DC | PRN
  Filled 2021-02-16: qty 16

## 2021-02-16 MED ORDER — ONDANSETRON HCL 4 MG/2ML IJ SOLN: 4.0000 mg | Freq: Four times a day (QID) | INTRAMUSCULAR | Status: DC | PRN

## 2021-02-16 MED ORDER — FOLIC ACID 1 MG PO TABS
0.5000 mg | ORAL_TABLET | Freq: Every day | ORAL | Status: DC
  Administered 2021-02-17 – 2021-02-18 (×2): 0.5 mg via ORAL
  Filled 2021-02-16 (×2): qty 1

## 2021-02-16 MED ORDER — VITAMIN D 25 MCG (1000 UNIT) PO TABS
5000.0000 [IU] | ORAL_TABLET | Freq: Two times a day (BID) | ORAL | Status: DC
  Administered 2021-02-16 – 2021-02-18 (×3): 5000 [IU] via ORAL
  Filled 2021-02-16 (×3): qty 5

## 2021-02-16 MED ORDER — ACETAMINOPHEN 10 MG/ML IV SOLN
1000.0000 mg | Freq: Four times a day (QID) | INTRAVENOUS | Status: AC
  Administered 2021-02-16 – 2021-02-17 (×3): 1000 mg via INTRAVENOUS
  Filled 2021-02-16 (×3): qty 100

## 2021-02-16 MED ORDER — CELECOXIB 200 MG PO CAPS
200.0000 mg | ORAL_CAPSULE | Freq: Two times a day (BID) | ORAL | Status: DC
  Administered 2021-02-16 – 2021-02-18 (×4): 200 mg via ORAL
  Filled 2021-02-16 (×4): qty 1

## 2021-02-16 MED ORDER — FERROUS SULFATE 325 (65 FE) MG PO TABS
325.0000 mg | ORAL_TABLET | Freq: Two times a day (BID) | ORAL | Status: DC
  Administered 2021-02-17 – 2021-02-18 (×3): 325 mg via ORAL
  Filled 2021-02-16 (×3): qty 1

## 2021-02-16 MED ORDER — ADULT MULTIVITAMIN W/MINERALS CH
1.0000 | ORAL_TABLET | Freq: Every day | ORAL | Status: DC
  Administered 2021-02-17 – 2021-02-18 (×2): 1 via ORAL
  Filled 2021-02-16 (×2): qty 1

## 2021-02-16 MED ORDER — MAGNESIUM HYDROXIDE 400 MG/5ML PO SUSP
30.0000 mL | Freq: Every day | ORAL | Status: DC
  Administered 2021-02-17 – 2021-02-18 (×2): 30 mL via ORAL
  Filled 2021-02-16 (×2): qty 30

## 2021-02-16 MED ORDER — HYDROMORPHONE HCL 1 MG/ML IJ SOLN
0.5000 mg | INTRAMUSCULAR | Status: DC | PRN
  Administered 2021-02-16 – 2021-02-17 (×2): 1 mg via INTRAVENOUS
  Filled 2021-02-16 (×2): qty 1

## 2021-02-16 MED ORDER — DEXAMETHASONE SODIUM PHOSPHATE 10 MG/ML IJ SOLN
INTRAMUSCULAR | Status: AC
  Filled 2021-02-16: qty 1

## 2021-02-16 MED ORDER — NEOMYCIN-POLYMYXIN B GU 40-200000 IR SOLN
Status: AC
  Filled 2021-02-16: qty 2

## 2021-02-16 MED ORDER — SUGAMMADEX SODIUM 500 MG/5ML IV SOLN
INTRAVENOUS | Status: AC
  Filled 2021-02-16: qty 5

## 2021-02-16 MED ORDER — VITAMIN B-12 100 MCG PO TABS
200.0000 ug | ORAL_TABLET | Freq: Every day | ORAL | Status: DC
  Administered 2021-02-17 – 2021-02-18 (×2): 200 ug via ORAL
  Filled 2021-02-16 (×3): qty 2

## 2021-02-16 MED ORDER — KETAMINE HCL 50 MG/5ML IJ SOSY
PREFILLED_SYRINGE | INTRAMUSCULAR | Status: AC
  Filled 2021-02-16: qty 5

## 2021-02-16 MED ORDER — ENOXAPARIN SODIUM 30 MG/0.3ML IJ SOSY
30.0000 mg | PREFILLED_SYRINGE | Freq: Two times a day (BID) | INTRAMUSCULAR | Status: DC
  Administered 2021-02-17 – 2021-02-18 (×3): 30 mg via SUBCUTANEOUS
  Filled 2021-02-16 (×3): qty 0.3

## 2021-02-16 MED ORDER — TRANEXAMIC ACID-NACL 1000-0.7 MG/100ML-% IV SOLN
INTRAVENOUS | Status: AC
  Administered 2021-02-16: 1000 mg via INTRAVENOUS
  Filled 2021-02-16: qty 100

## 2021-02-16 MED ORDER — MIDAZOLAM HCL 2 MG/2ML IJ SOLN
INTRAMUSCULAR | Status: AC
  Filled 2021-02-16: qty 2

## 2021-02-16 MED ORDER — SODIUM CHLORIDE 0.9 % IR SOLN
Status: DC | PRN
  Administered 2021-02-16: 3000 mL

## 2021-02-16 MED ORDER — KETAMINE HCL 10 MG/ML IJ SOLN
INTRAMUSCULAR | Status: DC | PRN
  Administered 2021-02-16: 20 mg via INTRAVENOUS
  Administered 2021-02-16: 10 mg via INTRAVENOUS

## 2021-02-16 MED ORDER — SODIUM CHLORIDE 0.9 % IV SOLN: INTRAVENOUS | Status: DC

## 2021-02-16 MED ORDER — SURGIRINSE WOUND IRRIGATION SYSTEM - OPTIME
TOPICAL | Status: DC | PRN
  Administered 2021-02-16: 900 mL via TOPICAL

## 2021-02-16 MED ORDER — BISACODYL 10 MG RE SUPP: 10.0000 mg | Freq: Every day | RECTAL | Status: DC | PRN

## 2021-02-16 MED ORDER — SUCCINYLCHOLINE CHLORIDE 200 MG/10ML IV SOSY
PREFILLED_SYRINGE | INTRAVENOUS | Status: DC | PRN
  Administered 2021-02-16: 120 mg via INTRAVENOUS

## 2021-02-16 MED ORDER — ACETAMINOPHEN 10 MG/ML IV SOLN
INTRAVENOUS | Status: AC
  Filled 2021-02-16: qty 100

## 2021-02-16 MED ORDER — TRANEXAMIC ACID-NACL 1000-0.7 MG/100ML-% IV SOLN
1000.0000 mg | INTRAVENOUS | Status: AC
  Administered 2021-02-16: 1000 mg via INTRAVENOUS

## 2021-02-16 MED ORDER — ROCURONIUM BROMIDE 10 MG/ML (PF) SYRINGE
PREFILLED_SYRINGE | INTRAVENOUS | Status: AC
  Filled 2021-02-16: qty 10

## 2021-02-16 MED ORDER — CHLORHEXIDINE GLUCONATE 0.12 % MT SOLN
15.0000 mL | Freq: Once | OROMUCOSAL | Status: AC
  Administered 2021-02-16: 15 mL via OROMUCOSAL

## 2021-02-16 MED ORDER — FOLIC ACID-VIT B6-VIT B12 0.5-5-0.2 MG PO TABS: 1.0000 | ORAL_TABLET | Freq: Every day | ORAL | Status: DC

## 2021-02-16 MED ORDER — LIDOCAINE HCL (PF) 2 % IJ SOLN
INTRAMUSCULAR | Status: AC
  Filled 2021-02-16: qty 5

## 2021-02-16 MED ORDER — SENNOSIDES-DOCUSATE SODIUM 8.6-50 MG PO TABS
1.0000 | ORAL_TABLET | Freq: Two times a day (BID) | ORAL | Status: DC
  Administered 2021-02-16 – 2021-02-18 (×4): 1 via ORAL
  Filled 2021-02-16 (×4): qty 1

## 2021-02-16 MED ORDER — GABAPENTIN 300 MG PO CAPS
ORAL_CAPSULE | ORAL | Status: AC
  Filled 2021-02-16: qty 1

## 2021-02-16 MED ORDER — LIDOCAINE HCL (CARDIAC) PF 100 MG/5ML IV SOSY
PREFILLED_SYRINGE | INTRAVENOUS | Status: DC | PRN
  Administered 2021-02-16: 60 mg via INTRAVENOUS

## 2021-02-16 MED ORDER — HYDROMORPHONE HCL 1 MG/ML IJ SOLN
INTRAMUSCULAR | Status: AC
  Administered 2021-02-16: 0.5 mg via INTRAVENOUS
  Filled 2021-02-16: qty 1

## 2021-02-16 MED ORDER — ACETAMINOPHEN 10 MG/ML IV SOLN
INTRAVENOUS | Status: DC | PRN
  Administered 2021-02-16: 1000 mg via INTRAVENOUS

## 2021-02-16 MED ORDER — TRANEXAMIC ACID-NACL 1000-0.7 MG/100ML-% IV SOLN: 1000.0000 mg | Freq: Once | INTRAVENOUS | Status: AC

## 2021-02-16 MED ORDER — CHLORHEXIDINE GLUCONATE 0.12 % MT SOLN
OROMUCOSAL | Status: AC
  Filled 2021-02-16: qty 15

## 2021-02-16 MED ORDER — CHLORHEXIDINE GLUCONATE 4 % EX LIQD
60.0000 mL | Freq: Once | CUTANEOUS | Status: AC
  Administered 2021-02-16: 4 via TOPICAL

## 2021-02-16 MED ORDER — OXYCODONE HCL 5 MG PO TABS
ORAL_TABLET | ORAL | Status: AC
  Administered 2021-02-16: 10 mg via ORAL
  Filled 2021-02-16: qty 2

## 2021-02-16 MED ORDER — PANTOPRAZOLE SODIUM 40 MG PO TBEC
40.0000 mg | DELAYED_RELEASE_TABLET | Freq: Two times a day (BID) | ORAL | Status: DC
  Administered 2021-02-16 – 2021-02-18 (×4): 40 mg via ORAL
  Filled 2021-02-16 (×4): qty 1

## 2021-02-16 MED ORDER — GABAPENTIN 300 MG PO CAPS
300.0000 mg | ORAL_CAPSULE | Freq: Once | ORAL | Status: AC
  Administered 2021-02-16: 300 mg via ORAL

## 2021-02-16 MED ORDER — SUCCINYLCHOLINE CHLORIDE 200 MG/10ML IV SOSY
PREFILLED_SYRINGE | INTRAVENOUS | Status: AC
  Filled 2021-02-16: qty 10

## 2021-02-16 MED ORDER — ROSUVASTATIN CALCIUM 10 MG PO TABS
10.0000 mg | ORAL_TABLET | Freq: Every day | ORAL | Status: DC
  Administered 2021-02-17 – 2021-02-18 (×2): 10 mg via ORAL
  Filled 2021-02-16 (×2): qty 1

## 2021-02-16 MED ORDER — SODIUM CHLORIDE 0.9 % IV SOLN
Status: DC | PRN
  Administered 2021-02-16: 502 mL

## 2021-02-16 MED ORDER — FENTANYL CITRATE (PF) 100 MCG/2ML IJ SOLN
INTRAMUSCULAR | Status: AC
  Filled 2021-02-16: qty 2

## 2021-02-16 MED ORDER — SODIUM CHLORIDE 0.9 % IV SOLN
INTRAVENOUS | Status: DC | PRN
  Administered 2021-02-16: 20 ug/min via INTRAVENOUS

## 2021-02-16 MED ORDER — VITAMIN B-6 50 MG PO TABS
50.0000 mg | ORAL_TABLET | Freq: Every day | ORAL | Status: DC
  Administered 2021-02-17 – 2021-02-18 (×2): 50 mg via ORAL
  Filled 2021-02-16 (×3): qty 1

## 2021-02-16 MED ORDER — MENTHOL 3 MG MT LOZG
1.0000 | LOZENGE | OROMUCOSAL | Status: DC | PRN
  Administered 2021-02-17: 3 mg via ORAL
  Filled 2021-02-16 (×2): qty 9

## 2021-02-16 MED ORDER — FLEET ENEMA 7-19 GM/118ML RE ENEM: 1.0000 | ENEMA | Freq: Once | RECTAL | Status: DC | PRN

## 2021-02-16 MED ORDER — CEFAZOLIN SODIUM-DEXTROSE 2-4 GM/100ML-% IV SOLN
2.0000 g | INTRAVENOUS | Status: AC
  Administered 2021-02-16: 2 g via INTRAVENOUS

## 2021-02-16 MED ORDER — INSULIN ASPART 100 UNIT/ML IJ SOLN
0.0000 [IU] | Freq: Every day | INTRAMUSCULAR | Status: DC
  Filled 2021-02-16: qty 1

## 2021-02-16 MED ORDER — ONDANSETRON HCL 4 MG/2ML IJ SOLN
INTRAMUSCULAR | Status: DC | PRN
  Administered 2021-02-16: 4 mg via INTRAVENOUS

## 2021-02-16 MED ORDER — CELECOXIB 200 MG PO CAPS
ORAL_CAPSULE | ORAL | Status: AC
  Filled 2021-02-16: qty 2

## 2021-02-16 SURGICAL SUPPLY — 64 items
"PENCIL ELECTRO HAND CTR " (MISCELLANEOUS) IMPLANT
BLADE DRUM FLTD (BLADE) ×2 IMPLANT
BLADE SAW 90X25X1.19 OSCILLAT (BLADE) ×2 IMPLANT
CARTRIDGE OIL MAESTRO DRILL (MISCELLANEOUS) ×1 IMPLANT
CUP ACETBLR 54 OD 100 SERIES (Hips) ×1 IMPLANT
DIFFUSER DRILL AIR PNEUMATIC (MISCELLANEOUS) ×2 IMPLANT
DRAPE 3/4 80X56 (DRAPES) ×2 IMPLANT
DRAPE INCISE IOBAN 66X60 STRL (DRAPES) ×2 IMPLANT
DRSG DERMACEA 8X12 NADH (GAUZE/BANDAGES/DRESSINGS) ×2 IMPLANT
DRSG MEPILEX SACRM 8.7X9.8 (GAUZE/BANDAGES/DRESSINGS) ×2 IMPLANT
DRSG OPSITE POSTOP 4X12 (GAUZE/BANDAGES/DRESSINGS) ×1 IMPLANT
DRSG OPSITE POSTOP 4X14 (GAUZE/BANDAGES/DRESSINGS) ×1 IMPLANT
DRSG OPSITE POSTOP 4X6 (GAUZE/BANDAGES/DRESSINGS) ×1 IMPLANT
DRSG TEGADERM 4X4.75 (GAUZE/BANDAGES/DRESSINGS) ×2 IMPLANT
DURAPREP 26ML APPLICATOR (WOUND CARE) ×2 IMPLANT
ELECT CAUTERY BLADE 6.4 (BLADE) ×2 IMPLANT
ELECT REM PT RETURN 9FT ADLT (ELECTROSURGICAL) ×2
ELECTRODE REM PT RTRN 9FT ADLT (ELECTROSURGICAL) ×1 IMPLANT
GAUZE 4X4 16PLY ~~LOC~~+RFID DBL (SPONGE) ×1 IMPLANT
GLOVE SURG ENC MOIS LTX SZ7.5 (GLOVE) ×2 IMPLANT
GLOVE SURG ENC TEXT LTX SZ7.5 (GLOVE) ×2 IMPLANT
GLOVE SURG UNDER LTX SZ8 (GLOVE) ×1 IMPLANT
GLOVE SURG UNDER POLY LF SZ7.5 (GLOVE) ×1 IMPLANT
GOWN STRL REUS W/ TWL LRG LVL3 (GOWN DISPOSABLE) ×2 IMPLANT
GOWN STRL REUS W/ TWL XL LVL3 (GOWN DISPOSABLE) ×1 IMPLANT
GOWN STRL REUS W/TWL LRG LVL3 (GOWN DISPOSABLE) ×4
GOWN STRL REUS W/TWL XL LVL3 (GOWN DISPOSABLE) ×2
HEAD M SROM 36MM 2 (Hips) IMPLANT
HEMOVAC 400CC 10FR (MISCELLANEOUS) ×2 IMPLANT
HOLDER FOLEY CATH W/STRAP (MISCELLANEOUS) ×2 IMPLANT
IRRIGATION SURGIPHOR STRL (IV SOLUTION) ×1 IMPLANT
IV NS IRRIG 3000ML ARTHROMATIC (IV SOLUTION) ×2 IMPLANT
KIT PEG BOARD PINK (KITS) ×2 IMPLANT
KIT TURNOVER KIT A (KITS) ×2 IMPLANT
LINER PINN ACET NEUT 32X54 (Liner) ×1 IMPLANT
MANIFOLD NEPTUNE II (INSTRUMENTS) ×4 IMPLANT
NDL SAFETY ECLIPSE 18X1.5 (NEEDLE) ×1 IMPLANT
NEEDLE HYPO 18GX1.5 SHARP (NEEDLE) ×2
NS IRRIG 500ML POUR BTL (IV SOLUTION) ×2 IMPLANT
OIL CARTRIDGE MAESTRO DRILL (MISCELLANEOUS) ×2
PACK HIP PROSTHESIS (MISCELLANEOUS) ×2 IMPLANT
PENCIL ELECTRO HAND CTR (MISCELLANEOUS) ×2 IMPLANT
PENCIL SMOKE EVACUATOR COATED (MISCELLANEOUS) ×2 IMPLANT
PULSAVAC PLUS IRRIG FAN TIP (DISPOSABLE) ×2
SOL PREP PVP 2OZ (MISCELLANEOUS) ×2
SOLUTION PREP PVP 2OZ (MISCELLANEOUS) ×1 IMPLANT
SPONGE DRAIN TRACH 4X4 STRL 2S (GAUZE/BANDAGES/DRESSINGS) ×2 IMPLANT
SPONGE T-LAP 18X18 ~~LOC~~+RFID (SPONGE) ×9 IMPLANT
SROM M HEAD 36MM 2 (Hips) ×2 IMPLANT
STAPLER SKIN PROX 35W (STAPLE) ×2 IMPLANT
STEM AML 12X155X30 STD SM 6IN (Joint) ×1 IMPLANT
SUT ETHIBOND #5 BRAIDED 30INL (SUTURE) ×2 IMPLANT
SUT VIC AB 0 CT1 36 (SUTURE) ×2 IMPLANT
SUT VIC AB 1 CT1 36 (SUTURE) ×4 IMPLANT
SUT VIC AB 2-0 CT1 27 (SUTURE) ×2
SUT VIC AB 2-0 CT1 TAPERPNT 27 (SUTURE) ×1 IMPLANT
SYR 20ML LL LF (SYRINGE) ×1 IMPLANT
TAPE CLOTH 3X10 WHT NS LF (GAUZE/BANDAGES/DRESSINGS) ×2 IMPLANT
TAPE TRANSPORE STRL 2 31045 (GAUZE/BANDAGES/DRESSINGS) ×2 IMPLANT
TIP FAN IRRIG PULSAVAC PLUS (DISPOSABLE) ×1 IMPLANT
TOWEL OR 17X26 4PK STRL BLUE (TOWEL DISPOSABLE) ×1 IMPLANT
TRAY FOLEY MTR SLVR 16FR STAT (SET/KITS/TRAYS/PACK) ×2 IMPLANT
WATER STERILE IRR 1000ML POUR (IV SOLUTION) ×1 IMPLANT
WATER STERILE IRR 500ML POUR (IV SOLUTION) ×1 IMPLANT
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
## 2021-02-16 NOTE — Op Note (Signed)
OPERATIVE NOTE  DATE OF SURGERY:  02/16/2021  PATIENT NAME:  Donald [name]   DOB: [birth year]  MRN: [id number]  PRE-OPERATIVE DIAGNOSIS: Degenerative arthrosis of the right hip, primary  POST-OPERATIVE DIAGNOSIS:  Same  PROCEDURE:  Right total hip arthroplasty  SURGEON:  Marciano Sequin. M.D.  ASSISTANT: Cassell Smiles, PA-C (present and scrubbed throughout the case, critical for assistance with exposure, retraction, instrumentation, and closure)  ANESTHESIA: general  ESTIMATED BLOOD LOSS: 250 mL  FLUIDS REPLACED: 1000 mL of crystalloid  DRAINS: 2 medium Hemovac drains  IMPLANTS UTILIZED: DePuy 12 mm small stature AML femoral stem, 54 mm OD Pinnacle 100 acetabular component, neutral Pinnacle Altrx polyethylene insert, and a 36 mm M-SPEC -2 mm hip ball  INDICATIONS FOR SURGERY: Donald [name] is a 65 y.o. year old male with a long history of progressive hip and groin  pain. X-rays demonstrated severe degenerative changes. The patient had not seen any significant improvement despite conservative nonsurgical intervention. After discussion of the risks and benefits of surgical intervention, the patient expressed understanding of the risks benefits and agree with plans for total hip arthroplasty.   The risks, benefits, and alternatives were discussed at length including but not limited to the risks of infection, bleeding, nerve injury, stiffness, blood clots, the need for revision surgery, limb length inequality, dislocation, cardiopulmonary complications, among others, and they were willing to proceed.  PROCEDURE IN DETAIL: The patient was brought into the operating room and, after adequate general anesthesia was achieved, the patient was placed in a left lateral decubitus position. Axillary roll was placed and all bony prominences were well-padded. The patient's right hip was cleaned and prepped with alcohol and DuraPrep and draped in the usual sterile fashion. A "timeout" was performed as per  usual protocol. A lateral curvilinear incision was made gently curving towards the posterior superior iliac spine. The IT band was incised in line with the skin incision and the fibers of the gluteus maximus were split in line. The piriformis tendon was identified, skeletonized, and incised at its insertion to the proximal femur and reflected posteriorly. A T type posterior capsulotomy was performed. Prior to dislocation of the femoral head, a threaded Steinmann pin was inserted through a separate stab incision into the pelvis superior to the acetabulum and bent in the form of a stylus so as to assess limb length and hip offset throughout the procedure. The femoral head was then dislocated posteriorly. Inspection of the femoral head demonstrated severe degenerative changes with full-thickness loss of articular cartilage.  The gluteal sling was incised so as to better mobilize the proximal femur.  The femoral neck cut was performed using an oscillating saw. The anterior capsule was elevated off of the femoral neck using a periosteal elevator. Attention was then directed to the acetabulum. The remnant of the labrum was excised using electrocautery. Inspection of the acetabulum also demonstrated significant degenerative changes. The acetabulum was reamed in sequential fashion up to a 53 mm diameter. Good punctate bleeding bone was encountered. A 54 mm Pinnacle 100 acetabular component was positioned and impacted into place. Good scratch fit was appreciated. A neutral polyethylene trial was inserted.  Periacetabular osteophytes were removed using an osteotome and rongeurs.  Attention was then directed to the proximal femur. A hole for reaming of the proximal femoral canal was created using a high-speed burr. The femoral canal was reamed in sequential fashion up to a 11.5 mm diameter. This allowed for approximately 6.5 cm of scratch fit.  It  was thus elected to ream up to a 12 mm diameter to allow for a line Fit.   Serial broaches were inserted up to a 12 mm small stature femoral broach. Calcar region was planed and a trial reduction was performed using a 36 mm hip ball with a -2 mm neck length. Good equalization of limb lengths and hip offset was appreciated and excellent stability was noted both anteriorly and posteriorly. Trial components were removed. The acetabular shell was irrigated with copious amounts of normal saline with antibiotic solution and suctioned dry. A neutral Pinnacle Altrx polyethylene insert was positioned and impacted into place. Next, a 12 mm small stature AML femoral stem was positioned and impacted into place. Excellent scratch fit was appreciated. A trial reduction was again performed with a 36 mm hip ball with a -2 mm neck length. Again, good equalization of limb lengths was appreciated and excellent stability appreciated both anteriorly and posteriorly. The hip was then dislocated and the trial hip ball was removed. The Morse taper was cleaned and dried. A 36 mm M-SPEC hip ball with a -2 mm neck length was placed on the trunnion and impacted into place. The hip was then reduced and placed through range of motion. Excellent stability was appreciated both anteriorly and posteriorly.  The wound was irrigated with copious amounts of normal saline followed by 500 ml of Surgiphor and suctioned dry. Good hemostasis was appreciated. The posterior capsulotomy was repaired using #5 Ethibond. Piriformis tendon was reapproximated to the undersurface of the gluteus medius tendon using #5 Ethibond. The IT band was reapproximated using interrupted sutures of #1 Vicryl. Subcutaneous tissue was approximated using first #0 Vicryl followed by #2-0 Vicryl. The skin was closed with skin staples.  The patient tolerated the procedure well and was transported to the recovery room in stable condition.   Marciano Sequin., M.D.

## 2021-02-16 NOTE — Anesthesia Procedure Notes (Signed)
Procedure Name: Intubation Date/Time: 02/16/2021 11:11 AM Performed by: Lily Peer, Kortny Lirette, CRNA Pre-anesthesia Checklist: Patient identified, Emergency Drugs available, Suction available and Patient being monitored Patient Re-evaluated:Patient Re-evaluated prior to induction Oxygen Delivery Method: Circle system utilized Preoxygenation: Pre-oxygenation with 100% oxygen Induction Type: IV induction Ventilation: Mask ventilation without difficulty Laryngoscope Size: McGraph and 4 Grade View: Grade I Tube type: Oral Tube size: 7.5 mm Number of attempts: 1 Airway Equipment and Method: Stylet Placement Confirmation: ETT inserted through vocal cords under direct vision, positive ETCO2 and breath sounds checked- equal and bilateral Secured at: 23 cm Tube secured with: Tape Dental Injury: Teeth and Oropharynx as per pre-operative assessment

## 2021-02-16 NOTE — Transfer of Care (Signed)
Immediate Anesthesia Transfer of Care Note  Patient: Donald [name]  Procedure(s) Performed: TOTAL HIP ARTHROPLASTY (Right: Hip)  Patient Location: PACU  Anesthesia Type:General  Level of Consciousness: drowsy  Airway & Oxygen Therapy: Patient Spontanous Breathing and Patient connected to face mask oxygen  Post-op Assessment: Report given to RN and Post -op Vital signs reviewed and stable  Post vital signs: Reviewed and stable  Last Vitals:  Vitals Value Taken Time  BP 125/66   Temp    Pulse 91 02/16/21 1534  Resp 24 02/16/21 1534  SpO2 100 % 02/16/21 1534  Vitals shown include unvalidated device data.  Last Pain:  Vitals:   02/16/21 0918  TempSrc: Tympanic  PainSc: 9          Complications: No notable events documented.

## 2021-02-16 NOTE — H&P (Signed)
The patient has been re-examined, and the chart reviewed, and there have been no interval changes to the documented history and physical.    The risks, benefits, and alternatives have been discussed at length. The patient expressed understanding of the risks benefits and agreed with plans for surgical intervention.  Kiora Hallberg P. Murlean Seelye, Jr. M.D.    

## 2021-02-17 ENCOUNTER — Telehealth: Payer: Self-pay

## 2021-02-17 ENCOUNTER — Encounter: Payer: Self-pay | Admitting: Orthopedic Surgery

## 2021-02-17 DIAGNOSIS — E119 Type 2 diabetes mellitus without complications: Secondary | ICD-10-CM | POA: Diagnosis not present

## 2021-02-17 DIAGNOSIS — Z87891 Personal history of nicotine dependence: Secondary | ICD-10-CM | POA: Diagnosis not present

## 2021-02-17 DIAGNOSIS — Z8616 Personal history of COVID-19: Secondary | ICD-10-CM | POA: Diagnosis not present

## 2021-02-17 DIAGNOSIS — Z79899 Other long term (current) drug therapy: Secondary | ICD-10-CM | POA: Diagnosis not present

## 2021-02-17 DIAGNOSIS — Z91040 Latex allergy status: Secondary | ICD-10-CM | POA: Diagnosis not present

## 2021-02-17 DIAGNOSIS — M1611 Unilateral primary osteoarthritis, right hip: Secondary | ICD-10-CM | POA: Diagnosis not present

## 2021-02-17 DIAGNOSIS — I10 Essential (primary) hypertension: Secondary | ICD-10-CM | POA: Diagnosis not present

## 2021-02-17 LAB — GLUCOSE, CAPILLARY
Glucose-Capillary: 124 mg/dL — ABNORMAL HIGH (ref 70–99)
Glucose-Capillary: 113 mg/dL — ABNORMAL HIGH (ref 70–99)
Glucose-Capillary: 132 mg/dL — ABNORMAL HIGH (ref 70–99)
Glucose-Capillary: 104 mg/dL — ABNORMAL HIGH (ref 70–99)

## 2021-02-17 MED ORDER — CELECOXIB 200 MG PO CAPS: 200.0000 mg | ORAL_CAPSULE | Freq: Two times a day (BID) | ORAL | 0 refills | Status: DC

## 2021-02-17 MED ORDER — ENOXAPARIN SODIUM 40 MG/0.4ML IJ SOSY: 40.0000 mg | PREFILLED_SYRINGE | INTRAMUSCULAR | 0 refills | Status: DC

## 2021-02-17 MED ORDER — OXYCODONE HCL 5 MG PO TABS: 5.0000 mg | ORAL_TABLET | ORAL | 0 refills | Status: DC | PRN

## 2021-02-17 NOTE — Progress Notes (Signed)
  Subjective: 1 Day Post-Op Procedure(s) (LRB): TOTAL HIP ARTHROPLASTY (Right) Patient reports pain as moderate.   Patient is well, and has had no acute complaints or problems Plan is to go Home after hospital stay. Negative for chest pain and shortness of breath Fever: no Gastrointestinal: negative for nausea and vomiting.  Patient has not had a bowel movement.  Objective: Vital signs in last 24 hours: Temp:  [97 F (36.1 C)-98.1 F (36.7 C)] 97.9 F (36.6 C) (09/15 0809) Pulse Rate:  [74-91] 74 (09/15 0809) Resp:  [11-24] 15 (09/15 0809) BP: (112-141)/(66-88) 112/70 (09/15 0809) SpO2:  [99 %-100 %] 100 % (09/15 0809)  Intake/Output from previous day:  Intake/Output Summary (Last 24 hours) at 02/17/2021 0925 Last data filed at 02/17/2021 0400 Gross per 24 hour  Intake 1410 ml  Output 1180 ml  Net 230 ml    Intake/Output this shift: No intake/output data recorded.  Labs: No results for input(s): HGB in the last 72 hours. No results for input(s): WBC, RBC, HCT, PLT in the last 72 hours. No results for input(s): NA, K, CL, CO2, BUN, CREATININE, GLUCOSE, CALCIUM in the last 72 hours. No results for input(s): LABPT, INR in the last 72 hours.   EXAM General - Patient is Alert, Appropriate, and Oriented Extremity - Neurovascular intact Dorsiflexion/Plantar flexion intact Compartment soft Dressing/Incision -clean, dry, no drainage, Hemovac in place.  Motor Function - intact, moving foot and toes well on exam.  Cardiovascular- Regular rate and rhythm, no murmurs/rubs/gallops Respiratory- Lungs clear to auscultation bilaterally Gastrointestinal- soft, nontender, and active bowel sounds   Assessment/Plan: 1 Day Post-Op Procedure(s) (LRB): TOTAL HIP ARTHROPLASTY (Right) Active Problems:   Hx of total hip arthroplasty, right  Estimated body mass index is 34.29 kg/m as calculated from the following:   Height as of this encounter: '5\' 10"'$  (1.778 m).   Weight as of this  encounter: 108.4 kg. Advance diet Up with therapy; maintain posterior hip precautions        DVT Prophylaxis - Lovenox, Ted hose, and foot pumps Weight-Bearing as tolerated to right leg  Cassell Smiles, PA-C [facility] Orthopa[facility] Surgery 02/17/2021, 9:25 AM

## 2021-02-17 NOTE — Telephone Encounter (Signed)
Key: [place]

## 2021-02-17 NOTE — Telephone Encounter (Signed)
Approved 06/05/2020-06/04/2021 
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
## 2021-02-17 NOTE — Progress Notes (Signed)
Met with the patient at the bedside to discuss DC plan and needs He lives alone but will have family and friends helping him each day He needs a RW and a 3 in 1, [employer] will bring into the room prior to DC He is set up with [place] for [facility] services He has transportation and we talked about [facility] for possible lower cost meds for his insulin or any other high cost medication, He stated that otherwise he usually can afford his meds, I also encouraged him to check the manufacturer for programs to help with the cost He stated that he has no additional needs

## 2021-02-17 NOTE — Evaluation (Addendum)
Physical Therapy Evaluation Patient Details Name: Donald [name] MRN: [id number] DOB: 09-20-1955 Today's Date: 02/17/2021  History of Present Illness  Pt is a 65 y.o. M s/p R THA. PMH includes HTN, SOB w/ exertion, hepatitis C, neurogenic bladder, cervical fusion, and DM.  Clinical Impression  Pt alert, oriented x 4, seated in recliner beginning of session. Pt states PLOF as independent for all ADLs/IADLs, currently works and drives. Pt able to state 2/3 precautions w/ reminder for 1, able to reiterate end of session without cues.  Pt requires min-guard for sit <> stand transfers w/ RW and demonstrates excellent control stand > sit w/ partial WB through RLE. Ambulated w/ min-guard, RW w/ pt demonstrating excessive trunk flexion, decreased cadence, and walks in a discrete 3-pt gait pattern. Cues for pushing RW and adapting a step-through gait pattern utilized with some improvement in overall gait velocity and pattern. PT to continue training in PM session. Skilled PT intervention is indicated to address deficits in function, mobility, and to return to PLOF as able.  Discharge recommendations are HHPT pending stair training.    Recommendations for follow up therapy are one component of a multi-disciplinary discharge planning process, led by the attending physician.  Recommendations may be updated based on patient status, additional functional criteria and insurance authorization.  Follow Up Recommendations Home health PT    Equipment Recommendations  Rolling walker with 5" wheels;3in1 (PT)    Recommendations for Other Services       Precautions / Restrictions Precautions Precautions: Fall;Posterior Hip Precaution Booklet Issued: Yes (comment) Restrictions Weight Bearing Restrictions: Yes RLE Weight Bearing: Weight bearing as tolerated      Mobility  Bed Mobility     General bed mobility comments: Pt seated in recliner beginning of session    Transfers Overall transfer level: Needs  assistance Equipment used: Rolling walker (2 wheeled) Transfers: Sit to/from Stand Sit to Stand: [place]         General transfer comment: Cues for hand placement excellent stand > sit eccentric control w/ WB through RLE  Ambulation/Gait Ambulation/Gait assistance: Min guard Gait Distance (Feet): 100 Feet Assistive device: Rolling walker (2 wheeled) Gait Pattern/deviations: Trunk flexed;Decreased step length - right;Decreased step length - left;Step-to pattern Gait velocity: Decreased   General Gait Details: Pt encouraged for step-through pattern and demonstrated excessive trunk flexion  Stairs            Wheelchair Mobility    Modified Rankin (Stroke Patients Only)       Balance Overall balance assessment: Needs assistance Sitting-balance support: Feet supported;No upper extremity supported Sitting balance-Leahy Scale: Good     Standing balance support: Bilateral upper extremity supported Standing balance-Leahy Scale: Fair                               Pertinent Vitals/Pain Pain Assessment: 0-10 Pain Score: 3 Pain Location: R hip Pain Descriptors / Indicators: Aching;Sore Pain Intervention(s): Limited activity within patient's tolerance;Repositioned;Monitored during session; Premedicated before session; Pt requesting pain meds-RN notified; Ice applied    Home Living Family/patient expects to be discharged to:: Private residence Living Arrangements: Alone Available Help at Discharge: Friend(s);Family;Available PRN/intermittently Type of Home: House Home Access: Stairs to enter Entrance Stairs-Rails: Right Entrance Stairs-Number of Steps: 7 Home Layout: One level Home Equipment: Grab bars - toilet      Prior Function Level of Independence: Independent         Comments: walks w/o AD, indep  for ADLs/IADLs, drives, and works as an IT sales professional        Extremity/Trunk Assessment   Upper Extremity  Assessment Upper Extremity Assessment: Overall WFL for tasks assessed    Lower Extremity Assessment Lower Extremity Assessment: RLE deficits/detail RLE Deficits / Details:SILT RLE, able to move against gravity for HF, KE, DF RLE Sensation: WNL    Cervical / Trunk Assessment Cervical / Trunk Assessment: Kyphotic  Communication   Communication: No difficulties  Cognition Arousal/Alertness: Awake/alert Behavior During Therapy: WFL for tasks assessed/performed Overall Cognitive Status: Within Functional Limits for tasks assessed                                        General Comments      Exercises Total Joint Exercises Ankle Circles/Pumps: AROM;Both;10 reps;Seated Gluteal Sets: AROM;Both;10 reps;Seated Long Arc Quad: AROM;Both;10 reps;Seated Other Exercises Other Exercises: Pt education: HEP, precautions, safety Other Exercises: Educ re: posterior hip precautions, falls prevention, AE/AD, home modifications   Assessment/Plan    PT Assessment Patient needs continued PT services  PT Problem List Decreased strength;Decreased range of motion;Decreased activity tolerance;Decreased balance;Decreased mobility;Decreased coordination       PT Treatment Interventions Balance training;Gait training;Neuromuscular re-education;Stair training;Functional mobility training;Therapeutic activities;Therapeutic exercise    PT Goals (Current goals can be found in the Care Plan section)  Acute Rehab PT Goals Patient Stated Goal: to go home PT Goal Formulation: With patient Time For Goal Achievement: 03/03/21 Potential to Achieve Goals: Good    Frequency BID   Barriers to discharge        Co-evaluation               AM-PAC PT "6 Clicks" Mobility  Outcome Measure   Help needed moving from lying on your back to sitting on the side of a flat bed without using bedrails?: A Little Help needed moving to and from a bed to a chair (including a wheelchair)?: A  Little Help needed standing up from a chair using your arms (e.g., wheelchair or bedside chair)?: A Little Help needed to walk in hospital room?: A Little Help needed climbing 3-5 steps with a railing? : A Lot 6 Click Score: 14    End of Session Equipment Utilized During Treatment: Gait belt Activity Tolerance: Patient tolerated treatment well Patient left: in chair;with SCD's reapplied;with call bell/phone within reach;with chair alarm set Nurse Communication: Mobility status;Patient requests pain meds PT Visit Diagnosis: Other abnormalities of gait and mobility (R26.89);Muscle weakness (generalized) (M62.81)    Time: [id number] PT Time Calculation (min) (ACUTE ONLY): 43 min   Charges:   PT Evaluation $PT Eval Low Complexity: 1 Low PT Treatments $Gait Training: 8-22 mins $Therapeutic Exercise: 8-22 mins $Therapeutic Activity: 8-22 mins        [employer], SPT

## 2021-02-17 NOTE — Progress Notes (Signed)
Physical Therapy Treatment Patient Details Name: Donald [name] MRN: [id number] DOB: [birth year] Today's Date: 02/17/2021   History of Present Illness Pt is a 65 y.o. M s/p R THA. PMH includes HTN, SOB w/ exertion, hepatitis C, neurogenic bladder, cervical fusion, and DM.    PT Comments    Pt alert, seated in recliner at beginning of session. Pt indicates resting pain levels 6/10, decreased from 7/10 after receiving pain medication. Pt agreeable for further mobility w/ PT this secession. Progressed amb distance w/ RW, min-guard for safety and cues for step-through gait pattern. Pt improved gait velocity and technique following multimodal cues. Transferred w/ BUE on chair to RW, min-guard for safety without LOB. Pt prefers to sit up in recliner following session. Pt education on continuing HEP outside of PT session. Discharge recommendations remain HHPT. Skilled PT intervention is indicated to address deficits in function, mobility, and to return to PLOF as able.     Recommendations for follow up therapy are one component of a multi-disciplinary discharge planning process, led by the attending physician.  Recommendations may be updated based on patient status, additional functional criteria and insurance authorization.  Follow Up Recommendations  Home health PT     Equipment Recommendations  Rolling walker with 5" wheels;3in1 (PT)    Recommendations for Other Services       Precautions / Restrictions Precautions Precautions: Fall;Posterior Hip Restrictions Weight Bearing Restrictions: Yes RLE Weight Bearing: Weight bearing as tolerated     Mobility  Bed Mobility               General bed mobility comments: Pt seated in recliner beginning of session    Transfers Overall transfer level: Needs assistance Equipment used: Rolling walker (2 wheeled) Transfers: Sit to/from Stand Sit to Stand: Min guard            Ambulation/Gait Ambulation/Gait assistance: Min  guard Gait Distance (Feet): 185 Feet Assistive device: Rolling walker (2 wheeled) Gait Pattern/deviations: Step-through pattern;Trunk flexed     General Gait Details: Improvement in step-through pattern with multi-modal cues   Stairs             Wheelchair Mobility    Modified Rankin (Stroke Patients Only)       Balance Overall balance assessment: Needs assistance Sitting-balance support: Feet supported;No upper extremity supported Sitting balance-Leahy Scale: Good     Standing balance support: Bilateral upper extremity supported;During functional activity Standing balance-Leahy Scale: Fair Standing balance comment: reliant on UE support                            Cognition Arousal/Alertness: Awake/alert Behavior During Therapy: WFL for tasks assessed/performed Overall Cognitive Status: Within Functional Limits for tasks assessed                                        Exercises Total Joint Exercises Gluteal Sets: AROM;Both;10 reps;Seated Long Arc Quad: AROM;10 reps;Seated;Right    General Comments        Pertinent Vitals/Pain Pain Assessment: 0-10 Pain Score: 6  Pain Location: R hip Pain Descriptors / Indicators: Aching;Sore Pain Intervention(s): Limited activity within patient's tolerance;Monitored during session;Premedicated before session;Repositioned;Ice applied    Home Living                      Prior Function  PT Goals (current goals can now be found in the care plan section) Progress towards PT goals: Progressing toward goals    Frequency    BID      PT Plan Current plan remains appropriate    Co-evaluation              AM-PAC PT "6 Clicks" Mobility   Outcome Measure  Help needed turning from your back to your side while in a flat bed without using bedrails?: None Help needed moving from lying on your back to sitting on the side of a flat bed without using bedrails?: A  Little Help needed moving to and from a bed to a chair (including a wheelchair)?: A Little Help needed standing up from a chair using your arms (e.g., wheelchair or bedside chair)?: A Little Help needed to walk in hospital room?: A Little Help needed climbing 3-5 steps with a railing? : A Lot 6 Click Score: 18    End of Session Equipment Utilized During Treatment: Gait belt Activity Tolerance: Patient tolerated treatment well Patient left: in chair;with SCD's reapplied;with call bell/phone within reach;with chair alarm set Nurse Communication: Mobility status PT Visit Diagnosis: Other abnormalities of gait and mobility (R26.89);Muscle weakness (generalized) (M62.81)     Time: [id number] PT Time Calculation (min) (ACUTE ONLY): 24 min  Charges:                        [employer], SPT

## 2021-02-17 NOTE — Evaluation (Signed)
Occupational Therapy Evaluation Patient Details Name: Donald [name] MRN: [id number] DOB: 08-Sep-1955 Today's Date: 02/17/2021   History of Present Illness Pt is a 65 y.o. M s/p R THA. PMH includes HTN, SOB w/ exertion, hepatitis C, neurogenic bladder, cervical fusion, and DM.   Clinical Impression   Donald [name] received today in recliner, POD # 1 s/p R THA. He had just finished PT and reports 9/10 pain at present. He lives alone, in a 1-story home, 7 STE. Prior to admission, he was IND in ADL/IADL, works full-time, drives, ambulates w/o AD. Pt currently requires Max A for LBD, Min A for transfers. Pt instructed in posterior total hip precautions and self care skills, falls prevention strategies, home/routines modifications, DME/AE for LB bathing and dressing tasks, and car transfer techniques. Because of pt's high level of pain, he declined attempting bed mobility and toilet transfers at present. At end of session, pt able to recall 3/3 posterior total hip precautions. Prior to discharge, pt would benefit from additional instruction in self care skills and techniques, practice in supine<sit<stand, and transfer to/from toilet. Recommend HHOT post DC.     Recommendations for follow up therapy are one component of a multi-disciplinary discharge planning process, led by the attending physician.  Recommendations may be updated based on patient status, additional functional criteria and insurance authorization.   Follow Up Recommendations  Home health OT    Equipment Recommendations  3 in 1 bedside commode;Other (comment) (RW)    Recommendations for Other Services       Precautions / Restrictions Precautions Precautions: Fall;Posterior Hip Precaution Booklet Issued: Yes (comment) Restrictions Weight Bearing Restrictions: Yes RLE Weight Bearing: Weight bearing as tolerated      Mobility Bed Mobility               General bed mobility comments: Pt seated in recliner beginning of  session    Transfers Overall transfer level: Needs assistance Equipment used: Rolling walker (2 wheeled) Transfers: Sit to/from Stand Sit to Stand: Min assist         General transfer comment: pt reports increased pain with sit<>stand    Balance Overall balance assessment: Needs assistance Sitting-balance support: Feet supported;No upper extremity supported Sitting balance-Leahy Scale: Good     Standing balance support: Bilateral upper extremity supported Standing balance-Leahy Scale: Fair                             ADL either performed or assessed with clinical judgement   ADL Overall ADL's : Needs assistance/impaired                     Lower Body Dressing: Maximal assistance                       Vision         Perception     Praxis      Pertinent Vitals/Pain Pain Assessment: 0-10 Pain Score: 9  Pain Location: R hip Pain Descriptors / Indicators: Aching;Sore Pain Intervention(s): Limited activity within patient's tolerance;Repositioned;Monitored during session     Hand Dominance     Extremity/Trunk Assessment Upper Extremity Assessment Upper Extremity Assessment: Overall WFL for tasks assessed   Lower Extremity Assessment Lower Extremity Assessment: RLE deficits/detail RLE Deficits / Details: s/p R THA RLE: Unable to fully assess due to pain RLE Sensation: WNL   Cervical / Trunk Assessment Cervical / Trunk Assessment: Kyphotic  Communication Communication Communication: No difficulties   Cognition Arousal/Alertness: Awake/alert Behavior During Therapy: WFL for tasks assessed/performed Overall Cognitive Status: Within Functional Limits for tasks assessed                                     General Comments       Exercises Total Joint Exercises Ankle Circles/Pumps: AROM;Both;10 reps;Seated Gluteal Sets: AROM;Both;10 reps;Seated Long Arc Quad: AROM;Both;10 reps;Seated Other Exercises Other  Exercises: Pt education: HEP, precautions, safety Other Exercises: Educ re: posterior hip precautions, falls prevention, AE/AD, home modifications   Shoulder Instructions      Home Living Family/patient expects to be discharged to:: Private residence Living Arrangements: Alone Available Help at Discharge: Friend(s);Family;Available PRN/intermittently Type of Home: House Home Access: Stairs to enter [employer] of Steps: 7 Entrance Stairs-Rails: Right Home Layout: One level     Bathroom Shower/Tub: Walk-in shower         Home Equipment: Grab bars - toilet          Prior Functioning/Environment Level of Independence: Independent        Comments: walks w/o AD, indep for ADLs/IADLs, drives, and works as an [employer] certified accountant        OT Problem List: Decreased strength;Impaired balance (sitting and/or standing);Pain;Decreased range of motion;Decreased activity tolerance;Decreased knowledge of use of DME or AE      OT Treatment/Interventions: Self-care/ADL training;DME and/or AE instruction;Therapeutic activities;Balance training;Therapeutic exercise;Patient/family education;Energy conservation    OT Goals(Current goals can be found in the care plan section) Acute Rehab OT Goals Patient Stated Goal: to go home OT Goal Formulation: With patient Time For Goal Achievement: 03/03/21 Potential to Achieve Goals: Good ADL Goals Pt Will Perform Lower Body Bathing: with modified independence;sitting/lateral leans (while adhering to posterior hip precautions) Pt Will Perform Lower Body Dressing: with modified independence;sit to/from stand (while adhering to hip precautions, using AE as needed) Pt Will Transfer to Toilet: with modified independence;stand pivot transfer (using LRAD, while adhering to hip precautions)  OT Frequency: Min 1X/week   Barriers to D/C: Decreased caregiver support  Pt lives alone; will have support PRN       Co-evaluation               AM-PAC OT "6 Clicks" Daily Activity     Outcome Measure Help from another person eating meals?: None Help from another person taking care of personal grooming?: A Little Help from another person toileting, which includes using toliet, bedpan, or urinal?: A Lot Help from another person bathing (including washing, rinsing, drying)?: A Little Help from another person to put on and taking off regular upper body clothing?: None Help from another person to put on and taking off regular lower body clothing?: A Lot 6 Click Score: 18   End of Session Equipment Utilized During Treatment: Rolling walker  Activity Tolerance: Patient tolerated treatment well;Patient limited by pain Patient left: in chair;with call bell/phone within reach  OT Visit Diagnosis: Unsteadiness on feet (R26.81);Muscle weakness (generalized) (M62.81);Pain Pain - Right/Left: Right Pain - part of body: Hip                Time: [id number] OT Time Calculation (min): 24 min Charges:  OT General Charges $OT Visit: 1 Visit OT Evaluation $OT Eval Low Complexity: 1 Low OT Treatments $Self Care/Home Management : 23-37 mins Josiah Lobo, PhD, MS, OTR/L 02/17/21, 11:16 AM

## 2021-02-18 DIAGNOSIS — Z79899 Other long term (current) drug therapy: Secondary | ICD-10-CM | POA: Diagnosis not present

## 2021-02-18 DIAGNOSIS — Z91040 Latex allergy status: Secondary | ICD-10-CM | POA: Diagnosis not present

## 2021-02-18 DIAGNOSIS — Z87891 Personal history of nicotine dependence: Secondary | ICD-10-CM | POA: Diagnosis not present

## 2021-02-18 DIAGNOSIS — M1611 Unilateral primary osteoarthritis, right hip: Secondary | ICD-10-CM | POA: Diagnosis not present

## 2021-02-18 DIAGNOSIS — E119 Type 2 diabetes mellitus without complications: Secondary | ICD-10-CM | POA: Diagnosis not present

## 2021-02-18 DIAGNOSIS — Z8616 Personal history of COVID-19: Secondary | ICD-10-CM | POA: Diagnosis not present

## 2021-02-18 DIAGNOSIS — I10 Essential (primary) hypertension: Secondary | ICD-10-CM | POA: Diagnosis not present

## 2021-02-18 DIAGNOSIS — M8949 Other hypertrophic osteoarthropathy, multiple sites: Secondary | ICD-10-CM | POA: Diagnosis not present

## 2021-02-18 DIAGNOSIS — G4733 Obstructive sleep apnea (adult) (pediatric): Secondary | ICD-10-CM | POA: Diagnosis not present

## 2021-02-18 DIAGNOSIS — R269 Unspecified abnormalities of gait and mobility: Secondary | ICD-10-CM | POA: Diagnosis not present

## 2021-02-18 LAB — SURGICAL PATHOLOGY

## 2021-02-18 LAB — GLUCOSE, CAPILLARY: Glucose-Capillary: 91 mg/dL (ref 70–99)

## 2021-02-18 NOTE — Progress Notes (Addendum)
Physical Therapy Treatment Patient Details Name: Donald [name] MRN: [id number] DOB: [birth year] Today's Date: 02/18/2021   History of Present Illness Pt is a 65 y.o. M s/p R THA. PMH includes HTN, SOB w/ exertion, hepatitis C, neurogenic bladder, cervical fusion, and DM.     PT Comments    Pt alert, states resting pain at 7-8/10 but willing to work with PT without pain medication, meds provided after session. Pt continues to demonstrate good sit <> stand control w/ min-guard for safety. Progressed treatment to incorporate stair training w/ R hand rail x 4 steps w/ BUE, min-guard and 4 steps w/ 2 hand rails. Pt demonstrated safe navigation while maintaining posterior hip precautions without LOB or instability. Skilled PT intervention is indicated to address deficits in function, mobility, and to return to PLOF as able.  Discharge recommendations remain HHPT.   Recommendations for follow up therapy are one component of a multi-disciplinary discharge planning process, led by the attending physician.  Recommendations may be updated based on patient status, additional functional criteria and insurance authorization.  Follow Up Recommendations  Home health PT     Equipment Recommendations  Rolling walker with 5" wheels;3in1 (PT)    Recommendations for Other Services       Precautions / Restrictions Precautions Precautions: Fall;Posterior Hip Restrictions Weight Bearing Restrictions: Yes RLE Weight Bearing: Weight bearing as tolerated     Mobility  Bed Mobility Overal bed mobility: Modified Independent             General bed mobility comments: Pt seated in recliner beginning of session    Transfers Overall transfer level: Needs assistance (Simultaneous filing. User may not have seen previous data.) Equipment used: Rolling walker (2 wheeled) (Simultaneous filing. User may not have seen previous data.) Transfers: Sit to/from Stand (Simultaneous filing. User may not have seen  previous data.) Sit to Stand: Min guard (Simultaneous filing. User may not have seen previous data.)         General transfer comment: Good control eccentric & concentrically (Simultaneous filing. User may not have seen previous data.)  Ambulation/Gait Ambulation/Gait assistance: Min guard Gait Distance (Feet): 160 Feet Assistive device: Rolling walker (2 wheeled) Gait Pattern/deviations: Step-through pattern;Trunk flexed         Stairs Stairs: Yes Stairs assistance: Min guard Stair Management: One rail Right Number of Stairs: 8 General stair comments: 4 stairs w/ 2 rails and 4 stairs w/ R hand rail w/ BUE support, min-gaurd for safety   Wheelchair Mobility    Modified Rankin (Stroke Patients Only)       Balance Overall balance assessment: Needs assistance Sitting-balance support: Feet supported;No upper extremity supported Sitting balance-Leahy Scale: Good     Standing balance support: Bilateral upper extremity supported;During functional activity Standing balance-Leahy Scale: Good Standing balance comment: reliant on UE support                            Cognition Arousal/Alertness: Awake/alert Behavior During Therapy: WFL for tasks assessed/performed Overall Cognitive Status: Within Functional Limits for tasks assessed                                        Exercises Other Exercises Other Exercises: Educ re: posterior hip precautions, falls prevention, AE/AD, home modifications    General Comments        Pertinent Vitals/Pain Pain Assessment: 0-10  Pain Score: 7  Pain Location: R hip Pain Descriptors / Indicators: Aching;Sore Pain Intervention(s): Premedicated before session;Monitored during session;Limited activity within patient's tolerance;Repositioned    Home Living                      Prior Function            PT Goals (current goals can now be found in the care plan section) Acute Rehab PT  Goals Patient Stated Goal: to go home Progress towards PT goals: Progressing toward goals    Frequency    BID      PT Plan Current plan remains appropriate    Co-evaluation              AM-PAC PT "6 Clicks" Mobility   Outcome Measure  Help needed turning from your back to your side while in a flat bed without using bedrails?: None Help needed moving from lying on your back to sitting on the side of a flat bed without using bedrails?: A Little Help needed moving to and from a bed to a chair (including a wheelchair)?: A Little Help needed standing up from a chair using your arms (e.g., wheelchair or bedside chair)?: A Little Help needed to walk in hospital room?: A Little Help needed climbing 3-5 steps with a railing? : A Little 6 Click Score: 19    End of Session Equipment Utilized During Treatment: Gait belt Activity Tolerance: Patient tolerated treatment well Patient left: in chair;with call bell/phone within reach;with chair alarm set Nurse Communication: Mobility status PT Visit Diagnosis: Other abnormalities of gait and mobility (R26.89);Muscle weakness (generalized) (M62.81)     Time: [id number] PT Time Calculation (min) (ACUTE ONLY): 35 min  Charges:                        [employer], SPT

## 2021-02-18 NOTE — Anesthesia Postprocedure Evaluation (Signed)
Anesthesia Post Note  Patient: SABDIEL KERKSTRA  Procedure(s) Performed: TOTAL HIP ARTHROPLASTY (Right: Hip)  Patient location during evaluation: PACU Anesthesia Type: General Level of consciousness: awake and alert Pain management: pain level controlled Vital Signs Assessment: post-procedure vital signs reviewed and stable Respiratory status: spontaneous breathing, nonlabored ventilation, respiratory function stable and patient connected to nasal cannula oxygen Cardiovascular status: blood pressure returned to baseline and stable Postop Assessment: no apparent nausea or vomiting Anesthetic complications: no   No notable events documented.   Last Vitals:  Vitals:   02/18/21 0400 02/18/21 0818  BP: 110/68 127/87  Pulse: 75 91  Resp:  17  Temp: 37.1 C 37 C  SpO2: 100% 100%    Last Pain:  Vitals:   02/18/21 0818  TempSrc: Oral  PainSc:                  Molli Barrows

## 2021-02-18 NOTE — Progress Notes (Signed)
Occupational Therapy Treatment Patient Details Name: Donald [name] MRN: [id number] DOB: 1955/11/25 Today's Date: 02/18/2021   History of present illness Pt is a 65 y.o. M s/p R THA. PMH includes HTN, SOB w/ exertion, hepatitis C, neurogenic bladder, cervical fusion, and DM. (Simultaneous filing. User may not have seen previous data.)   OT comments  Mr. Cichowski continues to be somewhat limited by pain (7/10 today), but is able to perform fxl mobility tasks with Mod I-SUPV, requiring increased time, but demonstrating good safety awareness. During today's session, reinforced education re: hip precautions, home modifications, transfer techniques, bed mobility, AD/AE. Recommend HHOT post DC, given pt's pain, slow movements, limited support/supervision at home.    Recommendations for follow up therapy are one component of a multi-disciplinary discharge planning process, led by the attending physician.  Recommendations may be updated based on patient status, additional functional criteria and insurance authorization.    Follow Up Recommendations  Home health OT    Equipment Recommendations  3 in 1 bedside commode;Other (comment) (RW)    Recommendations for Other Services      Precautions / Restrictions Precautions Precautions: Fall;Posterior Hip Restrictions Weight Bearing Restrictions: Yes RLE Weight Bearing: Weight bearing as tolerated       Mobility Bed Mobility Overal bed mobility: Modified Independent             General bed mobility comments: Pt seated in recliner beginning of session    Transfers Overall transfer level: Needs assistance (Simultaneous filing. User may not have seen previous data.) Equipment used: Rolling walker (2 wheeled) (Simultaneous filing. User may not have seen previous data.) Transfers: Sit to/from Stand (Simultaneous filing. User may not have seen previous data.) Sit to Stand: Min guard (Simultaneous filing. User may not have seen previous data.)          General transfer comment: Good control eccentric & concentrically (Simultaneous filing. User may not have seen previous data.)    Balance Overall balance assessment: Needs assistance Sitting-balance support: Feet supported;No upper extremity supported Sitting balance-Leahy Scale: Good     Standing balance support: Bilateral upper extremity supported;During functional activity Standing balance-Leahy Scale: Good Standing balance comment: reliant on UE support                           ADL either performed or assessed with clinical judgement   ADL                                               Vision       Perception     Praxis      Cognition Arousal/Alertness: Awake/alert Behavior During Therapy: WFL for tasks assessed/performed Overall Cognitive Status: Within Functional Limits for tasks assessed                                          Exercises Other Exercises Other Exercises: Educ re: posterior hip precautions, falls prevention, AE/AD, home modifications   Shoulder Instructions       General Comments      Pertinent Vitals/ Pain       Pain Assessment: 0-10 Pain Score: 7  Pain Location: R hip Pain Descriptors / Indicators: Aching;Sore Pain Intervention(s): Premedicated before session;Monitored during session;Limited  activity within patient's tolerance;Repositioned  Home Living                                          Prior Functioning/Environment              Frequency  Min 1X/week        Progress Toward Goals  OT Goals(current goals can now be found in the care plan section)  Progress towards OT goals: Progressing toward goals  Acute Rehab OT Goals Patient Stated Goal: to go home OT Goal Formulation: With patient Time For Goal Achievement: 03/03/21 Potential to Achieve Goals: Good  Plan Discharge plan remains appropriate;Frequency remains appropriate     Co-evaluation                 AM-PAC OT "6 Clicks" Daily Activity     Outcome Measure   Help from another person eating meals?: None Help from another person taking care of personal grooming?: A Little Help from another person toileting, which includes using toliet, bedpan, or urinal?: A Little Help from another person bathing (including washing, rinsing, drying)?: A Little Help from another person to put on and taking off regular upper body clothing?: None Help from another person to put on and taking off regular lower body clothing?: A Lot 6 Click Score: 19    End of Session Equipment Utilized During Treatment: Rolling walker  OT Visit Diagnosis: Unsteadiness on feet (R26.81);Muscle weakness (generalized) (M62.81);Pain Pain - Right/Left: Right Pain - part of body: Hip   Activity Tolerance Patient tolerated treatment well;Patient limited by pain   Patient Left in chair;with call bell/phone within reach;with chair alarm set   Nurse Communication          Time: [id number] OT Time Calculation (min): 10 min  Charges: OT General Charges $OT Visit: 1 Visit OT Treatments $Self Care/Home Management : 8-22 mins  Josiah Lobo, PhD, MS, OTR/L 02/18/21, 12:48 PM

## 2021-02-18 NOTE — Progress Notes (Signed)
Lovenox teaching done with pt

## 2021-02-18 NOTE — Progress Notes (Signed)
DISCHARGE NOTE:  Pt given discharge instructions. Pt verbalized understanding. TED hose on both legs. Extra honeycomb dressing, BSC and walker sent with pt. Pt wheeled to car by staff. Friend providing transportation.

## 2021-02-18 NOTE — Progress Notes (Signed)
  Subjective: 2 Days Post-Op Procedure(s) (LRB): TOTAL HIP ARTHROPLASTY (Right) Patient reports pain as mild.   Patient is well, and has had no acute complaints or problems Plan is to go Home after hospital stay. Negative for chest pain and shortness of breath Fever: no Gastrointestinal: negative for nausea and vomiting.  Patient has had a bowel movement.  Objective: Vital signs in last 24 hours: Temp:  [98 F (36.7 C)-98.7 F (37.1 C)] 98.7 F (37.1 C) (09/16 0400) Pulse Rate:  [71-75] 75 (09/16 0400) Resp:  [16] 16 (09/15 1605) BP: (107-110)/(68-70) 110/68 (09/16 0400) SpO2:  [100 %] 100 % (09/16 0400)  Intake/Output from previous day:  Intake/Output Summary (Last 24 hours) at 02/18/2021 0813 Last data filed at 02/17/2021 2053 Gross per 24 hour  Intake --  Output 400 ml  Net -400 ml    Intake/Output this shift: No intake/output data recorded.  Labs: No results for input(s): HGB in the last 72 hours. No results for input(s): WBC, RBC, HCT, PLT in the last 72 hours. No results for input(s): NA, K, CL, CO2, BUN, CREATININE, GLUCOSE, CALCIUM in the last 72 hours. No results for input(s): LABPT, INR in the last 72 hours.   EXAM General - Patient is Alert, Appropriate, and Oriented Extremity - Neurovascular intact Dorsiflexion/Plantar flexion intact Compartment soft Dressing/Incision -clean, dry, no drainage, Hemovac in place and removed this am with no complications Motor Function - intact, moving foot and toes well on exam.    Assessment/Plan: 2 Days Post-Op Procedure(s) (LRB): TOTAL HIP ARTHROPLASTY (Right) Active Problems:   Hx of total hip arthroplasty, right  Estimated body mass index is 34.29 kg/m as calculated from the following:   Height as of this encounter: '5\' 10"'$  (1.778 m).   Weight as of this encounter: 108.4 kg. Advance diet Up with therapy; maintain posterior hip precautions  Pain well controlled Vital signs are stable Patient doing well with no  complaints. Plan on discharge to home with home health PT today.     DVT Prophylaxis - Lovenox, Ted hose, and foot pumps Weight-Bearing as tolerated to right leg   T. Rachelle Hora, PA-C [facility] 02/18/2021, 8:13 AM

## 2021-02-19 DIAGNOSIS — G4733 Obstructive sleep apnea (adult) (pediatric): Secondary | ICD-10-CM | POA: Diagnosis not present

## 2021-02-19 DIAGNOSIS — Z96641 Presence of right artificial hip joint: Secondary | ICD-10-CM | POA: Diagnosis not present

## 2021-02-19 DIAGNOSIS — G4731 Primary central sleep apnea: Secondary | ICD-10-CM | POA: Diagnosis not present

## 2021-02-19 DIAGNOSIS — E119 Type 2 diabetes mellitus without complications: Secondary | ICD-10-CM | POA: Diagnosis not present

## 2021-02-19 DIAGNOSIS — I119 Hypertensive heart disease without heart failure: Secondary | ICD-10-CM | POA: Diagnosis not present

## 2021-02-19 DIAGNOSIS — R350 Frequency of micturition: Secondary | ICD-10-CM | POA: Diagnosis not present

## 2021-02-19 DIAGNOSIS — Z471 Aftercare following joint replacement surgery: Secondary | ICD-10-CM | POA: Diagnosis not present

## 2021-02-19 DIAGNOSIS — N401 Enlarged prostate with lower urinary tract symptoms: Secondary | ICD-10-CM | POA: Diagnosis not present

## 2021-02-19 DIAGNOSIS — E785 Hyperlipidemia, unspecified: Secondary | ICD-10-CM | POA: Diagnosis not present

## 2021-02-23 DIAGNOSIS — G4731 Primary central sleep apnea: Secondary | ICD-10-CM | POA: Diagnosis not present

## 2021-02-23 DIAGNOSIS — G4733 Obstructive sleep apnea (adult) (pediatric): Secondary | ICD-10-CM | POA: Diagnosis not present

## 2021-02-23 DIAGNOSIS — R350 Frequency of micturition: Secondary | ICD-10-CM | POA: Diagnosis not present

## 2021-02-23 DIAGNOSIS — E119 Type 2 diabetes mellitus without complications: Secondary | ICD-10-CM | POA: Diagnosis not present

## 2021-02-23 DIAGNOSIS — N401 Enlarged prostate with lower urinary tract symptoms: Secondary | ICD-10-CM | POA: Diagnosis not present

## 2021-02-23 DIAGNOSIS — I119 Hypertensive heart disease without heart failure: Secondary | ICD-10-CM | POA: Diagnosis not present

## 2021-02-23 DIAGNOSIS — Z471 Aftercare following joint replacement surgery: Secondary | ICD-10-CM | POA: Diagnosis not present

## 2021-02-23 DIAGNOSIS — Z96641 Presence of right artificial hip joint: Secondary | ICD-10-CM | POA: Diagnosis not present

## 2021-02-23 DIAGNOSIS — E785 Hyperlipidemia, unspecified: Secondary | ICD-10-CM | POA: Diagnosis not present

## 2021-02-25 DIAGNOSIS — I119 Hypertensive heart disease without heart failure: Secondary | ICD-10-CM | POA: Diagnosis not present

## 2021-02-25 DIAGNOSIS — R350 Frequency of micturition: Secondary | ICD-10-CM | POA: Diagnosis not present

## 2021-02-25 DIAGNOSIS — E785 Hyperlipidemia, unspecified: Secondary | ICD-10-CM | POA: Diagnosis not present

## 2021-02-25 DIAGNOSIS — G4731 Primary central sleep apnea: Secondary | ICD-10-CM | POA: Diagnosis not present

## 2021-02-25 DIAGNOSIS — Z96641 Presence of right artificial hip joint: Secondary | ICD-10-CM | POA: Diagnosis not present

## 2021-02-25 DIAGNOSIS — E119 Type 2 diabetes mellitus without complications: Secondary | ICD-10-CM | POA: Diagnosis not present

## 2021-02-25 DIAGNOSIS — Z471 Aftercare following joint replacement surgery: Secondary | ICD-10-CM | POA: Diagnosis not present

## 2021-02-25 DIAGNOSIS — N401 Enlarged prostate with lower urinary tract symptoms: Secondary | ICD-10-CM | POA: Diagnosis not present

## 2021-02-25 DIAGNOSIS — G4733 Obstructive sleep apnea (adult) (pediatric): Secondary | ICD-10-CM | POA: Diagnosis not present

## 2021-03-01 DIAGNOSIS — I119 Hypertensive heart disease without heart failure: Secondary | ICD-10-CM | POA: Diagnosis not present

## 2021-03-01 DIAGNOSIS — G4731 Primary central sleep apnea: Secondary | ICD-10-CM | POA: Diagnosis not present

## 2021-03-01 DIAGNOSIS — Z96641 Presence of right artificial hip joint: Secondary | ICD-10-CM | POA: Diagnosis not present

## 2021-03-01 DIAGNOSIS — E119 Type 2 diabetes mellitus without complications: Secondary | ICD-10-CM | POA: Diagnosis not present

## 2021-03-01 DIAGNOSIS — N401 Enlarged prostate with lower urinary tract symptoms: Secondary | ICD-10-CM | POA: Diagnosis not present

## 2021-03-01 DIAGNOSIS — E785 Hyperlipidemia, unspecified: Secondary | ICD-10-CM | POA: Diagnosis not present

## 2021-03-01 DIAGNOSIS — Z471 Aftercare following joint replacement surgery: Secondary | ICD-10-CM | POA: Diagnosis not present

## 2021-03-01 DIAGNOSIS — G4733 Obstructive sleep apnea (adult) (pediatric): Secondary | ICD-10-CM | POA: Diagnosis not present

## 2021-03-01 DIAGNOSIS — R350 Frequency of micturition: Secondary | ICD-10-CM | POA: Diagnosis not present

## 2021-03-02 DIAGNOSIS — Z471 Aftercare following joint replacement surgery: Secondary | ICD-10-CM | POA: Diagnosis not present

## 2021-03-03 DIAGNOSIS — R350 Frequency of micturition: Secondary | ICD-10-CM | POA: Diagnosis not present

## 2021-03-03 DIAGNOSIS — E119 Type 2 diabetes mellitus without complications: Secondary | ICD-10-CM | POA: Diagnosis not present

## 2021-03-03 DIAGNOSIS — I119 Hypertensive heart disease without heart failure: Secondary | ICD-10-CM | POA: Diagnosis not present

## 2021-03-03 DIAGNOSIS — G4733 Obstructive sleep apnea (adult) (pediatric): Secondary | ICD-10-CM | POA: Diagnosis not present

## 2021-03-03 DIAGNOSIS — Z471 Aftercare following joint replacement surgery: Secondary | ICD-10-CM | POA: Diagnosis not present

## 2021-03-03 DIAGNOSIS — G4731 Primary central sleep apnea: Secondary | ICD-10-CM | POA: Diagnosis not present

## 2021-03-03 DIAGNOSIS — Z96641 Presence of right artificial hip joint: Secondary | ICD-10-CM | POA: Diagnosis not present

## 2021-03-03 DIAGNOSIS — E785 Hyperlipidemia, unspecified: Secondary | ICD-10-CM | POA: Diagnosis not present

## 2021-03-03 DIAGNOSIS — N401 Enlarged prostate with lower urinary tract symptoms: Secondary | ICD-10-CM | POA: Diagnosis not present

## 2021-03-03 NOTE — Progress Notes (Signed)
IT ticket is needed for this one.

## 2021-03-07 DIAGNOSIS — E119 Type 2 diabetes mellitus without complications: Secondary | ICD-10-CM | POA: Diagnosis not present

## 2021-03-07 DIAGNOSIS — E785 Hyperlipidemia, unspecified: Secondary | ICD-10-CM | POA: Diagnosis not present

## 2021-03-07 DIAGNOSIS — I119 Hypertensive heart disease without heart failure: Secondary | ICD-10-CM | POA: Diagnosis not present

## 2021-03-07 DIAGNOSIS — G4733 Obstructive sleep apnea (adult) (pediatric): Secondary | ICD-10-CM | POA: Diagnosis not present

## 2021-03-07 DIAGNOSIS — G4731 Primary central sleep apnea: Secondary | ICD-10-CM | POA: Diagnosis not present

## 2021-03-07 DIAGNOSIS — N401 Enlarged prostate with lower urinary tract symptoms: Secondary | ICD-10-CM | POA: Diagnosis not present

## 2021-03-07 DIAGNOSIS — Z96641 Presence of right artificial hip joint: Secondary | ICD-10-CM | POA: Diagnosis not present

## 2021-03-07 DIAGNOSIS — Z471 Aftercare following joint replacement surgery: Secondary | ICD-10-CM | POA: Diagnosis not present

## 2021-03-07 DIAGNOSIS — R350 Frequency of micturition: Secondary | ICD-10-CM | POA: Diagnosis not present

## 2021-03-09 DIAGNOSIS — E119 Type 2 diabetes mellitus without complications: Secondary | ICD-10-CM | POA: Diagnosis not present

## 2021-03-09 DIAGNOSIS — R350 Frequency of micturition: Secondary | ICD-10-CM | POA: Diagnosis not present

## 2021-03-09 DIAGNOSIS — Z471 Aftercare following joint replacement surgery: Secondary | ICD-10-CM | POA: Diagnosis not present

## 2021-03-09 DIAGNOSIS — G4731 Primary central sleep apnea: Secondary | ICD-10-CM | POA: Diagnosis not present

## 2021-03-09 DIAGNOSIS — N401 Enlarged prostate with lower urinary tract symptoms: Secondary | ICD-10-CM | POA: Diagnosis not present

## 2021-03-09 DIAGNOSIS — E785 Hyperlipidemia, unspecified: Secondary | ICD-10-CM | POA: Diagnosis not present

## 2021-03-09 DIAGNOSIS — I119 Hypertensive heart disease without heart failure: Secondary | ICD-10-CM | POA: Diagnosis not present

## 2021-03-09 DIAGNOSIS — G4733 Obstructive sleep apnea (adult) (pediatric): Secondary | ICD-10-CM | POA: Diagnosis not present

## 2021-03-09 DIAGNOSIS — Z96641 Presence of right artificial hip joint: Secondary | ICD-10-CM | POA: Diagnosis not present

## 2021-03-14 DIAGNOSIS — Z96641 Presence of right artificial hip joint: Secondary | ICD-10-CM | POA: Diagnosis not present

## 2021-03-14 DIAGNOSIS — G4731 Primary central sleep apnea: Secondary | ICD-10-CM | POA: Diagnosis not present

## 2021-03-14 DIAGNOSIS — G4733 Obstructive sleep apnea (adult) (pediatric): Secondary | ICD-10-CM | POA: Diagnosis not present

## 2021-03-14 DIAGNOSIS — E785 Hyperlipidemia, unspecified: Secondary | ICD-10-CM | POA: Diagnosis not present

## 2021-03-14 DIAGNOSIS — E119 Type 2 diabetes mellitus without complications: Secondary | ICD-10-CM | POA: Diagnosis not present

## 2021-03-14 DIAGNOSIS — R350 Frequency of micturition: Secondary | ICD-10-CM | POA: Diagnosis not present

## 2021-03-14 DIAGNOSIS — I119 Hypertensive heart disease without heart failure: Secondary | ICD-10-CM | POA: Diagnosis not present

## 2021-03-14 DIAGNOSIS — Z471 Aftercare following joint replacement surgery: Secondary | ICD-10-CM | POA: Diagnosis not present

## 2021-03-14 DIAGNOSIS — N401 Enlarged prostate with lower urinary tract symptoms: Secondary | ICD-10-CM | POA: Diagnosis not present

## 2021-03-17 DIAGNOSIS — Z471 Aftercare following joint replacement surgery: Secondary | ICD-10-CM | POA: Diagnosis not present

## 2021-03-17 DIAGNOSIS — N401 Enlarged prostate with lower urinary tract symptoms: Secondary | ICD-10-CM | POA: Diagnosis not present

## 2021-03-17 DIAGNOSIS — R350 Frequency of micturition: Secondary | ICD-10-CM | POA: Diagnosis not present

## 2021-03-17 DIAGNOSIS — E785 Hyperlipidemia, unspecified: Secondary | ICD-10-CM | POA: Diagnosis not present

## 2021-03-17 DIAGNOSIS — G4731 Primary central sleep apnea: Secondary | ICD-10-CM | POA: Diagnosis not present

## 2021-03-17 DIAGNOSIS — G4733 Obstructive sleep apnea (adult) (pediatric): Secondary | ICD-10-CM | POA: Diagnosis not present

## 2021-03-17 DIAGNOSIS — Z96641 Presence of right artificial hip joint: Secondary | ICD-10-CM | POA: Diagnosis not present

## 2021-03-17 DIAGNOSIS — E119 Type 2 diabetes mellitus without complications: Secondary | ICD-10-CM | POA: Diagnosis not present

## 2021-03-17 DIAGNOSIS — I119 Hypertensive heart disease without heart failure: Secondary | ICD-10-CM | POA: Diagnosis not present

## 2021-03-21 ENCOUNTER — Other Ambulatory Visit: Payer: Self-pay

## 2021-03-21 DIAGNOSIS — E119 Type 2 diabetes mellitus without complications: Secondary | ICD-10-CM

## 2021-03-21 DIAGNOSIS — I119 Hypertensive heart disease without heart failure: Secondary | ICD-10-CM | POA: Diagnosis not present

## 2021-03-21 DIAGNOSIS — E785 Hyperlipidemia, unspecified: Secondary | ICD-10-CM | POA: Diagnosis not present

## 2021-03-21 DIAGNOSIS — R350 Frequency of micturition: Secondary | ICD-10-CM | POA: Diagnosis not present

## 2021-03-21 DIAGNOSIS — Z96641 Presence of right artificial hip joint: Secondary | ICD-10-CM | POA: Diagnosis not present

## 2021-03-21 DIAGNOSIS — N401 Enlarged prostate with lower urinary tract symptoms: Secondary | ICD-10-CM | POA: Diagnosis not present

## 2021-03-21 DIAGNOSIS — G4733 Obstructive sleep apnea (adult) (pediatric): Secondary | ICD-10-CM | POA: Diagnosis not present

## 2021-03-21 DIAGNOSIS — G4731 Primary central sleep apnea: Secondary | ICD-10-CM | POA: Diagnosis not present

## 2021-03-21 DIAGNOSIS — Z471 Aftercare following joint replacement surgery: Secondary | ICD-10-CM | POA: Diagnosis not present

## 2021-03-21 MED ORDER — TIRZEPATIDE 5 MG/0.5ML ~~LOC~~ SOAJ: 5.0000 mg | SUBCUTANEOUS | 1 refills | Status: DC

## 2021-03-25 ENCOUNTER — Emergency Department (HOSPITAL_COMMUNITY)
Admission: EM | Admit: 2021-03-25 | Discharge: 2021-03-25 | Disposition: A | Discharge status: Home / Self Care | Payer: Medicare HMO | Attending: Emergency Medicine | Admitting: Emergency Medicine

## 2021-03-25 ENCOUNTER — Emergency Department (HOSPITAL_COMMUNITY): Payer: Medicare HMO

## 2021-03-25 DIAGNOSIS — E119 Type 2 diabetes mellitus without complications: Secondary | ICD-10-CM | POA: Diagnosis not present

## 2021-03-25 DIAGNOSIS — Z91040 Latex allergy status: Secondary | ICD-10-CM | POA: Insufficient documentation

## 2021-03-25 DIAGNOSIS — N200 Calculus of kidney: Secondary | ICD-10-CM | POA: Insufficient documentation

## 2021-03-25 DIAGNOSIS — I700 Atherosclerosis of aorta: Secondary | ICD-10-CM | POA: Diagnosis not present

## 2021-03-25 DIAGNOSIS — Z87891 Personal history of nicotine dependence: Secondary | ICD-10-CM | POA: Diagnosis not present

## 2021-03-25 DIAGNOSIS — N201 Calculus of ureter: Secondary | ICD-10-CM | POA: Diagnosis not present

## 2021-03-25 DIAGNOSIS — I10 Essential (primary) hypertension: Secondary | ICD-10-CM | POA: Insufficient documentation

## 2021-03-25 DIAGNOSIS — N139 Obstructive and reflux uropathy, unspecified: Secondary | ICD-10-CM | POA: Diagnosis not present

## 2021-03-25 DIAGNOSIS — Z8616 Personal history of COVID-19: Secondary | ICD-10-CM | POA: Insufficient documentation

## 2021-03-25 DIAGNOSIS — Z79899 Other long term (current) drug therapy: Secondary | ICD-10-CM | POA: Diagnosis not present

## 2021-03-25 DIAGNOSIS — R109 Unspecified abdominal pain: Secondary | ICD-10-CM | POA: Diagnosis present

## 2021-03-25 DIAGNOSIS — Z96641 Presence of right artificial hip joint: Secondary | ICD-10-CM | POA: Diagnosis not present

## 2021-03-25 DIAGNOSIS — N2882 Megaloureter: Secondary | ICD-10-CM | POA: Diagnosis not present

## 2021-03-25 LAB — BASIC METABOLIC PANEL
Anion gap: 9 (ref 5–15)
BUN: 15 mg/dL (ref 8–23)
CO2: 29 mmol/L (ref 22–32)
Calcium: 9.7 mg/dL (ref 8.9–10.3)
Chloride: 100 mmol/L (ref 98–111)
Creatinine, Ser: 1.26 mg/dL — ABNORMAL HIGH (ref 0.61–1.24)
GFR, Estimated: >60 mL/min (ref >60)
Glucose, Bld: 108 mg/dL — ABNORMAL HIGH (ref 70–99)
Potassium: 4 mmol/L (ref 3.5–5.1)
Sodium: 138 mmol/L (ref 135–145)

## 2021-03-25 LAB — CBC
HCT: 40.6 % (ref 39.0–52.0)
Hemoglobin: 13 g/dL (ref 13.0–17.0)
MCH: 29.2 pg (ref 26.0–34.0)
MCHC: 32 g/dL (ref 30.0–36.0)
MCV: 91.2 fL (ref 80.0–100.0)
Platelets: 228 10*3/uL (ref 150–400)
RBC: 4.45 MIL/uL (ref 4.22–5.81)
RDW: 12.6 % (ref 11.5–15.5)
WBC: 9.8 10*3/uL (ref 4.0–10.5)
nRBC: 0 % (ref 0.0–0.2)

## 2021-03-25 LAB — URINALYSIS, ROUTINE W REFLEX MICROSCOPIC
Bilirubin Urine: NEGATIVE
Glucose, UA: NEGATIVE mg/dL
Ketones, ur: 20 mg/dL — AB
Leukocytes,Ua: NEGATIVE
Nitrite: NEGATIVE
Protein, ur: 30 mg/dL — AB
RBC / HPF: >50 RBC/hpf — ABNORMAL HIGH (ref 0–5)
Specific Gravity, Urine: 1.017 (ref 1.005–1.030)
pH: 7 (ref 5.0–8.0)

## 2021-03-25 LAB — LIPASE, BLOOD: Lipase: 44 U/L (ref 11–51)

## 2021-03-25 LAB — HEPATIC FUNCTION PANEL
ALT: 17 U/L (ref 0–44)
AST: 25 U/L (ref 15–41)
Albumin: 3.9 g/dL (ref 3.5–5.0)
Alkaline Phosphatase: 37 U/L — ABNORMAL LOW (ref 38–126)
Bilirubin, Direct: <0.1 mg/dL (ref 0.0–0.2)
Total Bilirubin: 0.7 mg/dL (ref 0.3–1.2)
Total Protein: 7.7 g/dL (ref 6.5–8.1)

## 2021-03-25 MED ORDER — NAPROXEN 500 MG PO TABS: 500.0000 mg | ORAL_TABLET | Freq: Two times a day (BID) | ORAL | 0 refills | Status: DC

## 2021-03-25 MED ORDER — PROMETHAZINE HCL 25 MG/ML IJ SOLN
25.0000 mg | Freq: Once | INTRAMUSCULAR | Status: AC
  Administered 2021-03-25: 25 mg via INTRAMUSCULAR
  Filled 2021-03-25: qty 1

## 2021-03-25 MED ORDER — OXYCODONE-ACETAMINOPHEN 5-325 MG PO TABS: 1.0000 | ORAL_TABLET | Freq: Four times a day (QID) | ORAL | 0 refills | Status: DC | PRN

## 2021-03-25 MED ORDER — KETOROLAC TROMETHAMINE 60 MG/2ML IM SOLN
60.0000 mg | Freq: Once | INTRAMUSCULAR | Status: AC
  Administered 2021-03-25: 60 mg via INTRAMUSCULAR
  Filled 2021-03-25: qty 2

## 2021-03-25 MED ORDER — ONDANSETRON 4 MG PO TBDP: 4.0000 mg | ORAL_TABLET | Freq: Three times a day (TID) | ORAL | 0 refills | Status: DC | PRN

## 2021-03-25 MED ORDER — HYDROCODONE-ACETAMINOPHEN 5-325 MG PO TABS
2.0000 | ORAL_TABLET | Freq: Once | ORAL | Status: AC
  Administered 2021-03-25: 2 via ORAL
  Filled 2021-03-25: qty 2

## 2021-03-25 MED ORDER — ONDANSETRON 4 MG PO TBDP
4.0000 mg | ORAL_TABLET | Freq: Three times a day (TID) | ORAL | 0 refills | Status: DC | PRN
Start: 2021-03-25 — End: 2021-04-20

## 2021-03-25 MED ORDER — TAMSULOSIN HCL 0.4 MG PO CAPS
0.4000 mg | ORAL_CAPSULE | ORAL | Status: AC
  Administered 2021-03-25: 0.4 mg via ORAL
  Filled 2021-03-25: qty 1

## 2021-03-25 MED ORDER — TAMSULOSIN HCL 0.4 MG PO CAPS: 0.4000 mg | ORAL_CAPSULE | Freq: Every day | ORAL | 0 refills | Status: DC

## 2021-03-25 MED ORDER — ONDANSETRON 4 MG PO TBDP
4.0000 mg | ORAL_TABLET | Freq: Once | ORAL | Status: AC
  Administered 2021-03-25: 4 mg via ORAL
  Filled 2021-03-25: qty 1

## 2021-03-25 NOTE — ED Triage Notes (Signed)
Pt reports R flank pain with radiation to abdomen with hematuria since this morning. Pt appears very uncomfortable in triage. Endorses nausea without vomiting.
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
## 2021-03-25 NOTE — ED Notes (Addendum)
Pt alert, interactive, cooperative, restless, writhing, current NV, meds given, other meds ordered and pending, to be sent from pharmacy, EDP notified or delay.

## 2021-03-25 NOTE — ED Provider Notes (Signed)
Emergency Medicine Provider Triage Evaluation Note  Donald [name] , a 65 y.o. male  was evaluated in triage.  Pt complains of right-sided flank and abdominal pain and hematuria.  He does not have a history of similar.  He states he has had nausea with this.  He denies any history of kidney stones.  No fevers.  He states that he did pee out what appeared to be a blood clot today.  Does not take any blood thinners, denies any trauma.  Review of Systems  Positive: Right-sided flank and abdominal pain, hematuria, dysuria, nausea Negative: Fevers, diarrhea  Physical Exam  BP 134/84   Pulse 84   Temp 98 F (36.7 C)   Resp (!) 26   SpO2 100%  Gen:   Awake, no distress   Resp:  Normal effort  MSK:   Moves extremities without difficulty  Other:  Right CVA tenderness to percussion.  No left-sided CVA tenderness to percussion.  Medical Decision Making  Medically screening exam initiated at 3:56 PM.  Appropriate orders placed.  Donald [name] was informed that the remainder of the evaluation will be completed by another provider, this initial triage assessment does not replace that evaluation, and the importance of remaining in the ED until their evaluation is complete.  Note: Portions of this report may have been transcribed using voice recognition software. Every effort was made to ensure accuracy; however, inadvertent computerized transcription errors may be present    Lorin Glass, PA-C 03/25/21 1558    Pattricia Boss, MD 03/25/21 1704

## 2021-03-28 ENCOUNTER — Encounter: Payer: Self-pay | Admitting: Internal Medicine

## 2021-03-28 DIAGNOSIS — N201 Calculus of ureter: Secondary | ICD-10-CM | POA: Insufficient documentation
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
## 2021-03-29 ENCOUNTER — Ambulatory Visit (INDEPENDENT_AMBULATORY_CARE_PROVIDER_SITE_OTHER): Payer: Medicare HMO | Admitting: Internal Medicine

## 2021-03-29 ENCOUNTER — Other Ambulatory Visit: Payer: Self-pay

## 2021-03-29 DIAGNOSIS — N201 Calculus of ureter: Secondary | ICD-10-CM | POA: Diagnosis not present

## 2021-03-29 MED ORDER — TAMSULOSIN HCL 0.4 MG PO CAPS: 0.4000 mg | ORAL_CAPSULE | Freq: Every day | ORAL | 0 refills | Status: DC

## 2021-03-29 NOTE — Patient Instructions (Addendum)
     Medications changes include :  continue the flomax.     Your prescription(s) have been submitted to your pharmacy. Please take as directed and contact our office if you believe you are having problem(s) with the medication(s).   A Ct of your kidneys was ordered.  Someone from their office will call you to schedule an appointment.

## 2021-03-30 ENCOUNTER — Ambulatory Visit (INDEPENDENT_AMBULATORY_CARE_PROVIDER_SITE_OTHER)
Admission: RE | Admit: 2021-03-30 | Discharge: 2021-03-30 | Disposition: A | Discharge status: Home / Self Care | Payer: Medicare HMO | Source: Ambulatory Visit | Attending: Internal Medicine | Admitting: Internal Medicine

## 2021-03-30 DIAGNOSIS — N201 Calculus of ureter: Secondary | ICD-10-CM | POA: Diagnosis not present

## 2021-03-30 DIAGNOSIS — I700 Atherosclerosis of aorta: Secondary | ICD-10-CM | POA: Diagnosis not present

## 2021-03-30 DIAGNOSIS — Z9049 Acquired absence of other specified parts of digestive tract: Secondary | ICD-10-CM | POA: Diagnosis not present

## 2021-03-30 DIAGNOSIS — R109 Unspecified abdominal pain: Secondary | ICD-10-CM | POA: Diagnosis not present

## 2021-03-31 DIAGNOSIS — Z96641 Presence of right artificial hip joint: Secondary | ICD-10-CM | POA: Diagnosis not present

## 2021-04-01 DIAGNOSIS — R350 Frequency of micturition: Secondary | ICD-10-CM | POA: Diagnosis not present

## 2021-04-01 DIAGNOSIS — E119 Type 2 diabetes mellitus without complications: Secondary | ICD-10-CM | POA: Diagnosis not present

## 2021-04-01 DIAGNOSIS — N401 Enlarged prostate with lower urinary tract symptoms: Secondary | ICD-10-CM | POA: Diagnosis not present

## 2021-04-01 DIAGNOSIS — Z96641 Presence of right artificial hip joint: Secondary | ICD-10-CM | POA: Diagnosis not present

## 2021-04-01 DIAGNOSIS — Z471 Aftercare following joint replacement surgery: Secondary | ICD-10-CM | POA: Diagnosis not present

## 2021-04-01 DIAGNOSIS — E785 Hyperlipidemia, unspecified: Secondary | ICD-10-CM | POA: Diagnosis not present

## 2021-04-01 DIAGNOSIS — I119 Hypertensive heart disease without heart failure: Secondary | ICD-10-CM | POA: Diagnosis not present

## 2021-04-01 DIAGNOSIS — G4731 Primary central sleep apnea: Secondary | ICD-10-CM | POA: Diagnosis not present

## 2021-04-01 DIAGNOSIS — G4733 Obstructive sleep apnea (adult) (pediatric): Secondary | ICD-10-CM | POA: Diagnosis not present

## 2021-04-15 ENCOUNTER — Telehealth: Payer: Self-pay | Admitting: Internal Medicine

## 2021-04-15 NOTE — Telephone Encounter (Signed)
1.Medication Requested: naproxen (NAPROSYN) 500 MG tablet celecoxib (CELEBREX) 200 MG capsule tamsulosin (FLOMAX) 0.4 MG CAPS capsule 2. Pharmacy (Name, Street, [place]): [facility] [address] [place] Phone:  [phone]  Fax:  [phone]     3. On Med List: Y  4. Last Visit with PCP: 03/29/21  5. Next visit date with PCP: 04/20/2021   Agent: Please be advised that RX refills may take up to 3 business days. We ask that you follow-up with your pharmacy.

## 2021-04-17 ENCOUNTER — Other Ambulatory Visit: Payer: Self-pay | Admitting: Internal Medicine

## 2021-04-17 DIAGNOSIS — N201 Calculus of ureter: Secondary | ICD-10-CM

## 2021-04-17 DIAGNOSIS — N401 Enlarged prostate with lower urinary tract symptoms: Secondary | ICD-10-CM

## 2021-04-17 DIAGNOSIS — R350 Frequency of micturition: Secondary | ICD-10-CM

## 2021-04-17 DIAGNOSIS — M159 Polyosteoarthritis, unspecified: Secondary | ICD-10-CM

## 2021-04-17 MED ORDER — TAMSULOSIN HCL 0.4 MG PO CAPS: 0.4000 mg | ORAL_CAPSULE | Freq: Every day | ORAL | 0 refills | Status: DC

## 2021-04-17 MED ORDER — CELECOXIB 100 MG PO CAPS: 100.0000 mg | ORAL_CAPSULE | Freq: Two times a day (BID) | ORAL | 0 refills | Status: DC

## 2021-04-20 ENCOUNTER — Encounter: Payer: Self-pay | Admitting: Internal Medicine

## 2021-04-20 ENCOUNTER — Other Ambulatory Visit: Payer: Self-pay

## 2021-04-20 ENCOUNTER — Ambulatory Visit (INDEPENDENT_AMBULATORY_CARE_PROVIDER_SITE_OTHER): Payer: Medicare HMO | Admitting: Internal Medicine

## 2021-04-20 VITALS — BP 108/68 | HR 94 | Temp 98.3°F | Resp 16 | Ht 70.0 in | Wt 241.0 lb

## 2021-04-20 DIAGNOSIS — N201 Calculus of ureter: Secondary | ICD-10-CM | POA: Diagnosis not present

## 2021-04-20 DIAGNOSIS — E119 Type 2 diabetes mellitus without complications: Secondary | ICD-10-CM | POA: Diagnosis not present

## 2021-04-20 DIAGNOSIS — I10 Essential (primary) hypertension: Secondary | ICD-10-CM | POA: Diagnosis not present

## 2021-04-20 NOTE — Progress Notes (Signed)
Subjective:  Patient ID: Donald [name], male    DOB: 1955-07-25  Age: 65 y.o. MRN: [id number]  CC: Hypertension and Diabetes  This visit occurred during the SARS-CoV-2 public health emergency.  Safety protocols were in place, including screening questions prior to the visit, additional usage of staff PPE, and extensive cleaning of exam room while observing appropriate contact time as indicated for disinfecting solutions.    HPI Donald [name] presents for f/up -  He continues to complain of discomfort in his right lower quadrant that he describes a twinge.  He was recently treated for a kidney stone.  He denies hematuria, dysuria, nausea, vomiting, fever, or chills.  He is losing weight with lifestyle modifications.  He is active and denies chest pain, shortness of breath, diaphoresis, dizziness, or lightheadedness.  He is not willing to get vaccinated against pneumonia or influenza today.  Outpatient Medications Prior to Visit  Medication Sig Dispense Refill   buprenorphine-naloxone (SUBOXONE) 8-2 mg SUBL SL tablet Place 1 tablet under the tongue daily.     celecoxib (CELEBREX) 100 MG capsule Take 1 capsule (100 mg total) by mouth 2 (two) times daily. 180 capsule 0   Cholecalciferol (D3 VITAMIN PO) Take 5 drops by mouth daily. Plus Vitamin K liquid daily     Cholecalciferol (VITAMIN D3) 125 MCG (5000 UT) CAPS Take 5,000 Units by mouth in the morning and at bedtime.     Cyanocobalamin (B-12 PO) Take 4 drops by mouth daily.     fluticasone (FLONASE) 50 MCG/ACT nasal spray Place 2 sprays into both nostrils daily as needed for allergies or rhinitis.     Folic Acid-Vit H4-TML Y65 0.5-5-0.2 MG TABS Take 1 tablet by mouth daily. B6 Complex     hydrochlorothiazide (HYDRODIURIL) 25 MG tablet TAKE 1 TABLET(25 MG) BY MOUTH EVERY MORNING (Patient taking differently: Take 25 mg by mouth every morning. TAKE 1 TABLET(25 MG) BY MOUTH EVERY MORNING) 90 tablet 1   losartan (COZAAR) 25 MG tablet TAKE 1  TABLET(25 MG) BY MOUTH DAILY AT 10 PM 90 tablet 1   Multiple Vitamins-Minerals (MULTIVITAMIN WITH MINERALS) tablet Take 1 tablet by mouth daily.     NON FORMULARY Take 5 drops by mouth daily. Vitamin A,D,K daily     Omega 3-6-9 Fatty Acids (OMEGA-3-6-9 PO) Take 15 mLs by mouth daily. 15.9 g     omeprazole (PRILOSEC) 20 MG capsule Take 20 mg by mouth daily as needed (acid reflux).  1   rosuvastatin (CRESTOR) 10 MG tablet Take 10 mg by mouth at bedtime.     tamsulosin (FLOMAX) 0.4 MG CAPS capsule Take 1 capsule (0.4 mg total) by mouth daily. 90 capsule 0   tirzepatide (MOUNJARO) 5 MG/0.5ML Pen Inject 5 mg into the skin once a week. 6 mL 1   Turmeric 500 MG CAPS Take 1,000 mg by mouth daily.     nebivolol (BYSTOLIC) 5 MG tablet Take by mouth.     ondansetron (ZOFRAN ODT) 4 MG disintegrating tablet Take 1 tablet (4 mg total) by mouth every 8 (eight) hours as needed for nausea. 10 tablet 0   Facility-Administered Medications Prior to Visit  Medication Dose Route Frequency Provider Last Rate Last Admin   lactated ringers infusion   Intravenous Continuous Rometta Emery, PA-C   New Bag at 10/06/11 1151    ROS Review of Systems  Constitutional:  Negative for chills, diaphoresis, fatigue and fever.  HENT: Negative.    Eyes: Negative.   Respiratory:  Negative  for cough, chest tightness, shortness of breath and wheezing.   Cardiovascular:  Negative for chest pain, palpitations and leg swelling.  Gastrointestinal:  Positive for abdominal pain. Negative for constipation, diarrhea and nausea.  Endocrine: Negative.   Genitourinary: Negative.  Negative for difficulty urinating, flank pain, hematuria, scrotal swelling and urgency.  Musculoskeletal:  Positive for arthralgias.  Skin: Negative.  Negative for rash.  Neurological: Negative.  Negative for dizziness and weakness.  Hematological:  Negative for adenopathy. Does not bruise/bleed easily.  Psychiatric/Behavioral: Negative.     Objective:   BP 108/68 (BP Location: Left Arm, Patient Position: Sitting, Cuff Size: Large)   Pulse 94   Temp 98.3 F (36.8 C) (Oral)   Resp 16   Ht 5\' 10"  (1.778 m)   Wt 241 lb (109.3 kg)   SpO2 98%   BMI 34.58 kg/m   BP Readings from Last 3 Encounters:  04/20/21 108/68  03/29/21 118/68  03/25/21 124/80    Wt Readings from Last 3 Encounters:  04/20/21 241 lb (109.3 kg)  03/29/21 248 lb (112.5 kg)  02/16/21 239 lb (108.4 kg)    Physical Exam Vitals reviewed.  Constitutional:      Appearance: Normal appearance. He is not ill-appearing.  HENT:     Nose: Nose normal.     Mouth/Throat:     Mouth: Mucous membranes are moist.  Eyes:     General: No scleral icterus.    Conjunctiva/sclera: Conjunctivae normal.  Cardiovascular:     Rate and Rhythm: Normal rate and regular rhythm.     Heart sounds: No murmur heard. Pulmonary:     Effort: Pulmonary effort is normal.     Breath sounds: No stridor. No wheezing, rhonchi or rales.  Abdominal:     General: Abdomen is flat.     Palpations: There is no mass.     Tenderness: There is no abdominal tenderness. There is no guarding.     Hernia: No hernia is present.  Musculoskeletal:        General: Normal range of motion.     Cervical back: Neck supple.     Right lower leg: No edema.     Left lower leg: No edema.  Lymphadenopathy:     Cervical: No cervical adenopathy.  Skin:    General: Skin is warm and dry.  Neurological:     General: No focal deficit present.     Mental Status: He is alert.  Psychiatric:        Mood and Affect: Mood normal.    Lab Results  Component Value Date   WBC 9.8 03/25/2021   HGB 13.0 03/25/2021   HCT 40.6 03/25/2021   PLT 228 03/25/2021   GLUCOSE 108 (H) 03/25/2021   CHOL 168 09/30/2020   TRIG 72.0 09/30/2020   HDL 55.70 09/30/2020   LDLCALC 98 09/30/2020   ALT 17 03/25/2021   AST 25 03/25/2021   NA 138 03/25/2021   K 4.0 03/25/2021   CL 100 03/25/2021   CREATININE 1.26 (H) 03/25/2021   BUN 15  03/25/2021   CO2 29 03/25/2021   TSH 1.02 09/30/2020   PSA 0.59 09/30/2020   INR 1.0 02/04/2021   HGBA1C 6.8 (H) 02/04/2021   MICROALBUR <0.7 09/30/2020    CT RENAL STONE STUDY  Result Date: 03/30/2021 CLINICAL DATA:  66 year old male with history of right-sided flank pain for the past 6 days. EXAM: CT ABDOMEN AND PELVIS WITHOUT CONTRAST TECHNIQUE: Multidetector CT imaging of the abdomen and pelvis was  performed following the standard protocol without IV contrast. COMPARISON:  CT of the abdomen and pelvis 03/25/2021. FINDINGS: Lower chest: Unremarkable. Hepatobiliary: No suspicious cystic or solid hepatic lesions are confidently identified on today's noncontrast CT examination. Unenhanced appearance of the gallbladder is normal. Pancreas: No definite pancreatic mass or peripancreatic fluid collections or inflammatory changes are noted on today's noncontrast CT examination. Spleen: Unremarkable. Adrenals/Urinary Tract: There are no abnormal calcifications within the collecting system of either kidney, along the course of either ureter, or within the lumen of the urinary bladder. No hydroureteronephrosis or perinephric stranding to suggest urinary tract obstruction at this time. The unenhanced appearance of the kidneys is unremarkable bilaterally. Unenhanced appearance of the urinary bladder is grossly unremarkable (urinary bladder is nearly completely decompressed and partially obscured by beam hardening artifact from the patient's right hip arthroplasty). Bilateral adrenal glands are normal in appearance. Stomach/Bowel: Unenhanced appearance of the stomach is normal. No pathologic dilatation of small bowel or colon. Status post right hemicolectomy. Vascular/Lymphatic: Atherosclerotic calcifications in the pelvic vasculature. No lymphadenopathy noted in the abdomen or pelvis. Reproductive: Prostate gland and seminal vesicles are unremarkable in appearance. Other: No significant volume of ascites.  No  pneumoperitoneum. Musculoskeletal: Posterior fixation hardware is noted extending from L4-S1. Status post right hip arthroplasty. There are no aggressive appearing lytic or blastic lesions noted in the visualized portions of the skeleton. IMPRESSION: 1. No acute findings are noted in the abdomen or pelvis to account for the patient's symptoms. Specifically, no urinary tract calculi no findings of urinary tract obstruction are noted at this time. 2. Atherosclerosis. 3. Additional incidental findings, as above. Electronically Signed   By: Vinnie Langton M.D.   On: 03/30/2021 11:36    Assessment & Plan:   Severo was seen today for hypertension and diabetes.  Diagnoses and all orders for this visit:  Primary hypertension- His blood pressure is adequately well controlled. -     Urinalysis, Routine w reflex microscopic; Future -     Urinalysis, Routine w reflex microscopic  Right ureteral stone- His UA is normal.  He will let me know if he develops any new or worsening symptoms. -     Urinalysis, Routine w reflex microscopic; Future -     Urinalysis, Routine w reflex microscopic  Type 2 diabetes mellitus without complication, without long-term current use of insulin ([place])- Will continue increasing the dose of the GLP/GIP agonist.  I have discontinued Donald [name]. Donald [name]'s ondansetron and nebivolol. I am also having him maintain his buprenorphine-naloxone, omeprazole, NON FORMULARY, Cholecalciferol (D3 VITAMIN PO), rosuvastatin, losartan, hydrochlorothiazide, multivitamin with minerals, Omega 3-6-9 Fatty Acids (OMEGA-3-6-9 PO), Turmeric, Cyanocobalamin (G-29 PO), Folic Acid-Vit B2-WUX L24, Vitamin D3, fluticasone, tirzepatide, celecoxib, and tamsulosin.  No orders of the defined types were placed in this encounter.    Follow-up: Return in about 3 months (around 07/21/2021).  Scarlette Calico, MD

## 2021-04-20 NOTE — Patient Instructions (Signed)
Type 2 Diabetes Mellitus, Diagnosis, Adult ?Type 2 diabetes (type 2 diabetes mellitus) is a long-term, or chronic, disease. In type 2 diabetes, one or both of these problems may be present: ?The pancreas does not make enough of a hormone called insulin. ?Cells in the body do not respond properly to the insulin that the body makes (insulin resistance). ?Normally, insulin allows blood sugar (glucose) to enter cells in the body. The cells use glucose for energy. Insulin resistance or lack of insulin causes excess glucose to build up in the blood instead of going into cells. This causes high blood glucose (hyperglycemia).  ?What are the causes? ?The exact cause of type 2 diabetes is not known. ?What increases the risk? ?The following factors may make you more likely to develop this condition: ?Having a family member with type 2 diabetes. ?Being overweight or obese. ?Being inactive (sedentary). ?Having been diagnosed with insulin resistance. ?Having a history of prediabetes, diabetes when you were pregnant (gestational diabetes), or polycystic ovary syndrome (PCOS). ?What are the signs or symptoms? ?In the early stage of this condition, you may not have symptoms. Symptoms develop slowly and may include: ?Increased thirst or hunger. ?Increased urination. ?Unexplained weight loss. ?Tiredness (fatigue) or weakness. ?Vision changes, such as blurry vision. ?Dark patches on the skin. ?How is this diagnosed? ?This condition is diagnosed based on your symptoms, your medical history, a physical exam, and your blood glucose level. Your blood glucose may be checked with one or more of the following blood tests: ?A fasting blood glucose (FBG) test. You will not be allowed to eat (you will fast) for 8 hours or longer before a blood sample is taken. ?A random blood glucose test. This test checks blood glucose at any time of day regardless of when you ate. ?An A1C (hemoglobin A1C) blood test. This test provides information about blood  glucose levels over the previous 2-3 months. ?An oral glucose tolerance test (OGTT). This test measures your blood glucose at two times: ?After fasting. This is your baseline blood glucose level. ?Two hours after drinking a beverage that contains glucose. ?You may be diagnosed with type 2 diabetes if: ?Your fasting blood glucose level is 126 mg/dL (7.0 mmol/L) or higher. ?Your random blood glucose level is 200 mg/dL (11.1 mmol/L) or higher. ?Your A1C level is 6.5% or higher. ?Your oral glucose tolerance test result is higher than 200 mg/dL (11.1 mmol/L). ?These blood tests may be repeated to confirm your diagnosis. ?How is this treated? ?Your treatment may be managed by a specialist called an endocrinologist. Type 2 diabetes may be treated by following instructions from your health care provider about: ?Making dietary and lifestyle changes. These may include: ?Following a personalized nutrition plan that is developed by a registered dietitian. ?Exercising regularly. ?Finding ways to manage stress. ?Checking your blood glucose level as often as told. ?Taking diabetes medicines or insulin daily. This helps to keep your blood glucose levels in the healthy range. ?Taking medicines to help prevent complications from diabetes. Medicines may include: ?Aspirin. ?Medicine to lower cholesterol. ?Medicine to control blood pressure. ?Your health care provider will set treatment goals for you. Your goals will be based on your age, other medical conditions you have, and how you respond to diabetes treatment. Generally, the goal of treatment is to maintain the following blood glucose levels: ?Before meals: 80-130 mg/dL (4.4-7.2 mmol/L). ?After meals: below 180 mg/dL (10 mmol/L). ?A1C level: less than 7%. ?Follow these instructions at home: ?Questions to ask your health care provider ?  Consider asking the following questions: ?Should I meet with a certified diabetes care and education specialist? ?What diabetes medicines do I need,  and when should I take them? ?What equipment will I need to manage my diabetes at home? ?How often do I need to check my blood glucose? ?Where can I find a support group for people with diabetes? ?What number can I call if I have questions? ?When is my next appointment? ?General instructions ?Take over-the-counter and prescription medicines only as told by your health care provider. ?Keep all follow-up visits. This is important. ?Where to find more information ?For help and guidance and for more information about diabetes, please visit: ?American Diabetes Association (ADA): www.diabetes.org ?American Association of Diabetes Care and Education Specialists (ADCES): www.diabeteseducator.org ?International Diabetes Federation (IDF): www.idf.org ?Contact a health care provider if: ?Your blood glucose is at or above 240 mg/dL (13.3 mmol/L) for 2 days in a row. ?You have been sick or have had a fever for 2 days or longer, and you are not getting better. ?You have any of the following problems for more than 6 hours: ?You cannot eat or drink. ?You have nausea and vomiting. ?You have diarrhea. ?Get help right away if: ?You have severe hypoglycemia. This means your blood glucose is lower than 54 mg/dL (3.0 mmol/L). ?You become confused or you have trouble thinking clearly. ?You have difficulty breathing. ?You have moderate or large ketone levels in your urine. ?These symptoms may represent a serious problem that is an emergency. Do not wait to see if the symptoms will go away. Get medical help right away. Call your local emergency services (911 in the [place] Do not drive yourself to the hospital. ?Summary ?Type 2 diabetes mellitus is a long-term, or chronic, disease. In type 2 diabetes, the pancreas does not make enough of a hormone called insulin, or cells in the body do not respond properly to insulin that the body makes. ?This condition is treated by making dietary and lifestyle changes and taking diabetes medicines or  insulin. ?Your health care provider will set treatment goals for you. Your goals will be based on your age, other medical conditions you have, and how you respond to diabetes treatment. ?Keep all follow-up visits. This is important. ?This information is not intended to replace advice given to you by your health care provider. Make sure you discuss any questions you have with your health care provider. ?Document Revised: 08/16/2020 Document Reviewed: 08/16/2020 ?Elsevier Patient Education ? 2022 Elsevier [employer] ? ?

## 2021-04-21 LAB — URINALYSIS, ROUTINE W REFLEX MICROSCOPIC
Bilirubin Urine: NEGATIVE
Hgb urine dipstick: NEGATIVE
Leukocytes,Ua: NEGATIVE
Nitrite: NEGATIVE
RBC / HPF: NONE SEEN (ref 0–?)
Specific Gravity, Urine: >=1.03 — AB (ref 1.000–1.030)
Total Protein, Urine: NEGATIVE
Urine Glucose: NEGATIVE
Urobilinogen, UA: 0.2 (ref 0.0–1.0)
pH: 5.5 (ref 5.0–8.0)

## 2021-05-24 DIAGNOSIS — M47816 Spondylosis without myelopathy or radiculopathy, lumbar region: Secondary | ICD-10-CM | POA: Diagnosis not present

## 2021-05-24 DIAGNOSIS — Z5181 Encounter for therapeutic drug level monitoring: Secondary | ICD-10-CM | POA: Diagnosis not present

## 2021-05-24 DIAGNOSIS — M961 Postlaminectomy syndrome, not elsewhere classified: Secondary | ICD-10-CM | POA: Diagnosis not present

## 2021-05-24 DIAGNOSIS — M5416 Radiculopathy, lumbar region: Secondary | ICD-10-CM | POA: Diagnosis not present

## 2021-05-24 DIAGNOSIS — R200 Anesthesia of skin: Secondary | ICD-10-CM | POA: Diagnosis not present

## 2021-05-24 DIAGNOSIS — Z79899 Other long term (current) drug therapy: Secondary | ICD-10-CM | POA: Diagnosis not present

## 2021-06-02 ENCOUNTER — Telehealth: Payer: Self-pay | Admitting: Internal Medicine

## 2021-06-02 NOTE — Telephone Encounter (Signed)
Patient calling in  Says he is having a lot of concerns & arthritis pain  Wants to know if provider is willing to send in new rx for predniSONE (DELTASONE) 10 MG tablet until he is able to be seen by [facility], [place]  Phone:  [phone] Fax:  [phone]

## 2021-06-07 NOTE — Telephone Encounter (Signed)
Pt calling in after receiving message from the [facility] stating that Dr. Ronnald Ramp would like to see you to discuss alternatives because he stated it would not be safe for you to continue taking the prednisone. Pt has a appt on 1/6 with Dr. Jenny Reichmann to discuss a referral for rheumatoid specialist?  Would you like Dr.John to address the alternatives to Prednisone or does he need an appt with you?

## 2021-06-07 NOTE — Telephone Encounter (Signed)
Called pt, LVM stating an OV is needed to discuss alternatives.

## 2021-06-07 NOTE — Telephone Encounter (Signed)
Thanks

## 2021-06-10 ENCOUNTER — Ambulatory Visit (INDEPENDENT_AMBULATORY_CARE_PROVIDER_SITE_OTHER): Payer: Medicare HMO | Admitting: Internal Medicine

## 2021-06-10 ENCOUNTER — Encounter: Payer: Self-pay | Admitting: Internal Medicine

## 2021-06-10 ENCOUNTER — Other Ambulatory Visit: Payer: Self-pay

## 2021-06-10 VITALS — BP 130/74 | HR 66 | Temp 98.0°F | Ht 70.0 in | Wt 236.0 lb

## 2021-06-10 DIAGNOSIS — M79642 Pain in left hand: Secondary | ICD-10-CM | POA: Diagnosis not present

## 2021-06-10 DIAGNOSIS — M79641 Pain in right hand: Secondary | ICD-10-CM

## 2021-06-10 DIAGNOSIS — E1165 Type 2 diabetes mellitus with hyperglycemia: Secondary | ICD-10-CM | POA: Diagnosis not present

## 2021-06-10 DIAGNOSIS — F32A Depression, unspecified: Secondary | ICD-10-CM

## 2021-06-10 DIAGNOSIS — G894 Chronic pain syndrome: Secondary | ICD-10-CM | POA: Diagnosis not present

## 2021-06-10 MED ORDER — PREDNISONE 5 MG PO TABS: 5.0000 mg | ORAL_TABLET | Freq: Two times a day (BID) | ORAL | 1 refills | Status: DC

## 2021-06-10 NOTE — Patient Instructions (Signed)
Please take all new medication as prescribed - the prednisone  You will be contacted regarding the referral for: Rheumatology  Please continue all other medications as before, and refills have been done if requested.  Please have the pharmacy call with any other refills you may need.  Please keep your appointments with your specialists as you may have planned
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
## 2021-06-11 ENCOUNTER — Encounter: Payer: Self-pay | Admitting: Internal Medicine

## 2021-06-11 NOTE — Assessment & Plan Note (Signed)
Pt with multiple comorbids including anxiety and chronic pain, now with hand pain, cant r/o RA, for pred 5 bid, and refer rheumatology as per pt request, declines further labs now

## 2021-06-11 NOTE — Assessment & Plan Note (Signed)
Chronic stable it seems without SI or HI, continue same tx - decnes SSRI

## 2021-06-11 NOTE — Assessment & Plan Note (Signed)
Lab Results  Component Value Date   HGBA1C 6.8 (H) 02/04/2021   Mild uncontrolled, pt to continue current medical treatment monjouro

## 2021-06-11 NOTE — Assessment & Plan Note (Signed)
O/w to continue suboxone, f/u pain management

## 2021-06-16 ENCOUNTER — Telehealth: Payer: Self-pay

## 2021-06-16 NOTE — Telephone Encounter (Signed)
PA for [facility] 5MG /0.5ML pen-injectors  Key: [id number]   Approved, Coverage Starts on: 06/05/2021 12:00:00 AM, Coverage Ends on: 06/04/2022 12:00:00 AM.

## 2021-07-01 ENCOUNTER — Encounter: Payer: Self-pay | Admitting: Cardiology

## 2021-07-01 ENCOUNTER — Other Ambulatory Visit: Payer: Self-pay

## 2021-07-01 ENCOUNTER — Ambulatory Visit: Payer: Medicare HMO | Admitting: Cardiology

## 2021-07-01 VITALS — BP 134/75 | HR 69 | Resp 16 | Ht 70.0 in | Wt 235.0 lb

## 2021-07-01 DIAGNOSIS — Z8616 Personal history of COVID-19: Secondary | ICD-10-CM

## 2021-07-01 DIAGNOSIS — Z6833 Body mass index (BMI) 33.0-33.9, adult: Secondary | ICD-10-CM | POA: Diagnosis not present

## 2021-07-01 DIAGNOSIS — Z9989 Dependence on other enabling machines and devices: Secondary | ICD-10-CM | POA: Diagnosis not present

## 2021-07-01 DIAGNOSIS — I517 Cardiomegaly: Secondary | ICD-10-CM

## 2021-07-01 DIAGNOSIS — E1165 Type 2 diabetes mellitus with hyperglycemia: Secondary | ICD-10-CM | POA: Diagnosis not present

## 2021-07-01 DIAGNOSIS — G4733 Obstructive sleep apnea (adult) (pediatric): Secondary | ICD-10-CM | POA: Diagnosis not present

## 2021-07-01 DIAGNOSIS — Z87891 Personal history of nicotine dependence: Secondary | ICD-10-CM

## 2021-07-01 DIAGNOSIS — I10 Essential (primary) hypertension: Secondary | ICD-10-CM

## 2021-07-01 DIAGNOSIS — E6609 Other obesity due to excess calories: Secondary | ICD-10-CM
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
## 2021-07-21 ENCOUNTER — Ambulatory Visit: Payer: Medicare HMO | Admitting: Internal Medicine

## 2021-07-27 ENCOUNTER — Encounter: Payer: Self-pay | Admitting: Internal Medicine

## 2021-07-27 ENCOUNTER — Ambulatory Visit (INDEPENDENT_AMBULATORY_CARE_PROVIDER_SITE_OTHER): Payer: Medicare HMO | Admitting: Internal Medicine

## 2021-07-27 ENCOUNTER — Other Ambulatory Visit: Payer: Self-pay

## 2021-07-27 VITALS — BP 118/72 | HR 72 | Temp 98.2°F | Ht 70.0 in | Wt 229.6 lb

## 2021-07-27 DIAGNOSIS — M159 Polyosteoarthritis, unspecified: Secondary | ICD-10-CM

## 2021-07-27 DIAGNOSIS — E1165 Type 2 diabetes mellitus with hyperglycemia: Secondary | ICD-10-CM | POA: Diagnosis not present

## 2021-07-27 DIAGNOSIS — I10 Essential (primary) hypertension: Secondary | ICD-10-CM | POA: Diagnosis not present

## 2021-07-27 DIAGNOSIS — Z23 Encounter for immunization: Secondary | ICD-10-CM

## 2021-07-27 MED ORDER — SHINGRIX 50 MCG/0.5ML IM SUSR: 0.5000 mL | Freq: Once | INTRAMUSCULAR | 1 refills | Status: AC

## 2021-07-27 MED ORDER — PREDNISONE 5 MG PO TABS: 5.0000 mg | ORAL_TABLET | Freq: Every day | ORAL | 1 refills | Status: DC

## 2021-07-27 MED ORDER — TIRZEPATIDE 7.5 MG/0.5ML ~~LOC~~ SOAJ: 7.5000 mg | SUBCUTANEOUS | 0 refills | Status: DC

## 2021-07-27 NOTE — Patient Instructions (Signed)
Type 2 Diabetes Mellitus, Diagnosis, Adult ?Type 2 diabetes (type 2 diabetes mellitus) is a long-term, or chronic, disease. In type 2 diabetes, one or both of these problems may be present: ?The pancreas does not make enough of a hormone called insulin. ?Cells in the body do not respond properly to the insulin that the body makes (insulin resistance). ?Normally, insulin allows blood sugar (glucose) to enter cells in the body. The cells use glucose for energy. Insulin resistance or lack of insulin causes excess glucose to build up in the blood instead of going into cells. This causes high blood glucose (hyperglycemia).  ?What are the causes? ?The exact cause of type 2 diabetes is not known. ?What increases the risk? ?The following factors may make you more likely to develop this condition: ?Having a family member with type 2 diabetes. ?Being overweight or obese. ?Being inactive (sedentary). ?Having been diagnosed with insulin resistance. ?Having a history of prediabetes, diabetes when you were pregnant (gestational diabetes), or polycystic ovary syndrome (PCOS). ?What are the signs or symptoms? ?In the early stage of this condition, you may not have symptoms. Symptoms develop slowly and may include: ?Increased thirst or hunger. ?Increased urination. ?Unexplained weight loss. ?Tiredness (fatigue) or weakness. ?Vision changes, such as blurry vision. ?Dark patches on the skin. ?How is this diagnosed? ?This condition is diagnosed based on your symptoms, your medical history, a physical exam, and your blood glucose level. Your blood glucose may be checked with one or more of the following blood tests: ?A fasting blood glucose (FBG) test. You will not be allowed to eat (you will fast) for 8 hours or longer before a blood sample is taken. ?A random blood glucose test. This test checks blood glucose at any time of day regardless of when you ate. ?An A1C (hemoglobin A1C) blood test. This test provides information about blood  glucose levels over the previous 2-3 months. ?An oral glucose tolerance test (OGTT). This test measures your blood glucose at two times: ?After fasting. This is your baseline blood glucose level. ?Two hours after drinking a beverage that contains glucose. ?You may be diagnosed with type 2 diabetes if: ?Your fasting blood glucose level is 126 mg/dL (7.0 mmol/L) or higher. ?Your random blood glucose level is 200 mg/dL (11.1 mmol/L) or higher. ?Your A1C level is 6.5% or higher. ?Your oral glucose tolerance test result is higher than 200 mg/dL (11.1 mmol/L). ?These blood tests may be repeated to confirm your diagnosis. ?How is this treated? ?Your treatment may be managed by a specialist called an endocrinologist. Type 2 diabetes may be treated by following instructions from your health care provider about: ?Making dietary and lifestyle changes. These may include: ?Following a personalized nutrition plan that is developed by a registered dietitian. ?Exercising regularly. ?Finding ways to manage stress. ?Checking your blood glucose level as often as told. ?Taking diabetes medicines or insulin daily. This helps to keep your blood glucose levels in the healthy range. ?Taking medicines to help prevent complications from diabetes. Medicines may include: ?Aspirin. ?Medicine to lower cholesterol. ?Medicine to control blood pressure. ?Your health care provider will set treatment goals for you. Your goals will be based on your age, other medical conditions you have, and how you respond to diabetes treatment. Generally, the goal of treatment is to maintain the following blood glucose levels: ?Before meals: 80-130 mg/dL (4.4-7.2 mmol/L). ?After meals: below 180 mg/dL (10 mmol/L). ?A1C level: less than 7%. ?Follow these instructions at home: ?Questions to ask your health care provider ?  Consider asking the following questions: ?Should I meet with a certified diabetes care and education specialist? ?What diabetes medicines do I need,  and when should I take them? ?What equipment will I need to manage my diabetes at home? ?How often do I need to check my blood glucose? ?Where can I find a support group for people with diabetes? ?What number can I call if I have questions? ?When is my next appointment? ?General instructions ?Take over-the-counter and prescription medicines only as told by your health care provider. ?Keep all follow-up visits. This is important. ?Where to find more information ?For help and guidance and for more information about diabetes, please visit: ?American Diabetes Association (ADA): www.diabetes.org ?American Association of Diabetes Care and Education Specialists (ADCES): www.diabeteseducator.org ?International Diabetes Federation (IDF): www.idf.org ?Contact a health care provider if: ?Your blood glucose is at or above 240 mg/dL (13.3 mmol/L) for 2 days in a row. ?You have been sick or have had a fever for 2 days or longer, and you are not getting better. ?You have any of the following problems for more than 6 hours: ?You cannot eat or drink. ?You have nausea and vomiting. ?You have diarrhea. ?Get help right away if: ?You have severe hypoglycemia. This means your blood glucose is lower than 54 mg/dL (3.0 mmol/L). ?You become confused or you have trouble thinking clearly. ?You have difficulty breathing. ?You have moderate or large ketone levels in your urine. ?These symptoms may represent a serious problem that is an emergency. Do not wait to see if the symptoms will go away. Get medical help right away. Call your local emergency services (911 in the [place] Do not drive yourself to the hospital. ?Summary ?Type 2 diabetes mellitus is a long-term, or chronic, disease. In type 2 diabetes, the pancreas does not make enough of a hormone called insulin, or cells in the body do not respond properly to insulin that the body makes. ?This condition is treated by making dietary and lifestyle changes and taking diabetes medicines or  insulin. ?Your health care provider will set treatment goals for you. Your goals will be based on your age, other medical conditions you have, and how you respond to diabetes treatment. ?Keep all follow-up visits. This is important. ?This information is not intended to replace advice given to you by your health care provider. Make sure you discuss any questions you have with your health care provider. ?Document Revised: 08/16/2020 Document Reviewed: 08/16/2020 ?Elsevier Patient Education ? 2022 Elsevier [employer] ? ?

## 2021-07-27 NOTE — Progress Notes (Signed)
Subjective:  Patient ID: Donald [name], male    DOB: 15-Feb-1956  Age: 66 y.o. MRN: [id number]  CC: Hypertension and Diabetes  This visit occurred during the SARS-CoV-2 public health emergency.  Safety protocols were in place, including screening questions prior to the visit, additional usage of staff PPE, and extensive cleaning of exam room while observing appropriate contact time as indicated for disinfecting solutions.    HPI Donald [name] presents for f/up -   He continues to complain of arthralgias and would like to continue taking prednisone.  He has been referred to rheumatology.  He otherwise feels well and offers no other complaints.  Outpatient Medications Prior to Visit  Medication Sig Dispense Refill   buprenorphine-naloxone (SUBOXONE) 8-2 mg SUBL SL tablet Place 1 tablet under the tongue daily.     celecoxib (CELEBREX) 100 MG capsule Take 1 capsule (100 mg total) by mouth 2 (two) times daily. 180 capsule 0   Cholecalciferol (VITAMIN D3) 125 MCG (5000 UT) CAPS Take 5,000 Units by mouth in the morning and at bedtime.     Cyanocobalamin (B-12 PO) Take 4 drops by mouth daily.     fluticasone (FLONASE) 50 MCG/ACT nasal spray Place 2 sprays into both nostrils daily as needed for allergies or rhinitis.     Folic Acid-Vit H6-PRF F63 0.5-5-0.2 MG TABS Take 1 tablet by mouth daily. B6 Complex     hydrochlorothiazide (HYDRODIURIL) 25 MG tablet TAKE 1 TABLET(25 MG) BY MOUTH EVERY MORNING (Patient taking differently: Take 25 mg by mouth every morning. TAKE 1 TABLET(25 MG) BY MOUTH EVERY MORNING) 90 tablet 1   losartan (COZAAR) 25 MG tablet TAKE 1 TABLET(25 MG) BY MOUTH DAILY AT 10 PM 90 tablet 1   Multiple Vitamins-Minerals (MULTIVITAMIN WITH MINERALS) tablet Take 1 tablet by mouth daily.     NON FORMULARY Take 5 drops by mouth daily. Vitamin A,D,K daily     Omega 3-6-9 Fatty Acids (OMEGA-3-6-9 PO) Take 15 mLs by mouth daily. 15.9 g     omeprazole (PRILOSEC) 20 MG capsule Take 20 mg by  mouth daily as needed (acid reflux).  1   rosuvastatin (CRESTOR) 10 MG tablet Take 10 mg by mouth at bedtime.     tamsulosin (FLOMAX) 0.4 MG CAPS capsule Take 1 capsule (0.4 mg total) by mouth daily. 90 capsule 0   Turmeric 500 MG CAPS Take 1,000 mg by mouth daily.     predniSONE (DELTASONE) 5 MG tablet Take 1 tablet (5 mg total) by mouth 2 (two) times daily with a meal. 60 tablet 1   tirzepatide (MOUNJARO) 5 MG/0.5ML Pen Inject 5 mg into the skin once a week. 6 mL 1   lactated ringers infusion      No facility-administered medications prior to visit.    ROS Review of Systems  Constitutional: Negative.  Negative for diaphoresis and fatigue.  HENT: Negative.    Eyes: Negative.   Respiratory:  Negative for cough, shortness of breath and wheezing.   Cardiovascular:  Negative for chest pain, palpitations and leg swelling.  Gastrointestinal:  Negative for abdominal pain, diarrhea, nausea and vomiting.  Endocrine: Negative.   Genitourinary: Negative.   Musculoskeletal:  Positive for arthralgias. Negative for myalgias.  Skin: Negative.   Neurological:  Negative for dizziness, weakness, light-headedness and numbness.  Hematological:  Negative for adenopathy. Does not bruise/bleed easily.  Psychiatric/Behavioral: Negative.     Objective:  BP 118/72 (BP Location: Right Arm, Patient Position: Sitting, Cuff Size: Large)  Pulse 72    Temp 98.2 F (36.8 C) (Oral)    Ht 5\' 10"  (1.778 m)    Wt 229 lb 9.6 oz (104.1 kg)    SpO2 99%    BMI 32.94 kg/m   BP Readings from Last 3 Encounters:  07/27/21 118/72  07/01/21 134/75  06/10/21 130/74    Wt Readings from Last 3 Encounters:  07/27/21 229 lb 9.6 oz (104.1 kg)  07/01/21 235 lb (106.6 kg)  06/10/21 236 lb (107 kg)    Physical Exam Vitals reviewed.  HENT:     Nose: Nose normal.     Mouth/Throat:     Mouth: Mucous membranes are moist.  Eyes:     General: No scleral icterus.    Conjunctiva/sclera: Conjunctivae normal.  Cardiovascular:      Rate and Rhythm: Normal rate and regular rhythm.     Heart sounds: No murmur heard. Pulmonary:     Effort: Pulmonary effort is normal.     Breath sounds: No stridor. No wheezing, rhonchi or rales.  Abdominal:     General: Abdomen is flat.     Palpations: There is no mass.     Tenderness: There is no abdominal tenderness. There is no guarding.     Hernia: No hernia is present.  Musculoskeletal:        General: No swelling or deformity.     Cervical back: Neck supple.     Right lower leg: No edema.     Left lower leg: No edema.  Lymphadenopathy:     Cervical: No cervical adenopathy.  Skin:    General: Skin is warm and dry.  Neurological:     General: No focal deficit present.     Mental Status: He is alert.  Psychiatric:        Mood and Affect: Mood normal.        Behavior: Behavior normal.    Lab Results  Component Value Date   WBC 9.8 03/25/2021   HGB 13.0 03/25/2021   HCT 40.6 03/25/2021   PLT 228 03/25/2021   GLUCOSE 108 (H) 07/27/2021   CHOL 168 09/30/2020   TRIG 72.0 09/30/2020   HDL 55.70 09/30/2020   LDLCALC 98 09/30/2020   ALT 17 03/25/2021   AST 25 03/25/2021   NA 139 07/27/2021   K 4.1 07/27/2021   CL 98 07/27/2021   CREATININE 0.92 07/27/2021   BUN 16 07/27/2021   CO2 38 (H) 07/27/2021   TSH 1.02 09/30/2020   PSA 0.59 09/30/2020   INR 1.0 02/04/2021   HGBA1C 6.4 07/27/2021   MICROALBUR <0.7 09/30/2020    CT RENAL STONE STUDY  Result Date: 03/30/2021 CLINICAL DATA:  66 year old male with history of right-sided flank pain for the past 6 days. EXAM: CT ABDOMEN AND PELVIS WITHOUT CONTRAST TECHNIQUE: Multidetector CT imaging of the abdomen and pelvis was performed following the standard protocol without IV contrast. COMPARISON:  CT of the abdomen and pelvis 03/25/2021. FINDINGS: Lower chest: Unremarkable. Hepatobiliary: No suspicious cystic or solid hepatic lesions are confidently identified on today's noncontrast CT examination. Unenhanced appearance  of the gallbladder is normal. Pancreas: No definite pancreatic mass or peripancreatic fluid collections or inflammatory changes are noted on today's noncontrast CT examination. Spleen: Unremarkable. Adrenals/Urinary Tract: There are no abnormal calcifications within the collecting system of either kidney, along the course of either ureter, or within the lumen of the urinary bladder. No hydroureteronephrosis or perinephric stranding to suggest urinary tract obstruction at this time. The unenhanced  appearance of the kidneys is unremarkable bilaterally. Unenhanced appearance of the urinary bladder is grossly unremarkable (urinary bladder is nearly completely decompressed and partially obscured by beam hardening artifact from the patient's right hip arthroplasty). Bilateral adrenal glands are normal in appearance. Stomach/Bowel: Unenhanced appearance of the stomach is normal. No pathologic dilatation of small bowel or colon. Status post right hemicolectomy. Vascular/Lymphatic: Atherosclerotic calcifications in the pelvic vasculature. No lymphadenopathy noted in the abdomen or pelvis. Reproductive: Prostate gland and seminal vesicles are unremarkable in appearance. Other: No significant volume of ascites.  No pneumoperitoneum. Musculoskeletal: Posterior fixation hardware is noted extending from L4-S1. Status post right hip arthroplasty. There are no aggressive appearing lytic or blastic lesions noted in the visualized portions of the skeleton. IMPRESSION: 1. No acute findings are noted in the abdomen or pelvis to account for the patient's symptoms. Specifically, no urinary tract calculi no findings of urinary tract obstruction are noted at this time. 2. Atherosclerosis. 3. Additional incidental findings, as above. Electronically Signed   By: Vinnie Langton M.D.   On: 03/30/2021 11:36    Assessment & Plan:   Tyresse was seen today for hypertension and diabetes.  Diagnoses and all orders for this visit:  Primary  hypertension- His blood pressure is adequately well controlled. -     Basic metabolic panel; Future -     Basic metabolic panel  Type 2 diabetes mellitus with hyperglycemia, without long-term current use of insulin ([place])- His blood sugar is adequately well controlled. -     Hemoglobin A1c; Future -     Basic metabolic panel; Future -     tirzepatide (MOUNJARO) 7.5 MG/0.5ML Pen; Inject 7.5 mg into the skin once a week. -     Basic metabolic panel -     Hemoglobin A1c  Primary osteoarthritis involving multiple joints -     predniSONE (DELTASONE) 5 MG tablet; Take 1 tablet (5 mg total) by mouth daily with breakfast.  Need for shingles vaccine -     Zoster Vaccine Adjuvanted [facility]) injection; Inject 0.5 mLs into the muscle once for 1 dose.   I have discontinued Davene Costain. [place]'s tirzepatide. I have also changed his predniSONE. Additionally, I am having him start on tirzepatide and Shingrix. Lastly, I am having him maintain his buprenorphine-naloxone, omeprazole, NON FORMULARY, rosuvastatin, losartan, hydrochlorothiazide, multivitamin with minerals, Omega 3-6-9 Fatty Acids (OMEGA-3-6-9 PO), Turmeric, Cyanocobalamin (O-12 PO), Folic Acid-Vit Y4-MGN O03, Vitamin D3, fluticasone, celecoxib, and tamsulosin. We will stop administering lactated ringers.  Meds ordered this encounter  Medications   predniSONE (DELTASONE) 5 MG tablet    Sig: Take 1 tablet (5 mg total) by mouth daily with breakfast.    Dispense:  30 tablet    Refill:  1   tirzepatide (MOUNJARO) 7.5 MG/0.5ML Pen    Sig: Inject 7.5 mg into the skin once a week.    Dispense:  6 mL    Refill:  0   Zoster Vaccine Adjuvanted [facility]) injection    Sig: Inject 0.5 mLs into the muscle once for 1 dose.    Dispense:  0.5 mL    Refill:  1     Follow-up: Return in about 6 months (around 01/24/2022).  Scarlette Calico, MD

## 2021-07-28 LAB — BASIC METABOLIC PANEL
BUN: 16 mg/dL (ref 6–23)
CO2: 38 mEq/L — ABNORMAL HIGH (ref 19–32)
Calcium: 9.5 mg/dL (ref 8.4–10.5)
Chloride: 98 mEq/L (ref 96–112)
Creatinine, Ser: 0.92 mg/dL (ref 0.40–1.50)
GFR: 86.45 mL/min (ref >60.00)
Glucose, Bld: 108 mg/dL — ABNORMAL HIGH (ref 70–99)
Potassium: 4.1 mEq/L (ref 3.5–5.1)
Sodium: 139 mEq/L (ref 135–145)

## 2021-07-28 LAB — HEMOGLOBIN A1C: Hgb A1c MFr Bld: 6.4 % (ref 4.6–6.5)

## 2021-08-05 ENCOUNTER — Telehealth: Payer: Self-pay | Admitting: Internal Medicine

## 2021-08-05 DIAGNOSIS — N201 Calculus of ureter: Secondary | ICD-10-CM

## 2021-08-05 DIAGNOSIS — N401 Enlarged prostate with lower urinary tract symptoms: Secondary | ICD-10-CM

## 2021-08-05 MED ORDER — TAMSULOSIN HCL 0.4 MG PO CAPS: 0.4000 mg | ORAL_CAPSULE | Freq: Every day | ORAL | 0 refills | Status: DC

## 2021-08-05 NOTE — Telephone Encounter (Signed)
1.Medication Requested: tamsulosin (FLOMAX) 0.4 MG CAPS capsule ? ?2. Pharmacy (Name, Street, [place]):  ?[facility], [place]  ?Phone:  [phone] ?Fax:  [phone] ? ? ?3. On Med List:  yes ? ?4. Last Visit with PCP: 02.22.23 ? ?5. Next visit date with PCP: 08.23.23 ? ? ?Agent: Please be advised that RX refills may take up to 3 business days. We ask that you follow-up with your pharmacy.  ?

## 2021-08-08 ENCOUNTER — Telehealth: Payer: Self-pay

## 2021-08-08 MED ORDER — OMEPRAZOLE 20 MG PO CPDR: 20.0000 mg | DELAYED_RELEASE_CAPSULE | Freq: Every day | ORAL | 1 refills | Status: DC | PRN

## 2021-08-08 NOTE — Telephone Encounter (Signed)
Pt is requesting a refill for: ?omeprazole (PRILOSEC) 20 MG capsule ? ?Pharmacy: ?[facility], [place] ? ?LOV 07/27/21 ?

## 2021-08-15 ENCOUNTER — Other Ambulatory Visit: Payer: Self-pay | Admitting: Internal Medicine

## 2021-08-15 ENCOUNTER — Other Ambulatory Visit: Payer: Self-pay | Admitting: Cardiology

## 2021-08-15 DIAGNOSIS — I10 Essential (primary) hypertension: Secondary | ICD-10-CM

## 2021-08-17 DIAGNOSIS — M961 Postlaminectomy syndrome, not elsewhere classified: Secondary | ICD-10-CM | POA: Diagnosis not present

## 2021-08-17 DIAGNOSIS — M5416 Radiculopathy, lumbar region: Secondary | ICD-10-CM | POA: Diagnosis not present

## 2021-08-17 DIAGNOSIS — M47816 Spondylosis without myelopathy or radiculopathy, lumbar region: Secondary | ICD-10-CM | POA: Diagnosis not present

## 2021-08-17 DIAGNOSIS — Z79899 Other long term (current) drug therapy: Secondary | ICD-10-CM | POA: Diagnosis not present

## 2021-08-18 ENCOUNTER — Ambulatory Visit: Payer: Medicare HMO | Admitting: Rheumatology

## 2021-08-23 ENCOUNTER — Encounter: Payer: Self-pay | Admitting: Internal Medicine

## 2021-08-25 ENCOUNTER — Other Ambulatory Visit: Payer: Self-pay | Admitting: Internal Medicine

## 2021-08-25 DIAGNOSIS — M1612 Unilateral primary osteoarthritis, left hip: Secondary | ICD-10-CM

## 2021-08-25 DIAGNOSIS — M1611 Unilateral primary osteoarthritis, right hip: Secondary | ICD-10-CM

## 2021-09-06 ENCOUNTER — Telehealth: Payer: Self-pay | Admitting: Internal Medicine

## 2021-09-06 ENCOUNTER — Other Ambulatory Visit: Payer: Self-pay | Admitting: Internal Medicine

## 2021-09-06 DIAGNOSIS — M159 Polyosteoarthritis, unspecified: Secondary | ICD-10-CM

## 2021-09-06 MED ORDER — PREDNISONE 5 MG PO TABS: 5.0000 mg | ORAL_TABLET | Freq: Every day | ORAL | 0 refills | Status: DC

## 2021-09-06 NOTE — Telephone Encounter (Signed)
1.Medication Requested: predniSONE (DELTASONE) 5 MG tablet ? ?2. Pharmacy (Name, Street, Stella): [place], [place] ? ?3. On Med List: Y ? ?4. Last Visit with PCP: 07-27-2021 ? ?5. Next visit date with PCP: 01-25-2022 ? ? ?Agent: Please be advised that RX refills may take up to 3 business days. We ask that you follow-up with your pharmacy.  ?

## 2021-09-15 ENCOUNTER — Encounter: Payer: Self-pay | Admitting: Internal Medicine

## 2021-09-15 ENCOUNTER — Ambulatory Visit (INDEPENDENT_AMBULATORY_CARE_PROVIDER_SITE_OTHER): Payer: Medicare HMO

## 2021-09-15 ENCOUNTER — Ambulatory Visit: Payer: Medicare HMO | Admitting: Rheumatology

## 2021-09-15 ENCOUNTER — Ambulatory Visit (INDEPENDENT_AMBULATORY_CARE_PROVIDER_SITE_OTHER): Payer: Medicare HMO | Admitting: Internal Medicine

## 2021-09-15 VITALS — BP 118/68 | HR 80 | Temp 97.4°F | Ht 70.0 in | Wt 229.0 lb

## 2021-09-15 DIAGNOSIS — E1165 Type 2 diabetes mellitus with hyperglycemia: Secondary | ICD-10-CM

## 2021-09-15 DIAGNOSIS — M7521 Bicipital tendinitis, right shoulder: Secondary | ICD-10-CM | POA: Diagnosis not present

## 2021-09-15 DIAGNOSIS — M79672 Pain in left foot: Secondary | ICD-10-CM | POA: Diagnosis not present

## 2021-09-15 DIAGNOSIS — M159 Polyosteoarthritis, unspecified: Secondary | ICD-10-CM

## 2021-09-15 DIAGNOSIS — M150 Primary generalized (osteo)arthritis: Secondary | ICD-10-CM

## 2021-09-15 DIAGNOSIS — M069 Rheumatoid arthritis, unspecified: Secondary | ICD-10-CM | POA: Diagnosis not present

## 2021-09-15 DIAGNOSIS — M79671 Pain in right foot: Secondary | ICD-10-CM

## 2021-09-15 DIAGNOSIS — M7989 Other specified soft tissue disorders: Secondary | ICD-10-CM | POA: Diagnosis not present

## 2021-09-15 MED ORDER — PREDNISONE 5 MG PO TABS: 10.0000 mg | ORAL_TABLET | Freq: Every day | ORAL | 0 refills | Status: DC
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
## 2021-09-15 NOTE — Patient Instructions (Signed)
Please continue all other medications as before, and refills have been done if requested - the prednisone ? ?Please see if your Rheumatologist will address the right shoulder Bicipital Tendonitis ? ?You will be contacted regarding the referral for: Dr Angela Cox for the right foot ? ?Please have the pharmacy call with any other refills you may need. ? ?Please continue your efforts at being more active, low cholesterol diet, and weight control. ? ?Please keep your appointments with your specialists as you may have planned ? ?Please go to the XRAY Department in the first floor for the x-ray testing ? ?You will be contacted by phone if any changes need to be made immediately.  Otherwise, you will receive a letter about your results with an explanation, but please check with MyChart first. ? ?Please remember to sign up for MyChart if you have not done so, as this will be important to you in the future with finding out test results, communicating by private email, and scheduling acute appointments online when needed. ? ? ? ? ? ?
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
## 2021-09-16 ENCOUNTER — Telehealth: Payer: Self-pay

## 2021-09-16 DIAGNOSIS — E1165 Type 2 diabetes mellitus with hyperglycemia: Secondary | ICD-10-CM

## 2021-09-16 MED ORDER — TIRZEPATIDE 7.5 MG/0.5ML ~~LOC~~ SOAJ: 7.5000 mg | SUBCUTANEOUS | 0 refills | Status: DC

## 2021-09-16 NOTE — Telephone Encounter (Signed)
Done.  ? ?[facility]  ?

## 2021-09-16 NOTE — Telephone Encounter (Signed)
Pt is requesting the Rx for tirzepatide [facility]) 7.5 MG/0.5ML Pen is on back order at current pharmacy and [id number] [place] ?Please advise ?

## 2021-09-20 ENCOUNTER — Encounter: Payer: Self-pay | Admitting: Internal Medicine

## 2021-09-20 DIAGNOSIS — M069 Rheumatoid arthritis, unspecified: Secondary | ICD-10-CM | POA: Insufficient documentation

## 2021-09-20 DIAGNOSIS — M7521 Bicipital tendinitis, right shoulder: Secondary | ICD-10-CM | POA: Insufficient documentation

## 2021-09-20 NOTE — Assessment & Plan Note (Signed)
D/w pt, has appt with rheum who can do cortisone injection, or see sports medicine ?

## 2021-09-20 NOTE — Assessment & Plan Note (Signed)
Lab Results  ?Component Value Date  ? HGBA1C 6.4 07/27/2021  ? ?Stable, pt to continue current medical treatment mounjaro ? ?

## 2021-09-20 NOTE — Assessment & Plan Note (Addendum)
But worse with first MTP bunion flare - for right foot film, and ortho referral Dr Doran Durand, uspect may need bunionectomy ?

## 2021-09-20 NOTE — Assessment & Plan Note (Signed)
Mild bilateral hands - for prednisone asd, f//u rheum as planned ?

## 2021-09-29 ENCOUNTER — Encounter: Payer: Self-pay | Admitting: Rheumatology

## 2021-09-29 ENCOUNTER — Ambulatory Visit: Payer: Medicare HMO | Admitting: Rheumatology

## 2021-09-29 ENCOUNTER — Ambulatory Visit (INDEPENDENT_AMBULATORY_CARE_PROVIDER_SITE_OTHER): Payer: Medicare HMO

## 2021-09-29 VITALS — BP 120/75 | HR 83 | Resp 17 | Ht 67.5 in | Wt 228.0 lb

## 2021-09-29 DIAGNOSIS — G894 Chronic pain syndrome: Secondary | ICD-10-CM | POA: Diagnosis not present

## 2021-09-29 DIAGNOSIS — M5030 Other cervical disc degeneration, unspecified cervical region: Secondary | ICD-10-CM | POA: Diagnosis not present

## 2021-09-29 DIAGNOSIS — K7400 Hepatic fibrosis, unspecified: Secondary | ICD-10-CM

## 2021-09-29 DIAGNOSIS — G8929 Other chronic pain: Secondary | ICD-10-CM

## 2021-09-29 DIAGNOSIS — I10 Essential (primary) hypertension: Secondary | ICD-10-CM | POA: Diagnosis not present

## 2021-09-29 DIAGNOSIS — R350 Frequency of micturition: Secondary | ICD-10-CM

## 2021-09-29 DIAGNOSIS — Z96641 Presence of right artificial hip joint: Secondary | ICD-10-CM | POA: Diagnosis not present

## 2021-09-29 DIAGNOSIS — M79642 Pain in left hand: Secondary | ICD-10-CM

## 2021-09-29 DIAGNOSIS — K219 Gastro-esophageal reflux disease without esophagitis: Secondary | ICD-10-CM

## 2021-09-29 DIAGNOSIS — M1612 Unilateral primary osteoarthritis, left hip: Secondary | ICD-10-CM | POA: Diagnosis not present

## 2021-09-29 DIAGNOSIS — M25511 Pain in right shoulder: Secondary | ICD-10-CM

## 2021-09-29 DIAGNOSIS — M79641 Pain in right hand: Secondary | ICD-10-CM

## 2021-09-29 DIAGNOSIS — B182 Chronic viral hepatitis C: Secondary | ICD-10-CM

## 2021-09-29 DIAGNOSIS — M26621 Arthralgia of right temporomandibular joint: Secondary | ICD-10-CM

## 2021-09-29 DIAGNOSIS — M5136 Other intervertebral disc degeneration, lumbar region: Secondary | ICD-10-CM | POA: Diagnosis not present

## 2021-09-29 DIAGNOSIS — M25561 Pain in right knee: Secondary | ICD-10-CM

## 2021-09-29 DIAGNOSIS — E1165 Type 2 diabetes mellitus with hyperglycemia: Secondary | ICD-10-CM

## 2021-09-29 DIAGNOSIS — M25562 Pain in left knee: Secondary | ICD-10-CM

## 2021-09-29 DIAGNOSIS — N401 Enlarged prostate with lower urinary tract symptoms: Secondary | ICD-10-CM

## 2021-09-29 DIAGNOSIS — E785 Hyperlipidemia, unspecified: Secondary | ICD-10-CM

## 2021-09-29 DIAGNOSIS — G4731 Primary central sleep apnea: Secondary | ICD-10-CM

## 2021-09-29 DIAGNOSIS — F4310 Post-traumatic stress disorder, unspecified: Secondary | ICD-10-CM

## 2021-09-29 DIAGNOSIS — M1611 Unilateral primary osteoarthritis, right hip: Secondary | ICD-10-CM

## 2021-09-29 DIAGNOSIS — I119 Hypertensive heart disease without heart failure: Secondary | ICD-10-CM

## 2021-10-01 LAB — URIC ACID: Uric Acid, Serum: 3.8 mg/dL — ABNORMAL LOW (ref 4.0–8.0)

## 2021-10-01 LAB — SEDIMENTATION RATE: Sed Rate: 45 mm/h — ABNORMAL HIGH (ref 0–20)

## 2021-10-01 LAB — RHEUMATOID FACTOR: Rheumatoid fact SerPl-aCnc: <14 IU/mL (ref <14)

## 2021-10-01 LAB — CYCLIC CITRUL PEPTIDE ANTIBODY, IGG: Cyclic Citrullin Peptide Ab: <16 UNITS

## 2021-10-02 NOTE — Progress Notes (Signed)
Sed rate is elevated, uric acid is 3.8 in the desirable range.  Rheumatoid factor is negative, anti-CCP is negative.  Patient should stay off prednisone till follow-up visit.
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
## 2021-10-12 ENCOUNTER — Telehealth: Payer: Self-pay

## 2021-10-12 ENCOUNTER — Other Ambulatory Visit: Payer: Self-pay | Admitting: Internal Medicine

## 2021-10-12 DIAGNOSIS — E1165 Type 2 diabetes mellitus with hyperglycemia: Secondary | ICD-10-CM

## 2021-10-12 MED ORDER — TIRZEPATIDE 7.5 MG/0.5ML ~~LOC~~ SOAJ: 7.5000 mg | SUBCUTANEOUS | 0 refills | Status: DC

## 2021-10-12 NOTE — Telephone Encounter (Signed)
Pt is requesting a refill on: ?tirzepatide (MOUNJARO) 7.5 MG/0.5ML Pen ? ?[id number] [place] [place] [address] ?LOV 07/27/21 ?ROV 01/25/22 ?

## 2021-10-20 ENCOUNTER — Encounter: Payer: Self-pay | Admitting: Rheumatology

## 2021-10-20 ENCOUNTER — Ambulatory Visit: Payer: Medicare HMO | Admitting: Rheumatology

## 2021-10-20 VITALS — BP 112/73 | HR 74 | Ht 69.0 in | Wt 228.4 lb

## 2021-10-20 DIAGNOSIS — G894 Chronic pain syndrome: Secondary | ICD-10-CM | POA: Diagnosis not present

## 2021-10-20 DIAGNOSIS — G4731 Primary central sleep apnea: Secondary | ICD-10-CM

## 2021-10-20 DIAGNOSIS — I119 Hypertensive heart disease without heart failure: Secondary | ICD-10-CM

## 2021-10-20 DIAGNOSIS — R700 Elevated erythrocyte sedimentation rate: Secondary | ICD-10-CM

## 2021-10-20 DIAGNOSIS — E1165 Type 2 diabetes mellitus with hyperglycemia: Secondary | ICD-10-CM

## 2021-10-20 DIAGNOSIS — M1612 Unilateral primary osteoarthritis, left hip: Secondary | ICD-10-CM | POA: Diagnosis not present

## 2021-10-20 DIAGNOSIS — M19042 Primary osteoarthritis, left hand: Secondary | ICD-10-CM

## 2021-10-20 DIAGNOSIS — M5030 Other cervical disc degeneration, unspecified cervical region: Secondary | ICD-10-CM

## 2021-10-20 DIAGNOSIS — K219 Gastro-esophageal reflux disease without esophagitis: Secondary | ICD-10-CM

## 2021-10-20 DIAGNOSIS — I10 Essential (primary) hypertension: Secondary | ICD-10-CM

## 2021-10-20 DIAGNOSIS — M170 Bilateral primary osteoarthritis of knee: Secondary | ICD-10-CM | POA: Diagnosis not present

## 2021-10-20 DIAGNOSIS — M19011 Primary osteoarthritis, right shoulder: Secondary | ICD-10-CM

## 2021-10-20 DIAGNOSIS — B182 Chronic viral hepatitis C: Secondary | ICD-10-CM

## 2021-10-20 DIAGNOSIS — E785 Hyperlipidemia, unspecified: Secondary | ICD-10-CM

## 2021-10-20 DIAGNOSIS — M19041 Primary osteoarthritis, right hand: Secondary | ICD-10-CM | POA: Diagnosis not present

## 2021-10-20 DIAGNOSIS — N401 Enlarged prostate with lower urinary tract symptoms: Secondary | ICD-10-CM

## 2021-10-20 DIAGNOSIS — Z96641 Presence of right artificial hip joint: Secondary | ICD-10-CM | POA: Diagnosis not present

## 2021-10-20 DIAGNOSIS — M5136 Other intervertebral disc degeneration, lumbar region: Secondary | ICD-10-CM

## 2021-10-20 DIAGNOSIS — F4310 Post-traumatic stress disorder, unspecified: Secondary | ICD-10-CM

## 2021-10-20 DIAGNOSIS — K7400 Hepatic fibrosis, unspecified: Secondary | ICD-10-CM

## 2021-10-20 DIAGNOSIS — R350 Frequency of micturition: Secondary | ICD-10-CM

## 2021-10-21 ENCOUNTER — Ambulatory Visit (INDEPENDENT_AMBULATORY_CARE_PROVIDER_SITE_OTHER): Payer: Medicare HMO | Admitting: Internal Medicine

## 2021-10-21 ENCOUNTER — Encounter: Payer: Self-pay | Admitting: Internal Medicine

## 2021-10-21 VITALS — BP 118/66 | HR 81 | Temp 98.1°F | Ht 69.0 in | Wt 226.0 lb

## 2021-10-21 DIAGNOSIS — I10 Essential (primary) hypertension: Secondary | ICD-10-CM | POA: Diagnosis not present

## 2021-10-21 DIAGNOSIS — G894 Chronic pain syndrome: Secondary | ICD-10-CM

## 2021-10-21 DIAGNOSIS — E119 Type 2 diabetes mellitus without complications: Secondary | ICD-10-CM

## 2021-10-21 MED ORDER — MELOXICAM 15 MG PO TABS: 15.0000 mg | ORAL_TABLET | Freq: Every day | ORAL | 3 refills | Status: DC | PRN

## 2021-10-21 NOTE — Assessment & Plan Note (Signed)
BP Readings from Last 3 Encounters:  10/21/21 118/66  10/20/21 112/73  09/29/21 120/75   Stable, pt to continue medical treatment hct, losartan
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
## 2021-10-21 NOTE — Patient Instructions (Signed)
Please take all new medication as prescribed - the meloxicam daily as needed for pain  Please continue all other medications as before, and refills have been done if requested.  Please have the pharmacy call with any other refills you may need.  Please continue your efforts at being more active, low cholesterol diet, and weight control.  Please keep your appointments with your specialists as you may have planned

## 2021-10-21 NOTE — Assessment & Plan Note (Signed)
Lab Results  Component Value Date   HGBA1C 6.4 07/27/2021   Stable, pt to continue current medical treatment mounjaro

## 2021-10-21 NOTE — Assessment & Plan Note (Signed)
D/w pt, ok for mobic 15 qd prn,  to f/u any worsening symptoms or concerns

## 2021-11-04 ENCOUNTER — Other Ambulatory Visit: Payer: Self-pay | Admitting: Internal Medicine

## 2021-11-04 DIAGNOSIS — N201 Calculus of ureter: Secondary | ICD-10-CM

## 2021-11-04 DIAGNOSIS — N401 Enlarged prostate with lower urinary tract symptoms: Secondary | ICD-10-CM

## 2021-11-12 ENCOUNTER — Other Ambulatory Visit: Payer: Self-pay | Admitting: Cardiology

## 2021-11-12 ENCOUNTER — Other Ambulatory Visit: Payer: Self-pay | Admitting: Internal Medicine

## 2021-11-12 DIAGNOSIS — E1165 Type 2 diabetes mellitus with hyperglycemia: Secondary | ICD-10-CM

## 2021-11-12 DIAGNOSIS — I10 Essential (primary) hypertension: Secondary | ICD-10-CM

## 2021-11-16 DIAGNOSIS — M961 Postlaminectomy syndrome, not elsewhere classified: Secondary | ICD-10-CM | POA: Diagnosis not present

## 2021-11-16 DIAGNOSIS — M5416 Radiculopathy, lumbar region: Secondary | ICD-10-CM | POA: Diagnosis not present

## 2021-11-16 DIAGNOSIS — Z79899 Other long term (current) drug therapy: Secondary | ICD-10-CM | POA: Diagnosis not present

## 2021-11-16 DIAGNOSIS — M47816 Spondylosis without myelopathy or radiculopathy, lumbar region: Secondary | ICD-10-CM | POA: Diagnosis not present

## 2021-11-18 ENCOUNTER — Ambulatory Visit: Payer: Medicare HMO | Admitting: Internal Medicine

## 2021-11-23 ENCOUNTER — Ambulatory Visit: Payer: Medicare HMO | Admitting: Internal Medicine

## 2021-12-07 DIAGNOSIS — E1136 Type 2 diabetes mellitus with diabetic cataract: Secondary | ICD-10-CM | POA: Diagnosis not present

## 2021-12-07 DIAGNOSIS — H52203 Unspecified astigmatism, bilateral: Secondary | ICD-10-CM | POA: Diagnosis not present

## 2021-12-07 DIAGNOSIS — H5213 Myopia, bilateral: Secondary | ICD-10-CM | POA: Diagnosis not present

## 2021-12-07 DIAGNOSIS — H524 Presbyopia: Secondary | ICD-10-CM | POA: Diagnosis not present

## 2021-12-07 DIAGNOSIS — H25813 Combined forms of age-related cataract, bilateral: Secondary | ICD-10-CM | POA: Diagnosis not present

## 2021-12-07 DIAGNOSIS — Z7984 Long term (current) use of oral hypoglycemic drugs: Secondary | ICD-10-CM | POA: Diagnosis not present

## 2021-12-15 ENCOUNTER — Ambulatory Visit (INDEPENDENT_AMBULATORY_CARE_PROVIDER_SITE_OTHER): Payer: Medicare HMO | Admitting: Nurse Practitioner

## 2021-12-15 VITALS — BP 118/70 | HR 76 | Temp 97.9°F | Ht 69.0 in | Wt 226.0 lb

## 2021-12-15 DIAGNOSIS — M7989 Other specified soft tissue disorders: Secondary | ICD-10-CM | POA: Diagnosis not present

## 2021-12-15 LAB — COMPREHENSIVE METABOLIC PANEL
ALT: 15 U/L (ref 0–53)
AST: 19 U/L (ref 0–37)
Albumin: 4.4 g/dL (ref 3.5–5.2)
Alkaline Phosphatase: 41 U/L (ref 39–117)
BUN: 16 mg/dL (ref 6–23)
CO2: 35 mEq/L — ABNORMAL HIGH (ref 19–32)
Calcium: 9.8 mg/dL (ref 8.4–10.5)
Chloride: 99 mEq/L (ref 96–112)
Creatinine, Ser: 0.97 mg/dL (ref 0.40–1.50)
GFR: 80.92 mL/min (ref >60.00)
Glucose, Bld: 105 mg/dL — ABNORMAL HIGH (ref 70–99)
Potassium: 3.9 mEq/L (ref 3.5–5.1)
Sodium: 137 mEq/L (ref 135–145)
Total Bilirubin: 0.4 mg/dL (ref 0.2–1.2)
Total Protein: 8.1 g/dL (ref 6.0–8.3)

## 2021-12-15 LAB — BRAIN NATRIURETIC PEPTIDE: Pro B Natriuretic peptide (BNP): 21 pg/mL (ref 0.0–100.0)

## 2021-12-15 NOTE — Progress Notes (Signed)
Established Patient Office Visit  Subjective   Patient ID: Donald [name], male    DOB: Jul 30, 1955  Age: 66 y.o. MRN: [id number]  Chief Complaint  Patient presents with   possible allergic reaction    Meloxicam, causing knots on legs   Patient has today for the above.  He reports he started meloxicam approximately 3 weeks ago.  He noticed 2 "knots"/masses on his left lower extremity 1 in the front and 1 in the back approximately 2 weeks ago.  He stopped meloxicam for about 2 days but no changes occurred to the not so he restarted it.  He was prescribed this for treatment of his knee, hip, back arthritis and reports improvement in symptoms.  He is here today to have the "knots"/masses evaluated.  He denies chest pain, shortness of breath, pain in his left lower extremity, or itching.  He also reports that the knot symptoms seem to be mobile.  He denies any recent long travel, but does report that he drives from [place] to [place] twice a day.    Review of Systems  Respiratory:  Negative for cough and shortness of breath.   Cardiovascular:  Positive for orthopnea (chronic stable), leg swelling and PND (occured 1 week ago has resolved since restarting to use the cpap). Negative for chest pain and palpitations.      Objective:     BP 118/70 (BP Location: Left Arm, Patient Position: Sitting, Cuff Size: Large)   Pulse 76   Temp 97.9 F (36.6 C) (Oral)   Ht '5\' 9"'$  (1.753 m)   Wt 226 lb (102.5 kg)   SpO2 96%   BMI 33.37 kg/m    Physical Exam Vitals reviewed.  Constitutional:      Appearance: Normal appearance.  HENT:     Head: Normocephalic and atraumatic.  Cardiovascular:     Rate and Rhythm: Normal rate and regular rhythm.     Comments: Hair loss to the bilateral lower extremities Pulmonary:     Effort: Pulmonary effort is normal.     Breath sounds: Normal breath sounds.  Musculoskeletal:     Cervical back: Neck supple.     Right lower leg: Edema present.     Left  lower leg: Edema present.  Skin:    General: Skin is warm and dry.       Neurological:     Mental Status: He is alert and oriented to person, place, and time.  Psychiatric:        Mood and Affect: Mood normal.        Behavior: Behavior normal.        Thought Content: Thought content normal.        Judgment: Judgment normal.      No results found for any visits on 12/15/21.    The 10-year ASCVD risk score (Arnett DK, et al., 2019) is: 24.6%    Assessment & Plan:   Problem List Items Addressed This Visit       Other   Left leg swelling - Primary    Acute, etiology unclear.  I think low likelihood this is a medication reaction and does not present as an allergic reaction.  Will order blood work including CMP, D-dimer, and BNP for further evaluation.  We will also order ultrasound to rule out DVT.  Further recommendations will be made based upon these results.  Patient told he can most likely continue on meloxicam at this time.  If you would like to trial  a drug holiday recommend he stop meloxicam for at least 5 days, and then consider restarting it.  If he notices worsening of the masses to his lower extremity related to restarting meloxicam he should discontinue it at that time.  If all work-up returns normal would recommend consider referral to dermatology.  Patient will follow-up for close monitoring in 2 weeks.      Relevant Orders   D-dimer, quantitative   VAS [place] LOWER EXTREMITY VENOUS (DVT)   Comprehensive metabolic panel   Brain natriuretic peptide    Return in about 1 week (around 12/22/2021) for 1-2 weeks with Judson Roch.    Ailene Ards, NP

## 2021-12-15 NOTE — Assessment & Plan Note (Addendum)
Acute, etiology unclear.  I think low likelihood this is a medication reaction and does not present as an allergic reaction.  Will order blood work including CMP, D-dimer, and BNP for further evaluation.  We will also order ultrasound to rule out DVT.  Further recommendations will be made based upon these results.  Patient told he can most likely continue on meloxicam at this time.  If you would like to trial a drug holiday recommend he stop meloxicam for at least 5 days, and then consider restarting it.  If he notices worsening of the masses to his lower extremity related to restarting meloxicam he should discontinue it at that time.  If all work-up returns normal would recommend consider referral to dermatology.  Patient will follow-up for close monitoring in 2 weeks.

## 2021-12-16 LAB — D-DIMER, QUANTITATIVE: D-Dimer, Quant: 0.47 mcg/mL FEU (ref <0.50)

## 2021-12-22 ENCOUNTER — Ambulatory Visit (INDEPENDENT_AMBULATORY_CARE_PROVIDER_SITE_OTHER): Payer: Medicare HMO | Admitting: Nurse Practitioner

## 2021-12-22 VITALS — BP 120/68 | HR 76 | Temp 98.2°F | Ht 69.0 in | Wt 226.0 lb

## 2021-12-22 DIAGNOSIS — M7989 Other specified soft tissue disorders: Secondary | ICD-10-CM | POA: Diagnosis not present

## 2021-12-22 LAB — HM DIABETES EYE EXAM

## 2021-12-22 NOTE — Assessment & Plan Note (Signed)
Etiology still unclear.  For now would recommend he discontinue meloxicam until etiology determined.  He will undergo ultrasound tomorrow, may consider follow-up or additional imaging if ultrasound is negative tomorrow.  Patient expresses understanding.  We also consider referral to dermatology.  Patient to follow-up as scheduled with PCP next month or sooner depending on recommendations following his upcoming ultrasound.

## 2021-12-22 NOTE — Progress Notes (Signed)
    Established Patient Office Visit  Subjective   Patient ID: Donald [name], male    DOB: 04-20-1956  Age: 66 y.o. MRN: [id number]  Chief Complaint  Patient presents with   1wk follow up    Patient arives today for the above.  He was seen last week for evaluation of knots/masses on his left lower extremity.  He noticed these popped up after starting meloxicam.  They abrupted after being on meloxicam for about 1 week.  Since I saw him at last office visit he has continued on the meloxicam, and reports that he has additional lesion now to his right forearm.  Blood work was collected at last office visit which showed negative D-dimer, stable kidney function, normal BNP.  He continues to have joint pain, he has significant osteoarthritis.  He has already tried Celebrex, Voltaren gel, joint injections (steroids and gel), prednisone by mouth, and Tylenol.  He was told that he would most likely require surgical intervention however is not wanting to undergo surgery at this time.  He has vascular ultrasound scheduled for tomorrow for further evaluation of the knot.    ROS: See HPI    Objective:     BP 120/68 (BP Location: Right Arm, Patient Position: Sitting, Cuff Size: Large)   Pulse 76   Temp 98.2 F (36.8 C) (Oral)   Ht '5\' 9"'$  (1.753 m)   Wt 226 lb (102.5 kg)   SpO2 95%   BMI 33.37 kg/m    Physical Exam Vitals reviewed.  Constitutional:      Appearance: Normal appearance.  HENT:     Head: Normocephalic and atraumatic.  Cardiovascular:     Rate and Rhythm: Normal rate and regular rhythm.  Pulmonary:     Effort: Pulmonary effort is normal.     Breath sounds: Normal breath sounds.  Musculoskeletal:     Cervical back: Neck supple.  Skin:    General: Skin is warm and dry.       Neurological:     Mental Status: He is alert and oriented to person, place, and time.  Psychiatric:        Mood and Affect: Mood normal.        Behavior: Behavior normal.        Thought Content:  Thought content normal.        Judgment: Judgment normal.      No results found for any visits on 12/22/21.    The 10-year ASCVD risk score (Arnett DK, et al., 2019) is: 25.3%    Assessment & Plan:   Problem List Items Addressed This Visit       Other   Left leg swelling - Primary    Etiology still unclear.  For now would recommend he discontinue meloxicam until etiology determined.  He will undergo ultrasound tomorrow, may consider follow-up or additional imaging if ultrasound is negative tomorrow.  Patient expresses understanding.  We also consider referral to dermatology.  Patient to follow-up as scheduled with PCP next month or sooner depending on recommendations following his upcoming ultrasound.       Return if symptoms worsen or fail to improve.    Ailene Ards, NP

## 2021-12-23 ENCOUNTER — Telehealth: Payer: Self-pay | Admitting: Internal Medicine

## 2021-12-23 ENCOUNTER — Encounter (HOSPITAL_COMMUNITY): Payer: Medicare HMO

## 2021-12-23 NOTE — Telephone Encounter (Signed)
LVM for Donald [name] to return my call to discuss.

## 2021-12-23 NOTE — Telephone Encounter (Signed)
[facility] called and is requesting a call back from assistant because pt called their office to cx appt for today.   Requesting message be delayed to MD to ensure that no further information is to be given to pt, a this is a serious matter.

## 2022-01-25 ENCOUNTER — Encounter: Payer: Self-pay | Admitting: Internal Medicine

## 2022-01-25 ENCOUNTER — Ambulatory Visit (INDEPENDENT_AMBULATORY_CARE_PROVIDER_SITE_OTHER): Payer: Medicare HMO | Admitting: Internal Medicine

## 2022-01-25 VITALS — BP 128/84 | HR 78 | Temp 98.2°F | Resp 16 | Ht 69.0 in | Wt 226.0 lb

## 2022-01-25 DIAGNOSIS — E119 Type 2 diabetes mellitus without complications: Secondary | ICD-10-CM | POA: Diagnosis not present

## 2022-01-25 DIAGNOSIS — I10 Essential (primary) hypertension: Secondary | ICD-10-CM

## 2022-01-25 DIAGNOSIS — E785 Hyperlipidemia, unspecified: Secondary | ICD-10-CM

## 2022-01-25 DIAGNOSIS — L03116 Cellulitis of left lower limb: Secondary | ICD-10-CM | POA: Insufficient documentation

## 2022-01-25 DIAGNOSIS — Z0001 Encounter for general adult medical examination with abnormal findings: Secondary | ICD-10-CM | POA: Diagnosis not present

## 2022-01-25 DIAGNOSIS — N401 Enlarged prostate with lower urinary tract symptoms: Secondary | ICD-10-CM

## 2022-01-25 DIAGNOSIS — R350 Frequency of micturition: Secondary | ICD-10-CM

## 2022-01-25 DIAGNOSIS — N400 Enlarged prostate without lower urinary tract symptoms: Secondary | ICD-10-CM

## 2022-01-25 LAB — TSH: TSH: 1.01 u[IU]/mL (ref 0.35–5.50)

## 2022-01-25 LAB — CBC WITH DIFFERENTIAL/PLATELET
Basophils Absolute: 0 10*3/uL (ref 0.0–0.1)
Basophils Relative: 0.2 % (ref 0.0–3.0)
Eosinophils Absolute: 0.1 10*3/uL (ref 0.0–0.7)
Eosinophils Relative: 1.3 % (ref 0.0–5.0)
HCT: 38.6 % — ABNORMAL LOW (ref 39.0–52.0)
Hemoglobin: 12.5 g/dL — ABNORMAL LOW (ref 13.0–17.0)
Lymphocytes Relative: 42.7 % (ref 12.0–46.0)
Lymphs Abs: 2.7 10*3/uL (ref 0.7–4.0)
MCHC: 32.3 g/dL (ref 30.0–36.0)
MCV: 88.7 fl (ref 78.0–100.0)
Monocytes Absolute: 0.5 10*3/uL (ref 0.1–1.0)
Monocytes Relative: 7.5 % (ref 3.0–12.0)
Neutro Abs: 3.1 10*3/uL (ref 1.4–7.7)
Neutrophils Relative %: 48.3 % (ref 43.0–77.0)
Platelets: 167 10*3/uL (ref 150.0–400.0)
RBC: 4.35 Mil/uL (ref 4.22–5.81)
RDW: 12.9 % (ref 11.5–15.5)
WBC: 6.4 10*3/uL (ref 4.0–10.5)

## 2022-01-25 LAB — LIPID PANEL
Cholesterol: 109 mg/dL (ref 0–200)
HDL: 48.5 mg/dL (ref >39.00)
LDL Cholesterol: 40 mg/dL (ref 0–99)
NonHDL: 60.86
Total CHOL/HDL Ratio: 2
Triglycerides: 103 mg/dL (ref 0.0–149.0)
VLDL: 20.6 mg/dL (ref 0.0–40.0)

## 2022-01-25 LAB — MICROALBUMIN / CREATININE URINE RATIO
Creatinine,U: 187.1 mg/dL
Microalb Creat Ratio: 0.4 mg/g (ref 0.0–30.0)
Microalb, Ur: 0.8 mg/dL (ref 0.0–1.9)

## 2022-01-25 LAB — PSA: PSA: 0.75 ng/mL (ref 0.10–4.00)

## 2022-01-25 MED ORDER — SULFAMETHOXAZOLE-TRIMETHOPRIM 800-160 MG PO TABS: 1.0000 | ORAL_TABLET | Freq: Two times a day (BID) | ORAL | 0 refills | Status: AC

## 2022-01-25 NOTE — Patient Instructions (Signed)

## 2022-01-25 NOTE — Progress Notes (Unsigned)
Subjective:  Patient ID: Donald [name], male    DOB: [birth year]  Age: 66 y.o. MRN: [id number]  CC: Annual Exam   HPI Donald [name] presents for a CPX and f/up -  He complains of a 1 day history of painful swelling in his left foot.  He denies trauma or injury.  He is active and denies chest pain, shortness of breath, diaphoresis, or edema.  Outpatient Medications Prior to Visit  Medication Sig Dispense Refill   buprenorphine-naloxone (SUBOXONE) 8-2 mg SUBL SL tablet Place 1 tablet under the tongue daily.     Cholecalciferol (VITAMIN D3) 125 MCG (5000 UT) CAPS Take 5,000 Units by mouth in the morning and at bedtime.     Cyanocobalamin (B-12 PO) Take 4 drops by mouth daily.     fluticasone (FLONASE) 50 MCG/ACT nasal spray Place 2 sprays into both nostrils daily as needed for allergies or rhinitis.     Folic Acid-Vit T4-HDQ Q22 0.5-5-0.2 MG TABS Take 1 tablet by mouth daily. B6 Complex     hydrochlorothiazide (HYDRODIURIL) 25 MG tablet TAKE 1 TABLET(25 MG) BY MOUTH EVERY MORNING 90 tablet 1   losartan (COZAAR) 25 MG tablet TAKE 1 TABLET(25 MG) BY MOUTH DAILY AT 10 PM 90 tablet 1   MOUNJARO 7.5 MG/0.5ML Pen INJECT 7.5 MG INTO THE SKIN ONCE A WEEK 4 mL 0   Multiple Vitamins-Minerals (MULTIVITAMIN WITH MINERALS) tablet Take 1 tablet by mouth daily.     NON FORMULARY Take 5 drops by mouth daily. Vitamin A,D,K daily     Omega 3-6-9 Fatty Acids (OMEGA-3-6-9 PO) Take 15 mLs by mouth daily. 15.9 g     omeprazole (PRILOSEC) 20 MG capsule Take 1 capsule (20 mg total) by mouth daily as needed (acid reflux). Take 20 mg by mouth daily as needed (acid reflux). 90 capsule 1   tamsulosin (FLOMAX) 0.4 MG CAPS capsule Take 1 capsule by mouth once daily 90 capsule 0   Turmeric 500 MG CAPS Take 1,000 mg by mouth daily.     rosuvastatin (CRESTOR) 10 MG tablet TAKE 1 TABLET(10 MG) BY MOUTH DAILY 90 tablet 1   No facility-administered medications prior to visit.    ROS Review of Systems   Constitutional: Negative.  Negative for chills, diaphoresis, fatigue and fever.  HENT: Negative.    Eyes: Negative.   Respiratory:  Negative for cough, chest tightness and shortness of breath.   Cardiovascular:  Negative for chest pain, palpitations and leg swelling.  Gastrointestinal:  Negative for abdominal pain and diarrhea.  Endocrine: Negative.   Genitourinary: Negative.   Musculoskeletal:  Negative for arthralgias.  Skin:  Positive for color change.  Allergic/Immunologic: Negative.   Neurological: Negative.  Negative for dizziness and weakness.  Hematological:  Negative for adenopathy. Does not bruise/bleed easily.  Psychiatric/Behavioral: Negative.      Objective:  BP 128/84 (BP Location: Left Arm, Patient Position: Sitting, Cuff Size: Large)   Pulse 78   Temp 98.2 F (36.8 C) (Oral)   Resp 16   Ht '5\' 9"'$  (1.753 m)   Wt 226 lb (102.5 kg)   SpO2 95%   BMI 33.37 kg/m   BP Readings from Last 3 Encounters:  01/25/22 128/84  12/22/21 120/68  12/15/21 118/70    Wt Readings from Last 3 Encounters:  01/25/22 226 lb (102.5 kg)  12/22/21 226 lb (102.5 kg)  12/15/21 226 lb (102.5 kg)    Physical Exam Vitals reviewed.  HENT:     Nose:  Nose normal.     Mouth/Throat:     Mouth: Mucous membranes are moist.  Eyes:     General: No scleral icterus.    Conjunctiva/sclera: Conjunctivae normal.  Cardiovascular:     Rate and Rhythm: Normal rate and regular rhythm.     Heart sounds: No murmur heard. Pulmonary:     Effort: Pulmonary effort is normal.     Breath sounds: No stridor. No wheezing, rhonchi or rales.  Abdominal:     General: Abdomen is flat.     Palpations: There is no mass.     Tenderness: There is no abdominal tenderness. There is no guarding.     Hernia: No hernia is present.  Musculoskeletal:        General: Normal range of motion.     Cervical back: Neck supple.     Right lower leg: No edema.     Left lower leg: No edema.     Comments: On the dorsum  and lateral aspect of the left foot there are 2 areas of subcutaneous induration with overlying erythema, warmth, and peeling.  There are no formed abscesses, no exudate no streaking, and no induration.  Lymphadenopathy:     Cervical: No cervical adenopathy.  Skin:    General: Skin is warm.     Findings: Erythema present.  Neurological:     Mental Status: He is alert.     Lab Results  Component Value Date   WBC 6.4 01/25/2022   HGB 12.5 (L) 01/25/2022   HCT 38.6 (L) 01/25/2022   PLT 167.0 01/25/2022   GLUCOSE 105 (H) 12/15/2021   CHOL 109 01/25/2022   TRIG 103.0 01/25/2022   HDL 48.50 01/25/2022   LDLCALC 40 01/25/2022   ALT 15 12/15/2021   AST 19 12/15/2021   NA 137 12/15/2021   K 3.9 12/15/2021   CL 99 12/15/2021   CREATININE 0.97 12/15/2021   BUN 16 12/15/2021   CO2 35 (H) 12/15/2021   TSH 1.01 01/25/2022   PSA 0.75 01/25/2022   INR 1.0 02/04/2021   HGBA1C 6.2 01/25/2022   MICROALBUR 0.8 01/25/2022    CT RENAL STONE STUDY  Result Date: 03/30/2021 CLINICAL DATA:  66 year old male with history of right-sided flank pain for the past 6 days. EXAM: CT ABDOMEN AND PELVIS WITHOUT CONTRAST TECHNIQUE: Multidetector CT imaging of the abdomen and pelvis was performed following the standard protocol without IV contrast. COMPARISON:  CT of the abdomen and pelvis 03/25/2021. FINDINGS: Lower chest: Unremarkable. Hepatobiliary: No suspicious cystic or solid hepatic lesions are confidently identified on today's noncontrast CT examination. Unenhanced appearance of the gallbladder is normal. Pancreas: No definite pancreatic mass or peripancreatic fluid collections or inflammatory changes are noted on today's noncontrast CT examination. Spleen: Unremarkable. Adrenals/Urinary Tract: There are no abnormal calcifications within the collecting system of either kidney, along the course of either ureter, or within the lumen of the urinary bladder. No hydroureteronephrosis or perinephric stranding to  suggest urinary tract obstruction at this time. The unenhanced appearance of the kidneys is unremarkable bilaterally. Unenhanced appearance of the urinary bladder is grossly unremarkable (urinary bladder is nearly completely decompressed and partially obscured by beam hardening artifact from the patient's right hip arthroplasty). Bilateral adrenal glands are normal in appearance. Stomach/Bowel: Unenhanced appearance of the stomach is normal. No pathologic dilatation of small bowel or colon. Status post right hemicolectomy. Vascular/Lymphatic: Atherosclerotic calcifications in the pelvic vasculature. No lymphadenopathy noted in the abdomen or pelvis. Reproductive: Prostate gland and seminal vesicles are  unremarkable in appearance. Other: No significant volume of ascites.  No pneumoperitoneum. Musculoskeletal: Posterior fixation hardware is noted extending from L4-S1. Status post right hip arthroplasty. There are no aggressive appearing lytic or blastic lesions noted in the visualized portions of the skeleton. IMPRESSION: 1. No acute findings are noted in the abdomen or pelvis to account for the patient's symptoms. Specifically, no urinary tract calculi no findings of urinary tract obstruction are noted at this time. 2. Atherosclerosis. 3. Additional incidental findings, as above. Electronically Signed   By: Vinnie Langton M.D.   On: 03/30/2021 11:36    Assessment & Plan:   Donald [name] was seen today for annual exam.  Diagnoses and all orders for this visit:  Primary hypertension- His blood pressure is adequately well controlled. -     TSH; Future -     Urinalysis, Routine w reflex microscopic; Future -     CBC with Differential/Platelet; Future -     CBC with Differential/Platelet -     Urinalysis, Routine w reflex microscopic -     TSH  Type 2 diabetes mellitus without complication, without long-term current use of insulin ([place])- His blood sugar is adequately well controlled. -     Microalbumin /  creatinine urine ratio; Future -     Hemoglobin A1c; Future -     Hemoglobin A1c -     Microalbumin / creatinine urine ratio -     HM Diabetes Foot Exam  Hyperlipidemia with target LDL less than 100- LDL goal achieved. Doing well on the statin  -     Lipid panel; Future -     TSH; Future -     TSH -     Lipid panel -     rosuvastatin (CRESTOR) 10 MG tablet; Take 1 tablet (10 mg total) by mouth daily.  Encounter for general adult medical examination with abnormal findings- Exam completed, labs reviewed, vaccines reviewed and updated, cancer screenings addressed, patient education was given.  Benign prostatic hyperplasia with urinary frequency- His PSA is normal. -     Urinalysis, Routine w reflex microscopic; Future -     PSA; Future -     PSA -     Urinalysis, Routine w reflex microscopic  Cellulitis of left foot- I am concerned this is staph.  Will treat with Bactrim. -     sulfamethoxazole-trimethoprim (BACTRIM DS) 800-160 MG tablet; Take 1 tablet by mouth 2 (two) times daily for 10 days.   I have changed Donald [name]. Kucher's rosuvastatin. I am also having him start on sulfamethoxazole-trimethoprim and Shingrix. Additionally, I am having him maintain his buprenorphine-naloxone, NON FORMULARY, multivitamin with minerals, Omega 3-6-9 Fatty Acids (OMEGA-3-6-9 PO), Turmeric, Cyanocobalamin (Z-61 PO), Folic Acid-Vit W9-UEA V40, Vitamin D3, fluticasone, omeprazole, losartan, tamsulosin, Mounjaro, and hydrochlorothiazide.  Meds ordered this encounter  Medications   sulfamethoxazole-trimethoprim (BACTRIM DS) 800-160 MG tablet    Sig: Take 1 tablet by mouth 2 (two) times daily for 10 days.    Dispense:  20 tablet    Refill:  0   rosuvastatin (CRESTOR) 10 MG tablet    Sig: Take 1 tablet (10 mg total) by mouth daily.    Dispense:  90 tablet    Refill:  1   Zoster Vaccine Adjuvanted [facility]) injection    Sig: Inject 0.5 mLs into the muscle once for 1 dose.    Dispense:  0.5 mL    Refill:   1     Follow-up: Return in about 1 week (  around 02/01/2022).  Scarlette Calico, MD

## 2022-01-26 LAB — URINALYSIS, ROUTINE W REFLEX MICROSCOPIC
Bilirubin Urine: NEGATIVE
Hgb urine dipstick: NEGATIVE
Ketones, ur: NEGATIVE
Leukocytes,Ua: NEGATIVE
Nitrite: NEGATIVE
RBC / HPF: NONE SEEN (ref 0–?)
Specific Gravity, Urine: 1.02 (ref 1.000–1.030)
Total Protein, Urine: NEGATIVE
Urine Glucose: NEGATIVE
Urobilinogen, UA: 0.2 (ref 0.0–1.0)
pH: 7 (ref 5.0–8.0)

## 2022-01-26 LAB — HEMOGLOBIN A1C: Hgb A1c MFr Bld: 6.2 % (ref 4.6–6.5)

## 2022-01-26 MED ORDER — SHINGRIX 50 MCG/0.5ML IM SUSR: 0.5000 mL | Freq: Once | INTRAMUSCULAR | 1 refills | Status: AC

## 2022-01-26 MED ORDER — ROSUVASTATIN CALCIUM 10 MG PO TABS: 10.0000 mg | ORAL_TABLET | Freq: Every day | ORAL | 1 refills | Status: DC

## 2022-01-27 ENCOUNTER — Encounter: Payer: Self-pay | Admitting: Internal Medicine

## 2022-01-30 ENCOUNTER — Other Ambulatory Visit: Payer: Self-pay | Admitting: Internal Medicine

## 2022-01-30 DIAGNOSIS — E1165 Type 2 diabetes mellitus with hyperglycemia: Secondary | ICD-10-CM

## 2022-02-06 ENCOUNTER — Other Ambulatory Visit: Payer: Self-pay | Admitting: Internal Medicine

## 2022-02-06 DIAGNOSIS — N401 Enlarged prostate with lower urinary tract symptoms: Secondary | ICD-10-CM

## 2022-02-06 DIAGNOSIS — N201 Calculus of ureter: Secondary | ICD-10-CM

## 2022-02-09 ENCOUNTER — Other Ambulatory Visit: Payer: Self-pay | Admitting: Internal Medicine

## 2022-02-09 DIAGNOSIS — E785 Hyperlipidemia, unspecified: Secondary | ICD-10-CM

## 2022-02-15 DIAGNOSIS — Z79899 Other long term (current) drug therapy: Secondary | ICD-10-CM | POA: Diagnosis not present

## 2022-02-15 DIAGNOSIS — G894 Chronic pain syndrome: Secondary | ICD-10-CM | POA: Diagnosis not present

## 2022-02-15 DIAGNOSIS — M961 Postlaminectomy syndrome, not elsewhere classified: Secondary | ICD-10-CM | POA: Diagnosis not present

## 2022-02-15 DIAGNOSIS — M5416 Radiculopathy, lumbar region: Secondary | ICD-10-CM | POA: Diagnosis not present

## 2022-02-22 DIAGNOSIS — M2669 Other specified disorders of temporomandibular joint: Secondary | ICD-10-CM | POA: Diagnosis not present

## 2022-02-22 DIAGNOSIS — H903 Sensorineural hearing loss, bilateral: Secondary | ICD-10-CM | POA: Diagnosis not present

## 2022-02-22 DIAGNOSIS — H6122 Impacted cerumen, left ear: Secondary | ICD-10-CM | POA: Diagnosis not present

## 2022-02-22 DIAGNOSIS — H9313 Tinnitus, bilateral: Secondary | ICD-10-CM | POA: Diagnosis not present

## 2022-03-08 NOTE — Patient Instructions (Signed)
Health Maintenance, Male Adopting a healthy lifestyle and getting preventive care are important in promoting health and wellness. Ask your health care provider about: The right schedule for you to have regular tests and exams. Things you can do on your own to prevent diseases and keep yourself healthy. What should I know about diet, weight, and exercise? Eat a healthy diet  Eat a diet that includes plenty of vegetables, fruits, low-fat dairy products, and lean protein. Do not eat a lot of foods that are high in solid fats, added sugars, or sodium. Maintain a healthy weight Body mass index (BMI) is a measurement that can be used to identify possible weight problems. It estimates body fat based on height and weight. Your health care provider can help determine your BMI and help you achieve or maintain a healthy weight. Get regular exercise Get regular exercise. This is one of the most important things you can do for your health. Most adults should: Exercise for at least 150 minutes each week. The exercise should increase your heart rate and make you sweat (moderate-intensity exercise). Do strengthening exercises at least twice a week. This is in addition to the moderate-intensity exercise. Spend less time sitting. Even light physical activity can be beneficial. Watch cholesterol and blood lipids Have your blood tested for lipids and cholesterol at 67 years of age, then have this test every 5 years. You may need to have your cholesterol levels checked more often if: Your lipid or cholesterol levels are high. You are older than 67 years of age. You are at high risk for heart disease. What should I know about cancer screening? Many types of cancers can be detected early and may often be prevented. Depending on your health history and family history, you may need to have cancer screening at various ages. This may include screening for: Colorectal cancer. Prostate cancer. Skin cancer. Lung  cancer. What should I know about heart disease, diabetes, and high blood pressure? Blood pressure and heart disease High blood pressure causes heart disease and increases the risk of stroke. This is more likely to develop in people who have high blood pressure readings or are overweight. Talk with your health care provider about your target blood pressure readings. Have your blood pressure checked: Every 3-5 years if you are 18-39 years of age. Every year if you are 40 years old or older. If you are between the ages of 65 and 75 and are a current or former smoker, ask your health care provider if you should have a one-time screening for abdominal aortic aneurysm (AAA). Diabetes Have regular diabetes screenings. This checks your fasting blood sugar level. Have the screening done: Once every three years after age 45 if you are at a normal weight and have a low risk for diabetes. More often and at a younger age if you are overweight or have a high risk for diabetes. What should I know about preventing infection? Hepatitis B If you have a higher risk for hepatitis B, you should be screened for this virus. Talk with your health care provider to find out if you are at risk for hepatitis B infection. Hepatitis C Blood testing is recommended for: Everyone born from 1945 through 1965. Anyone with known risk factors for hepatitis C. Sexually transmitted infections (STIs) You should be screened each year for STIs, including gonorrhea and chlamydia, if: You are sexually active and are younger than 67 years of age. You are older than 67 years of age and your   health care provider tells you that you are at risk for this type of infection. Your sexual activity has changed since you were last screened, and you are at increased risk for chlamydia or gonorrhea. Ask your health care provider if you are at risk. Ask your health care provider about whether you are at high risk for HIV. Your health care provider  may recommend a prescription medicine to help prevent HIV infection. If you choose to take medicine to prevent HIV, you should first get tested for HIV. You should then be tested every 3 months for as long as you are taking the medicine. Follow these instructions at home: Alcohol use Do not drink alcohol if your health care provider tells you not to drink. If you drink alcohol: Limit how much you have to 0-2 drinks a day. Know how much alcohol is in your drink. In the [place] one drink equals one 12 oz bottle of beer (355 mL), one 5 oz glass of Laterrian Hevener (148 mL), or one 1 oz glass of hard liquor (44 mL). Lifestyle Do not use any products that contain nicotine or tobacco. These products include cigarettes, chewing tobacco, and vaping devices, such as e-cigarettes. If you need help quitting, ask your health care provider. Do not use street drugs. Do not share needles. Ask your health care provider for help if you need support or information about quitting drugs. General instructions Schedule regular health, dental, and eye exams. Stay current with your vaccines. Tell your health care provider if: You often feel depressed. You have ever been abused or do not feel safe at home. Summary Adopting a healthy lifestyle and getting preventive care are important in promoting health and wellness. Follow your health care provider's instructions about healthy diet, exercising, and getting tested or screened for diseases. Follow your health care provider's instructions on monitoring your cholesterol and blood pressure. This information is not intended to replace advice given to you by your health care provider. Make sure you discuss any questions you have with your health care provider. Document Revised: 10/11/2020 Document Reviewed: 10/11/2020 [employer] Patient Education  2023 [employer]  
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
## 2022-03-09 ENCOUNTER — Ambulatory Visit (INDEPENDENT_AMBULATORY_CARE_PROVIDER_SITE_OTHER): Payer: Medicare HMO | Admitting: *Deleted

## 2022-03-09 DIAGNOSIS — Z0000 Encounter for general adult medical examination without abnormal findings: Secondary | ICD-10-CM

## 2022-03-16 ENCOUNTER — Encounter: Payer: Self-pay | Admitting: Internal Medicine

## 2022-03-17 ENCOUNTER — Other Ambulatory Visit: Payer: Self-pay | Admitting: Internal Medicine

## 2022-03-23 ENCOUNTER — Telehealth: Payer: Self-pay | Admitting: Internal Medicine

## 2022-03-23 NOTE — Telephone Encounter (Signed)
LVM for pt to rtn my call to schedule AWV-I with [facility] call back # [phone] 

## 2022-03-27 ENCOUNTER — Ambulatory Visit (INDEPENDENT_AMBULATORY_CARE_PROVIDER_SITE_OTHER): Payer: Medicare HMO | Admitting: Internal Medicine

## 2022-03-27 ENCOUNTER — Encounter: Payer: Self-pay | Admitting: Internal Medicine

## 2022-03-27 VITALS — BP 124/76 | HR 74 | Ht 69.0 in | Wt 226.0 lb

## 2022-03-27 DIAGNOSIS — E119 Type 2 diabetes mellitus without complications: Secondary | ICD-10-CM

## 2022-03-27 DIAGNOSIS — I10 Essential (primary) hypertension: Secondary | ICD-10-CM | POA: Diagnosis not present

## 2022-03-27 DIAGNOSIS — M19072 Primary osteoarthritis, left ankle and foot: Secondary | ICD-10-CM | POA: Diagnosis not present

## 2022-03-27 DIAGNOSIS — D539 Nutritional anemia, unspecified: Secondary | ICD-10-CM

## 2022-03-27 LAB — CBC WITH DIFFERENTIAL/PLATELET
Basophils Absolute: 0 10*3/uL (ref 0.0–0.1)
Basophils Relative: 0.2 % (ref 0.0–3.0)
Eosinophils Absolute: 0.1 10*3/uL (ref 0.0–0.7)
Eosinophils Relative: 0.7 % (ref 0.0–5.0)
HCT: 40.6 % (ref 39.0–52.0)
Hemoglobin: 13.3 g/dL (ref 13.0–17.0)
Lymphocytes Relative: 37.8 % (ref 12.0–46.0)
Lymphs Abs: 2.6 10*3/uL (ref 0.7–4.0)
MCHC: 32.7 g/dL (ref 30.0–36.0)
MCV: 89 fl (ref 78.0–100.0)
Monocytes Absolute: 0.5 10*3/uL (ref 0.1–1.0)
Monocytes Relative: 7.5 % (ref 3.0–12.0)
Neutro Abs: 3.8 10*3/uL (ref 1.4–7.7)
Neutrophils Relative %: 53.8 % (ref 43.0–77.0)
Platelets: 179 10*3/uL (ref 150.0–400.0)
RBC: 4.56 Mil/uL (ref 4.22–5.81)
RDW: 13.4 % (ref 11.5–15.5)
WBC: 7 10*3/uL (ref 4.0–10.5)

## 2022-03-27 LAB — FOLATE: Folate: >23.8 ng/mL (ref >5.9)

## 2022-03-27 LAB — IBC + FERRITIN
Ferritin: 43.3 ng/mL (ref 22.0–322.0)
Iron: 108 ug/dL (ref 42–165)
Saturation Ratios: 33.3 % (ref 20.0–50.0)
TIBC: 324.8 ug/dL (ref 250.0–450.0)
Transferrin: 232 mg/dL (ref 212.0–360.0)

## 2022-03-27 LAB — VITAMIN B12: Vitamin B-12: >1500 pg/mL — ABNORMAL HIGH (ref 211–911)

## 2022-03-27 NOTE — Patient Instructions (Signed)

## 2022-03-27 NOTE — Progress Notes (Signed)
Subjective:  Patient ID: Donald [name], male    DOB: Aug 17, 1955  Age: 66 y.o. MRN: [id number]  CC: Anemia   HPI Donald [name] presents for f/up -  He is active and denies chest pain, shortness of breath, diaphoresis, or edema.  He was not willing to get vaccinated against influenza, pneumonia, or shingles.  Outpatient Medications Prior to Visit  Medication Sig Dispense Refill   buprenorphine-naloxone (SUBOXONE) 8-2 mg SUBL SL tablet Place 1 tablet under the tongue daily.     Cholecalciferol (VITAMIN D3) 125 MCG (5000 UT) CAPS Take 5,000 Units by mouth in the morning and at bedtime.     Cyanocobalamin (B-12 PO) Take 4 drops by mouth daily.     fluticasone (FLONASE) 50 MCG/ACT nasal spray Place 2 sprays into both nostrils daily as needed for allergies or rhinitis.     Folic Acid-Vit B1-DVV O16 0.5-5-0.2 MG TABS Take 1 tablet by mouth daily. B6 Complex     hydrochlorothiazide (HYDRODIURIL) 25 MG tablet TAKE 1 TABLET(25 MG) BY MOUTH EVERY MORNING 90 tablet 1   losartan (COZAAR) 25 MG tablet TAKE 1 TABLET(25 MG) BY MOUTH DAILY AT 10 PM 90 tablet 1   MOUNJARO 7.5 MG/0.5ML Pen INJECT 7.5 MG INTO THE SKIN ONCE A WEEK 12 mL 0   Multiple Vitamins-Minerals (MULTIVITAMIN WITH MINERALS) tablet Take 1 tablet by mouth daily.     NON FORMULARY Take 5 drops by mouth daily. Vitamin A,D,K daily     Omega 3-6-9 Fatty Acids (OMEGA-3-6-9 PO) Take 15 mLs by mouth daily. 15.9 g     omeprazole (PRILOSEC) 20 MG capsule TAKE 1 CAPSULE BY MOUTH ONCE DAILY AS NEEDED (ACID  REFLUX) 90 capsule 0   rosuvastatin (CRESTOR) 10 MG tablet TAKE 1 TABLET(10 MG) BY MOUTH DAILY 90 tablet 1   tamsulosin (FLOMAX) 0.4 MG CAPS capsule Take 1 capsule by mouth once daily 90 capsule 0   Turmeric 500 MG CAPS Take 1,000 mg by mouth daily.     No facility-administered medications prior to visit.    ROS Review of Systems  Constitutional:  Negative for diaphoresis and fatigue.  HENT: Negative.    Eyes: Negative.   Respiratory:   Negative for cough, chest tightness, shortness of breath and wheezing.   Cardiovascular:  Negative for chest pain, palpitations and leg swelling.  Gastrointestinal:  Negative for abdominal pain, blood in stool, constipation, diarrhea, nausea and vomiting.  Endocrine: Negative.   Genitourinary:  Negative for difficulty urinating.  Musculoskeletal:  Positive for arthralgias. Negative for myalgias.  Skin: Negative.   Neurological:  Negative for dizziness and weakness.  Hematological:  Negative for adenopathy. Does not bruise/bleed easily.  Psychiatric/Behavioral:  Negative for self-injury.     Objective:  BP 124/76   Pulse 74   Ht '5\' 9"'$  (1.753 m)   Wt 226 lb (102.5 kg)   SpO2 97%   BMI 33.37 kg/m   BP Readings from Last 3 Encounters:  03/27/22 124/76  01/25/22 128/84  12/22/21 120/68    Wt Readings from Last 3 Encounters:  03/27/22 226 lb (102.5 kg)  01/25/22 226 lb (102.5 kg)  12/22/21 226 lb (102.5 kg)    Physical Exam Vitals reviewed.  HENT:     Nose: Nose normal.     Mouth/Throat:     Mouth: Mucous membranes are moist.  Eyes:     General: No scleral icterus.    Conjunctiva/sclera: Conjunctivae normal.  Cardiovascular:     Rate and Rhythm: Normal rate  and regular rhythm.     Heart sounds: No murmur heard. Pulmonary:     Effort: Pulmonary effort is normal.     Breath sounds: No stridor. No wheezing, rhonchi or rales.  Abdominal:     General: Abdomen is flat.     Palpations: There is no mass.     Tenderness: There is no abdominal tenderness. There is no guarding or rebound.     Hernia: No hernia is present.  Musculoskeletal:        General: Normal range of motion.     Cervical back: Neck supple.     Right lower leg: No edema.     Left lower leg: No edema.  Lymphadenopathy:     Cervical: No cervical adenopathy.  Skin:    General: Skin is warm and dry.  Neurological:     General: No focal deficit present.     Mental Status: He is alert.  Psychiatric:         Mood and Affect: Mood normal.     Lab Results  Component Value Date   WBC 7.0 03/27/2022   HGB 13.3 03/27/2022   HCT 40.6 03/27/2022   PLT 179.0 03/27/2022   GLUCOSE 105 (H) 12/15/2021   CHOL 109 01/25/2022   TRIG 103.0 01/25/2022   HDL 48.50 01/25/2022   LDLCALC 40 01/25/2022   ALT 15 12/15/2021   AST 19 12/15/2021   NA 137 12/15/2021   K 3.9 12/15/2021   CL 99 12/15/2021   CREATININE 0.97 12/15/2021   BUN 16 12/15/2021   CO2 35 (H) 12/15/2021   TSH 1.01 01/25/2022   PSA 0.75 01/25/2022   INR 1.0 02/04/2021   HGBA1C 6.2 01/25/2022   MICROALBUR 0.8 01/25/2022    CT RENAL STONE STUDY  Result Date: 03/30/2021 CLINICAL DATA:  66 year old male with history of right-sided flank pain for the past 6 days. EXAM: CT ABDOMEN AND PELVIS WITHOUT CONTRAST TECHNIQUE: Multidetector CT imaging of the abdomen and pelvis was performed following the standard protocol without IV contrast. COMPARISON:  CT of the abdomen and pelvis 03/25/2021. FINDINGS: Lower chest: Unremarkable. Hepatobiliary: No suspicious cystic or solid hepatic lesions are confidently identified on today's noncontrast CT examination. Unenhanced appearance of the gallbladder is normal. Pancreas: No definite pancreatic mass or peripancreatic fluid collections or inflammatory changes are noted on today's noncontrast CT examination. Spleen: Unremarkable. Adrenals/Urinary Tract: There are no abnormal calcifications within the collecting system of either kidney, along the course of either ureter, or within the lumen of the urinary bladder. No hydroureteronephrosis or perinephric stranding to suggest urinary tract obstruction at this time. The unenhanced appearance of the kidneys is unremarkable bilaterally. Unenhanced appearance of the urinary bladder is grossly unremarkable (urinary bladder is nearly completely decompressed and partially obscured by beam hardening artifact from the patient's right hip arthroplasty). Bilateral adrenal  glands are normal in appearance. Stomach/Bowel: Unenhanced appearance of the stomach is normal. No pathologic dilatation of small bowel or colon. Status post right hemicolectomy. Vascular/Lymphatic: Atherosclerotic calcifications in the pelvic vasculature. No lymphadenopathy noted in the abdomen or pelvis. Reproductive: Prostate gland and seminal vesicles are unremarkable in appearance. Other: No significant volume of ascites.  No pneumoperitoneum. Musculoskeletal: Posterior fixation hardware is noted extending from L4-S1. Status post right hip arthroplasty. There are no aggressive appearing lytic or blastic lesions noted in the visualized portions of the skeleton. IMPRESSION: 1. No acute findings are noted in the abdomen or pelvis to account for the patient's symptoms. Specifically, no urinary tract calculi  no findings of urinary tract obstruction are noted at this time. 2. Atherosclerosis. 3. Additional incidental findings, as above. Electronically Signed   By: Vinnie Langton M.D.   On: 03/30/2021 11:36    Assessment & Plan:   Avyaan was seen today for anemia.  Diagnoses and all orders for this visit:  Deficiency anemia- His H&H have improved.  I will monitor for vitamin deficiencies. -     IBC + Ferritin; Future -     CBC with Differential/Platelet; Future -     Reticulocytes; Future -     Vitamin B1; Future -     Zinc; Future -     Folate; Future -     Vitamin B12; Future -     Vitamin B12 -     Folate -     Zinc -     Vitamin B1 -     Reticulocytes -     CBC with Differential/Platelet -     IBC + Ferritin  Primary osteoarthritis of left foot -     Ambulatory referral to Podiatry  Primary hypertension- His blood pressure is well controlled.  Type 2 diabetes mellitus without complication, without long-term current use of insulin ([place])- His blood sugar is well controlled.   I am having Lourena Simmonds maintain his buprenorphine-naloxone, NON FORMULARY, multivitamin with minerals,  Omega 3-6-9 Fatty Acids (OMEGA-3-6-9 PO), Turmeric, Cyanocobalamin (L-84 PO), Folic Acid-Vit T3-MIW O03, Vitamin D3, fluticasone, losartan, hydrochlorothiazide, Mounjaro, tamsulosin, omeprazole, and rosuvastatin.  No orders of the defined types were placed in this encounter.    Follow-up: Return in about 3 months (around 06/27/2022).  Scarlette Calico, MD

## 2022-03-30 LAB — VITAMIN B1: Vitamin B1 (Thiamine): 57 nmol/L — ABNORMAL HIGH (ref 8–30)

## 2022-03-30 LAB — RETICULOCYTES
ABS Retic: 54360 cells/uL (ref 25000–90000)
Retic Ct Pct: 1.2 %

## 2022-03-30 LAB — ZINC: Zinc: 67 ug/dL (ref 60–130)
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
## 2022-04-07 ENCOUNTER — Other Ambulatory Visit: Payer: Self-pay | Admitting: Internal Medicine

## 2022-04-07 ENCOUNTER — Ambulatory Visit: Payer: Medicare HMO | Admitting: Podiatry

## 2022-04-07 ENCOUNTER — Ambulatory Visit (INDEPENDENT_AMBULATORY_CARE_PROVIDER_SITE_OTHER): Payer: Medicare HMO

## 2022-04-07 DIAGNOSIS — M7989 Other specified soft tissue disorders: Secondary | ICD-10-CM

## 2022-04-07 DIAGNOSIS — M79672 Pain in left foot: Secondary | ICD-10-CM

## 2022-04-07 DIAGNOSIS — E1165 Type 2 diabetes mellitus with hyperglycemia: Secondary | ICD-10-CM

## 2022-04-07 MED ORDER — METHYLPREDNISOLONE 4 MG PO TBPK: ORAL_TABLET | ORAL | 0 refills | Status: DC

## 2022-04-07 NOTE — Progress Notes (Signed)
Subjective:   Patient ID: Donald [name], male   DOB: 66 y.o.   MRN: [id number]   HPI Chief Complaint  Patient presents with   Foot Pain    Diabetic A1c-6.2 BG- not taking, Knot in the top of the left foot, and also in the heel of the foot,  patient does have some discoloration,  patient denies any pain, started month ago, X-rays done today,     There may be 2 knots on the top of the foot, no injruies he can recall. PCP orered antibiotics but he did not take due to the size of the pill.    ROS      Objective:  Physical Exam  ***     Assessment:  ***     Plan:  ***   -medro dose pack- watch blood work

## 2022-04-08 LAB — CBC WITH DIFFERENTIAL/PLATELET
Absolute Monocytes: 469 cells/uL (ref 200–950)
Basophils Absolute: 21 cells/uL (ref 0–200)
Basophils Relative: 0.3 %
Eosinophils Absolute: 28 cells/uL (ref 15–500)
Eosinophils Relative: 0.4 %
HCT: 40.8 % (ref 38.5–50.0)
Hemoglobin: 13.5 g/dL (ref 13.2–17.1)
Lymphs Abs: 1898 cells/uL (ref 850–3900)
MCH: 29 pg (ref 27.0–33.0)
MCHC: 33.1 g/dL (ref 32.0–36.0)
MCV: 87.7 fL (ref 80.0–100.0)
MPV: 11.6 fL (ref 7.5–12.5)
Monocytes Relative: 6.8 %
Neutro Abs: 4485 cells/uL (ref 1500–7800)
Neutrophils Relative %: 65 %
Platelets: 193 10*3/uL (ref 140–400)
RBC: 4.65 10*6/uL (ref 4.20–5.80)
RDW: 12.6 % (ref 11.0–15.0)
Total Lymphocyte: 27.5 %
WBC: 6.9 10*3/uL (ref 3.8–10.8)

## 2022-04-08 LAB — URIC ACID: Uric Acid, Serum: 4.1 mg/dL (ref 4.0–8.0)

## 2022-04-08 LAB — SEDIMENTATION RATE: Sed Rate: 33 mm/h — ABNORMAL HIGH (ref 0–20)

## 2022-04-08 LAB — C-REACTIVE PROTEIN: CRP: 8 mg/L — ABNORMAL HIGH (ref <8.0)

## 2022-04-14 ENCOUNTER — Telehealth: Payer: Self-pay | Admitting: Podiatry

## 2022-04-14 NOTE — Telephone Encounter (Signed)
Attempted to contact the patient but unsuccessful. Left vm for the patient to call the office back.

## 2022-04-14 NOTE — Telephone Encounter (Signed)
I sent the patient a mychart message previously (and also tried to call the patient). It looks like he has not read his mychart. Can someone please try to contact him again? Below is the message I sent. Thanks! ---  Sorry I missed you, but I left a voicemail to go over the results. It does show your inflammatory markers were somewhat elevated but your white blood cell count that measures infection is normal. Your uric acid level is also normal. How are you doing with the medication? If we are not seeing any improvement, I would like to get a MRI. Hope you are doing well.

## 2022-04-20 ENCOUNTER — Encounter: Payer: Self-pay | Admitting: Rheumatology

## 2022-04-20 ENCOUNTER — Ambulatory Visit: Payer: Medicare HMO | Attending: Rheumatology | Admitting: Rheumatology

## 2022-04-20 VITALS — BP 113/74 | HR 71 | Resp 17 | Ht 69.0 in | Wt 222.4 lb

## 2022-04-20 DIAGNOSIS — M19011 Primary osteoarthritis, right shoulder: Secondary | ICD-10-CM | POA: Diagnosis not present

## 2022-04-20 DIAGNOSIS — M1612 Unilateral primary osteoarthritis, left hip: Secondary | ICD-10-CM

## 2022-04-20 DIAGNOSIS — M170 Bilateral primary osteoarthritis of knee: Secondary | ICD-10-CM | POA: Diagnosis not present

## 2022-04-20 DIAGNOSIS — R700 Elevated erythrocyte sedimentation rate: Secondary | ICD-10-CM

## 2022-04-20 DIAGNOSIS — F4310 Post-traumatic stress disorder, unspecified: Secondary | ICD-10-CM

## 2022-04-20 DIAGNOSIS — M19042 Primary osteoarthritis, left hand: Secondary | ICD-10-CM

## 2022-04-20 DIAGNOSIS — M79672 Pain in left foot: Secondary | ICD-10-CM | POA: Diagnosis not present

## 2022-04-20 DIAGNOSIS — G4731 Primary central sleep apnea: Secondary | ICD-10-CM

## 2022-04-20 DIAGNOSIS — M5030 Other cervical disc degeneration, unspecified cervical region: Secondary | ICD-10-CM

## 2022-04-20 DIAGNOSIS — E1165 Type 2 diabetes mellitus with hyperglycemia: Secondary | ICD-10-CM

## 2022-04-20 DIAGNOSIS — B182 Chronic viral hepatitis C: Secondary | ICD-10-CM

## 2022-04-20 DIAGNOSIS — M19041 Primary osteoarthritis, right hand: Secondary | ICD-10-CM

## 2022-04-20 DIAGNOSIS — N401 Enlarged prostate with lower urinary tract symptoms: Secondary | ICD-10-CM

## 2022-04-20 DIAGNOSIS — M5136 Other intervertebral disc degeneration, lumbar region: Secondary | ICD-10-CM | POA: Diagnosis not present

## 2022-04-20 DIAGNOSIS — R350 Frequency of micturition: Secondary | ICD-10-CM

## 2022-04-20 DIAGNOSIS — Z96641 Presence of right artificial hip joint: Secondary | ICD-10-CM

## 2022-04-20 DIAGNOSIS — G894 Chronic pain syndrome: Secondary | ICD-10-CM | POA: Diagnosis not present

## 2022-04-20 DIAGNOSIS — K219 Gastro-esophageal reflux disease without esophagitis: Secondary | ICD-10-CM

## 2022-04-20 DIAGNOSIS — I119 Hypertensive heart disease without heart failure: Secondary | ICD-10-CM

## 2022-04-20 DIAGNOSIS — E785 Hyperlipidemia, unspecified: Secondary | ICD-10-CM

## 2022-04-20 DIAGNOSIS — I10 Essential (primary) hypertension: Secondary | ICD-10-CM

## 2022-04-20 DIAGNOSIS — K7400 Hepatic fibrosis, unspecified: Secondary | ICD-10-CM

## 2022-05-01 ENCOUNTER — Other Ambulatory Visit: Payer: Self-pay | Admitting: Podiatry

## 2022-05-01 DIAGNOSIS — M7989 Other specified soft tissue disorders: Secondary | ICD-10-CM

## 2022-05-01 DIAGNOSIS — M79672 Pain in left foot: Secondary | ICD-10-CM

## 2022-05-04 ENCOUNTER — Other Ambulatory Visit: Payer: Self-pay | Admitting: Cardiology

## 2022-05-04 ENCOUNTER — Other Ambulatory Visit: Payer: Self-pay | Admitting: Internal Medicine

## 2022-05-04 DIAGNOSIS — N401 Enlarged prostate with lower urinary tract symptoms: Secondary | ICD-10-CM

## 2022-05-04 DIAGNOSIS — N201 Calculus of ureter: Secondary | ICD-10-CM

## 2022-05-04 DIAGNOSIS — I10 Essential (primary) hypertension: Secondary | ICD-10-CM

## 2022-05-12 ENCOUNTER — Encounter: Payer: Self-pay | Admitting: Internal Medicine

## 2022-05-12 MED ORDER — LOSARTAN POTASSIUM 25 MG PO TABS: 25.0000 mg | ORAL_TABLET | Freq: Every day | ORAL | 3 refills | Status: DC

## 2022-05-17 DIAGNOSIS — M961 Postlaminectomy syndrome, not elsewhere classified: Secondary | ICD-10-CM | POA: Diagnosis not present

## 2022-05-17 DIAGNOSIS — M159 Polyosteoarthritis, unspecified: Secondary | ICD-10-CM | POA: Diagnosis not present

## 2022-05-17 DIAGNOSIS — G894 Chronic pain syndrome: Secondary | ICD-10-CM | POA: Diagnosis not present

## 2022-05-25 DIAGNOSIS — Z0100 Encounter for examination of eyes and vision without abnormal findings: Secondary | ICD-10-CM | POA: Diagnosis not present

## 2022-05-25 DIAGNOSIS — H524 Presbyopia: Secondary | ICD-10-CM | POA: Diagnosis not present

## 2022-05-25 DIAGNOSIS — H0011 Chalazion right upper eyelid: Secondary | ICD-10-CM | POA: Diagnosis not present

## 2022-06-02 ENCOUNTER — Telehealth: Payer: Self-pay

## 2022-06-02 NOTE — Telephone Encounter (Signed)
Key: [id number]

## 2022-06-08 NOTE — Telephone Encounter (Signed)
Per CoverMyMeds: ? ?PA was denied.  ?

## 2022-06-28 ENCOUNTER — Encounter: Payer: Self-pay | Admitting: Internal Medicine

## 2022-06-28 ENCOUNTER — Ambulatory Visit (INDEPENDENT_AMBULATORY_CARE_PROVIDER_SITE_OTHER): Payer: Medicare HMO | Admitting: Internal Medicine

## 2022-06-28 VITALS — BP 122/74 | HR 76 | Temp 98.2°F | Resp 16 | Ht 69.0 in | Wt 229.0 lb

## 2022-06-28 DIAGNOSIS — K7400 Hepatic fibrosis, unspecified: Secondary | ICD-10-CM | POA: Diagnosis not present

## 2022-06-28 DIAGNOSIS — I10 Essential (primary) hypertension: Secondary | ICD-10-CM | POA: Diagnosis not present

## 2022-06-28 DIAGNOSIS — E119 Type 2 diabetes mellitus without complications: Secondary | ICD-10-CM | POA: Diagnosis not present

## 2022-06-28 DIAGNOSIS — R413 Other amnesia: Secondary | ICD-10-CM | POA: Diagnosis not present

## 2022-06-28 DIAGNOSIS — E1165 Type 2 diabetes mellitus with hyperglycemia: Secondary | ICD-10-CM

## 2022-06-28 LAB — BASIC METABOLIC PANEL
BUN: 19 mg/dL (ref 6–23)
CO2: 37 mEq/L — ABNORMAL HIGH (ref 19–32)
Calcium: 9.6 mg/dL (ref 8.4–10.5)
Chloride: 99 mEq/L (ref 96–112)
Creatinine, Ser: 0.92 mg/dL (ref 0.40–1.50)
GFR: 85.9 mL/min (ref >60.00)
Glucose, Bld: 90 mg/dL (ref 70–99)
Potassium: 3.9 mEq/L (ref 3.5–5.1)
Sodium: 141 mEq/L (ref 135–145)

## 2022-06-28 LAB — HEMOGLOBIN A1C: Hgb A1c MFr Bld: 5.9 % (ref 4.6–6.5)

## 2022-06-28 LAB — HEPATIC FUNCTION PANEL
ALT: 20 U/L (ref 0–53)
AST: 20 U/L (ref 0–37)
Albumin: 4.4 g/dL (ref 3.5–5.2)
Alkaline Phosphatase: 40 U/L (ref 39–117)
Bilirubin, Direct: 0.1 mg/dL (ref 0.0–0.3)
Total Bilirubin: 0.4 mg/dL (ref 0.2–1.2)
Total Protein: 7.8 g/dL (ref 6.0–8.3)

## 2022-06-28 LAB — PROTIME-INR
INR: 1 ratio (ref 0.8–1.0)
Prothrombin Time: 11.3 s (ref 9.6–13.1)

## 2022-06-28 MED ORDER — MOUNJARO 7.5 MG/0.5ML ~~LOC~~ SOAJ: 7.5000 mg | SUBCUTANEOUS | 1 refills | Status: DC

## 2022-06-28 NOTE — Patient Instructions (Signed)

## 2022-06-28 NOTE — Progress Notes (Signed)
Subjective:  Patient ID: Donald [name], male    DOB: May 21, 1956  Age: 67 y.o. MRN: [id number]  CC: Hypertension and Diabetes   HPI Donald [name] presents for f/up - He complains of a 2-year history of declining memory.  He would like to see a neurologist.  He walks 3 miles every other day and his endurance is good.  He denies chest pain, shortness of breath, diaphoresis, or edema.  Outpatient Medications Prior to Visit  Medication Sig Dispense Refill   buprenorphine-naloxone (SUBOXONE) 8-2 mg SUBL SL tablet Place 1 tablet under the tongue daily.     Cholecalciferol (VITAMIN D3) 125 MCG (5000 UT) CAPS Take 5,000 Units by mouth in the morning and at bedtime.     Cyanocobalamin (B-12 PO) Take 4 drops by mouth daily.     fluticasone (FLONASE) 50 MCG/ACT nasal spray Place 2 sprays into both nostrils daily as needed for allergies or rhinitis.     Folic Acid-Vit C4-UGQ B16 0.5-5-0.2 MG TABS Take 1 tablet by mouth daily. B6 Complex     hydrochlorothiazide (HYDRODIURIL) 25 MG tablet TAKE 1 TABLET BY MOUTH ONCE DAILY IN THE MORNING 90 tablet 0   losartan (COZAAR) 25 MG tablet Take 1 tablet (25 mg total) by mouth daily. 90 tablet 3   Multiple Vitamins-Minerals (MULTIVITAMIN WITH MINERALS) tablet Take 1 tablet by mouth daily.     NON FORMULARY Take 5 drops by mouth daily. Vitamin A,D,K daily     Omega 3-6-9 Fatty Acids (OMEGA-3-6-9 PO) Take 15 mLs by mouth daily. 15.9 g     omeprazole (PRILOSEC) 20 MG capsule TAKE 1 CAPSULE BY MOUTH ONCE DAILY AS NEEDED FOR  ACID  REFLUX 90 capsule 0   rosuvastatin (CRESTOR) 10 MG tablet TAKE 1 TABLET(10 MG) BY MOUTH DAILY 90 tablet 1   tamsulosin (FLOMAX) 0.4 MG CAPS capsule Take 1 capsule by mouth once daily 90 capsule 0   Turmeric 500 MG CAPS Take 1,000 mg by mouth daily.     losartan (COZAAR) 25 MG tablet TAKE 1 TABLET(25 MG) BY MOUTH DAILY AT 10 PM 90 tablet 1   methylPREDNISolone (MEDROL DOSEPAK) 4 MG TBPK tablet Take as directed 21 tablet 0   MOUNJARO 7.5  MG/0.5ML Pen INJECT 7.5 MG INTO THE SKIN ONCE A WEEK 12 mL 0   No facility-administered medications prior to visit.    ROS Review of Systems  Constitutional: Negative.  Negative for chills, diaphoresis, fatigue and fever.  HENT: Negative.    Eyes: Negative.   Respiratory:  Negative for cough, chest tightness, shortness of breath and wheezing.   Cardiovascular:  Negative for chest pain, palpitations and leg swelling.  Gastrointestinal:  Negative for abdominal pain, diarrhea, nausea and vomiting.  Endocrine: Negative.   Genitourinary: Negative.  Negative for difficulty urinating and dysuria.  Musculoskeletal: Negative.   Skin: Negative.   Neurological: Negative.  Negative for dizziness and weakness.  Hematological:  Negative for adenopathy. Does not bruise/bleed easily.  Psychiatric/Behavioral:  Positive for confusion. Negative for agitation, decreased concentration, dysphoric mood, hallucinations, self-injury, sleep disturbance and suicidal ideas. The patient is not nervous/anxious and is not hyperactive.     Objective:  BP 122/74 (BP Location: Left Arm, Patient Position: Sitting, Cuff Size: Large)   Pulse 76   Temp 98.2 F (36.8 C) (Oral)   Resp 16   Ht '5\' 9"'$  (1.753 m)   Wt 229 lb (103.9 kg)   SpO2 96%   BMI 33.82 kg/m   BP Readings  from Last 3 Encounters:  06/28/22 122/74  04/20/22 113/74  03/27/22 124/76    Wt Readings from Last 3 Encounters:  06/28/22 229 lb (103.9 kg)  04/20/22 222 lb 6.4 oz (100.9 kg)  03/27/22 226 lb (102.5 kg)    Physical Exam Vitals reviewed.  HENT:     Nose: Nose normal.     Mouth/Throat:     Mouth: Mucous membranes are moist.  Eyes:     General: No scleral icterus.    Conjunctiva/sclera: Conjunctivae normal.  Cardiovascular:     Rate and Rhythm: Normal rate.     Heart sounds: No murmur heard.    No gallop.  Pulmonary:     Effort: Pulmonary effort is normal.     Breath sounds: No stridor. No wheezing, rhonchi or rales.   Abdominal:     General: Abdomen is flat.     Palpations: There is no mass.     Tenderness: There is no abdominal tenderness. There is no guarding.     Hernia: No hernia is present.  Musculoskeletal:        General: Normal range of motion.     Cervical back: Neck supple.     Right lower leg: No edema.     Left lower leg: No edema.  Lymphadenopathy:     Cervical: No cervical adenopathy.  Skin:    General: Skin is warm and dry.  Neurological:     General: No focal deficit present.     Mental Status: He is alert.  Psychiatric:        Mood and Affect: Mood normal.        Behavior: Behavior normal.     Lab Results  Component Value Date   WBC 6.9 04/07/2022   HGB 13.5 04/07/2022   HCT 40.8 04/07/2022   PLT 193 04/07/2022   GLUCOSE 90 06/28/2022   CHOL 109 01/25/2022   TRIG 103.0 01/25/2022   HDL 48.50 01/25/2022   LDLCALC 40 01/25/2022   ALT 20 06/28/2022   AST 20 06/28/2022   NA 141 06/28/2022   K 3.9 06/28/2022   CL 99 06/28/2022   CREATININE 0.92 06/28/2022   BUN 19 06/28/2022   CO2 37 (H) 06/28/2022   TSH 1.01 01/25/2022   PSA 0.75 01/25/2022   INR 1.0 06/28/2022   HGBA1C 5.9 06/28/2022   MICROALBUR 0.8 01/25/2022    CT RENAL STONE STUDY  Result Date: 03/30/2021 CLINICAL DATA:  67 year old male with history of right-sided flank pain for the past 6 days. EXAM: CT ABDOMEN AND PELVIS WITHOUT CONTRAST TECHNIQUE: Multidetector CT imaging of the abdomen and pelvis was performed following the standard protocol without IV contrast. COMPARISON:  CT of the abdomen and pelvis 03/25/2021. FINDINGS: Lower chest: Unremarkable. Hepatobiliary: No suspicious cystic or solid hepatic lesions are confidently identified on today's noncontrast CT examination. Unenhanced appearance of the gallbladder is normal. Pancreas: No definite pancreatic mass or peripancreatic fluid collections or inflammatory changes are noted on today's noncontrast CT examination. Spleen: Unremarkable.  Adrenals/Urinary Tract: There are no abnormal calcifications within the collecting system of either kidney, along the course of either ureter, or within the lumen of the urinary bladder. No hydroureteronephrosis or perinephric stranding to suggest urinary tract obstruction at this time. The unenhanced appearance of the kidneys is unremarkable bilaterally. Unenhanced appearance of the urinary bladder is grossly unremarkable (urinary bladder is nearly completely decompressed and partially obscured by beam hardening artifact from the patient's right hip arthroplasty). Bilateral adrenal glands are normal in  appearance. Stomach/Bowel: Unenhanced appearance of the stomach is normal. No pathologic dilatation of small bowel or colon. Status post right hemicolectomy. Vascular/Lymphatic: Atherosclerotic calcifications in the pelvic vasculature. No lymphadenopathy noted in the abdomen or pelvis. Reproductive: Prostate gland and seminal vesicles are unremarkable in appearance. Other: No significant volume of ascites.  No pneumoperitoneum. Musculoskeletal: Posterior fixation hardware is noted extending from L4-S1. Status post right hip arthroplasty. There are no aggressive appearing lytic or blastic lesions noted in the visualized portions of the skeleton. IMPRESSION: 1. No acute findings are noted in the abdomen or pelvis to account for the patient's symptoms. Specifically, no urinary tract calculi no findings of urinary tract obstruction are noted at this time. 2. Atherosclerosis. 3. Additional incidental findings, as above. Electronically Signed   By: Vinnie Langton M.D.   On: 03/30/2021 11:36    Assessment & Plan:   Imad was seen today for hypertension and diabetes.  Diagnoses and all orders for this visit:  Primary hypertension- His BP is well controlled. -     Basic metabolic panel; Future -     Basic metabolic panel  Liver fibrosis- MELD score is reassuring at 6. -     Hepatic function panel; Future -      Protime-INR; Future -     Protime-INR -     Hepatic function panel  Type 2 diabetes mellitus without complication, without long-term current use of insulin ([place])- His blood sugar is well controlled. -     Basic metabolic panel; Future -     Hemoglobin A1c; Future -     Hemoglobin A1c -     Basic metabolic panel -     Discontinue: tirzepatide (MOUNJARO) 7.5 MG/0.5ML Pen; Inject 7.5 mg into the skin once a week.  Memory loss or impairment -     Ambulatory referral to Neurology   I have discontinued Davene Costain. Leiva's methylPREDNISolone and Mounjaro. I am also having him maintain his buprenorphine-naloxone, NON FORMULARY, multivitamin with minerals, Omega 3-6-9 Fatty Acids (OMEGA-3-6-9 PO), Turmeric, Cyanocobalamin (L-46 PO), Folic Acid-Vit T0-PTW S56, Vitamin D3, fluticasone, rosuvastatin, tamsulosin, omeprazole, hydrochlorothiazide, and losartan.  Meds ordered this encounter  Medications   DISCONTD: tirzepatide (MOUNJARO) 7.5 MG/0.5ML Pen    Sig: Inject 7.5 mg into the skin once a week.    Dispense:  6 mL    Refill:  1     Follow-up: Return in about 6 months (around 12/27/2022).  Scarlette Calico, MD

## 2022-06-30 ENCOUNTER — Other Ambulatory Visit: Payer: Self-pay | Admitting: Internal Medicine

## 2022-06-30 DIAGNOSIS — E119 Type 2 diabetes mellitus without complications: Secondary | ICD-10-CM

## 2022-07-03 ENCOUNTER — Ambulatory Visit: Payer: Medicare HMO | Admitting: Cardiology

## 2022-07-24 ENCOUNTER — Other Ambulatory Visit: Payer: Self-pay | Admitting: Internal Medicine

## 2022-07-24 DIAGNOSIS — E785 Hyperlipidemia, unspecified: Secondary | ICD-10-CM

## 2022-08-02 ENCOUNTER — Other Ambulatory Visit: Payer: Self-pay | Admitting: Internal Medicine

## 2022-08-02 ENCOUNTER — Other Ambulatory Visit: Payer: Self-pay | Admitting: Cardiology

## 2022-08-02 DIAGNOSIS — I10 Essential (primary) hypertension: Secondary | ICD-10-CM

## 2022-08-02 DIAGNOSIS — N201 Calculus of ureter: Secondary | ICD-10-CM

## 2022-08-02 DIAGNOSIS — N401 Enlarged prostate with lower urinary tract symptoms: Secondary | ICD-10-CM

## 2022-08-11 ENCOUNTER — Ambulatory Visit (INDEPENDENT_AMBULATORY_CARE_PROVIDER_SITE_OTHER): Payer: Medicare HMO | Admitting: Internal Medicine

## 2022-08-11 ENCOUNTER — Encounter: Payer: Self-pay | Admitting: Internal Medicine

## 2022-08-11 VITALS — BP 120/80 | HR 71 | Temp 98.1°F | Ht 69.0 in | Wt 232.0 lb

## 2022-08-11 DIAGNOSIS — J0110 Acute frontal sinusitis, unspecified: Secondary | ICD-10-CM | POA: Diagnosis not present

## 2022-08-11 MED ORDER — AMOXICILLIN-POT CLAVULANATE 875-125 MG PO TABS: 1.0000 | ORAL_TABLET | Freq: Two times a day (BID) | ORAL | 0 refills | Status: AC

## 2022-08-11 MED ORDER — FLUTICASONE PROPIONATE 50 MCG/ACT NA SUSP: 2.0000 | Freq: Every day | NASAL | 3 refills | Status: DC | PRN

## 2022-08-11 NOTE — Assessment & Plan Note (Signed)
Rx augmentin 1 week course for sinus pain and pressure with drainage 2 weeks or so ago. Rx flonase as well to help with drainage.

## 2022-08-11 NOTE — Progress Notes (Signed)
   Subjective:   Patient ID: Donald [name], male    DOB: 12-Mar-1956, 67 y.o.   MRN: [id number]  HPI The patient is a 68 YO man coming in for sinus issues  Review of Systems  Constitutional:  Positive for activity change. Negative for appetite change, chills, fatigue, fever and unexpected weight change.  HENT:  Positive for congestion, postnasal drip, rhinorrhea, sinus pressure and sinus pain. Negative for ear discharge, ear pain, sneezing, sore throat, tinnitus, trouble swallowing and voice change.   Eyes: Negative.   Respiratory:  Negative for cough, chest tightness, shortness of breath and wheezing.   Cardiovascular: Negative.   Gastrointestinal: Negative.   Musculoskeletal:  Positive for myalgias.  Neurological: Negative.     Objective:  Physical Exam Constitutional:      Appearance: He is well-developed.  HENT:     Head: Normocephalic and atraumatic.     Comments: Oropharynx with redness and clear drainage, nose with swollen turbinates, TMs normal bilaterally.  Neck:     Thyroid: No thyromegaly.  Cardiovascular:     Rate and Rhythm: Normal rate and regular rhythm.  Pulmonary:     Effort: Pulmonary effort is normal. No respiratory distress.     Breath sounds: Normal breath sounds. No wheezing or rales.  Abdominal:     Palpations: Abdomen is soft.  Musculoskeletal:     Cervical back: Normal range of motion.  Lymphadenopathy:     Cervical: No cervical adenopathy.  Skin:    General: Skin is warm and dry.  Neurological:     Mental Status: He is alert and oriented to person, place, and time.     Vitals:   08/11/22 1526  BP: 120/80  Pulse: 71  Temp: 98.1 F (36.7 C)  TempSrc: Oral  SpO2: 95%  Weight: 232 lb (105.2 kg)  Height: 5\' 9"  (1.753 m)    Assessment & Plan:

## 2022-08-11 NOTE — Patient Instructions (Signed)
We have sent in the flonase for you. And augmentin to take 1 pill twice a day for 1 week.

## 2022-08-14 ENCOUNTER — Telehealth: Payer: Self-pay | Admitting: Internal Medicine

## 2022-08-14 NOTE — Telephone Encounter (Signed)
Patient was seen by Dr. Sharlet Salina 08/11/22 and was prescribed  amoxicillin-clavulanate (AUGMENTIN) 875-125 MG tablet . He said he is having trouble swallowing the pills, even after cutting them in half. He would like to know if anything else can be prescribed for him that would be easier to swallow. He would like it sent to [facility], [place]. Best callback is [phone].

## 2022-08-14 NOTE — Telephone Encounter (Signed)
Okay to crush and put in applesauce, yogurt or dissolve in liquid

## 2022-08-15 NOTE — Telephone Encounter (Signed)
Called back the patient and informed him per provider advise. Patient agreed and understood.

## 2022-08-24 ENCOUNTER — Encounter: Payer: Self-pay | Admitting: Diagnostic Neuroimaging

## 2022-08-24 ENCOUNTER — Ambulatory Visit: Payer: Medicare HMO | Admitting: Diagnostic Neuroimaging

## 2022-08-24 VITALS — Ht 70.0 in | Wt 227.0 lb

## 2022-08-24 DIAGNOSIS — R430 Anosmia: Secondary | ICD-10-CM

## 2022-08-24 NOTE — Patient Instructions (Signed)
  SMELL / TASTE IMPAIRMENT (post COVID) - 2020 post covid anosmia, then recovered - now worsened in 2023; check MRI brain to rule out structural etiologies  MILD MEMORY IMPAIRMENT (due to sleep disturbances (insomnia, freq urination, sleep apnea), chronic pain, pain medication) - continue brain healthy habits - continue CPAP treatment
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
## 2022-08-26 DIAGNOSIS — M5126 Other intervertebral disc displacement, lumbar region: Secondary | ICD-10-CM | POA: Diagnosis not present

## 2022-08-26 DIAGNOSIS — M961 Postlaminectomy syndrome, not elsewhere classified: Secondary | ICD-10-CM | POA: Diagnosis not present

## 2022-08-29 ENCOUNTER — Telehealth: Payer: Self-pay | Admitting: Diagnostic Neuroimaging

## 2022-08-29 NOTE — Telephone Encounter (Signed)
Craig Staggers: [id number] exp. 08/29/22-09/28/22 sent to GI

## 2022-09-05 NOTE — Addendum Note (Signed)
Addended by: Karle Barr on: 09/05/2022 10:55 AM   Modules accepted: Level of Service

## 2022-09-18 ENCOUNTER — Other Ambulatory Visit: Payer: Medicare HMO
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
## 2022-10-23 ENCOUNTER — Other Ambulatory Visit: Payer: Self-pay | Admitting: Cardiology

## 2022-10-23 ENCOUNTER — Other Ambulatory Visit: Payer: Self-pay | Admitting: Internal Medicine

## 2022-10-23 DIAGNOSIS — I10 Essential (primary) hypertension: Secondary | ICD-10-CM

## 2022-10-23 DIAGNOSIS — E785 Hyperlipidemia, unspecified: Secondary | ICD-10-CM

## 2022-10-25 ENCOUNTER — Ambulatory Visit: Payer: Medicare HMO | Admitting: Rheumatology

## 2022-10-25 DIAGNOSIS — M5136 Other intervertebral disc degeneration, lumbar region: Secondary | ICD-10-CM

## 2022-10-25 DIAGNOSIS — I119 Hypertensive heart disease without heart failure: Secondary | ICD-10-CM

## 2022-10-25 DIAGNOSIS — K219 Gastro-esophageal reflux disease without esophagitis: Secondary | ICD-10-CM

## 2022-10-25 DIAGNOSIS — M19011 Primary osteoarthritis, right shoulder: Secondary | ICD-10-CM

## 2022-10-25 DIAGNOSIS — M1612 Unilateral primary osteoarthritis, left hip: Secondary | ICD-10-CM

## 2022-10-25 DIAGNOSIS — M170 Bilateral primary osteoarthritis of knee: Secondary | ICD-10-CM

## 2022-10-25 DIAGNOSIS — G894 Chronic pain syndrome: Secondary | ICD-10-CM

## 2022-10-25 DIAGNOSIS — E785 Hyperlipidemia, unspecified: Secondary | ICD-10-CM

## 2022-10-25 DIAGNOSIS — M79672 Pain in left foot: Secondary | ICD-10-CM

## 2022-10-25 DIAGNOSIS — I10 Essential (primary) hypertension: Secondary | ICD-10-CM

## 2022-10-25 DIAGNOSIS — K7400 Hepatic fibrosis, unspecified: Secondary | ICD-10-CM

## 2022-10-25 DIAGNOSIS — N401 Enlarged prostate with lower urinary tract symptoms: Secondary | ICD-10-CM

## 2022-10-25 DIAGNOSIS — M5030 Other cervical disc degeneration, unspecified cervical region: Secondary | ICD-10-CM

## 2022-10-25 DIAGNOSIS — G4731 Primary central sleep apnea: Secondary | ICD-10-CM

## 2022-10-25 DIAGNOSIS — R700 Elevated erythrocyte sedimentation rate: Secondary | ICD-10-CM

## 2022-10-25 DIAGNOSIS — Z96641 Presence of right artificial hip joint: Secondary | ICD-10-CM

## 2022-10-25 DIAGNOSIS — F4310 Post-traumatic stress disorder, unspecified: Secondary | ICD-10-CM

## 2022-10-25 DIAGNOSIS — M19042 Primary osteoarthritis, left hand: Secondary | ICD-10-CM

## 2022-10-25 DIAGNOSIS — E1165 Type 2 diabetes mellitus with hyperglycemia: Secondary | ICD-10-CM

## 2022-10-25 DIAGNOSIS — B182 Chronic viral hepatitis C: Secondary | ICD-10-CM

## 2022-11-06 ENCOUNTER — Other Ambulatory Visit: Payer: Self-pay | Admitting: Cardiology

## 2022-11-06 ENCOUNTER — Other Ambulatory Visit: Payer: Self-pay | Admitting: Internal Medicine

## 2022-11-06 DIAGNOSIS — N401 Enlarged prostate with lower urinary tract symptoms: Secondary | ICD-10-CM

## 2022-11-06 DIAGNOSIS — I10 Essential (primary) hypertension: Secondary | ICD-10-CM

## 2022-11-06 DIAGNOSIS — N201 Calculus of ureter: Secondary | ICD-10-CM

## 2022-11-06 DIAGNOSIS — E119 Type 2 diabetes mellitus without complications: Secondary | ICD-10-CM

## 2022-11-06 MED ORDER — MELOXICAM 15 MG PO TABS: 15.0000 mg | ORAL_TABLET | Freq: Every day | ORAL | 0 refills | Status: DC

## 2022-11-06 MED ORDER — TAMSULOSIN HCL 0.4 MG PO CAPS: 0.4000 mg | ORAL_CAPSULE | Freq: Every day | ORAL | 0 refills | Status: DC

## 2022-11-06 MED ORDER — MOUNJARO 7.5 MG/0.5ML ~~LOC~~ SOAJ: 7.5000 mg | SUBCUTANEOUS | 0 refills | Status: DC

## 2022-11-07 ENCOUNTER — Other Ambulatory Visit: Payer: Self-pay

## 2022-11-07 DIAGNOSIS — I10 Essential (primary) hypertension: Secondary | ICD-10-CM

## 2022-11-07 MED ORDER — HYDROCHLOROTHIAZIDE 25 MG PO TABS: 25.0000 mg | ORAL_TABLET | Freq: Every morning | ORAL | 0 refills | Status: DC

## 2022-11-16 ENCOUNTER — Encounter: Payer: Self-pay | Admitting: Cardiology

## 2022-11-16 ENCOUNTER — Ambulatory Visit: Payer: Medicare HMO | Admitting: Cardiology

## 2022-11-16 VITALS — BP 123/77 | HR 69 | Ht 70.0 in | Wt 228.0 lb

## 2022-11-16 DIAGNOSIS — I10 Essential (primary) hypertension: Secondary | ICD-10-CM

## 2022-11-16 DIAGNOSIS — E6609 Other obesity due to excess calories: Secondary | ICD-10-CM | POA: Diagnosis not present

## 2022-11-16 DIAGNOSIS — E66811 Obesity, class 1: Secondary | ICD-10-CM

## 2022-11-16 DIAGNOSIS — Z6832 Body mass index (BMI) 32.0-32.9, adult: Secondary | ICD-10-CM | POA: Diagnosis not present

## 2022-11-16 DIAGNOSIS — G4733 Obstructive sleep apnea (adult) (pediatric): Secondary | ICD-10-CM

## 2022-11-16 DIAGNOSIS — E1165 Type 2 diabetes mellitus with hyperglycemia: Secondary | ICD-10-CM

## 2022-11-16 DIAGNOSIS — Z87891 Personal history of nicotine dependence: Secondary | ICD-10-CM

## 2022-11-16 MED ORDER — HYDROCHLOROTHIAZIDE 25 MG PO TABS
25.0000 mg | ORAL_TABLET | Freq: Every morning | ORAL | 0 refills | Status: DC
Start: 2022-11-16 — End: 2023-01-03

## 2022-11-16 MED ORDER — LOSARTAN POTASSIUM 25 MG PO TABS
25.0000 mg | ORAL_TABLET | Freq: Every day | ORAL | 3 refills | Status: DC
Start: 2022-11-16 — End: 2023-01-23
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
## 2022-11-20 DIAGNOSIS — M48062 Spinal stenosis, lumbar region with neurogenic claudication: Secondary | ICD-10-CM | POA: Diagnosis not present

## 2022-11-20 DIAGNOSIS — M961 Postlaminectomy syndrome, not elsewhere classified: Secondary | ICD-10-CM | POA: Diagnosis not present

## 2022-11-20 DIAGNOSIS — G894 Chronic pain syndrome: Secondary | ICD-10-CM | POA: Diagnosis not present

## 2022-11-30 ENCOUNTER — Encounter: Payer: Self-pay | Admitting: Internal Medicine

## 2022-11-30 ENCOUNTER — Ambulatory Visit (INDEPENDENT_AMBULATORY_CARE_PROVIDER_SITE_OTHER): Payer: Medicare HMO | Admitting: Internal Medicine

## 2022-11-30 VITALS — BP 122/74 | HR 66 | Temp 97.6°F | Resp 16 | Ht 70.0 in | Wt 228.0 lb

## 2022-11-30 DIAGNOSIS — I10 Essential (primary) hypertension: Secondary | ICD-10-CM

## 2022-11-30 DIAGNOSIS — E785 Hyperlipidemia, unspecified: Secondary | ICD-10-CM

## 2022-11-30 DIAGNOSIS — E119 Type 2 diabetes mellitus without complications: Secondary | ICD-10-CM | POA: Diagnosis not present

## 2022-11-30 DIAGNOSIS — H9311 Tinnitus, right ear: Secondary | ICD-10-CM | POA: Diagnosis not present

## 2022-11-30 LAB — CBC WITH DIFFERENTIAL/PLATELET
Basophils Absolute: 0 10*3/uL (ref 0.0–0.1)
Basophils Relative: 0.3 % (ref 0.0–3.0)
Eosinophils Absolute: 0.1 10*3/uL (ref 0.0–0.7)
Eosinophils Relative: 0.8 % (ref 0.0–5.0)
HCT: 41.7 % (ref 39.0–52.0)
Hemoglobin: 13.4 g/dL (ref 13.0–17.0)
Lymphocytes Relative: 33.4 % (ref 12.0–46.0)
Lymphs Abs: 2.7 10*3/uL (ref 0.7–4.0)
MCHC: 32.2 g/dL (ref 30.0–36.0)
MCV: 91 fl (ref 78.0–100.0)
Monocytes Absolute: 0.6 10*3/uL (ref 0.1–1.0)
Monocytes Relative: 7.8 % (ref 3.0–12.0)
Neutro Abs: 4.7 10*3/uL (ref 1.4–7.7)
Neutrophils Relative %: 57.7 % (ref 43.0–77.0)
Platelets: 187 10*3/uL (ref 150.0–400.0)
RBC: 4.58 Mil/uL (ref 4.22–5.81)
RDW: 12.9 % (ref 11.5–15.5)
WBC: 8.1 10*3/uL (ref 4.0–10.5)

## 2022-11-30 LAB — URINALYSIS, ROUTINE W REFLEX MICROSCOPIC
Bilirubin Urine: NEGATIVE
Hgb urine dipstick: NEGATIVE
Leukocytes,Ua: NEGATIVE
Nitrite: NEGATIVE
RBC / HPF: NONE SEEN (ref 0–?)
Specific Gravity, Urine: >=1.03 — AB (ref 1.000–1.030)
Total Protein, Urine: NEGATIVE
Urine Glucose: NEGATIVE
Urobilinogen, UA: 0.2 (ref 0.0–1.0)
pH: 5.5 (ref 5.0–8.0)

## 2022-11-30 LAB — BASIC METABOLIC PANEL
BUN: 20 mg/dL (ref 6–23)
CO2: 34 mEq/L — ABNORMAL HIGH (ref 19–32)
Calcium: 9.9 mg/dL (ref 8.4–10.5)
Chloride: 99 mEq/L (ref 96–112)
Creatinine, Ser: 0.97 mg/dL (ref 0.40–1.50)
GFR: 80.37 mL/min (ref >60.00)
Glucose, Bld: 90 mg/dL (ref 70–99)
Potassium: 3.8 mEq/L (ref 3.5–5.1)
Sodium: 140 mEq/L (ref 135–145)

## 2022-11-30 LAB — LIPID PANEL
Cholesterol: 129 mg/dL (ref 0–200)
HDL: 57.6 mg/dL (ref >39.00)
LDL Cholesterol: 52 mg/dL (ref 0–99)
NonHDL: 71.49
Total CHOL/HDL Ratio: 2
Triglycerides: 96 mg/dL (ref 0.0–149.0)
VLDL: 19.2 mg/dL (ref 0.0–40.0)

## 2022-11-30 LAB — MICROALBUMIN / CREATININE URINE RATIO
Creatinine,U: 159 mg/dL
Microalb Creat Ratio: 0.4 mg/g (ref 0.0–30.0)
Microalb, Ur: <0.7 mg/dL (ref 0.0–1.9)

## 2022-11-30 LAB — HEMOGLOBIN A1C: Hgb A1c MFr Bld: 5.9 % (ref 4.6–6.5)

## 2022-11-30 MED ORDER — TIRZEPATIDE 10 MG/0.5ML ~~LOC~~ SOAJ
10.0000 mg | SUBCUTANEOUS | 0 refills | Status: DC
Start: 2022-11-30 — End: 2023-01-23

## 2022-11-30 NOTE — Progress Notes (Signed)
Subjective:  Patient ID: Donald [name], male    DOB: 1956-01-11  Age: 67 y.o. MRN: [id number]  CC: Hypertension, Diabetes, and Hyperlipidemia   HPI [name] presents for f/up ----  Discussed the use of AI scribe software for clinical note transcription with the patient, who gave verbal consent to proceed.  History of Present Illness   The patient, with a history of diabetes and sleep apnea, presents for a routine follow-up. He reports no new cardiac symptoms and a recent EKG was normal. The patient denies lightheadedness or dizziness and reports good control of their blood pressure.  Regarding his diabetes, the patient has been on Mounjaro 7.5mg  and reports no symptoms related to high blood sugar. He has concerns about his weight, currently at 228 lbs, and express a desire to lose more weight. He reports occasional right flank pain, described as a 'twitch,' but deny any blood in urine or stool.  The patient also has a history of BPH for which he is on Flomax. He reports a noticeable improvement in urine flow with the medication but had experienced difficulty urinating when he missed a few doses. He is considering a follow-up with his urologist.  Lastly, the patient is being treated for sleep apnea and is considering an update to his CPAP machine. He is followed by a neurologist for this condition.       Outpatient Medications Prior to Visit  Medication Sig Dispense Refill   b complex-vitamin c-folic acid (NEPHRO-VITE) 0.8 MG TABS tablet Take by mouth.     buprenorphine-naloxone (SUBOXONE) 8-2 mg SUBL SL tablet Place 1 tablet under the tongue daily.     Cholecalciferol (VITAMIN D3) 125 MCG (5000 UT) CAPS Take 5,000 Units by mouth in the morning and at bedtime.     Cyanocobalamin (B-12 PO) Take 4 drops by mouth daily.     fluticasone (FLONASE) 50 MCG/ACT nasal spray Place 2 sprays into both nostrils daily as needed for allergies or rhinitis. 48 g 3   Folic Acid-Vit B6-Vit B12  4.0-9-8.1 MG TABS Take 1 tablet by mouth daily. B6 Complex     hydrochlorothiazide (HYDRODIURIL) 25 MG tablet Take 1 tablet (25 mg total) by mouth every morning. 30 tablet 0   loratadine (CLARITIN) 10 MG tablet Take by mouth.     losartan (COZAAR) 25 MG tablet Take 1 tablet (25 mg total) by mouth daily. 90 tablet 3   Multiple Vitamin (MULTI-VITAMIN) tablet Take 1 tablet by mouth daily.     NON FORMULARY Take 5 drops by mouth daily. Vitamin A,D,K daily     Omega 3-6-9 Fatty Acids (OMEGA-3-6-9 PO) Take 15 mLs by mouth daily. 15.9 g     omeprazole (PRILOSEC) 20 MG capsule TAKE 1 CAPSULE BY MOUTH ONCE DAILY AS NEEDED FOR  ACID  REFLUX 90 capsule 0   rosuvastatin (CRESTOR) 10 MG tablet Take 1 tablet by mouth once daily 90 tablet 0   tamsulosin (FLOMAX) 0.4 MG CAPS capsule Take 1 capsule (0.4 mg total) by mouth daily. Follow-up appt due in July MD states return in about 6 months (around 12/27/2022). 30 capsule 0   tirzepatide (MOUNJARO) 7.5 MG/0.5ML Pen Inject 7.5 mg into the skin once a week. Follow-up appt due in July MD states return in about 6 months (around 12/27/2022). 2 mL 0   No facility-administered medications prior to visit.    ROS Review of Systems  Constitutional:  Negative for appetite change, chills, diaphoresis and fatigue.  HENT:  Positive  for tinnitus. Negative for ear pain and hearing loss.   Eyes: Negative.   Respiratory:  Positive for apnea. Negative for cough, shortness of breath and wheezing.   Cardiovascular:  Negative for chest pain, palpitations and leg swelling.  Gastrointestinal: Negative.  Negative for abdominal pain, constipation, diarrhea, nausea and vomiting.  Endocrine: Negative.   Genitourinary: Negative.  Negative for difficulty urinating and dysuria.  Musculoskeletal:  Negative for arthralgias, back pain and myalgias.  Skin: Negative.   Neurological:  Negative for dizziness, weakness and headaches.  Hematological:  Negative for adenopathy. Does not bruise/bleed  easily.  Psychiatric/Behavioral: Negative.  Negative for behavioral problems and confusion.     Objective:  BP 122/74 (BP Location: Right Arm, Patient Position: Sitting, Cuff Size: Large)   Pulse 66   Temp 97.6 F (36.4 C) (Oral)   Resp 16   Ht 5\' 10"  (1.778 m)   Wt 228 lb (103.4 kg)   SpO2 95%   BMI 32.71 kg/m   BP Readings from Last 3 Encounters:  11/30/22 122/74  11/16/22 123/77  08/11/22 120/80    Wt Readings from Last 3 Encounters:  11/30/22 228 lb (103.4 kg)  11/16/22 228 lb (103.4 kg)  08/24/22 227 lb (103 kg)    Physical Exam Vitals reviewed.  Constitutional:      Appearance: Normal appearance.  HENT:     Right Ear: Hearing, tympanic membrane, ear canal and external ear normal.     Left Ear: Tympanic membrane, ear canal and external ear normal.     Mouth/Throat:     Mouth: Mucous membranes are moist.  Eyes:     General: No scleral icterus.    Conjunctiva/sclera: Conjunctivae normal.  Cardiovascular:     Rate and Rhythm: Normal rate and regular rhythm.     Heart sounds: No murmur heard.    No friction rub. No gallop.  Pulmonary:     Effort: Pulmonary effort is normal.     Breath sounds: No stridor. No wheezing, rhonchi or rales.  Abdominal:     General: Abdomen is flat.     Palpations: There is no mass.     Tenderness: There is no abdominal tenderness. There is no guarding.     Hernia: No hernia is present.  Musculoskeletal:        General: Normal range of motion.     Cervical back: Neck supple.     Right lower leg: No edema.     Left lower leg: No edema.  Lymphadenopathy:     Cervical: No cervical adenopathy.  Skin:    General: Skin is warm.  Neurological:     General: No focal deficit present.     Mental Status: He is alert. Mental status is at baseline.  Psychiatric:        Mood and Affect: Mood normal.        Behavior: Behavior normal.     Lab Results  Component Value Date   WBC 8.1 11/30/2022   HGB 13.4 11/30/2022   HCT 41.7  11/30/2022   PLT 187.0 11/30/2022   GLUCOSE 90 11/30/2022   CHOL 129 11/30/2022   TRIG 96.0 11/30/2022   HDL 57.60 11/30/2022   LDLCALC 52 11/30/2022   ALT 20 06/28/2022   AST 20 06/28/2022   NA 140 11/30/2022   K 3.8 11/30/2022   CL 99 11/30/2022   CREATININE 0.97 11/30/2022   BUN 20 11/30/2022   CO2 34 (H) 11/30/2022   TSH 1.01 01/25/2022   PSA  0.75 01/25/2022   INR 1.0 06/28/2022   HGBA1C 5.9 11/30/2022   MICROALBUR <0.7 11/30/2022    CT RENAL STONE STUDY  Result Date: 03/30/2021 CLINICAL DATA:  67 year old male with history of right-sided flank pain for the past 6 days. EXAM: CT ABDOMEN AND PELVIS WITHOUT CONTRAST TECHNIQUE: Multidetector CT imaging of the abdomen and pelvis was performed following the standard protocol without IV contrast. COMPARISON:  CT of the abdomen and pelvis 03/25/2021. FINDINGS: Lower chest: Unremarkable. Hepatobiliary: No suspicious cystic or solid hepatic lesions are confidently identified on today's noncontrast CT examination. Unenhanced appearance of the gallbladder is normal. Pancreas: No definite pancreatic mass or peripancreatic fluid collections or inflammatory changes are noted on today's noncontrast CT examination. Spleen: Unremarkable. Adrenals/Urinary Tract: There are no abnormal calcifications within the collecting system of either kidney, along the course of either ureter, or within the lumen of the urinary bladder. No hydroureteronephrosis or perinephric stranding to suggest urinary tract obstruction at this time. The unenhanced appearance of the kidneys is unremarkable bilaterally. Unenhanced appearance of the urinary bladder is grossly unremarkable (urinary bladder is nearly completely decompressed and partially obscured by beam hardening artifact from the patient's right hip arthroplasty). Bilateral adrenal glands are normal in appearance. Stomach/Bowel: Unenhanced appearance of the stomach is normal. No pathologic dilatation of small bowel or  colon. Status post right hemicolectomy. Vascular/Lymphatic: Atherosclerotic calcifications in the pelvic vasculature. No lymphadenopathy noted in the abdomen or pelvis. Reproductive: Prostate gland and seminal vesicles are unremarkable in appearance. Other: No significant volume of ascites.  No pneumoperitoneum. Musculoskeletal: Posterior fixation hardware is noted extending from L4-S1. Status post right hip arthroplasty. There are no aggressive appearing lytic or blastic lesions noted in the visualized portions of the skeleton. IMPRESSION: 1. No acute findings are noted in the abdomen or pelvis to account for the patient's symptoms. Specifically, no urinary tract calculi no findings of urinary tract obstruction are noted at this time. 2. Atherosclerosis. 3. Additional incidental findings, as above. Electronically Signed   By: Trudie Reed M.D.   On: 03/30/2021 11:36    Assessment & Plan:   Hyperlipidemia with target LDL less than 100- LDL goal achieved. Doing well on the statin  -     Lipoprotein A (LPA); Future -     Lipid panel; Future  Primary hypertension- His BP is well controlled. -     Basic metabolic panel; Future -     Urinalysis, Routine w reflex microscopic; Future -     Microalbumin / creatinine urine ratio; Future -     CBC with Differential/Platelet; Future  Type 2 diabetes mellitus without complication, without long-term current use of insulin (HCC) - His blood sugar is well controlled. -     Basic metabolic panel; Future -     Hemoglobin A1c; Future -     Urinalysis, Routine w reflex microscopic; Future -     Microalbumin / creatinine urine ratio; Future -     HM Diabetes Foot Exam -     Tirzepatide; Inject 10 mg into the skin once a week.  Dispense: 6 mL; Refill: 0  Tinnitus aurium, right -     Ambulatory referral to ENT     Follow-up: Return in about 4 months (around 04/01/2023).  Sanda Linger, MD

## 2022-11-30 NOTE — Patient Instructions (Signed)

## 2022-12-01 ENCOUNTER — Encounter: Payer: Self-pay | Admitting: Diagnostic Neuroimaging

## 2022-12-02 ENCOUNTER — Ambulatory Visit
Admission: RE | Admit: 2022-12-02 | Discharge: 2022-12-02 | Disposition: A | Discharge status: Home / Self Care | Payer: Medicare HMO | Source: Ambulatory Visit | Attending: Diagnostic Neuroimaging | Admitting: Diagnostic Neuroimaging

## 2022-12-02 DIAGNOSIS — R430 Anosmia: Secondary | ICD-10-CM

## 2022-12-04 ENCOUNTER — Other Ambulatory Visit: Payer: Self-pay | Admitting: Internal Medicine

## 2022-12-04 DIAGNOSIS — N201 Calculus of ureter: Secondary | ICD-10-CM

## 2022-12-04 DIAGNOSIS — N401 Enlarged prostate with lower urinary tract symptoms: Secondary | ICD-10-CM

## 2022-12-04 LAB — LIPOPROTEIN A (LPA): Lipoprotein (a): 139 nmol/L — ABNORMAL HIGH (ref <75)

## 2022-12-11 NOTE — Telephone Encounter (Signed)
Pt stated he needs to go some where with a smaller machine to get MRI. Pt requesting a call back from nurse.

## 2022-12-11 NOTE — Telephone Encounter (Signed)
This is the reason why the MRI was cancelled at GI: Patient (Pt is claustrophobic; will need meds & needs to be rescheduled in RM #1)    I am guessing she meant a bigger machine, which is what their room #1 is. Once he has medicine he can call [facility] back to get rescheduled. [phone]

## 2022-12-12 NOTE — Telephone Encounter (Signed)
I called patient.  He is actually requesting a smaller MRI scanner.  He is convinced that a more "slender" machine will help him with his claustrophobia.  I am unsure of what exactly he is talking about but I encouraged him to call [facility] back.  However, patient is wondering if there are medications he can take to help with his claustrophobia.  I advised him I will speak with Dr. Marjory Lies and we will let him know.

## 2022-12-13 MED ORDER — ALPRAZOLAM 0.5 MG PO TABS: ORAL_TABLET | ORAL | 0 refills | Status: DC

## 2022-12-13 NOTE — Telephone Encounter (Signed)
Meds ordered this encounter  Medications   ALPRAZolam (XANAX) 0.5 MG tablet    Sig: for sedation before MRI scan; take 1 tab 1 hour before scan; may repeat 1 tab 15 min before scan    Dispense:  3 tablet    Refill:  0   Caution with current suboxone use.   Suanne Marker, MD 12/13/2022, 6:21 PM Certified in Neurology, Neurophysiology and Neuroimaging  [facility] [address] [place], [place] [address] [phone]

## 2022-12-13 NOTE — Addendum Note (Signed)
Addended by: Joycelyn Schmid R on: 12/13/2022 06:21 PM   Modules accepted: Orders

## 2022-12-14 ENCOUNTER — Encounter: Payer: Self-pay | Admitting: Neurology

## 2023-01-02 ENCOUNTER — Other Ambulatory Visit: Payer: Self-pay | Admitting: Cardiology

## 2023-01-02 DIAGNOSIS — I10 Essential (primary) hypertension: Secondary | ICD-10-CM

## 2023-01-03 ENCOUNTER — Encounter (INDEPENDENT_AMBULATORY_CARE_PROVIDER_SITE_OTHER): Payer: Self-pay

## 2023-01-23 ENCOUNTER — Encounter: Payer: Self-pay | Admitting: Internal Medicine

## 2023-01-23 ENCOUNTER — Ambulatory Visit (INDEPENDENT_AMBULATORY_CARE_PROVIDER_SITE_OTHER): Payer: Medicare HMO | Admitting: Internal Medicine

## 2023-01-23 VITALS — BP 102/62 | HR 73 | Temp 97.7°F | Ht 70.0 in | Wt 226.0 lb

## 2023-01-23 DIAGNOSIS — I10 Essential (primary) hypertension: Secondary | ICD-10-CM | POA: Diagnosis not present

## 2023-01-23 DIAGNOSIS — N401 Enlarged prostate with lower urinary tract symptoms: Secondary | ICD-10-CM | POA: Diagnosis not present

## 2023-01-23 DIAGNOSIS — E119 Type 2 diabetes mellitus without complications: Secondary | ICD-10-CM

## 2023-01-23 DIAGNOSIS — G4733 Obstructive sleep apnea (adult) (pediatric): Secondary | ICD-10-CM | POA: Diagnosis not present

## 2023-01-23 DIAGNOSIS — R350 Frequency of micturition: Secondary | ICD-10-CM | POA: Diagnosis not present

## 2023-01-23 DIAGNOSIS — K7400 Hepatic fibrosis, unspecified: Secondary | ICD-10-CM | POA: Diagnosis not present

## 2023-01-23 LAB — BASIC METABOLIC PANEL
BUN: 21 mg/dL (ref 6–23)
CO2: 36 mEq/L — ABNORMAL HIGH (ref 19–32)
Calcium: 9.3 mg/dL (ref 8.4–10.5)
Chloride: 98 mEq/L (ref 96–112)
Creatinine, Ser: 0.85 mg/dL (ref 0.40–1.50)
GFR: 89.37 mL/min (ref >60.00)
Glucose, Bld: 97 mg/dL (ref 70–99)
Potassium: 3.6 mEq/L (ref 3.5–5.1)
Sodium: 140 mEq/L (ref 135–145)

## 2023-01-23 LAB — HEPATIC FUNCTION PANEL
ALT: 17 U/L (ref 0–53)
AST: 17 U/L (ref 0–37)
Albumin: 4.3 g/dL (ref 3.5–5.2)
Alkaline Phosphatase: 43 U/L (ref 39–117)
Bilirubin, Direct: 0.1 mg/dL (ref 0.0–0.3)
Total Bilirubin: 0.3 mg/dL (ref 0.2–1.2)
Total Protein: 7.3 g/dL (ref 6.0–8.3)

## 2023-01-23 LAB — PROTIME-INR
INR: 1.1 ratio — ABNORMAL HIGH (ref 0.8–1.0)
Prothrombin Time: 11.4 s (ref 9.6–13.1)

## 2023-01-23 LAB — PSA: PSA: 0.92 ng/mL (ref 0.10–4.00)

## 2023-01-23 LAB — TSH: TSH: 0.92 u[IU]/mL (ref 0.35–5.50)

## 2023-01-23 MED ORDER — TIRZEPATIDE 7.5 MG/0.5ML ~~LOC~~ SOAJ
7.5000 mg | SUBCUTANEOUS | 1 refills | Status: DC
Start: 2023-01-23 — End: 2023-06-12

## 2023-01-23 NOTE — Progress Notes (Addendum)
Subjective:  Patient ID: Donald [name], male    DOB: 06-28-1955  Age: 67 y.o. MRN: [id number]  CC: Hypertension and Diabetes   HPI Donald [name] presents for f/up ----  He is active and denies chest pain, shortness of breath, diaphoresis, or edema.  He complains of sleepiness and would like to follow-up with his sleep specialist.  Donald [name] has caused nausea and he would like to go down to the 7.5 mg dosage.  He denies vomiting, abdominal pain, odynophagia, dysphagia, diarrhea, constipation, or blood in his stool.  Outpatient Medications Prior to Visit  Medication Sig Dispense Refill   ALPRAZolam (XANAX) 0.5 MG tablet for sedation before MRI scan; take 1 tab 1 hour before scan; may repeat 1 tab 15 min before scan 3 tablet 0   b complex-vitamin c-folic acid (NEPHRO-VITE) 0.8 MG TABS tablet Take by mouth.     buprenorphine-naloxone (SUBOXONE) 8-2 mg SUBL SL tablet Place 1 tablet under the tongue daily.     Cholecalciferol (VITAMIN D3) 125 MCG (5000 UT) CAPS Take 5,000 Units by mouth in the morning and at bedtime.     Cyanocobalamin (B-12 PO) Take 4 drops by mouth daily.     fluticasone (FLONASE) 50 MCG/ACT nasal spray Place 2 sprays into both nostrils daily as needed for allergies or rhinitis. 48 g 3   Folic Acid-Vit B6-Vit B12 0.5-5-0.2 MG TABS Take 1 tablet by mouth daily. B6 Complex     loratadine (CLARITIN) 10 MG tablet Take by mouth.     meloxicam (MOBIC) 15 MG tablet Take 1 tablet (15 mg total) by mouth daily. 90 tablet 0   Multiple Vitamin (MULTI-VITAMIN) tablet Take 1 tablet by mouth daily.     NON FORMULARY Take 5 drops by mouth daily. Vitamin A,D,K daily     Omega 3-6-9 Fatty Acids (OMEGA-3-6-9 PO) Take 15 mLs by mouth daily. 15.9 g     omeprazole (PRILOSEC) 20 MG capsule TAKE 1 CAPSULE BY MOUTH ONCE DAILY AS NEEDED FOR  ACID  REFLUX 90 capsule 0   rosuvastatin (CRESTOR) 10 MG tablet Take 1 tablet by mouth once daily 90 tablet 0   tamsulosin (FLOMAX) 0.4 MG CAPS capsule Take 1  capsule (0.4 mg total) by mouth daily. 90 capsule 0   hydrochlorothiazide (HYDRODIURIL) 25 MG tablet TAKE 1 TABLET BY MOUTH ONCE DAILY IN THE MORNING 30 tablet 0   losartan (COZAAR) 25 MG tablet Take 1 tablet (25 mg total) by mouth daily. 90 tablet 3   tirzepatide (MOUNJARO) 10 MG/0.5ML Pen Inject 10 mg into the skin once a week. 6 mL 0   No facility-administered medications prior to visit.    ROS Review of Systems  Constitutional:  Positive for fatigue. Negative for appetite change, chills, diaphoresis and unexpected weight change.  HENT: Negative.    Eyes:  Negative for visual disturbance.  Respiratory:  Positive for apnea. Negative for cough, shortness of breath and wheezing.   Cardiovascular:  Negative for chest pain, palpitations and leg swelling.  Gastrointestinal:  Positive for nausea. Negative for abdominal pain, anal bleeding, blood in stool, constipation and vomiting.  Genitourinary: Negative.  Negative for difficulty urinating and dysuria.  Musculoskeletal: Negative.   Skin: Negative.   Neurological:  Negative for dizziness and weakness.  Hematological:  Negative for adenopathy. Does not bruise/bleed easily.  Psychiatric/Behavioral:  Positive for confusion and decreased concentration. Negative for dysphoric mood and suicidal ideas. The patient is not nervous/anxious.     Objective:  BP 102/62 (BP  Location: Right Arm, Patient Position: Sitting, Cuff Size: Large)   Pulse 73   Temp 97.7 F (36.5 C) (Oral)   Ht 5\' 10"  (1.778 m)   Wt 226 lb (102.5 kg)   SpO2 95%   BMI 32.43 kg/m   BP Readings from Last 3 Encounters:  01/23/23 102/62  11/30/22 122/74  11/16/22 123/77    Wt Readings from Last 3 Encounters:  01/23/23 226 lb (102.5 kg)  11/30/22 228 lb (103.4 kg)  11/16/22 228 lb (103.4 kg)    Physical Exam Vitals reviewed.  Constitutional:      General: He is not in acute distress.    Appearance: He is not ill-appearing, toxic-appearing or diaphoretic.  HENT:      Nose: Nose normal.     Mouth/Throat:     Mouth: Mucous membranes are moist.  Eyes:     General: No scleral icterus.    Conjunctiva/sclera: Conjunctivae normal.  Cardiovascular:     Rate and Rhythm: Normal rate and regular rhythm.     Heart sounds: No murmur heard.    No gallop.  Pulmonary:     Effort: Pulmonary effort is normal.     Breath sounds: No stridor. No wheezing, rhonchi or rales.  Abdominal:     General: Abdomen is flat.     Palpations: There is no mass.     Tenderness: There is no abdominal tenderness. There is no guarding.     Hernia: No hernia is present.  Musculoskeletal:        General: No swelling. Normal range of motion.     Cervical back: Neck supple.  Lymphadenopathy:     Cervical: No cervical adenopathy.  Skin:    General: Skin is warm and dry.  Neurological:     General: No focal deficit present.     Mental Status: He is alert.  Psychiatric:        Attention and Perception: Attention normal.        Mood and Affect: Mood and affect normal.        Speech: Speech is delayed. Speech is not tangential.        Behavior: Behavior normal.        Thought Content: Thought content normal. Thought content is not paranoid or delusional. Thought content does not include homicidal or suicidal ideation.        Cognition and Memory: Cognition is impaired. Memory is impaired.     Lab Results  Component Value Date   WBC 8.1 11/30/2022   HGB 13.4 11/30/2022   HCT 41.7 11/30/2022   PLT 187.0 11/30/2022   GLUCOSE 97 01/23/2023   CHOL 129 11/30/2022   TRIG 96.0 11/30/2022   HDL 57.60 11/30/2022   LDLCALC 52 11/30/2022   ALT 17 01/23/2023   AST 17 01/23/2023   NA 140 01/23/2023   K 3.6 01/23/2023   CL 98 01/23/2023   CREATININE 0.85 01/23/2023   BUN 21 01/23/2023   CO2 36 (H) 01/23/2023   TSH 0.92 01/23/2023   PSA 0.92 01/23/2023   INR 1.1 (H) 01/23/2023   HGBA1C 5.9 11/30/2022   MICROALBUR <0.7 11/30/2022    No results found.  Assessment & Plan:    Primary hypertension- Jis blood pressure is overcontrolled.  Will discontinue the ARB and thiazide diuretic. -     TSH; Future -     Hepatic function panel; Future -     Basic metabolic panel; Future  Type 2 diabetes mellitus without complication, without long-term  current use of insulin (HCC) -     Tirzepatide; Inject 7.5 mg into the skin once a week.  Dispense: 6 mL; Refill: 1 -     Ambulatory referral to Ophthalmology -     Basic metabolic panel; Future  OBSTRUCTIVE SLEEP APNEA -     Ambulatory referral to Sleep Studies  Liver fibrosis -     Protime-INR; Future -     Hepatic function panel; Future  Benign prostatic hyperplasia with urinary frequency -     PSA; Future     Follow-up: Return in about 3 months (around 04/25/2023).  Sanda Linger, MD

## 2023-01-23 NOTE — Patient Instructions (Signed)

## 2023-02-08 ENCOUNTER — Encounter: Payer: Self-pay | Admitting: Internal Medicine

## 2023-02-14 ENCOUNTER — Other Ambulatory Visit: Payer: Self-pay | Admitting: Internal Medicine

## 2023-02-14 ENCOUNTER — Other Ambulatory Visit: Payer: Self-pay | Admitting: Cardiology

## 2023-02-14 DIAGNOSIS — E785 Hyperlipidemia, unspecified: Secondary | ICD-10-CM

## 2023-02-14 DIAGNOSIS — I10 Essential (primary) hypertension: Secondary | ICD-10-CM

## 2023-02-15 ENCOUNTER — Other Ambulatory Visit: Payer: Self-pay | Admitting: Internal Medicine

## 2023-02-15 ENCOUNTER — Other Ambulatory Visit: Payer: Self-pay | Admitting: Physician Assistant

## 2023-02-15 DIAGNOSIS — R1310 Dysphagia, unspecified: Secondary | ICD-10-CM | POA: Diagnosis not present

## 2023-02-15 DIAGNOSIS — H903 Sensorineural hearing loss, bilateral: Secondary | ICD-10-CM | POA: Diagnosis not present

## 2023-02-15 DIAGNOSIS — H9202 Otalgia, left ear: Secondary | ICD-10-CM | POA: Diagnosis not present

## 2023-02-15 DIAGNOSIS — H9201 Otalgia, right ear: Secondary | ICD-10-CM | POA: Insufficient documentation

## 2023-02-15 DIAGNOSIS — H9193 Unspecified hearing loss, bilateral: Secondary | ICD-10-CM | POA: Diagnosis not present

## 2023-02-15 DIAGNOSIS — M2669 Other specified disorders of temporomandibular joint: Secondary | ICD-10-CM | POA: Diagnosis not present

## 2023-02-23 ENCOUNTER — Ambulatory Visit
Admission: RE | Admit: 2023-02-23 | Discharge: 2023-02-23 | Disposition: A | Discharge status: Home / Self Care | Payer: Medicare HMO | Source: Ambulatory Visit | Attending: Physician Assistant | Admitting: Physician Assistant

## 2023-02-23 DIAGNOSIS — K229 Disease of esophagus, unspecified: Secondary | ICD-10-CM | POA: Diagnosis not present

## 2023-02-23 DIAGNOSIS — R1310 Dysphagia, unspecified: Secondary | ICD-10-CM | POA: Diagnosis not present

## 2023-02-27 ENCOUNTER — Other Ambulatory Visit: Payer: Self-pay | Admitting: Internal Medicine

## 2023-02-27 DIAGNOSIS — N401 Enlarged prostate with lower urinary tract symptoms: Secondary | ICD-10-CM

## 2023-02-27 DIAGNOSIS — N201 Calculus of ureter: Secondary | ICD-10-CM

## 2023-02-28 ENCOUNTER — Other Ambulatory Visit: Payer: Self-pay | Admitting: Internal Medicine

## 2023-03-01 ENCOUNTER — Encounter: Payer: Self-pay | Admitting: Nurse Practitioner

## 2023-03-01 DIAGNOSIS — E119 Type 2 diabetes mellitus without complications: Secondary | ICD-10-CM | POA: Diagnosis not present

## 2023-03-01 LAB — HM DIABETES EYE EXAM

## 2023-03-08 ENCOUNTER — Encounter: Payer: Self-pay | Admitting: Neurology

## 2023-03-08 ENCOUNTER — Ambulatory Visit: Payer: Medicare HMO | Admitting: Neurology

## 2023-03-08 VITALS — BP 112/68 | HR 86 | Ht 70.0 in | Wt 232.0 lb

## 2023-03-08 DIAGNOSIS — G894 Chronic pain syndrome: Secondary | ICD-10-CM

## 2023-03-08 DIAGNOSIS — G4731 Primary central sleep apnea: Secondary | ICD-10-CM

## 2023-03-08 DIAGNOSIS — Z79899 Other long term (current) drug therapy: Secondary | ICD-10-CM | POA: Diagnosis not present

## 2023-03-08 DIAGNOSIS — F4310 Post-traumatic stress disorder, unspecified: Secondary | ICD-10-CM | POA: Diagnosis not present

## 2023-03-08 DIAGNOSIS — G4739 Other sleep apnea: Secondary | ICD-10-CM

## 2023-03-08 MED ORDER — ALPRAZOLAM 0.5 MG PO TABS: ORAL_TABLET | ORAL | 0 refills | Status: DC

## 2023-03-08 MED ORDER — FLUTICASONE PROPIONATE 50 MCG/ACT NA SUSP: 2.0000 | Freq: Every day | NASAL | 3 refills | Status: AC | PRN
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
## 2023-03-08 NOTE — Patient Instructions (Signed)
ASSESSMENT AND PLAN 67 y.o. year old male  here with:  Complex and severe sleep apnea and hypoxia: Non compliance with CPAP,  likes to try least invasive mask, nasal mask.     1) non -compliance with CPAP and wanting to be re-tested and possible restarted on therapy. Alternative therapies such as Inspire or dental devices would not be indicated - if his current apnea is  as complex as it was 5 years ago.  Centrals, hyopxia , etc.  2) I will retest for current baseline, prefers HST .  I will ask him to use the device for at least 6 hours and we will meet for discussion of therapy   3) if there is hypoxia or complex apnea, we will need PAP, and I will offer an in lab titration to see if  BiPAP is best tolerated. Xanax 0.25 will be offered for in lab study.    Melvyn Novas, MD

## 2023-03-11 ENCOUNTER — Other Ambulatory Visit: Payer: Self-pay | Admitting: Cardiology

## 2023-03-11 DIAGNOSIS — I10 Essential (primary) hypertension: Secondary | ICD-10-CM

## 2023-03-15 ENCOUNTER — Other Ambulatory Visit: Payer: Self-pay | Admitting: Medical Genetics

## 2023-03-15 DIAGNOSIS — Z006 Encounter for examination for normal comparison and control in clinical research program: Secondary | ICD-10-CM

## 2023-04-09 ENCOUNTER — Telehealth: Payer: Self-pay | Admitting: Neurology

## 2023-04-09 NOTE — Telephone Encounter (Signed)
HST- [employer] no auth req   Patient is scheduled at [facility] For 04/25/23 at 1 pm.  Mailed packet to the patient.

## 2023-04-24 ENCOUNTER — Ambulatory Visit (INDEPENDENT_AMBULATORY_CARE_PROVIDER_SITE_OTHER): Payer: Medicare HMO | Admitting: Internal Medicine

## 2023-04-24 ENCOUNTER — Encounter: Payer: Self-pay | Admitting: Internal Medicine

## 2023-04-24 VITALS — BP 118/72 | HR 82 | Temp 97.7°F | Resp 16 | Ht 70.0 in | Wt 234.0 lb

## 2023-04-24 DIAGNOSIS — Z0000 Encounter for general adult medical examination without abnormal findings: Secondary | ICD-10-CM | POA: Diagnosis not present

## 2023-04-24 DIAGNOSIS — I10 Essential (primary) hypertension: Secondary | ICD-10-CM

## 2023-04-24 DIAGNOSIS — E119 Type 2 diabetes mellitus without complications: Secondary | ICD-10-CM | POA: Diagnosis not present

## 2023-04-24 DIAGNOSIS — Z0001 Encounter for general adult medical examination with abnormal findings: Secondary | ICD-10-CM

## 2023-04-24 NOTE — Patient Instructions (Signed)
Health Maintenance, Male Adopting a healthy lifestyle and getting preventive care are important in promoting health and wellness. Ask your health care provider about: The right schedule for you to have regular tests and exams. Things you can do on your own to prevent diseases and keep yourself healthy. What should I know about diet, weight, and exercise? Eat a healthy diet  Eat a diet that includes plenty of vegetables, fruits, low-fat dairy products, and lean protein. Do not eat a lot of foods that are high in solid fats, added sugars, or sodium. Maintain a healthy weight Body mass index (BMI) is a measurement that can be used to identify possible weight problems. It estimates body fat based on height and weight. Your health care provider can help determine your BMI and help you achieve or maintain a healthy weight. Get regular exercise Get regular exercise. This is one of the most important things you can do for your health. Most adults should: Exercise for at least 150 minutes each week. The exercise should increase your heart rate and make you sweat (moderate-intensity exercise). Do strengthening exercises at least twice a week. This is in addition to the moderate-intensity exercise. Spend less time sitting. Even light physical activity can be beneficial. Watch cholesterol and blood lipids Have your blood tested for lipids and cholesterol at 68 years of age, then have this test every 5 years. You may need to have your cholesterol levels checked more often if: Your lipid or cholesterol levels are high. You are older than 68 years of age. You are at high risk for heart disease. What should I know about cancer screening? Many types of cancers can be detected early and may often be prevented. Depending on your health history and family history, you may need to have cancer screening at various ages. This may include screening for: Colorectal cancer. Prostate cancer. Skin cancer. Lung  cancer. What should I know about heart disease, diabetes, and high blood pressure? Blood pressure and heart disease High blood pressure causes heart disease and increases the risk of stroke. This is more likely to develop in people who have high blood pressure readings or are overweight. Talk with your health care provider about your target blood pressure readings. Have your blood pressure checked: Every 3-5 years if you are 18-39 years of age. Every year if you are 40 years old or older. If you are between the ages of 65 and 75 and are a current or former smoker, ask your health care provider if you should have a one-time screening for abdominal aortic aneurysm (AAA). Diabetes Have regular diabetes screenings. This checks your fasting blood sugar level. Have the screening done: Once every three years after age 45 if you are at a normal weight and have a low risk for diabetes. More often and at a younger age if you are overweight or have a high risk for diabetes. What should I know about preventing infection? Hepatitis B If you have a higher risk for hepatitis B, you should be screened for this virus. Talk with your health care provider to find out if you are at risk for hepatitis B infection. Hepatitis C Blood testing is recommended for: Everyone born from 1945 through 1965. Anyone with known risk factors for hepatitis C. Sexually transmitted infections (STIs) You should be screened each year for STIs, including gonorrhea and chlamydia, if: You are sexually active and are younger than 68 years of age. You are older than 68 years of age and your   health care provider tells you that you are at risk for this type of infection. Your sexual activity has changed since you were last screened, and you are at increased risk for chlamydia or gonorrhea. Ask your health care provider if you are at risk. Ask your health care provider about whether you are at high risk for HIV. Your health care provider  may recommend a prescription medicine to help prevent HIV infection. If you choose to take medicine to prevent HIV, you should first get tested for HIV. You should then be tested every 3 months for as long as you are taking the medicine. Follow these instructions at home: Alcohol use Do not drink alcohol if your health care provider tells you not to drink. If you drink alcohol: Limit how much you have to 0-2 drinks a day. Know how much alcohol is in your drink. In the [place] one drink equals one 12 oz bottle of beer (355 mL), one 5 oz glass of wine (148 mL), or one 1 oz glass of hard liquor (44 mL). Lifestyle Do not use any products that contain nicotine or tobacco. These products include cigarettes, chewing tobacco, and vaping devices, such as e-cigarettes. If you need help quitting, ask your health care provider. Do not use street drugs. Do not share needles. Ask your health care provider for help if you need support or information about quitting drugs. General instructions Schedule regular health, dental, and eye exams. Stay current with your vaccines. Tell your health care provider if: You often feel depressed. You have ever been abused or do not feel safe at home. Summary Adopting a healthy lifestyle and getting preventive care are important in promoting health and wellness. Follow your health care provider's instructions about healthy diet, exercising, and getting tested or screened for diseases. Follow your health care provider's instructions on monitoring your cholesterol and blood pressure. This information is not intended to replace advice given to you by your health care provider. Make sure you discuss any questions you have with your health care provider. Document Revised: 10/11/2020 Document Reviewed: 10/11/2020 Elsevier Patient Education  2024 [employer]  

## 2023-04-24 NOTE — Progress Notes (Unsigned)
Subjective:  Patient ID: Donald [name], male    DOB: 1956-01-13  Age: 67 y.o. MRN: [id number]  CC: Annual Exam, Hypertension, Diabetes, and Hyperlipidemia   HPI Donald [name] presents for a CPX and f/up ----  Discussed the use of AI scribe software for clinical note transcription with the patient, who gave verbal consent to proceed.  History of Present Illness   The patient, with a history of hypertension, neurogenic bladder, and obesity, has been struggling with weight gain despite taking Mounjaro and engaging in regular exercise, walking one to two miles every other day. He denies experiencing chest pain, shortness of breath, dizziness, or lightheadedness during these walks. The patient admits to consuming pastries after dinner, which he believes is contributing to his weight gain.  The patient also reports a low blood pressure reading of 100/68, which he attributes to his current medication regimen. He has stopped taking losartan, but continues to take hydrochlorothiazide. He denies experiencing dizziness, lightheadedness, or leg swelling.  Regarding his neurogenic bladder, the patient reports difficulty discerning any changes in urinary frequency due to this condition. He expresses a need to follow up with his urologist.  The patient also mentions participation in a genetic research project, [employer], for cancer screening but has not received further instructions since signing up.  Lastly, the patient is on Suboxone for pain management. He has recently received a COVID booster shot but has not had a flu shot or shingles vaccine. He has not had his A1c checked since June.       Outpatient Medications Prior to Visit  Medication Sig Dispense Refill   ALPRAZolam (XANAX) 0.5 MG tablet for anxiety with CPAP use, take 1/2 tab at bedtime po for the first 14 days. 30 tablet 0   b complex-vitamin c-folic acid (NEPHRO-VITE) 0.8 MG TABS tablet Take by mouth.     buprenorphine-naloxone  (SUBOXONE) 8-2 mg SUBL SL tablet Place 1 tablet under the tongue daily.     Cholecalciferol (VITAMIN D3) 125 MCG (5000 UT) CAPS Take 5,000 Units by mouth in the morning and at bedtime.     Cyanocobalamin (B-12 PO) Take 4 drops by mouth daily.     fluticasone (FLONASE) 50 MCG/ACT nasal spray Place 2 sprays into both nostrils daily as needed for allergies or rhinitis. 16 each 3   Folic Acid-Vit B6-Vit B12 0.5-5-0.2 MG TABS Take 1 tablet by mouth daily. B6 Complex     Multiple Vitamin (MULTI-VITAMIN) tablet Take 1 tablet by mouth daily.     NON FORMULARY Take 5 drops by mouth daily. Vitamin A,D,K daily     Omega 3-6-9 Fatty Acids (OMEGA-3-6-9 PO) Take 15 mLs by mouth daily. 15.9 g     omeprazole (PRILOSEC) 20 MG capsule TAKE 1 CAPSULE BY MOUTH ONCE DAILY AS NEEDED FOR  ACID  REFLUX 90 capsule 0   tamsulosin (FLOMAX) 0.4 MG CAPS capsule Take 1 capsule by mouth once daily 90 capsule 0   tirzepatide (MOUNJARO) 7.5 MG/0.5ML Pen Inject 7.5 mg into the skin once a week. 6 mL 1   hydrochlorothiazide (HYDRODIURIL) 25 MG tablet TAKE 1 TABLET BY MOUTH ONCE DAILY IN THE MORNING 30 tablet 5   loratadine (CLARITIN) 10 MG tablet Take by mouth.     meloxicam (MOBIC) 15 MG tablet Take 1 tablet by mouth once daily 90 tablet 0   losartan (COZAAR) 25 MG tablet Take 25 mg by mouth daily.     rosuvastatin (CRESTOR) 10 MG tablet Take 1 tablet  by mouth once daily 90 tablet 0   No facility-administered medications prior to visit.    ROS Review of Systems  Objective:  BP 118/72 (BP Location: Left Arm, Patient Position: Sitting, Cuff Size: Normal)   Pulse 82   Temp 97.7 F (36.5 C) (Oral)   Resp 16   Ht 5\' 10"  (1.778 m)   Wt 234 lb (106.1 kg)   SpO2 97%   BMI 33.58 kg/m   BP Readings from Last 3 Encounters:  04/24/23 118/72  03/08/23 112/68  01/23/23 102/62    Wt Readings from Last 3 Encounters:  04/24/23 234 lb (106.1 kg)  03/08/23 232 lb (105.2 kg)  01/23/23 226 lb (102.5 kg)    Physical  Exam  Lab Results  Component Value Date   WBC 8.1 11/30/2022   HGB 13.4 11/30/2022   HCT 41.7 11/30/2022   PLT 187.0 11/30/2022   GLUCOSE 97 01/23/2023   CHOL 129 11/30/2022   TRIG 96.0 11/30/2022   HDL 57.60 11/30/2022   LDLCALC 52 11/30/2022   ALT 17 01/23/2023   AST 17 01/23/2023   NA 140 01/23/2023   K 3.6 01/23/2023   CL 98 01/23/2023   CREATININE 0.85 01/23/2023   BUN 21 01/23/2023   CO2 36 (H) 01/23/2023   TSH 0.92 01/23/2023   PSA 0.92 01/23/2023   INR 1.1 (H) 01/23/2023   HGBA1C 5.9 11/30/2022   MICROALBUR <0.7 11/30/2022    DG ESOPHAGUS W DOUBLE CM (HD)  Result Date: 02/23/2023 CLINICAL DATA:  Dysphagia, specifically with pills. EXAM: ESOPHOGRAM / BARIUM SWALLOW / BARIUM TABLET STUDY TECHNIQUE: Combined double contrast and single contrast examination performed using effervescent crystals, thick barium liquid, and thin barium liquid. The patient was observed with fluoroscopy swallowing a 13 mm barium sulphate tablet. FLUOROSCOPY: Radiation Exposure Index (as provided by the fluoroscopic device): 21.9 mGy reference air Kerma. Fluoroscopy time: 1 minute, 46 seconds. COMPARISON:  Chest radiograph 07/12/2010. FINDINGS: Swallowing function: Silent laryngeal penetration with thin barium, clearing with cough. No aspiration. Bulky anterior osteophytes from C2-C5 results in mild mass effect on the oropharynx and hypopharynx. Esophagus: Normal appearance. Esophageal motility: Moderate hang up of thick barium at the lower esophageal sphincter. Gastroesophageal reflux: None visualized. Negative water siphon test. Ingested 13mm barium tablet: Passed normally. Other:  None. IMPRESSION: 1. Silent laryngeal penetration with thin barium, clearing with cough. No aspiration. 2. Bulky anterior osteophytes from C2-C5 results in mild mass effect on the oropharynx and hypopharynx. 3. Moderate hang up of thick barium at the lower esophageal sphincter, possibly indicating LES dysfunction.  Electronically Signed   By: Orvan Falconer M.D.   On: 02/23/2023 14:36    Assessment & Plan:  Type 2 diabetes mellitus without complication, without long-term current use of insulin (HCC) -     Basic metabolic panel; Future -     Hemoglobin A1c; Future  Primary hypertension -     Basic metabolic panel; Future -     CBC with Differential/Platelet; Future  Encounter for general adult medical examination with abnormal findings     Follow-up: Return in about 6 months (around 10/22/2023).  Sanda Linger, MD

## 2023-04-25 ENCOUNTER — Ambulatory Visit (INDEPENDENT_AMBULATORY_CARE_PROVIDER_SITE_OTHER): Payer: Medicare HMO | Admitting: Neurology

## 2023-04-25 DIAGNOSIS — G894 Chronic pain syndrome: Secondary | ICD-10-CM

## 2023-04-25 DIAGNOSIS — G4733 Obstructive sleep apnea (adult) (pediatric): Secondary | ICD-10-CM

## 2023-04-25 DIAGNOSIS — G4731 Primary central sleep apnea: Secondary | ICD-10-CM

## 2023-04-25 DIAGNOSIS — G4739 Other sleep apnea: Secondary | ICD-10-CM

## 2023-04-25 DIAGNOSIS — F4310 Post-traumatic stress disorder, unspecified: Secondary | ICD-10-CM

## 2023-04-25 LAB — BASIC METABOLIC PANEL
BUN: 22 mg/dL (ref 6–23)
CO2: 33 meq/L — ABNORMAL HIGH (ref 19–32)
Calcium: 9.5 mg/dL (ref 8.4–10.5)
Chloride: 100 meq/L (ref 96–112)
Creatinine, Ser: 0.94 mg/dL (ref 0.40–1.50)
GFR: 83.23 mL/min (ref >60.00)
Glucose, Bld: 95 mg/dL (ref 70–99)
Potassium: 3.8 meq/L (ref 3.5–5.1)
Sodium: 141 meq/L (ref 135–145)

## 2023-04-25 LAB — CBC WITH DIFFERENTIAL/PLATELET
Basophils Absolute: 0.1 10*3/uL (ref 0.0–0.1)
Basophils Relative: 0.7 % (ref 0.0–3.0)
Eosinophils Absolute: 0 10*3/uL (ref 0.0–0.7)
Eosinophils Relative: 0.5 % (ref 0.0–5.0)
HCT: 42.2 % (ref 39.0–52.0)
Hemoglobin: 13.6 g/dL (ref 13.0–17.0)
Lymphocytes Relative: 29.7 % (ref 12.0–46.0)
Lymphs Abs: 2.5 10*3/uL (ref 0.7–4.0)
MCHC: 32.3 g/dL (ref 30.0–36.0)
MCV: 91.2 fL (ref 78.0–100.0)
Monocytes Absolute: 0.6 10*3/uL (ref 0.1–1.0)
Monocytes Relative: 7.4 % (ref 3.0–12.0)
Neutro Abs: 5.3 10*3/uL (ref 1.4–7.7)
Neutrophils Relative %: 61.7 % (ref 43.0–77.0)
Platelets: 192 10*3/uL (ref 150.0–400.0)
RBC: 4.63 Mil/uL (ref 4.22–5.81)
RDW: 13.2 % (ref 11.5–15.5)
WBC: 8.6 10*3/uL (ref 4.0–10.5)

## 2023-04-25 LAB — HEMOGLOBIN A1C: Hgb A1c MFr Bld: 6.1 % (ref 4.6–6.5)

## 2023-04-26 NOTE — Progress Notes (Signed)
[facility]  Donald [name] 67 year old male Aug 24, 1955   HOME SLEEP TEST REPORT ( by Watch PAT)   STUDY DATE:  04-26-2023 ORDERING CLINICIAN: Melvyn Novas, MD  Primary Neurologist: Joycelyn Schmid, MD  REFERRING CLINICIANTessa Lerner , DO and PCP DR. Polite, MD    CLINICAL INFORMATION/HISTORY: Complex Sleep Apnea , new referral for sleep consult on 03-08-2023:PCP Dr. Darrol Jump for an evlauation of CO2 retention.CPAP non -compliant., has not used PAP consistently over a year now. He does report sleep choking episodes. He is snoring.  Chief concern according to patient :  "new apnea evaluation needed". " I don't now why I stopped routine CPAP use ". Nasal mask user.   I have the pleasure of seeing Donald [name] 03/08/23 for his currently untreated sleep disorder.  Last sleep medicine visit in our clinic was based on Dr. Richrd Humbles referral: The patient had already been diagnosed with sleep apnea before,  had received a CPAP device,  but had trouble being compliant with CPAP therapy due to ear nose and throat problems.  He was first diagnosed in July 2017 with a rather complex and central form of sleep apnea at an AHI of 42/h. Complicating factor: There was also prolonged hypoxemia.  He was titrated in a split-night polysomnography to 8 cm water CPAP and his residual apnea was 2.6 /h and he had excellent compliance through the year 2018 at 97%.   He then developed louder and louder tinnitus, hearing difficulties and sinus pressure in the right ear and under the right face.  This interfered with sleeping on the right side.  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 Jearld Fenton has attended to his ENT needs.  The CPAP compliance at the time dropped due to this development and he also had more frequent concerns of contracting bronchitis, sinusitis and headaches.   CPAP range was set  between 5 and 15 cm water pressure with 3 cm EPR . When he received a new machine in 2020 the 95th percentile pressure at the time was 12 cm water and the residual AHI is 3.1 still excellent.   The last sleep study for this patient was performed as a home sleep test on an apnea link dated 10-01-2017.  The patient used an AirFit P10 nasal pillow in medium size at the time.   His BMI was 35, Epworth score 15 fatigue severity score 29 points.   The AHI of the home sleep test was 53/h and he had 92 minutes of oxygen desaturation with a nadir at 71% (!).     Epworth sleepiness score: 4/ 24 points   FSS endorsed at 28/ 63 points.    BMI: 33.3 kg/m   Neck Circumference: 18.5"   FINDINGS:   Sleep Summary:   Total Recording Time (hours, min):   8 hours 45 minutes  Total Sleep Time (hours, min):     8 hours 8 minutes            Percent REM (%):   25%     There was a sleep latency of only 6 minutes and the REM sleep latency of 77 minutes with 31 minutes of wakefulness after sleep onset time.  Respiratory Indices are reflecting AASM standards.  Results based on CMS scoring are displayed in (secondary position)   Calculated pAHI (per hour):     62/h   (46/h)                    REM pAHI:     71/h    (61/h)                                        NREM pAHI:   59/h   ( 40/h)                       Positional AHI:   The patient slept for the majority of the night in supine position with an AHI of 63/h that 112 minutes recorded on his left side with an AHI of 57.5/h. By CMS criteria the patient reached in supine sleep an AHI of 46 /h and in the left lateral sleep position an AHI of 40/h.                                                Oxygen Saturation Statistics:   Oxygen Saturation (%) Mean: 92%               O2 Saturation Range (%): Between a nadir at 79% with a maximum saturation of 99%                                     O2 Saturation (minutes) <89%:     10.8  minutes, equivalent to 2.2% of total sleep time.      Pulse Rate Statistics:   Pulse Mean (bpm): 67 bpm               Pulse Range: Between 48 and 123 bpm, this is borderline bradycardia tachycardia. (PS :the home sleep test device does not gather data indicating the cardiac rhythm)               IMPRESSION:  This HST confirms the presence of severe sleep apnea, interestingly this device did not calculate any central apneas to be present, and showed only minor hypoxia. Dx is severe OSA.  There was also no clear REM sleep dependency or positional dependency.   RECOMMENDATION: I would strongly recommend to resume CPAP therapy with an auto titration device that we can set openly between 5 and 20 cm water pressure to allow the patient to determine his 95th percentile need for positive airway pressure. The patient should be able to choose a interface of his comfort has used a nasal pillow in the past which may no longer be the most comfortable option for him.  Avoiding sleep on his back will help to reduce the apnea somewhat, and apnea reduction can also be achieved by further weight loss.    INTERPRETING PHYSICIAN:   Melvyn Novas, MD  [facility] Sleep Board certified by [employer] of Sleep Medicine and Diplomate of the [employer] of Sleep Medicine. Board certified In Neurology through the ABPN, Fellow of the [employer] of Neurology.

## 2023-04-27 ENCOUNTER — Encounter (HOSPITAL_COMMUNITY): Payer: Self-pay

## 2023-04-27 ENCOUNTER — Emergency Department (HOSPITAL_COMMUNITY)
Admission: EM | Admit: 2023-04-27 | Discharge: 2023-04-28 | Disposition: A | Discharge status: Home / Self Care | Payer: Medicare HMO | Attending: Emergency Medicine | Admitting: Emergency Medicine

## 2023-04-27 ENCOUNTER — Emergency Department (HOSPITAL_COMMUNITY): Payer: Medicare HMO

## 2023-04-27 DIAGNOSIS — I10 Essential (primary) hypertension: Secondary | ICD-10-CM | POA: Diagnosis not present

## 2023-04-27 DIAGNOSIS — Z043 Encounter for examination and observation following other accident: Secondary | ICD-10-CM | POA: Diagnosis not present

## 2023-04-27 DIAGNOSIS — S8991XA Unspecified injury of right lower leg, initial encounter: Secondary | ICD-10-CM | POA: Diagnosis not present

## 2023-04-27 DIAGNOSIS — M1712 Unilateral primary osteoarthritis, left knee: Secondary | ICD-10-CM | POA: Diagnosis not present

## 2023-04-27 DIAGNOSIS — S81819A Laceration without foreign body, unspecified lower leg, initial encounter: Secondary | ICD-10-CM

## 2023-04-27 DIAGNOSIS — Z91040 Latex allergy status: Secondary | ICD-10-CM | POA: Insufficient documentation

## 2023-04-27 DIAGNOSIS — S81811A Laceration without foreign body, right lower leg, initial encounter: Secondary | ICD-10-CM | POA: Insufficient documentation

## 2023-04-27 DIAGNOSIS — W172XXA Fall into hole, initial encounter: Secondary | ICD-10-CM | POA: Insufficient documentation

## 2023-04-27 DIAGNOSIS — S80212A Abrasion, left knee, initial encounter: Secondary | ICD-10-CM | POA: Insufficient documentation

## 2023-04-27 DIAGNOSIS — E119 Type 2 diabetes mellitus without complications: Secondary | ICD-10-CM | POA: Insufficient documentation

## 2023-04-27 DIAGNOSIS — Z23 Encounter for immunization: Secondary | ICD-10-CM | POA: Diagnosis not present

## 2023-04-27 DIAGNOSIS — W19XXXA Unspecified fall, initial encounter: Secondary | ICD-10-CM

## 2023-04-27 LAB — CBC
HCT: 41.1 % (ref 39.0–52.0)
Hemoglobin: 13.1 g/dL (ref 13.0–17.0)
MCH: 28.7 pg (ref 26.0–34.0)
MCHC: 31.9 g/dL (ref 30.0–36.0)
MCV: 89.9 fL (ref 80.0–100.0)
Platelets: 199 10*3/uL (ref 150–400)
RBC: 4.57 MIL/uL (ref 4.22–5.81)
RDW: 12.5 % (ref 11.5–15.5)
WBC: 7.4 10*3/uL (ref 4.0–10.5)
nRBC: 0 % (ref 0.0–0.2)

## 2023-04-27 LAB — BASIC METABOLIC PANEL
Anion gap: 9 (ref 5–15)
BUN: 20 mg/dL (ref 8–23)
CO2: 31 mmol/L (ref 22–32)
Calcium: 9 mg/dL (ref 8.9–10.3)
Chloride: 100 mmol/L (ref 98–111)
Creatinine, Ser: 0.97 mg/dL (ref 0.61–1.24)
GFR, Estimated: >60 mL/min (ref >60)
Glucose, Bld: 90 mg/dL (ref 70–99)
Potassium: 3.6 mmol/L (ref 3.5–5.1)
Sodium: 140 mmol/L (ref 135–145)

## 2023-04-27 IMAGING — DX DG FOOT COMPLETE 3+V*R*
3 series · 3 of 3 positions shown · non-contrast
Comparison: None.

CLINICAL DATA: Right distal foot pain, swelling. Worse at 1st MTP.
No known injury.

EXAM:
RIGHT FOOT COMPLETE - 3+ VIEW

[foot ap]
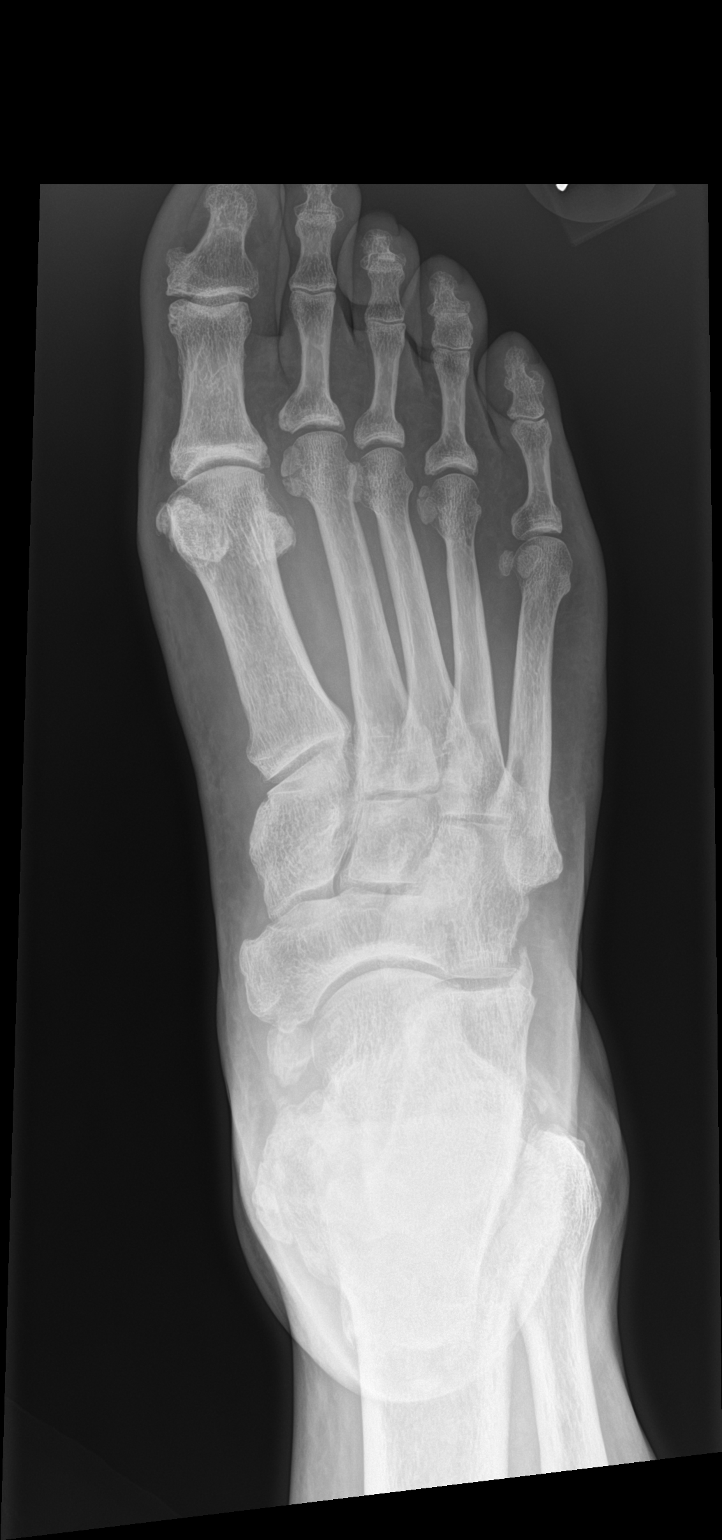

[foot obl]
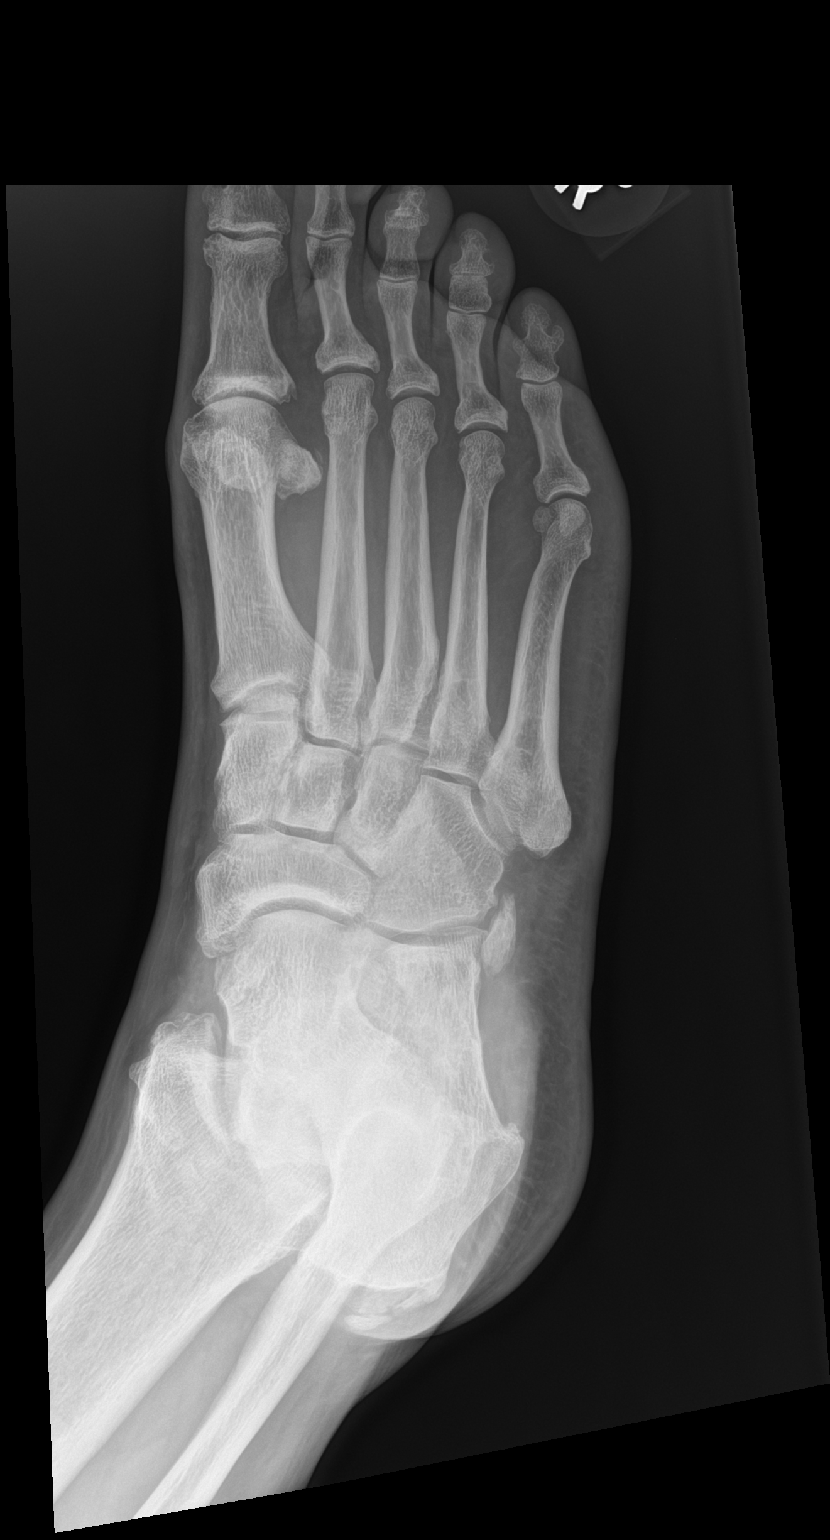

[foot lat]
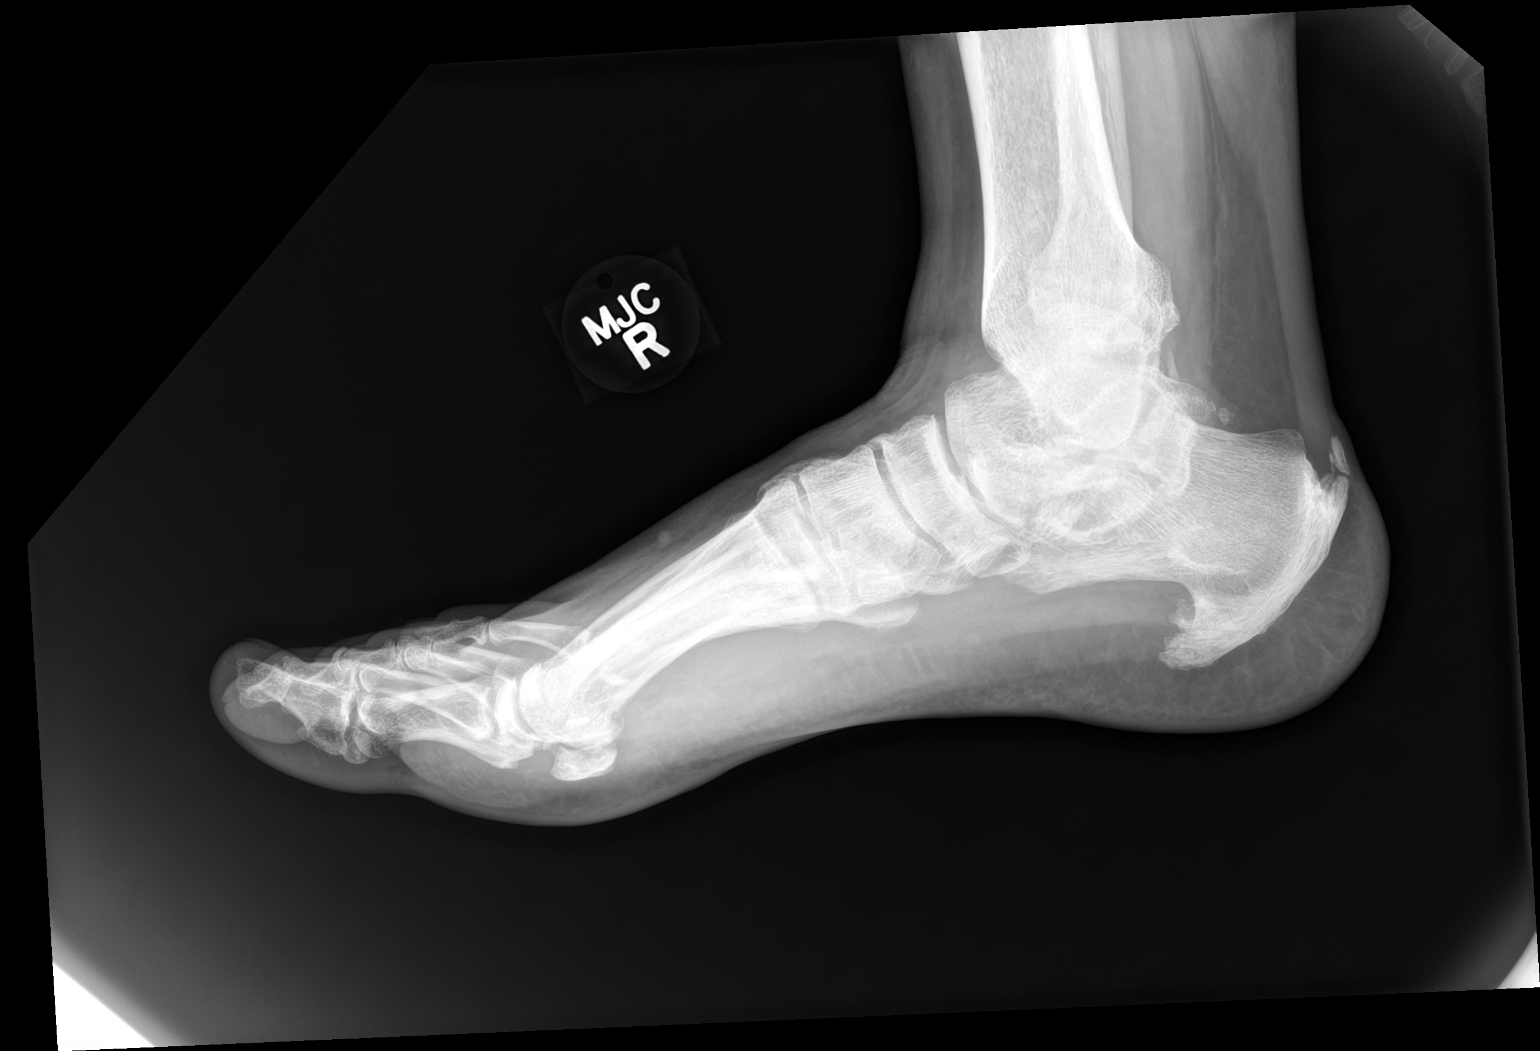

[3 of 3 positions shown; findings below may reference images not displayed]

FINDINGS: Mild 1st MT P joint space narrowing and spur formation. Mild spur
formation at the 1st IP joint. Accessory ossicle adjacent to the
proximal cuboid. Mild dorsal tarsal spur formation. Large, thick and
irregular inferior calcaneal enthesophyte. Moderately large,
fragmented posterior calcaneal enthesophyte. No bone fractures or
dislocations. No bone erosions or soft tissue calcifications.
IMPRESSION: Degenerative changes, as described above.

## 2023-04-27 MED ORDER — ACETAMINOPHEN 500 MG PO TABS
1000.0000 mg | ORAL_TABLET | Freq: Once | ORAL | Status: AC
  Administered 2023-04-28: 1000 mg via ORAL
  Filled 2023-04-27: qty 2

## 2023-04-27 NOTE — ED Triage Notes (Signed)
Pt was on his nightly walk and was walking near a construction site when he fell into a whole that was about chest deep. He come in with injury to his right lower leg with a signification abrasion on the right shin and some minor abrasion to the left knee. He also says he has bilateral ankle pain. He did not hit his head and had no LOC.

## 2023-04-27 NOTE — ED Provider Triage Note (Signed)
Emergency Medicine Provider Triage Evaluation Note  Donald [name] , a 67 y.o. male  was evaluated in triage.  Pt complains of fall.  Was walking down the sidewalk and looked at a construction site when he fell and in a hole accidently.  Says the hole was approximately "chest deep".  Did not hit his head or lose consciousness.  Now with a large abrasion on the right shin left knee pain and bilateral ankle pain.  Review of Systems  Positive: See above Negative: See above  Physical Exam  There were no vitals taken for this visit. Gen:   Awake, no distress   Resp:  Normal effort  MSK:   Moves extremities without difficulty  Other:    Medical Decision Making  Medically screening exam initiated at 9:33 PM.  Appropriate orders placed.  BENOIT POCIASK was informed that the remainder of the evaluation will be completed by another provider, this initial triage assessment does not replace that evaluation, and the importance of remaining in the ED until their evaluation is complete.  Work up started   Gareth Eagle, [place] 04/27/23 2135

## 2023-04-28 MED ORDER — TETANUS-DIPHTH-ACELL PERTUSSIS 5-2.5-18.5 LF-MCG/0.5 IM SUSY
0.5000 mL | PREFILLED_SYRINGE | Freq: Once | INTRAMUSCULAR | Status: AC
  Administered 2023-04-28: 0.5 mL via INTRAMUSCULAR
  Filled 2023-04-28: qty 0.5

## 2023-04-28 MED ORDER — LIDOCAINE-EPINEPHRINE (PF) 2 %-1:200000 IJ SOLN
10.0000 mL | Freq: Once | INTRAMUSCULAR | Status: AC
  Administered 2023-04-28: 10 mL
  Filled 2023-04-28: qty 20

## 2023-04-28 NOTE — ED Notes (Signed)
Wound on Right shin irrigated and cleansed.

## 2023-04-28 NOTE — Discharge Instructions (Addendum)
You were seen tonight for evaluation after a fall.  Your x-rays were reassuring with no fractures or dislocations.  Your wound was repaired with 2 sutures.  These should be removed in approximately 10 days.  They can be removed by your primary care provider, urgent care, or the emergency department.

## 2023-04-28 NOTE — ED Provider Notes (Signed)
  Physical Exam  BP 118/72   Pulse 74   Temp 97.9 F (36.6 C)   Resp 18   SpO2 100%   Physical Exam  Procedures  .Marland KitchenLaceration Repair  Date/Time: 04/28/2023 4:02 AM  Performed by: Lunette Stands, PA-C Authorized by: Lunette Stands, PA-C   Consent:    Consent obtained:  Verbal   Consent given by:  Patient   Risks, benefits, and alternatives were discussed: yes     Risks discussed:  Infection, poor cosmetic result and poor wound healing   Alternatives discussed:  No treatment Universal protocol:    Procedure explained and questions answered to patient or proxy's satisfaction: yes     Patient identity confirmed:  Verbally with patient and arm band Anesthesia:    Anesthesia method:  Local infiltration   Local anesthetic:  Lidocaine 1% WITH epi Laceration details:    Location:  Leg   Leg location:  R lower leg   Length (cm):  1.5 Pre-procedure details:    Preparation:  Imaging obtained to evaluate for foreign bodies Exploration:    Hemostasis achieved with:  Direct pressure   Imaging obtained: x-ray     Imaging outcome: foreign body not noted     Wound exploration: entire depth of wound visualized     Wound extent: no foreign body     Contaminated: no   Treatment:    Area cleansed with:  Povidone-iodine   Amount of cleaning:  Standard   Irrigation solution:  Sterile saline   Irrigation volume:  50 mL   Irrigation method:  Syringe Skin repair:    Repair method:  Sutures   Suture size:  4-0   Suture material:  Prolene   Suture technique:  Simple interrupted   Number of sutures:  2 Approximation:    Approximation:  Close Repair type:    Repair type:  Simple Post-procedure details:    Dressing:  Sterile dressing and non-adherent dressing   Procedure completion:  Tolerated well, no immediate complications   ED Course / MDM    Medical Decision Making Risk Prescription drug management.          Lunette Stands, [place] 04/28/23 0406    Maia Plan,  MD 04/29/23 [phone]

## 2023-04-28 NOTE — ED Provider Notes (Signed)
[place] EMERGENCY DEPARTMENT AT [facility] Provider Note   CSN: [id number] Arrival date & time: 04/27/23  2114     History  Chief Complaint  Patient presents with   Leg Injury    Donald [name] is a 67 y.o. male. Patient presents to the emergency department complaining of pain to the right shin and left knee secondary to a fall.  Patient was walking near a construction site when he fell into a hole which was reported to be chest deep.  He denies difficulty with ambulation at this time.  He did not hit his head and had no loss of consciousness.  Patient is not on blood thinners.  Past medical history significant for GERD, depression, chronic back pain, hepatitis C, hypertension, type II DM  HPI     Home Medications Prior to Admission medications   Medication Sig Start Date End Date Taking? Authorizing Provider  ALPRAZolam Prudy Feeler) 0.5 MG tablet for anxiety with CPAP use, take 1/2 tab at bedtime po for the first 14 days. 03/08/23   Dohmeier, Porfirio Mylar, MD  b complex-vitamin c-folic acid (NEPHRO-VITE) 0.8 MG TABS tablet Take by mouth. 10/14/20   [provider]  buprenorphine-naloxone (SUBOXONE) 8-2 mg SUBL SL tablet Place 1 tablet under the tongue daily.    [provider]  Cholecalciferol (VITAMIN D3) 125 MCG (5000 UT) CAPS Take 5,000 Units by mouth in the morning and at bedtime.    [provider]  Cyanocobalamin (B-12 PO) Take 4 drops by mouth daily.    [provider]  fluticasone (FLONASE) 50 MCG/ACT nasal spray Place 2 sprays into both nostrils daily as needed for allergies or rhinitis. 03/08/23   Dohmeier, Porfirio Mylar, MD  Folic Acid-Vit B6-Vit B12 0.5-5-0.2 MG TABS Take 1 tablet by mouth daily. B6 Complex    [provider]  Multiple Vitamin (MULTI-VITAMIN) tablet Take 1 tablet by mouth daily. 09/27/15   [provider]  NON FORMULARY Take 5 drops by mouth daily. Vitamin A,D,K daily    [provider]  Omega 3-6-9  Fatty Acids (OMEGA-3-6-9 PO) Take 15 mLs by mouth daily. 15.9 g    [provider]  omeprazole (PRILOSEC) 20 MG capsule TAKE 1 CAPSULE BY MOUTH ONCE DAILY AS NEEDED FOR  ACID  REFLUX 02/15/23   Etta Grandchild, MD  tamsulosin [facility]) 0.4 MG CAPS capsule Take 1 capsule by mouth once daily 02/27/23   Etta Grandchild, MD  tirzepatide [facility]) 7.5 MG/0.5ML Pen Inject 7.5 mg into the skin once a week. 01/23/23   Etta Grandchild, MD      Allergies    Diclofenac, Fentanyl, Duloxetine, Adhesive [tape], and Latex    Review of Systems   Review of Systems  Physical Exam Updated Vital Signs BP 118/72   Pulse 74   Temp 97.9 F (36.6 C)   Resp 18   SpO2 100%  Physical Exam Vitals and nursing note reviewed.  HENT:     Head: Normocephalic and atraumatic.  Eyes:     Pupils: Pupils are equal, round, and reactive to light.  Pulmonary:     Effort: Pulmonary effort is normal. No respiratory distress.  Musculoskeletal:        General: No swelling, deformity or signs of injury. Normal range of motion.     Cervical back: Normal range of motion.  Skin:    General: Skin is dry.     Comments: Skin avulsion noted to anterior right shin.  Laceration noted just  below avulsion.  Small abrasion noted to left knee.  Neurological:     Mental Status: He is alert.  Psychiatric:        Speech: Speech normal.        Behavior: Behavior normal.     ED Results / Procedures / Treatments   Labs (all labs ordered are listed, but only abnormal results are displayed) Labs Reviewed  BASIC METABOLIC PANEL  CBC    EKG None  Radiology DG Tibia/Fibula Right  Result Date: 04/27/2023 CLINICAL DATA:  Tripped and fell in hole EXAM: RIGHT TIBIA AND FIBULA - 2 VIEW COMPARISON:  None Available. FINDINGS: There is no evidence of fracture or other focal bone lesions. Soft tissues are unremarkable. IMPRESSION: Negative. Electronically Signed   By: Jasmine Pang M.D.   On: 04/27/2023 22:31   DG Knee Complete 4  Views Left  Result Date: 04/27/2023 CLINICAL DATA:  Fall EXAM: LEFT KNEE - COMPLETE 4+ VIEW COMPARISON:  09/29/2021 FINDINGS: No fracture or malalignment. Moderate tricompartment arthritis of the knee. No significant knee effusion IMPRESSION: No acute osseous abnormality. Electronically Signed   By: Jasmine Pang M.D.   On: 04/27/2023 22:30   DG Ankle Complete Left  Result Date: 04/27/2023 CLINICAL DATA:  Fall EXAM: LEFT ANKLE COMPLETE - 3+ VIEW COMPARISON:  None Available. FINDINGS: No acute displaced fracture or malalignment. Ankle mortise is grossly symmetric. IMPRESSION: No acute osseous abnormality Electronically Signed   By: Jasmine Pang M.D.   On: 04/27/2023 22:28   DG Ankle Complete Right  Result Date: 04/27/2023 CLINICAL DATA:  Fall EXAM: RIGHT ANKLE - COMPLETE 3+ VIEW COMPARISON:  None Available. FINDINGS: No acute fracture or malalignment. Ankle mortise is symmetric. Ossicles or old injuries adjacent to the medial and lateral malleoli. IMPRESSION: No acute osseous abnormality. Electronically Signed   By: Jasmine Pang M.D.   On: 04/27/2023 22:27    Procedures Procedures    Medications Ordered in ED Medications  acetaminophen (TYLENOL) tablet 1,000 mg (1,000 mg Oral Given 04/28/23 0256)  lidocaine-EPINEPHrine (XYLOCAINE W/EPI) 2 %-1:200000 (PF) injection 10 mL (10 mLs Infiltration Given 04/28/23 0344)  Tdap (BOOSTRIX) injection 0.5 mL (0.5 mLs Intramuscular Given 04/28/23 0341)    ED Course/ Medical Decision Making/ A&P                                 Medical Decision Making  This patient presents to the ED for concern of bilateral leg pain, this involves an extensive number of treatment options, and is a complaint that carries with it a high risk of complications and morbidity.  The differential diagnosis includes fracture, dislocation, soft tissue injury, others   Co morbidities that complicate the patient evaluation  Type II DM   Imaging Studies ordered:  I  ordered imaging studies including plain films of the right tibia-fibula, left knee, bilateral ankles I independently visualized and interpreted imaging which showed no fracture or dislocation I agree with the radiologist interpretation   Problem List / ED Course / Critical interventions / Medication management   I ordered medication including Tylenol for pain, Tdap for tetanus coverage Reevaluation of the patient after these medicines showed that the patient improved I have reviewed the patients home medicines and have made adjustments as needed   Test / Admission - Considered:  Patient with no fracture or dislocation noted on imaging.  He does have soft tissue injuries.  Skin avulsion on right shin  which is not amenable to repair.  Bandaged with sterile dressing.  There was a small laceration which was repaired.  See separate procedure note.  Left knee abrasion with no need for repair.  Plan to discharge home at this time.  Patient will need suture removal in approximately 10 days.         Final Clinical Impression(s) / ED Diagnoses Final diagnoses:  Fall, initial encounter  Laceration of shin    Rx / DC Orders ED Discharge Orders     None         Pamala Duffel 04/28/23 0981    Maia Plan, MD 04/29/23 0045

## 2023-04-30 ENCOUNTER — Encounter: Payer: Self-pay | Admitting: Family Medicine

## 2023-04-30 ENCOUNTER — Ambulatory Visit (INDEPENDENT_AMBULATORY_CARE_PROVIDER_SITE_OTHER): Payer: Medicare HMO | Admitting: Family Medicine

## 2023-04-30 VITALS — BP 118/72 | HR 80 | Temp 97.6°F | Ht 70.0 in | Wt 232.0 lb

## 2023-04-30 DIAGNOSIS — E1159 Type 2 diabetes mellitus with other circulatory complications: Secondary | ICD-10-CM

## 2023-04-30 DIAGNOSIS — L03115 Cellulitis of right lower limb: Secondary | ICD-10-CM | POA: Diagnosis not present

## 2023-04-30 MED ORDER — DOXYCYCLINE HYCLATE 100 MG PO CAPS: 100.0000 mg | ORAL_CAPSULE | Freq: Two times a day (BID) | ORAL | 0 refills | Status: DC

## 2023-04-30 NOTE — Progress Notes (Signed)
   Acute Office Visit  Subjective:     Patient ID: Donald [name], male    DOB: [birth year], 67 y.o.   MRN: [id number]  Chief Complaint  Patient presents with   Wound Check    Donald [name] to ER on Friday for wound, was walking after work Friday and fell in a hole. ER wanted him to f/u for stiches removal in 10 days but it started to bleed yesterday and pt was concerned.     Wound Check   Patient is in today for recheck of wound to the right lower leg. He was seen in the ER and 2 stitches were placed at the distal area of the wound, the incident occurred on 04/27/2023.  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 Reports that he is concerned that the area is bleeding more today than it has been.  States that when he is up and off of his feet, that the pain is about a 0.  Reports that when he is up and moving around, pain is throbbing. Has history of hypertension and diabetes as well.  Denies any discolored discharge, odor, fever, tachycardia, shortness of breath. Denies other concerns today.   [facility]  [facility] Per HPI      Objective:    BP 118/72 (BP Location: Left Arm, Patient Position: Sitting, Cuff Size: Large)   Pulse 80   Temp 97.6 F (36.4 C) (Temporal)   Ht 5\' 10"  (1.778 m)   Wt 232 lb (105.2 kg)   SpO2 98%   BMI 33.29 kg/m    Physical Exam Nursing note reviewed.  Constitutional:      Appearance: Normal appearance.  HENT:     Head: Normocephalic and atraumatic.  Pulmonary:     Effort: Pulmonary effort is normal.  Musculoskeletal:        General: Normal range of motion.  Neurological:     Mental Status: He is alert.      No results found for any visits on 04/30/23.      Assessment & Plan:  1. Cellulitis of right lower extremity - doxycycline (VIBRAMYCIN) 100 MG capsule; Take 1 capsule (100 mg total) by mouth 2 (two) times daily.  Dispense: 14 capsule;  Refill: 0  Follow up as scheduled for suture removal Follow up sooner for discussed s/s infection Dressing changed in office   2. 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  Dispense: 14 capsule; Refill: 0 Discussed high risk of wound infection due to diabetes, discussed when to follow-up, discussed when to follow up for more acute care   Meds ordered this encounter  Medications   doxycycline (VIBRAMYCIN) 100 MG capsule    Sig: Take 1 capsule (100 mg total) by mouth 2 (two) times daily.    Dispense:  14 capsule    Refill:  0    Return in about 1 week (around 05/07/2023), or sooner if you are having signs of infection.  Moshe Cipro, FNP

## 2023-05-08 ENCOUNTER — Encounter: Payer: Self-pay | Admitting: Family Medicine

## 2023-05-08 ENCOUNTER — Ambulatory Visit (INDEPENDENT_AMBULATORY_CARE_PROVIDER_SITE_OTHER): Payer: Medicare HMO | Admitting: Family Medicine

## 2023-05-08 VITALS — BP 118/76 | HR 71 | Temp 97.9°F | Ht 70.0 in | Wt 232.8 lb

## 2023-05-08 DIAGNOSIS — Z4802 Encounter for removal of sutures: Secondary | ICD-10-CM

## 2023-05-08 DIAGNOSIS — S81801S Unspecified open wound, right lower leg, sequela: Secondary | ICD-10-CM

## 2023-05-08 DIAGNOSIS — M79604 Pain in right leg: Secondary | ICD-10-CM | POA: Diagnosis not present

## 2023-05-08 NOTE — Progress Notes (Signed)
   Acute Office Visit  Subjective:     Patient ID: Donald [name], male    DOB: 12-30-1955, 67 y.o.   MRN: [id number]  Chief Complaint  Patient presents with   Laceration    Follow up on left leg laceration. Still notes of pain and visible swelling but believes it to be healing up. Has finished antibiotic     HPI Patient is in today for re evaluation of wound to the right lower leg as well as suture removal. States that he is still having some pain and swelling when he is more up and active.  Denies any malodorous, discolored discharge from the area. He has been managing dressing changes at home. He has completed antibiotic course. Denies any chills, fever, abdominal pain, nausea, vomiting, diarrhea, rash, other symptoms. Medical history as outlined below.  ROS Per HPI      Objective:    BP 118/76   Pulse 71   Temp 97.9 F (36.6 C) (Oral)   Ht 5\' 10"  (1.778 m)   Wt 232 lb 12.8 oz (105.6 kg)   SpO2 97%   BMI 33.40 kg/m    Physical Exam Vitals and nursing note reviewed.  Constitutional:      Appearance: Normal appearance.  HENT:     Head: Normocephalic and atraumatic.  Eyes:     Extraocular Movements: Extraocular movements intact.  Musculoskeletal:     Cervical back: Normal range of motion.  Skin:    Findings: Lesion present.     Comments: Healing lesion to anterior RLE, dressing removed, wound cleansed, 2 sutures removed in office today, no complications, wound dressed.  Open skin is much smaller in size today, minimal bleeding noted, minimal amount serosanguineous drainage noted during dressing change.  No surrounding erythema, tenderness, or red streaking noted  Neurological:     Mental Status: He is alert.     No results found for any visits on 05/08/23.      Assessment & Plan:  1. Right leg pain  - Continue current wound care - Follow-up with me if you are having any increased redness, discharge, pain, swelling  2. Wound of right leg, sequela  -  Healing well, continue current wound care - Dressing changed  3. Encounter for removal of sutures  - 2 sutures removed in office today from laceration to RLE  No orders of the defined types were placed in this encounter.   Return if symptoms worsen or fail to improve.  Moshe Cipro, FNP

## 2023-05-11 ENCOUNTER — Other Ambulatory Visit: Payer: Self-pay | Admitting: Internal Medicine

## 2023-05-11 ENCOUNTER — Other Ambulatory Visit: Payer: Self-pay | Admitting: Neurology

## 2023-05-11 DIAGNOSIS — G4733 Obstructive sleep apnea (adult) (pediatric): Secondary | ICD-10-CM

## 2023-05-11 DIAGNOSIS — N201 Calculus of ureter: Secondary | ICD-10-CM

## 2023-05-11 DIAGNOSIS — N401 Enlarged prostate with lower urinary tract symptoms: Secondary | ICD-10-CM

## 2023-05-11 NOTE — Procedures (Signed)
[facility]  Donald [name] 67 year old male Aug 24, 1955   HOME SLEEP TEST REPORT ( by Watch PAT)   STUDY DATE:  04-26-2023 ORDERING CLINICIAN: Melvyn Novas, MD  Primary Neurologist: Joycelyn Schmid, MD  REFERRING CLINICIANTessa Lerner , DO and PCP DR. Polite, MD    CLINICAL INFORMATION/HISTORY: Complex Sleep Apnea , new referral for sleep consult on 03-08-2023:PCP Dr. Darrol Jump for an evlauation of CO2 retention.CPAP non -compliant., has not used PAP consistently over a year now. He does report sleep choking episodes. He is snoring.  Chief concern according to patient :  "new apnea evaluation needed". " I don't now why I stopped routine CPAP use ". Nasal mask user.   I have the pleasure of seeing Donald [name] 03/08/23 for his currently untreated sleep disorder.  Last sleep medicine visit in our clinic was based on Dr. Richrd Humbles referral: The patient had already been diagnosed with sleep apnea before,  had received a CPAP device,  but had trouble being compliant with CPAP therapy due to ear nose and throat problems.  He was first diagnosed in July 2017 with a rather complex and central form of sleep apnea at an AHI of 42/h. Complicating factor: There was also prolonged hypoxemia.  He was titrated in a split-night polysomnography to 8 cm water CPAP and his residual apnea was 2.6 /h and he had excellent compliance through the year 2018 at 97%.   He then developed louder and louder tinnitus, hearing difficulties and sinus pressure in the right ear and under the right face.  This interfered with sleeping on the right side.  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 Jearld Fenton has attended to his ENT needs.  The CPAP compliance at the time dropped due to this development and he also had more frequent concerns of contracting bronchitis, sinusitis and headaches.   CPAP range was set  between 5 and 15 cm water pressure with 3 cm EPR . When he received a new machine in 2020 the 95th percentile pressure at the time was 12 cm water and the residual AHI is 3.1 still excellent.   The last sleep study for this patient was performed as a home sleep test on an apnea link dated 10-01-2017.  The patient used an AirFit P10 nasal pillow in medium size at the time.   His BMI was 35, Epworth score 15 fatigue severity score 29 points.   The AHI of the home sleep test was 53/h and he had 92 minutes of oxygen desaturation with a nadir at 71% (!).     Epworth sleepiness score: 4/ 24 points   FSS endorsed at 28/ 63 points.    BMI: 33.3 kg/m   Neck Circumference: 18.5"   FINDINGS:   Sleep Summary:   Total Recording Time (hours, min):   8 hours 45 minutes  Total Sleep Time (hours, min):     8 hours 8 minutes            Percent REM (%):   25%     There was a sleep latency of only 6 minutes and the REM sleep latency of 77 minutes with 31 minutes of wakefulness after sleep onset time.  Respiratory Indices are reflecting AASM standards.  Results based on CMS scoring are displayed in (secondary position)   Calculated pAHI (per hour):     62/h   (46/h)                    REM pAHI:     71/h    (61/h)                                        NREM pAHI:   59/h   ( 40/h)                       Positional AHI:   The patient slept for the majority of the night in supine position with an AHI of 63/h that 112 minutes recorded on his left side with an AHI of 57.5/h. By CMS criteria the patient reached in supine sleep an AHI of 46 /h and in the left lateral sleep position an AHI of 40/h.                                                Oxygen Saturation Statistics:   Oxygen Saturation (%) Mean: 92%               O2 Saturation Range (%): Between a nadir at 79% with a maximum saturation of 99%                                     O2 Saturation (minutes) <89%:     10.8  minutes, equivalent to 2.2% of total sleep time.      Pulse Rate Statistics:   Pulse Mean (bpm): 67 bpm               Pulse Range: Between 48 and 123 bpm, this is borderline bradycardia tachycardia. (PS :the home sleep test device does not gather data indicating the cardiac rhythm)               IMPRESSION:  This HST confirms the presence of severe sleep apnea, interestingly this device did not calculate any central apneas to be present, and showed only minor hypoxia. Dx is severe OSA.  There was also no clear REM sleep dependency or positional dependency.   RECOMMENDATION: I would strongly recommend to resume CPAP therapy with an auto titration device that we can set openly between 5 and 20 cm water pressure to allow the patient to determine his 95th percentile need for positive airway pressure. The patient should be able to choose a interface of his comfort has used a nasal pillow in the past which may no longer be the most comfortable option for him.  Avoiding sleep on his back will help to reduce the apnea somewhat, and apnea reduction can also be achieved by further weight loss.    INTERPRETING PHYSICIAN:   Melvyn Novas, MD  [facility] Sleep Board certified by The [employer] of Sleep Medicine and Diplomate of the [employer] of [employer] Medicine. Board certified In Neurology through the ABPN, Fellow of the [employer] of Neu[employer].

## 2023-05-15 DIAGNOSIS — H524 Presbyopia: Secondary | ICD-10-CM | POA: Diagnosis not present

## 2023-05-15 DIAGNOSIS — E119 Type 2 diabetes mellitus without complications: Secondary | ICD-10-CM | POA: Diagnosis not present

## 2023-05-23 ENCOUNTER — Telehealth: Payer: Self-pay | Admitting: Neurology

## 2023-05-23 NOTE — Telephone Encounter (Signed)
-----   Message from Edmund Dohmeier sent at 05/11/2023 12:21 PM EST ----- This HST by Watch pat showed an uncomplicated form of severe OSA, minor hypoxia and no central events. The patient should resume CPAP use.  If CPAP is uncomfortable for pressure reasons, we can switch to BIPAP.  If the interface is uncomfortable, the patient needs to be refitted by DME.  PS :His current machine is short of 68 years old.    I strongly recommend to resume CPAP therapy with an auto-titration device that can be set to a wide open range: between 5 and 20 cm water pressure, to allow the patient to determine his 95th percentile need for positive airway pressure. Keep 3 cm EPR.  The patient should be able to choose a interface of his comfort , he has used a nasal pillow in the past which may no longer be the most comfortable option for him.   Avoiding sleep on his back will help to reduce the apnea somewhat, and apnea reduction can also be achieved by further weight loss.

## 2023-05-23 NOTE — Telephone Encounter (Signed)
Called patient to discuss sleep study results. No answer at this time. LVM for the patient to call back.   Pt returned call. I advised pt that Dr. Vickey Huger reviewed their sleep study results and found that pt has severe OSA. Dr. Vickey Huger recommends that pt starts auto CPAP. I reviewed PAP compliance expectations with the pt. Pt is agreeable to starting a CPAP. I advised pt that an order will be sent to a DME, [employer] [facility], and [employer] will call the pt within about one week after they file with the pt's insurance. [employer] [facility] will show the pt how to use the machine, fit for masks, and troubleshoot the CPAP if needed. A follow up appt will need to be made for insurance purposes with Dr. Vickey Huger or the NP within 31-90 days from the date starting new machine. Pt verbalized understanding to arrive 15 minutes early and bring their CPAP. Pt verbalized understanding of results. Pt had no questions at this time but was encouraged to call back if questions arise. I have sent the order to [employer] and have received confirmation that they have received the order.  Pt is unsure if his current machine is over 5 yrs old or not. Advised the pt that [employer] [facility] will be able to look for sure and if he is eligible for the new machine then he will have to make sure to follow up within that 31-90 day window. Advised if machine is not quite 68 yrs old, the company should still be able to made the pressure change adjustments to his current machine to ensure it is set the way Dr Vickey Huger is ordering. Advised we would still want to have a 3 month follow up to ensure the machine is working well in treating his apnea. Pt verbalized understanding. Pt had no questions at this time but was encouraged to call back if questions arise.

## 2023-06-09 ENCOUNTER — Other Ambulatory Visit: Payer: Self-pay | Admitting: Internal Medicine

## 2023-06-09 DIAGNOSIS — E119 Type 2 diabetes mellitus without complications: Secondary | ICD-10-CM

## 2023-06-12 ENCOUNTER — Encounter: Payer: Self-pay | Admitting: Nurse Practitioner

## 2023-06-12 ENCOUNTER — Ambulatory Visit: Payer: Medicare HMO | Admitting: Nurse Practitioner

## 2023-06-12 VITALS — BP 120/70 | HR 76 | Ht 67.0 in | Wt 240.0 lb

## 2023-06-12 DIAGNOSIS — K219 Gastro-esophageal reflux disease without esophagitis: Secondary | ICD-10-CM | POA: Diagnosis not present

## 2023-06-12 DIAGNOSIS — R1310 Dysphagia, unspecified: Secondary | ICD-10-CM | POA: Diagnosis not present
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
## 2023-06-12 NOTE — Patient Instructions (Addendum)
 It has been recommended to you by your physician that you have a(n) Endoscopy completed. Per your request, we did not schedule the procedure(s) today. Please contact our office at [phone] should you decide to have the procedure completed. You will be scheduled for a pre-visit and procedure at that time.  If your blood pressure at your visit was 140/90 or greater, please contact your primary care physician to follow up on this.  _______________________________________________________  If you are age 81 or older, your body mass index should be between 23-30. Your Body mass index is 37.59 kg/m. If this is out of the aforementioned range listed, please consider follow up with your Primary Care Provider.  If you are age 70 or younger, your body mass index should be between 19-25. Your Body mass index is 37.59 kg/m. If this is out of the aformentioned range listed, please consider follow up with your Primary Care Provider.   ________________________________________________________  The [facility] providers would like to encourage you to use MYCHART to communicate with providers for non-urgent requests or questions.  Due to long hold times on the telephone, sending your provider a message by [facility] may be a faster and more efficient way to get a response.  Please allow 48 business hours for a response.  Please remember that this is for non-urgent requests.  _______________________________________________________  Thank you for trusting me with your gastrointestinal care!   Elida Shawl, CRNP

## 2023-06-18 NOTE — Progress Notes (Signed)
 Elspeth, pls contact patient and inquire if he wishes to schedule an EGD as recommended at the time of this office visit. Pls let him know Dr. Avram also verified he is due for a colonoscopy at this time. Pls schedule patient for an EGD and colonoscopy if he is willing to do so. If patient wishes to discuss further, let me know, and I will gladly call him. THX.

## 2023-06-21 ENCOUNTER — Telehealth: Payer: Self-pay

## 2023-06-21 NOTE — Telephone Encounter (Signed)
Spoke with patient regarding NP/MD recommendations. He says he hasn't made up his mind yet, but will call us to schedule when ready.

## 2023-06-21 NOTE — Telephone Encounter (Signed)
Dr. Gessner FYI 

## 2023-06-21 NOTE — Telephone Encounter (Signed)
Author: Arnaldo Natal, NP Service: Gastroenterology Author Type: Nurse Practitioner  Filed: 06/18/2023  8:24 AM Encounter Date: 06/12/2023 Status: Signed  Editor: Arnaldo Natal, NP (Nurse Practitioner)   Viviann Spare, pls contact patient and inquire if he wishes to schedule an EGD as recommended at the time of this office visit. Pls let him know Dr. Leone Payor also verified he is due for a colonoscopy at this time. Pls schedule patient for an EGD and colonoscopy if he is willing to do so. If patient wishes to discuss further, let me know, and I will gladly call him. THX.

## 2023-07-01 ENCOUNTER — Other Ambulatory Visit: Payer: Self-pay | Admitting: Internal Medicine

## 2023-07-01 DIAGNOSIS — E119 Type 2 diabetes mellitus without complications: Secondary | ICD-10-CM

## 2023-07-03 DIAGNOSIS — G894 Chronic pain syndrome: Secondary | ICD-10-CM | POA: Diagnosis not present

## 2023-07-03 DIAGNOSIS — M48062 Spinal stenosis, lumbar region with neurogenic claudication: Secondary | ICD-10-CM | POA: Diagnosis not present

## 2023-07-03 DIAGNOSIS — M961 Postlaminectomy syndrome, not elsewhere classified: Secondary | ICD-10-CM | POA: Diagnosis not present

## 2023-07-03 DIAGNOSIS — Z79899 Other long term (current) drug therapy: Secondary | ICD-10-CM | POA: Diagnosis not present

## 2023-07-09 ENCOUNTER — Telehealth: Payer: Self-pay | Admitting: Pharmacy Technician

## 2023-07-09 ENCOUNTER — Other Ambulatory Visit (HOSPITAL_COMMUNITY): Payer: Self-pay

## 2023-07-09 NOTE — Telephone Encounter (Signed)
Pharmacy Patient Advocate Encounter   Received notification from CoverMyMeds that prior authorization for Mounjaro 7.5MG /0.5ML auto-injectors is required/requested.   Insurance verification completed.   The patient is insured through [employer] .   Per test claim: Refill too soon. PA is not needed at this time. Medication was filled 07/04/2023. Next eligible fill date is 08/08/2023.

## 2023-07-11 ENCOUNTER — Ambulatory Visit: Payer: Self-pay | Admitting: Internal Medicine

## 2023-07-11 ENCOUNTER — Ambulatory Visit
Admission: EM | Admit: 2023-07-11 | Discharge: 2023-07-11 | Disposition: A | Discharge status: Home / Self Care | Payer: Medicare HMO | Attending: Emergency Medicine | Admitting: Emergency Medicine

## 2023-07-11 DIAGNOSIS — L239 Allergic contact dermatitis, unspecified cause: Secondary | ICD-10-CM | POA: Diagnosis not present

## 2023-07-11 MED ORDER — PREDNISONE 10 MG (21) PO TBPK: ORAL_TABLET | Freq: Every day | ORAL | 0 refills | Status: DC
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
## 2023-07-11 NOTE — Discharge Instructions (Signed)
 Today you are being treated for the your face which is consistent with a reaction to chemicals that she may have come in contact with  take prednisone  every morning with food as directed to help reduce the inflammatory process that occurs with this rash which will help minimize your itching as well as begin to clear  You may continue use of topical calamine or Benadryl  cream to help manage itching, you may also continue oral Benadryl   Please avoid long exposures to heat such as a hot steamy shower or being outside as this may cause further irritation to your rash  You may follow-up with his urgent care as needed if symptoms persist or worsen
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
## 2023-07-12 ENCOUNTER — Encounter: Payer: Self-pay | Admitting: Emergency Medicine

## 2023-07-12 ENCOUNTER — Ambulatory Visit (INDEPENDENT_AMBULATORY_CARE_PROVIDER_SITE_OTHER): Payer: Medicare HMO | Admitting: Emergency Medicine

## 2023-07-12 VITALS — BP 108/68 | HR 75 | Temp 98.3°F | Ht 67.0 in | Wt 235.0 lb

## 2023-07-12 DIAGNOSIS — T7840XA Allergy, unspecified, initial encounter: Secondary | ICD-10-CM | POA: Diagnosis not present

## 2023-07-12 NOTE — Patient Instructions (Signed)
 Continue and finish corticosteroids May take over-the-counter antihistamine for itching Contact the office if no better or worse during the next several days  Contact Dermatitis Dermatitis is when your skin becomes red, sore, and swollen.  Contact dermatitis happens when your body reacts to something that touches the skin. There are 2 types: Irritant contact dermatitis. This is when something bothers your skin, like soap. Allergic contact dermatitis. This is when your skin touches something you are allergic to, like poison ivy. What are the causes? Irritant contact dermatitis may be caused by: Makeup. Soaps. Detergents. Bleaches. Acids. Metals, like nickel. Allergic contact dermatitis may be caused by: Plants. Chemicals. Jewelry. Latex. Medicines. Preservatives. These are things added to products to help them last longer. There may be some in your clothes. What increases the risk? Having a job where you have to be near things that bother your skin. Having asthma or eczema. What are the signs or symptoms?  Dry or flaky skin. Redness. Cracks. Itching. Moderate symptoms of this condition include: Pain or a burning feeling. Blisters. Blood or clear fluid coming from cracks in your skin. Swelling. This may be on your eyelids, mouth, or genitals. How is this treated? Your doctor will find out what is making your skin react. Then, you can protect your skin. You may need to use: Steroid creams, ointments, or medicines. Antibiotics or other ointments, if you have a skin infection. Lotion or medicines to help with itching. A bandage. Follow these instructions at home: Skin care Put moisturizer on your skin when it needs it. Put cool, wet cloths on your skin (cool compresses). Put a baking soda paste on your skin. Stir water into baking soda until it looks like a paste. Do not scratch your skin. Try not to have things rub up against your skin. Avoid tight clothing. Avoid using  soaps, perfumes, and dyes. Check your skin every day for signs of infection. Check for: More redness, swelling, or pain. More fluid or blood. Warmth. Pus or a bad smell. Medicines Take or apply over-the-counter and prescription medicines only as told by your doctor. If you were prescribed antibiotics, take or apply them as told by your doctor. Do not stop using them even if you start to feel better. Bathing Take a bath with: Epsom salts. Baking soda. Colloidal oatmeal. Bathe less often. Bathe in warm water. Try not to use hot water. Bandage care If you were given a bandage, change it as told by your doctor. Wash your hands with soap and water for at least 20 seconds before and after you change your bandage. If you cannot use soap and water, use hand sanitizer. General instructions Avoid the things that caused your reaction. If you don't know what caused it, keep a journal. Write down: What you eat. What skin products you use. What you drink. What you wear. Contact a doctor if: You do not get better with treatment. You get worse. You have signs of infection. You have a fever. You have new symptoms. Your bone or joint near the area hurts after the skin has healed. Get help right away if: You see red streaks coming from the area. The area turns darker. You have trouble breathing. This information is not intended to replace advice given to you by your health care provider. Make sure you discuss any questions you have with your health care provider. Document Revised: 11/25/2021 Document Reviewed: 11/25/2021 Elsevier Patient Education  2024 [employer].

## 2023-07-12 NOTE — Assessment & Plan Note (Signed)
 Clinically stable.  No red flag signs or symptoms. No findings of angioedema Localized allergic dermatitis to facial area Known trigger. Already started on corticosteroids May take over-the-counter antihistamine such as Zyrtec or Claritin daily for 5 days ED precautions given Advised to contact the office if no better or worse during the next several days
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
## 2023-07-26 ENCOUNTER — Encounter: Payer: Medicare HMO | Admitting: Internal Medicine

## 2023-08-01 ENCOUNTER — Encounter: Payer: Self-pay | Admitting: Emergency Medicine

## 2023-08-01 ENCOUNTER — Ambulatory Visit (INDEPENDENT_AMBULATORY_CARE_PROVIDER_SITE_OTHER): Payer: Medicare HMO | Admitting: Emergency Medicine

## 2023-08-01 VITALS — BP 128/78 | HR 78 | Temp 98.3°F | Ht 67.0 in | Wt 236.0 lb

## 2023-08-01 DIAGNOSIS — R21 Rash and other nonspecific skin eruption: Secondary | ICD-10-CM | POA: Diagnosis not present

## 2023-08-01 NOTE — Assessment & Plan Note (Signed)
 Clinically stable.  No red flag signs or symptoms No findings of angioedema Persistent rash despite oral corticosteroids Needs dermatology evaluation Referral placed today

## 2023-08-01 NOTE — Patient Instructions (Signed)
 Rash, Adult  A rash is a breakout of spots or blotches on the skin. It can change the way your skin looks and feels. Many things can cause a rash. The goal of treatment is to stop the itching and keep the rash from spreading. Follow these instructions at home: Medicine Take or apply over-the-counter and prescription medicines only as told by your doctor. These may include medicines to treat: Red or swollen skin. Itching. An allergy. Pain. An infection.  Skin care Put a cool, wet cloth on the rash. Do not scratch or rub your skin. Try not to cover the rash. Keep it exposed to air as often as you can. Managing itching and discomfort Avoid hot showers or baths. These can make itching worse. A cold shower may help. Try taking a bath with: Epsom salts. You can get these at your pharmacy or grocery store. Follow the instructions on the package. Baking soda. Pour a small amount into the bath as told by your doctor. Colloidal oatmeal. You can get this at your pharmacy or grocery store. Follow the instructions on the package. Try putting baking soda paste on your skin. Stir water into baking soda until it gets like a paste. Try putting on a lotion to help with itching (calamine lotion). Keep cool. Stay out of the sun. Sweating and being hot can make itching worse. General instructions  Rest as needed. Drink enough fluid to keep your pee (urine) pale yellow. Wear loose-fitting clothes. Avoid scented soaps, detergents, and perfumes. Use gentle soaps, detergents, perfumes, and cosmetics. Avoid the things that cause your rash. Keep a journal to help keep track of what causes your rash. Write down: What you eat. What cosmetics you use. What you drink. What you wear. This includes jewelry. Contact a doctor if: You sweat a lot at night. You pee (urinate) more or less than normal. Your pee is a darker color than normal. Your eyes are sensitive to light. Your skin or the white parts of your  eyes turn yellow. Your skin tingles or is numb. You get painful blisters in your nose or mouth. Your rash does not go away after a few days, or it gets worse. You are more tired than normal. You are more thirsty than normal. You have new or worse symptoms. These may include: Pain in your belly. A fever. Watery poop (diarrhea). Vomiting. Weakness. Weight loss. Get help right away if: You start to feel mixed up (confused). You have a very bad headache or a stiff neck. You have very bad joint pain or stiffness. You get very sleepy or not responsive. You have a seizure. This information is not intended to replace advice given to you by your health care provider. Make sure you discuss any questions you have with your health care provider. Document Revised: 03/10/2022 Document Reviewed: 03/10/2022 Elsevier Patient Education  2024 [employer].
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
## 2023-08-06 ENCOUNTER — Encounter: Payer: Self-pay | Admitting: Internal Medicine

## 2023-08-09 ENCOUNTER — Other Ambulatory Visit: Payer: Self-pay | Admitting: Internal Medicine

## 2023-08-10 ENCOUNTER — Other Ambulatory Visit: Payer: Self-pay | Admitting: Cardiology

## 2023-08-10 DIAGNOSIS — I10 Essential (primary) hypertension: Secondary | ICD-10-CM

## 2023-08-22 ENCOUNTER — Encounter: Payer: Self-pay | Admitting: Emergency Medicine

## 2023-08-23 ENCOUNTER — Ambulatory Visit (AMBULATORY_SURGERY_CENTER)

## 2023-08-23 ENCOUNTER — Ambulatory Visit: Payer: Medicare HMO

## 2023-08-23 VITALS — Ht 67.0 in | Wt 215.0 lb

## 2023-08-23 DIAGNOSIS — K219 Gastro-esophageal reflux disease without esophagitis: Secondary | ICD-10-CM

## 2023-08-23 DIAGNOSIS — Z0000 Encounter for general adult medical examination without abnormal findings: Secondary | ICD-10-CM

## 2023-08-23 DIAGNOSIS — R1310 Dysphagia, unspecified: Secondary | ICD-10-CM

## 2023-08-23 DIAGNOSIS — Z860100 Personal history of colon polyps, unspecified: Secondary | ICD-10-CM

## 2023-08-23 MED ORDER — PEG 3350-KCL-NA BICARB-NACL 420 G PO SOLR: 4000.0000 mL | Freq: Once | ORAL | 0 refills | Status: AC

## 2023-08-23 MED ORDER — BISACODYL EC 5 MG PO TBEC: 5.0000 mg | DELAYED_RELEASE_TABLET | ORAL | 0 refills | Status: DC

## 2023-08-23 NOTE — Patient Instructions (Addendum)
 [name] , Thank you for taking time to come for your Medicare Wellness Visit. I appreciate your ongoing commitment to your health goals. Please review the following plan we discussed and let me know if I can assist you in the future.   Referrals/Orders/Follow-Ups/Clinician Recommendations: It was nice to meet you today.  Keep up the good work.  This is a list of the screening recommended for you and due dates:  Health Maintenance  Topic Date Due   Zoster (Shingles) Vaccine (1 of 2) Never done   Medicare Annual Wellness Visit  03/10/2023   Flu Shot  09/03/2023*   COVID-19 Vaccine (6 - Pfizer risk 2024-25 season) 09/11/2023   Hemoglobin A1C  10/22/2023   Yearly kidney health urinalysis for diabetes  11/30/2023   Complete foot exam   11/30/2023   Eye exam for diabetics  02/29/2024   Yearly kidney function blood test for diabetes  04/26/2024   Colon Cancer Screening  08/18/2026   DTaP/Tdap/Td vaccine (4 - Td or Tdap) 04/27/2033   Hepatitis C Screening  Completed   HPV Vaccine  Aged Out   Pneumonia Vaccine  Discontinued  *Topic was postponed. The date shown is not the original due date.    Advanced directives: (Declined) Advance directive discussed with you today. Even though you declined this today, please call our office should you change your mind, and we can give you the proper paperwork for you to fill out.  Next Medicare Annual Wellness Visit scheduled for next year: Yes
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
## 2023-08-23 NOTE — Progress Notes (Signed)
 No egg or soy allergy known to patient  No issues known to pt with past sedation with any surgeries or procedures Patient denies ever being told they had issues or difficulty with intubation  No FH of Malignant Hyperthermia Pt is not on diet pills Pt is not on  home 02  Pt is not on blood thinners  Pt denies issues with constipation  No A fib or A flutter Have any cardiac testing pending-- no pending test echo scheduled for may 2025 LOA: independent  Prep:  golytely   Patient's chart reviewed by Cathlyn Parsons CNRA prior to previsit and patient appropriate for the [facility].  Previsit completed and red dot placed by patient's name on their procedure day (on provider's schedule).     PV completed with patient. Prep instructions sent via mychart and home address.

## 2023-08-24 ENCOUNTER — Other Ambulatory Visit: Payer: Self-pay | Admitting: Internal Medicine

## 2023-08-24 NOTE — Addendum Note (Signed)
 Addended by: Aundra Millet on: 08/24/2023 10:28 AM   Modules accepted: Orders

## 2023-08-29 ENCOUNTER — Encounter: Payer: Self-pay | Admitting: Internal Medicine

## 2023-08-29 ENCOUNTER — Ambulatory Visit (INDEPENDENT_AMBULATORY_CARE_PROVIDER_SITE_OTHER)

## 2023-08-29 ENCOUNTER — Ambulatory Visit (INDEPENDENT_AMBULATORY_CARE_PROVIDER_SITE_OTHER): Payer: Medicare HMO | Admitting: Internal Medicine

## 2023-08-29 VITALS — BP 128/76 | HR 68 | Temp 97.9°F | Resp 16 | Ht 67.0 in | Wt 240.4 lb

## 2023-08-29 DIAGNOSIS — L299 Pruritus, unspecified: Secondary | ICD-10-CM | POA: Diagnosis not present

## 2023-08-29 DIAGNOSIS — K7400 Hepatic fibrosis, unspecified: Secondary | ICD-10-CM

## 2023-08-29 DIAGNOSIS — I10 Essential (primary) hypertension: Secondary | ICD-10-CM | POA: Diagnosis not present

## 2023-08-29 DIAGNOSIS — R220 Localized swelling, mass and lump, head: Secondary | ICD-10-CM | POA: Insufficient documentation

## 2023-08-29 DIAGNOSIS — R9431 Abnormal electrocardiogram [ECG] [EKG]: Secondary | ICD-10-CM | POA: Insufficient documentation

## 2023-08-29 DIAGNOSIS — E119 Type 2 diabetes mellitus without complications: Secondary | ICD-10-CM

## 2023-08-29 LAB — URINALYSIS, ROUTINE W REFLEX MICROSCOPIC
Bilirubin Urine: NEGATIVE
Hgb urine dipstick: NEGATIVE
Ketones, ur: NEGATIVE
Leukocytes,Ua: NEGATIVE
Nitrite: NEGATIVE
Specific Gravity, Urine: 1.02 (ref 1.000–1.030)
Total Protein, Urine: NEGATIVE
Urine Glucose: NEGATIVE
Urobilinogen, UA: 0.2 (ref 0.0–1.0)
pH: 7 (ref 5.0–8.0)

## 2023-08-29 LAB — CBC WITH DIFFERENTIAL/PLATELET
Basophils Absolute: 0 10*3/uL (ref 0.0–0.1)
Basophils Relative: 0.3 % (ref 0.0–3.0)
Eosinophils Absolute: 0 10*3/uL (ref 0.0–0.7)
Eosinophils Relative: 0.3 % (ref 0.0–5.0)
HCT: 44.1 % (ref 39.0–52.0)
Hemoglobin: 14.6 g/dL (ref 13.0–17.0)
Lymphocytes Relative: 25.7 % (ref 12.0–46.0)
Lymphs Abs: 1.8 10*3/uL (ref 0.7–4.0)
MCHC: 33.1 g/dL (ref 30.0–36.0)
MCV: 89.9 fl (ref 78.0–100.0)
Monocytes Absolute: 0.4 10*3/uL (ref 0.1–1.0)
Monocytes Relative: 5.8 % (ref 3.0–12.0)
Neutro Abs: 4.7 10*3/uL (ref 1.4–7.7)
Neutrophils Relative %: 67.9 % (ref 43.0–77.0)
Platelets: 189 10*3/uL (ref 150.0–400.0)
RBC: 4.91 Mil/uL (ref 4.22–5.81)
RDW: 13.1 % (ref 11.5–15.5)
WBC: 7 10*3/uL (ref 4.0–10.5)

## 2023-08-29 LAB — BASIC METABOLIC PANEL
BUN: 18 mg/dL (ref 6–23)
CO2: 34 meq/L — ABNORMAL HIGH (ref 19–32)
Calcium: 9.8 mg/dL (ref 8.4–10.5)
Chloride: 99 meq/L (ref 96–112)
Creatinine, Ser: 0.91 mg/dL (ref 0.40–1.50)
GFR: 86.32 mL/min (ref >60.00)
Glucose, Bld: 104 mg/dL — ABNORMAL HIGH (ref 70–99)
Potassium: 3.9 meq/L (ref 3.5–5.1)
Sodium: 139 meq/L (ref 135–145)

## 2023-08-29 LAB — MICROALBUMIN / CREATININE URINE RATIO
Creatinine,U: 112.6 mg/dL
Microalb Creat Ratio: UNDETERMINED mg/g (ref 0.0–30.0)
Microalb, Ur: <0.7 mg/dL

## 2023-08-29 LAB — PROTIME-INR
INR: 1 ratio (ref 0.8–1.0)
Prothrombin Time: 11 s (ref 9.6–13.1)

## 2023-08-29 LAB — HEPATIC FUNCTION PANEL
ALT: 20 U/L (ref 0–53)
AST: 20 U/L (ref 0–37)
Albumin: 4.5 g/dL (ref 3.5–5.2)
Alkaline Phosphatase: 47 U/L (ref 39–117)
Bilirubin, Direct: 0.1 mg/dL (ref 0.0–0.3)
Total Bilirubin: 0.4 mg/dL (ref 0.2–1.2)
Total Protein: 7.9 g/dL (ref 6.0–8.3)

## 2023-08-29 LAB — C-REACTIVE PROTEIN: CRP: <1 mg/dL (ref 0.5–20.0)

## 2023-08-29 LAB — HEMOGLOBIN A1C: Hgb A1c MFr Bld: 6.1 % (ref 4.6–6.5)

## 2023-08-29 MED ORDER — HYDROXYZINE HCL 10 MG PO TABS: 10.0000 mg | ORAL_TABLET | Freq: Three times a day (TID) | ORAL | 2 refills | Status: DC | PRN

## 2023-08-29 NOTE — Patient Instructions (Signed)
 Hypertension, Adult High blood pressure (hypertension) is when the force of blood pumping through the arteries is too strong. The arteries are the blood vessels that carry blood from the heart throughout the body. Hypertension forces the heart to work harder to pump blood and may cause arteries to become narrow or stiff. Untreated or uncontrolled hypertension can lead to a heart attack, heart failure, a stroke, kidney disease, and other problems. A blood pressure reading consists of a higher number over a lower number. Ideally, your blood pressure should be below 120/80. The first ("top") number is called the systolic pressure. It is a measure of the pressure in your arteries as your heart beats. The second ("bottom") number is called the diastolic pressure. It is a measure of the pressure in your arteries as the heart relaxes. What are the causes? The exact cause of this condition is not known. There are some conditions that result in high blood pressure. What increases the risk? Certain factors may make you more likely to develop high blood pressure. Some of these risk factors are under your control, including: Smoking. Not getting enough exercise or physical activity. Being overweight. Having too much fat, sugar, calories, or salt (sodium) in your diet. Drinking too much alcohol. Other risk factors include: Having a personal history of heart disease, diabetes, high cholesterol, or kidney disease. Stress. Having a family history of high blood pressure and high cholesterol. Having obstructive sleep apnea. Age. The risk increases with age. What are the signs or symptoms? High blood pressure may not cause symptoms. Very high blood pressure (hypertensive crisis) may cause: Headache. Fast or irregular heartbeats (palpitations). Shortness of breath. Nosebleed. Nausea and vomiting. Vision changes. Severe chest pain, dizziness, and seizures. How is this diagnosed? This condition is diagnosed by  measuring your blood pressure while you are seated, with your arm resting on a flat surface, your legs uncrossed, and your feet flat on the floor. The cuff of the blood pressure monitor will be placed directly against the skin of your upper arm at the level of your heart. Blood pressure should be measured at least twice using the same arm. Certain conditions can cause a difference in blood pressure between your right and left arms. If you have a high blood pressure reading during one visit or you have normal blood pressure with other risk factors, you may be asked to: Return on a different day to have your blood pressure checked again. Monitor your blood pressure at home for 1 week or longer. If you are diagnosed with hypertension, you may have other blood or imaging tests to help your health care provider understand your overall risk for other conditions. How is this treated? This condition is treated by making healthy lifestyle changes, such as eating healthy foods, exercising more, and reducing your alcohol intake. You may be referred for counseling on a healthy diet and physical activity. Your health care provider may prescribe medicine if lifestyle changes are not enough to get your blood pressure under control and if: Your systolic blood pressure is above 130. Your diastolic blood pressure is above 80. Your personal target blood pressure may vary depending on your medical conditions, your age, and other factors. Follow these instructions at home: Eating and drinking  Eat a diet that is high in fiber and potassium, and low in sodium, added sugar, and fat. An example of this eating plan is called the DASH diet. DASH stands for Dietary Approaches to Stop Hypertension. To eat this way: Eat  plenty of fresh fruits and vegetables. Try to fill one half of your plate at each meal with fruits and vegetables. Eat whole grains, such as whole-wheat pasta, brown rice, or whole-grain bread. Fill about one  fourth of your plate with whole grains. Eat or drink low-fat dairy products, such as skim milk or low-fat yogurt. Avoid fatty cuts of meat, processed or cured meats, and poultry with skin. Fill about one fourth of your plate with lean proteins, such as fish, chicken without skin, beans, eggs, or tofu. Avoid pre-made and processed foods. These tend to be higher in sodium, added sugar, and fat. Reduce your daily sodium intake. Many people with hypertension should eat less than 1,500 mg of sodium a day. Do not drink alcohol if: Your health care provider tells you not to drink. You are pregnant, may be pregnant, or are planning to become pregnant. If you drink alcohol: Limit how much you have to: 0-1 drink a day for women. 0-2 drinks a day for men. Know how much alcohol is in your drink. In the [place] one drink equals one 12 oz bottle of beer (355 mL), one 5 oz glass of wine (148 mL), or one 1 oz glass of hard liquor (44 mL). Lifestyle  Work with your health care provider to maintain a healthy body weight or to lose weight. Ask what an ideal weight is for you. Get at least 30 minutes of exercise that causes your heart to beat faster (aerobic exercise) most days of the week. Activities may include walking, swimming, or biking. Include exercise to strengthen your muscles (resistance exercise), such as Pilates or lifting weights, as part of your weekly exercise routine. Try to do these types of exercises for 30 minutes at least 3 days a week. Do not use any products that contain nicotine or tobacco. These products include cigarettes, chewing tobacco, and vaping devices, such as e-cigarettes. If you need help quitting, ask your health care provider. Monitor your blood pressure at home as told by your health care provider. Keep all follow-up visits. This is important. Medicines Take over-the-counter and prescription medicines only as told by your health care provider. Follow directions carefully. Blood  pressure medicines must be taken as prescribed. Do not skip doses of blood pressure medicine. Doing this puts you at risk for problems and can make the medicine less effective. Ask your health care provider about side effects or reactions to medicines that you should watch for. Contact a health care provider if you: Think you are having a reaction to a medicine you are taking. Have headaches that keep coming back (recurring). Feel dizzy. Have swelling in your ankles. Have trouble with your vision. Get help right away if you: Develop a severe headache or confusion. Have unusual weakness or numbness. Feel faint. Have severe pain in your chest or abdomen. Vomit repeatedly. Have trouble breathing. These symptoms may be an emergency. Get help right away. Call 911. Do not wait to see if the symptoms will go away. Do not drive yourself to the hospital. Summary Hypertension is when the force of blood pumping through your arteries is too strong. If this condition is not controlled, it may put you at risk for serious complications. Your personal target blood pressure may vary depending on your medical conditions, your age, and other factors. For most people, a normal blood pressure is less than 120/80. Hypertension is treated with lifestyle changes, medicines, or a combination of both. Lifestyle changes include losing weight, eating a healthy,  low-sodium diet, exercising more, and limiting alcohol. This information is not intended to replace advice given to you by your health care provider. Make sure you discuss any questions you have with your health care provider. Document Revised: 03/29/2021 Document Reviewed: 03/29/2021 Elsevier Patient Education  2024 [employer].

## 2023-08-29 NOTE — Progress Notes (Unsigned)
 Subjective:  Patient ID: Donald [name], male    DOB: Nov 26, 1955  Age: 68 y.o. MRN: [id number]  CC: Hypertension, Diabetes, and Hyperlipidemia   HPI Donald [name] presents for f/up ---  Discussed the use of AI scribe software for clinical note transcription with the patient, who gave verbal consent to proceed.  History of Present Illness   Donald [name] is a 68 year old male who presents with facial swelling and skin rash.  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 Despite treatment with a prednisone pack, the rash has persisted. He has been unable to secure an urgent dermatology appointment.  He has recently resumed using his CPAP machine and notes that the left side of his face is swollen and puffy upon waking. No sinus trouble, epistaxis, phlegm, sore throat, coughing, wheezing, chest pain, shortness of breath, dizziness, or lightheadedness. The steroid pack provided some relief but did not completely resolve his symptoms.  He mentions having some dental issues, with bad teeth that occasionally cause pain depending on what he eats but denies any current soreness or swelling in the mouth.  He is currently taking hydrochlorothiazide, omeprazole, Mounjaro, meloxicam, tamsulosin, and uses fluticasone nasal spray as needed. He also takes vitamin D and vitamin B12 supplements. He denies taking doxycycline.  He reports eating only one meal a day, typically a light lunch consisting of fruit, crackers, and peanut butter, with dinner being his largest meal. He attributes his reduced appetite to a lack of desire to eat rather than abdominal pain, nausea, or vomiting.        Outpatient Medications Prior to Visit  Medication Sig Dispense Refill   b complex-vitamin c-folic acid (NEPHRO-VITE) 0.8 MG TABS tablet Take by mouth.     bisacodyl 5 MG EC tablet Take 1 tablet (5 mg total) by mouth as  directed. 4 tablet 0   buprenorphine-naloxone (SUBOXONE) 8-2 mg SUBL SL tablet Place 1 tablet under the tongue daily.     Cholecalciferol (VITAMIN D3) 125 MCG (5000 UT) CAPS Take 5,000 Units by mouth in the morning and at bedtime.     Cyanocobalamin (B-12 PO) Take 4 drops by mouth daily.     fluticasone (FLONASE) 50 MCG/ACT nasal spray Place 2 sprays into both nostrils daily as needed for allergies or rhinitis. 16 each 3   Folic Acid-Vit B6-Vit B12 0.5-5-0.2 MG TABS Take 1 tablet by mouth daily. B6 Complex     hydrochlorothiazide (HYDRODIURIL) 25 MG tablet TAKE 1 TABLET BY MOUTH ONCE DAILY IN THE MORNING 90 tablet 0   meloxicam (MOBIC) 15 MG tablet Take 1 tablet by mouth once daily 90 tablet 0   MOUNJARO 7.5 MG/0.5ML Pen INJECT 7.5 MG INTO THE SKIN ONCE A WEEK 12 mL 0   Multiple Vitamin (MULTI-VITAMIN) tablet Take 1 tablet by mouth daily.     naloxone (NARCAN) nasal spray 4 mg/0.1 mL Place 1 spray into the nose as needed.     NON FORMULARY Take 5 drops by mouth daily. Vitamin A,D,K daily     Omega 3-6-9 Fatty Acids (OMEGA-3-6-9 PO) Take 15 mLs by mouth daily. 15.9 g     omeprazole (PRILOSEC) 20 MG capsule TAKE 1 CAPSULE BY MOUTH ONCE DAILY AS NEEDED FOR  ACID  REFLUX 90 capsule 0   tamsulosin (FLOMAX) 0.4 MG CAPS capsule Take 1 capsule by mouth once daily 90 capsule 0   No facility-administered  medications prior to visit.    ROS Review of Systems  Constitutional:  Positive for unexpected weight change (weight gain). Negative for appetite change, chills, diaphoresis and fatigue.  HENT:  Negative for trouble swallowing.   Respiratory:  Positive for apnea. Negative for cough, shortness of breath and wheezing.   Cardiovascular:  Negative for chest pain, palpitations and leg swelling.  Gastrointestinal: Negative.  Negative for abdominal pain, constipation, diarrhea, nausea and vomiting.  Genitourinary: Negative.  Negative for difficulty urinating.  Musculoskeletal: Negative.  Negative for  arthralgias and myalgias.  Skin: Negative.        ++itching  Neurological:  Negative for dizziness and weakness.  Hematological:  Negative for adenopathy. Does not bruise/bleed easily.  Psychiatric/Behavioral:  Positive for confusion and decreased concentration.     Objective:  BP 128/76 (BP Location: Left Arm, Patient Position: Sitting, Cuff Size: Normal)   Pulse 68   Temp 97.9 F (36.6 C) (Oral)   Resp 16   Ht 5\' 7"  (1.702 m)   Wt 240 lb 6.4 oz (109 kg)   SpO2 95%   BMI 37.65 kg/m   BP Readings from Last 3 Encounters:  08/29/23 128/76  08/01/23 128/78  07/12/23 108/68    Wt Readings from Last 3 Encounters:  08/29/23 240 lb 6.4 oz (109 kg)  08/23/23 215 lb (97.5 kg)  08/01/23 236 lb (107 kg)    Physical Exam Vitals reviewed.  Constitutional:      General: He is not in acute distress.    Appearance: He is obese. He is not ill-appearing, toxic-appearing or diaphoretic.  HENT:     Head:     Comments: Mild bilateral facial plethora but no asymmetry or swelling    Mouth/Throat:     Dentition: Abnormal dentition. Does not have dentures. No dental tenderness, gingival swelling, dental caries, dental abscesses or gum lesions.     Tongue: No lesions.     Pharynx: Oropharynx is clear. No pharyngeal swelling, oropharyngeal exudate or posterior oropharyngeal erythema.     Tonsils: No tonsillar exudate or tonsillar abscesses.  Eyes:     General: No scleral icterus.    Conjunctiva/sclera: Conjunctivae normal.  Cardiovascular:     Rate and Rhythm: Normal rate and regular rhythm.     Heart sounds: No murmur heard.    No friction rub. No gallop.     Comments: EKG---- NSR, 68 bpm Anterior infarct pattern is not new No LVH or acute ST/T wave changes Pulmonary:     Effort: Pulmonary effort is normal.     Breath sounds: No stridor. No wheezing, rhonchi or rales.  Abdominal:     General: Abdomen is protuberant. Bowel sounds are normal. There is no distension.     Palpations:  Abdomen is soft. There is no hepatomegaly, splenomegaly or mass.     Tenderness: There is no abdominal tenderness. There is no guarding.  Musculoskeletal:        General: Normal range of motion.     Cervical back: Neck supple.     Right lower leg: No edema.     Left lower leg: No edema.  Lymphadenopathy:     Cervical: No cervical adenopathy.  Skin:    General: Skin is warm and dry.     Findings: No erythema or rash.  Neurological:     General: No focal deficit present.     Mental Status: He is alert. Mental status is at baseline.  Psychiatric:        Mood  and Affect: Mood normal.        Behavior: Behavior normal.     Lab Results  Component Value Date   WBC 7.0 08/29/2023   HGB 14.6 08/29/2023   HCT 44.1 08/29/2023   PLT 189.0 08/29/2023   GLUCOSE 104 (H) 08/29/2023   CHOL 129 11/30/2022   TRIG 96.0 11/30/2022   HDL 57.60 11/30/2022   LDLCALC 52 11/30/2022   ALT 20 08/29/2023   AST 20 08/29/2023   NA 139 08/29/2023   K 3.9 08/29/2023   CL 99 08/29/2023   CREATININE 0.91 08/29/2023   BUN 18 08/29/2023   CO2 34 (H) 08/29/2023   TSH 0.92 01/23/2023   PSA 0.92 01/23/2023   INR 1.0 08/29/2023   HGBA1C 6.1 08/29/2023   MICROALBUR <0.7 08/29/2023    DG Facial Bones Complete Result Date: 08/29/2023 CLINICAL DATA:  Left facial swelling. EXAM: FACIAL BONES COMPLETE 3+V COMPARISON:  None Available. FINDINGS: There is no evidence of fracture or other significant bone abnormality. No orbital emphysema or sinus air-fluid levels are seen. IMPRESSION: Negative. Electronically Signed   By: Darliss Cheney M.D.   On: 08/29/2023 15:25     Assessment & Plan:  Left facial swelling- The work up is reassuring. -     CBC with Differential/Platelet; Future -     C-reactive protein; Future -     DG Facial Bones Complete; Future  Primary hypertension- His BP is well controlled. -     Basic metabolic panel; Future -     CBC with Differential/Platelet; Future -     EKG 12-Lead -      Urinalysis, Routine w reflex microscopic; Future  Type 2 diabetes mellitus without complication, without long-term current use of insulin (HCC)- His blood sugar is well controlled. -     Basic metabolic panel; Future -     Hemoglobin A1c; Future -     Microalbumin / creatinine urine ratio; Future -     Urinalysis, Routine w reflex microscopic; Future  Liver fibrosis- MELD is 6. -     Protime-INR; Future -     Hepatic function panel; Future  Abnormal EKG -     Ambulatory referral to Cardiology  Itching- Exam and labs are reassuring. -     hydrOXYzine HCl; Take 1 tablet (10 mg total) by mouth 3 (three) times daily as needed.  Dispense: 90 tablet; Refill: 2     Follow-up: Return in about 3 months (around 11/29/2023).  Sanda Linger, MD

## 2023-08-31 ENCOUNTER — Other Ambulatory Visit: Payer: Self-pay | Admitting: Internal Medicine

## 2023-08-31 DIAGNOSIS — N201 Calculus of ureter: Secondary | ICD-10-CM

## 2023-08-31 DIAGNOSIS — N401 Enlarged prostate with lower urinary tract symptoms: Secondary | ICD-10-CM

## 2023-09-03 ENCOUNTER — Other Ambulatory Visit: Payer: Self-pay | Admitting: Internal Medicine
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
## 2023-09-20 ENCOUNTER — Encounter: Payer: Self-pay | Admitting: Internal Medicine

## 2023-09-20 ENCOUNTER — Ambulatory Visit: Admitting: Internal Medicine

## 2023-09-20 VITALS — BP 121/72 | HR 65 | Temp 99.1°F | Resp 16 | Ht 70.0 in | Wt 215.0 lb

## 2023-09-20 DIAGNOSIS — R1310 Dysphagia, unspecified: Secondary | ICD-10-CM | POA: Diagnosis not present

## 2023-09-20 DIAGNOSIS — D128 Benign neoplasm of rectum: Secondary | ICD-10-CM

## 2023-09-20 DIAGNOSIS — F32A Depression, unspecified: Secondary | ICD-10-CM | POA: Diagnosis not present

## 2023-09-20 DIAGNOSIS — E119 Type 2 diabetes mellitus without complications: Secondary | ICD-10-CM | POA: Diagnosis not present

## 2023-09-20 DIAGNOSIS — Z1211 Encounter for screening for malignant neoplasm of colon: Secondary | ICD-10-CM

## 2023-09-20 DIAGNOSIS — Z860101 Personal history of adenomatous and serrated colon polyps: Secondary | ICD-10-CM

## 2023-09-20 DIAGNOSIS — K219 Gastro-esophageal reflux disease without esophagitis: Secondary | ICD-10-CM

## 2023-09-20 DIAGNOSIS — Z884 Allergy status to anesthetic agent status: Secondary | ICD-10-CM | POA: Diagnosis not present

## 2023-09-20 DIAGNOSIS — Z860100 Personal history of colon polyps, unspecified: Secondary | ICD-10-CM | POA: Diagnosis not present

## 2023-09-20 DIAGNOSIS — K635 Polyp of colon: Secondary | ICD-10-CM | POA: Diagnosis not present

## 2023-09-20 MED ORDER — SODIUM CHLORIDE 0.9 % IV SOLN: 500.0000 mL | INTRAVENOUS | Status: DC

## 2023-09-20 NOTE — Progress Notes (Signed)
 Drowsy, VSS, resps reg and even. Report to RN

## 2023-09-20 NOTE — Patient Instructions (Addendum)
 I did not find any cause for swallowing problems on this exam. I believe it is from the osteophytes or projections from the cervical spine pressing on the esophagus as we discussed.  You should continue to modify diet and see if you can cut or crush large pills. Also would consider dental extractions and dentures.  Colonoscopy showed one tiny polyp that I removed. It will be analyzed.  I will let you know pathology results and when to have another routine colonoscopy by mail and/or My Chart.  I appreciate the opportunity to care for you. Iva Boop, MD, FACG   YOU HAD AN ENDOSCOPIC PROCEDURE TODAY AT THE [facility]:   Refer to the procedure report that was given to you for any specific questions about what was found during the examination.  If the procedure report does not answer your questions, please call your gastroenterologist to clarify.  If you requested that your care partner not be given the details of your procedure findings, then the procedure report has been included in a sealed envelope for you to review at your convenience later.  YOU SHOULD EXPECT: Some feelings of bloating in the abdomen. Passage of more gas than usual.  Walking can help get rid of the air that was put into your GI tract during the procedure and reduce the bloating. If you had a lower endoscopy (such as a colonoscopy or flexible sigmoidoscopy) you may notice spotting of blood in your stool or on the toilet paper. If you underwent a bowel prep for your procedure, you may not have a normal bowel movement for a few days.  Please Note:  You might notice some irritation and congestion in your nose or some drainage.  This is from the oxygen used during your procedure.  There is no need for concern and it should clear up in a day or so.  SYMPTOMS TO REPORT IMMEDIATELY:  Following lower endoscopy (colonoscopy or flexible sigmoidoscopy):  Excessive amounts of blood in the stool  Significant tenderness  or worsening of abdominal pains  Swelling of the abdomen that is new, acute  Fever of 100F or higher  Following upper endoscopy (EGD)  Vomiting of blood or coffee ground material  New chest pain or pain under the shoulder blades  Painful or persistently difficult swallowing  New shortness of breath  Fever of 100F or higher  Black, tarry-looking stools  For urgent or emergent issues, a gastroenterologist can be reached at any hour by calling [phone]. Do not use MyChart messaging for urgent concerns.    DIET:  We do recommend a small meal at first, but then you may proceed to your regular diet.  Drink plenty of fluids but you should avoid alcoholic beverages for 24 hours.  ACTIVITY:  You should plan to take it easy for the rest of today and you should NOT DRIVE or use heavy machinery until tomorrow (because of the sedation medicines used during the test).    FOLLOW UP: Our staff will call the number listed on your records the next business day following your procedure.  We will call around 7:15- 8:00 am to check on you and address any questions or concerns that you may have regarding the information given to you following your procedure. If we do not reach you, we will leave a message.     If any biopsies were taken you will be contacted by phone or by letter within the next 1-3 weeks.  Please call us  at [phone] if you have not heard about the biopsies in 3 weeks.    SIGNATURES/CONFIDENTIALITY: You and/or your care partner have signed paperwork which will be entered into your electronic medical record.  These signatures attest to the fact that that the information above on your After Visit Summary has been reviewed and is understood.  Full responsibility of the confidentiality of this discharge information lies with you and/or your care-partner.

## 2023-09-20 NOTE — Op Note (Signed)
 [name] Procedure Date: 09/20/2023 1:15 PM MRN: [id number] Endoscopist: Iva Boop , MD, [id number] Age: 68 Referring MD:  Date of Birth: February 12, 1956 Gender: Male Account #: [id number] Procedure:                Colonoscopy Indications:              High risk colon cancer surveillance: Personal                            history of colonic polyps Medicines:                Monitored Anesthesia Care Procedure:                Pre-Anesthesia Assessment:                           - Prior to the procedure, a History and Physical                            was performed, and patient medications and                            allergies were reviewed. The patient's tolerance of                            previous anesthesia was also reviewed. The risks                            and benefits of the procedure and the sedation                            options and risks were discussed with the patient.                            All questions were answered, and informed consent                            was obtained. Prior Anticoagulants: The patient has                            taken no anticoagulant or antiplatelet agents. ASA                            Grade Assessment: III - A patient with severe                            systemic disease. After reviewing the risks and                            benefits, the patient was deemed in satisfactory                            condition to undergo the procedure.                           -  Prior to the procedure, a History and Physical                            was performed, and patient medications and                            allergies were reviewed. The patient's tolerance of                            previous anesthesia was also reviewed. The risks                            and benefits of the procedure and the sedation                            options and risks were discussed with the  patient.                            All questions were answered, and informed consent                            was obtained. Prior Anticoagulants: The patient has                            taken no anticoagulant or antiplatelet agents. ASA                            Grade Assessment: III - A patient with severe                            systemic disease. After reviewing the risks and                            benefits, the patient was deemed in satisfactory                            condition to undergo the procedure.                           After obtaining informed consent, the colonoscope                            was passed under direct vision. Throughout the                            procedure, the patient's blood pressure, pulse, and                            oxygen saturations were monitored continuously. The                            CF [id number] #[id number] was introduced through the anus  and advanced to the the ileocolonic anastomosis.                            The colonoscopy was performed without difficulty.                            The patient tolerated the procedure well. The                            quality of the bowel preparation was good. The                            rectum and Ileocolonic anastomsis areas were                            photographed. Scope In: 1:43:42 PM Scope Out: 1:55:42 PM Scope Withdrawal Time: 0 hours 9 minutes 32 seconds  Total Procedure Duration: 0 hours 12 minutes 0 seconds  Findings:                 The perianal and digital rectal examinations were                            normal.                           A diminutive polyp was found in the rectum. The                            polyp was sessile. The polyp was removed with a                            cold snare. Resection and retrieval were complete.                            Verification of patient identification for the                             specimen was done. Estimated blood loss was minimal.                           There was evidence of a prior end-to-side                            ileo-colonic anastomosis in the ascending colon.                            This was patent and was characterized by healthy                            appearing mucosa.                           The exam was otherwise without abnormality on  direct and retroflexion views. Complications:            No immediate complications. Estimated Blood Loss:     Estimated blood loss was minimal. Impression:               - One diminutive polyp in the rectum, removed with                            a cold snare. Resected and retrieved.                           - Patent end-to-side ileo-colonic anastomosis,                            characterized by healthy appearing mucosa.                           - The examination was otherwise normal on direct                            and retroflexion views.                           - Personal history of colonic polyp - diminutive                            cecal adenoma removed at colonoscopy 08/2016 Recommendation:           - Patient has a contact number available for                            emergencies. The signs and symptoms of potential                            delayed complications were discussed with the                            patient. Return to normal activities tomorrow.                            Written discharge instructions were provided to the                            patient.                           - Resume previous diet.                           - Continue present medications.                           - See the other procedure note for documentation of                            additional recommendations.                           -  Repeat colonoscopy is recommended for                            surveillance. The colonoscopy date will be                             determined after pathology results from today's                            exam become available for review. Kenney Peacemaker, MD 09/20/2023 2:11:54 PM This report has been signed electronically.

## 2023-09-20 NOTE — Op Note (Signed)
 [facility] Patient Name: Donald [name] Procedure Date: 09/20/2023 1:15 PM MRN: [id number] Endoscopist: Iva Boop , MD, [id number] Age: 68 Referring MD:  Date of Birth: July 24, 1955 Gender: Male Account #: [id number] Procedure:                Upper GI endoscopy Indications:              Dysphagia Medicines:                Monitored Anesthesia Care Procedure:                Pre-Anesthesia Assessment:                           - Prior to the procedure, a History and Physical                            was performed, and patient medications and                            allergies were reviewed. The patient's tolerance of                            previous anesthesia was also reviewed. The risks                            and benefits of the procedure and the sedation                            options and risks were discussed with the patient.                            All questions were answered, and informed consent                            was obtained. Prior Anticoagulants: The patient has                            taken no anticoagulant or antiplatelet agents. ASA                            Grade Assessment: III - A patient with severe                            systemic disease. After reviewing the risks and                            benefits, the patient was deemed in satisfactory                            condition to undergo the procedure.                           After obtaining informed consent, the endoscope was  passed under direct vision. Throughout the                            procedure, the patient's blood pressure, pulse, and                            oxygen saturations were monitored continuously. The                            Olympus scope [id number] was introduced through the                            mouth, and advanced to the second part of duodenum.                            The upper GI endoscopy was accomplished  without                            difficulty. The patient tolerated the procedure                            well. Scope In: Scope Out: Findings:                 The esophagus was normal.                           The stomach was normal.                           The examined duodenum was normal.                           The cardia and gastric fundus were normal on                            retroflexion. Complications:            No immediate complications. Estimated Blood Loss:     Estimated blood loss: none. Impression:               - Normal esophagus.                           - Normal stomach.                           - Normal examined duodenum.                           - No specimens collected. Recommendation:           - Patient has a contact number available for                            emergencies. The signs and symptoms of potential  delayed complications were discussed with the                            patient. Return to normal activities tomorrow.                            Written discharge instructions were provided to the                            patient.                           - Resume previous diet.                           - Continue present medications.                           - See the other procedure note for documentation of                            additional recommendations.                           - Dysphagia is mainly to pills. I conclude its from                            C spine osteophytes. He is also missing many teeth                            and those that remain in poor repair.                           Try to curt or crush large pills.                           Continue diet modifications he already employs to                            avoid dysphagia with food.                           Consider dental extractions and dentures if that is                            feasible. Kenney Peacemaker,  MD 09/20/2023 2:04:26 PM This report has been signed electronically.

## 2023-09-20 NOTE — Progress Notes (Signed)
 Called to room to assist during endoscopic procedure.  Patient ID and intended procedure confirmed with present staff. Received instructions for my participation in the procedure from the performing physician.

## 2023-09-24 ENCOUNTER — Telehealth: Payer: Self-pay

## 2023-09-24 NOTE — Telephone Encounter (Signed)
 Attempted to make post-procedure f/u call. No answer. Unable to leave message.

## 2023-09-25 ENCOUNTER — Encounter: Payer: Self-pay | Admitting: Internal Medicine

## 2023-09-25 LAB — SURGICAL PATHOLOGY

## 2023-09-27 ENCOUNTER — Other Ambulatory Visit
Admission: RE | Admit: 2023-09-27 | Discharge: 2023-09-27 | Disposition: A | Discharge status: Home / Self Care | Payer: Self-pay | Source: Ambulatory Visit | Attending: Medical Genetics | Admitting: Medical Genetics

## 2023-09-27 DIAGNOSIS — Z006 Encounter for examination for normal comparison and control in clinical research program: Secondary | ICD-10-CM | POA: Insufficient documentation

## 2023-10-02 DIAGNOSIS — M961 Postlaminectomy syndrome, not elsewhere classified: Secondary | ICD-10-CM | POA: Diagnosis not present

## 2023-10-02 DIAGNOSIS — G894 Chronic pain syndrome: Secondary | ICD-10-CM | POA: Diagnosis not present

## 2023-10-09 LAB — GENECONNECT MOLECULAR SCREEN: Genetic Analysis Overall Interpretation: NEGATIVE

## 2023-10-15 ENCOUNTER — Ambulatory Visit (HOSPITAL_COMMUNITY): Payer: Medicare HMO | Attending: Cardiology

## 2023-10-15 ENCOUNTER — Other Ambulatory Visit: Payer: Medicare HMO

## 2023-10-31 ENCOUNTER — Encounter: Payer: Self-pay | Admitting: Physician Assistant

## 2023-10-31 ENCOUNTER — Ambulatory Visit (INDEPENDENT_AMBULATORY_CARE_PROVIDER_SITE_OTHER): Admitting: Physician Assistant

## 2023-10-31 VITALS — BP 125/79 | HR 78

## 2023-10-31 DIAGNOSIS — L409 Psoriasis, unspecified: Secondary | ICD-10-CM

## 2023-10-31 MED ORDER — HYDROCORTISONE 2.5 % EX CREA: TOPICAL_CREAM | CUTANEOUS | 2 refills | Status: DC

## 2023-10-31 MED ORDER — TRIAMCINOLONE ACETONIDE 0.1 % EX OINT: TOPICAL_OINTMENT | CUTANEOUS | 1 refills | Status: DC

## 2023-10-31 NOTE — Patient Instructions (Signed)

## 2023-10-31 NOTE — Progress Notes (Signed)
   New Patient Visit   Subjective  Donald [name] is a 68 y.o. male who presents for the following: Rash  Patient states he  has rash located at the face that he  would like to have examined. Patient reports the areas have been there for 2 months. He reports the areas are bothersome.Patient rates irritation 8 out of 10. He states that the areas have not spread.   The following portions of the chart were reviewed this encounter and updated as appropriate: medications, allergies, medical history  Review of Systems:  No other skin or systemic complaints except as noted in HPI or Assessment and Plan.  Objective  Well appearing patient in no apparent distress; mood and affect are within normal limits.   A focused examination was performed of the following areas: Face, scalp, ears, elbows, chest, and knees.   Relevant exam findings are noted in the Assessment and Plan.          Assessment & Plan   PSORIASIS Exam: erythematous scaly patches (see photos)   patient denies joint pain  Psoriasis is a chronic non-curable, but treatable genetic/hereditary disease that may have other systemic features affecting other organ systems such as joints (Psoriatic Arthritis). It is associated with an increased risk of inflammatory bowel disease, heart disease, non-alcoholic fatty liver disease, and depression.  Treatments include light and laser treatments; topical medications; and systemic medications including oral and injectables.  Treatment Plan: - Prescribe hydrocortisone cream as directed.  - Prescribe Triamcinolone  ointment as directed.  - Patient will return for a 6 week follow up for psoriasis      PSORIASIS    Return in about 6 weeks (around 12/12/2023) for psoriasis.  Exie Holler, CMA, am acting as scribe for Lyndsee Casa K, PA-C.   Documentation: I have reviewed the above documentation for accuracy and completeness, and I agree with the above.  Jayzen Paver  K, PA-C

## 2023-11-06 ENCOUNTER — Encounter: Payer: Self-pay | Admitting: Primary Care

## 2023-11-06 ENCOUNTER — Ambulatory Visit (INDEPENDENT_AMBULATORY_CARE_PROVIDER_SITE_OTHER): Admitting: Primary Care

## 2023-11-06 VITALS — BP 102/60 | HR 66 | Temp 97.3°F | Ht 70.0 in | Wt 240.0 lb

## 2023-11-06 DIAGNOSIS — H00012 Hordeolum externum right lower eyelid: Secondary | ICD-10-CM | POA: Diagnosis not present

## 2023-11-06 MED ORDER — ERYTHROMYCIN 5 MG/GM OP OINT: 1.0000 | TOPICAL_OINTMENT | Freq: Every day | OPHTHALMIC | 0 refills | Status: DC

## 2023-11-06 NOTE — Assessment & Plan Note (Signed)
 With irritation.   Start erythromycin ophthalmic ointment at bedtime x 5-7 days. He will set up with a new eye doctor. Follow-up with PCP if no improvement.

## 2023-11-06 NOTE — Patient Instructions (Signed)
 Start erythromycin antibiotic eye ointment.  Apply the thin layer into the palm of the eye every night at bedtime for 5 to 7 days.  Continue warm compresses.  Follow-up with Dr. Rochelle Chu if no improvement.  It was a pleasure to see you today!
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
## 2023-11-09 ENCOUNTER — Ambulatory Visit: Payer: Self-pay

## 2023-11-09 NOTE — Telephone Encounter (Signed)
 FYI Only or Action Required?: Action required by provider  Patient was last seen in primary care on 11/06/2023 by Gabriel John, NP. Called Nurse Triage reporting Eye Problem. Symptoms began yesterday. Interventions attempted: Other: stopped ointments. Symptoms are: gradually improving.  Triage Disposition: Go to ED Now (Notify PCP)  Patient/caregiver understands and will follow disposition?: No, refuses disposition    Copied from CRM [id number]. Topic: Clinical - Red Word Triage >> Nov 09, 2023 10:11 AM Donald [name] wrote: Red Word that prompted transfer to Nurse Triage: Patients believes Medication erythromycin ophthalmic ointment too potent, interfering with vision (beyond blurred) Reason for Disposition  Complete loss of vision in one or both eyes  Answer Assessment - Initial Assessment Questions 1. DESCRIPTION: "How has your vision changed?" (e.g., complete vision loss, blurred vision, double vision, floaters, etc.)     Blurry and double, brief loss of vision after eye ointment that he feels is too strong.  2. LOCATION: "One or both eyes?" If one, ask: "Which eye?"     both 3. SEVERITY: "Can you see anything?" If Yes, ask: "What can you see?" (e.g., fine print)     Yes, sees clear now 4. ONSET: "When did this begin?" "Did it start suddenly or has this been gradual?"     11/08/23 5. PATTERN: "Does this come and go, or has it been constant since it started?"     Constant 6. PAIN: "Is there any pain in your eye(s)?"  (Scale 1-10; or mild, moderate, severe)   - NONE (0): No pain.   - MILD (1-3): Doesn't interfere with normal activities.   - MODERATE (4-7): Interferes with normal activities or awakens from sleep.    - SEVERE (8-10): Excruciating pain, unable to do any normal activities.     na      8. CAUSE: "What do you think is causing this visual problem?"     ointment 9. OTHER SYMPTOMS: "Do you have any other symptoms?" (e.g., confusion, headache, arm or leg weakness, speech  problems)     na Triamcinolone  for face rash, erythromycin  Protocols used: Vision Loss or Change-A-AH

## 2023-11-12 ENCOUNTER — Other Ambulatory Visit: Payer: Self-pay | Admitting: Internal Medicine

## 2023-11-12 DIAGNOSIS — H0012 Chalazion right lower eyelid: Secondary | ICD-10-CM | POA: Diagnosis not present

## 2023-11-12 DIAGNOSIS — N201 Calculus of ureter: Secondary | ICD-10-CM

## 2023-11-12 DIAGNOSIS — N401 Enlarged prostate with lower urinary tract symptoms: Secondary | ICD-10-CM

## 2023-11-14 NOTE — Telephone Encounter (Signed)
 Attempted to reach patient, LVM

## 2023-11-15 ENCOUNTER — Ambulatory Visit: Payer: Self-pay | Admitting: Cardiology

## 2023-11-26 ENCOUNTER — Other Ambulatory Visit: Payer: Self-pay

## 2023-11-26 DIAGNOSIS — I10 Essential (primary) hypertension: Secondary | ICD-10-CM

## 2023-11-26 MED ORDER — HYDROCHLOROTHIAZIDE 25 MG PO TABS: 25.0000 mg | ORAL_TABLET | Freq: Every morning | ORAL | 0 refills | Status: DC

## 2023-12-11 ENCOUNTER — Encounter: Payer: Self-pay | Admitting: Physician Assistant

## 2023-12-11 ENCOUNTER — Ambulatory Visit: Admitting: Physician Assistant

## 2023-12-11 VITALS — BP 130/83 | HR 86

## 2023-12-11 DIAGNOSIS — L409 Psoriasis, unspecified: Secondary | ICD-10-CM | POA: Diagnosis not present

## 2023-12-11 NOTE — Patient Instructions (Signed)

## 2023-12-11 NOTE — Progress Notes (Signed)
   Follow-Up Visit   Subjective  Donald [name] is a 68 y.o. male who presents for the following: follow up of his psoriasis. See prior encounter.    He was given hydrocortisone  and triamcinolone . He said it cleared things up though he gets an itch on and off. He is continuing to use the hydrocortisone  2.5% as directed.    The following portions of the chart were reviewed this encounter and updated as appropriate: medications, allergies, medical history  Review of Systems:  No other skin or systemic complaints except as noted in HPI or Assessment and Plan.  Objective  Well appearing patient in no apparent distress; mood and affect are within normal limits.  A focused examination was performed of the following areas: face  Relevant exam findings are noted in the Assessment and Plan.    Assessment & Plan   PSORIASIS  Exam: clear today  Chronic and persistent condition with duration or expected duration over one year. Condition is improving with treatment but not currently at goal.     Treatment Plan: Continue hydrocortisone  2.5% every other day to increase to daily if flaring Continue triamcinolone  as directed prn for flare  PSORIASIS    Return if symptoms worsen or fail to improve.  I, Darice Smock, CMA, am acting as scribe for [employer], PA-C.   Documentation: I have reviewed the above documentation for accuracy and completeness, and I agree with the above.  Pope Brunty K, PA-C

## 2023-12-12 ENCOUNTER — Encounter: Payer: Self-pay | Admitting: Cardiology

## 2023-12-12 ENCOUNTER — Ambulatory Visit: Attending: Cardiology | Admitting: Cardiology

## 2023-12-12 VITALS — BP 106/70 | HR 71 | Resp 16 | Ht 70.0 in | Wt 241.2 lb

## 2023-12-12 DIAGNOSIS — E6609 Other obesity due to excess calories: Secondary | ICD-10-CM

## 2023-12-12 DIAGNOSIS — G4733 Obstructive sleep apnea (adult) (pediatric): Secondary | ICD-10-CM | POA: Diagnosis not present

## 2023-12-12 DIAGNOSIS — E1165 Type 2 diabetes mellitus with hyperglycemia: Secondary | ICD-10-CM | POA: Diagnosis not present

## 2023-12-12 DIAGNOSIS — Z6834 Body mass index (BMI) 34.0-34.9, adult: Secondary | ICD-10-CM | POA: Diagnosis not present

## 2023-12-12 DIAGNOSIS — E782 Mixed hyperlipidemia: Secondary | ICD-10-CM

## 2023-12-12 DIAGNOSIS — E66811 Obesity, class 1: Secondary | ICD-10-CM

## 2023-12-12 DIAGNOSIS — Z87891 Personal history of nicotine dependence: Secondary | ICD-10-CM

## 2023-12-12 DIAGNOSIS — I10 Essential (primary) hypertension: Secondary | ICD-10-CM | POA: Diagnosis not present

## 2023-12-12 MED ORDER — ROSUVASTATIN CALCIUM 10 MG PO TABS: 10.0000 mg | ORAL_TABLET | Freq: Every day | ORAL | Status: DC

## 2023-12-12 NOTE — Progress Notes (Signed)
 Cardiology Office Note:  .   Date:  12/12/2023  ID:  Donald [name], DOB 1956-02-25, MRN [id number] PCP:  Joshua Debby CROME, MD  Former Cardiology Providers: None [facility] Providers Cardiologist:  Donald Large, Donald [name] , [facility] (established care 11/19/2020) Electrophysiologist:  None  Click to update primary MD,subspecialty MD or APP then REFRESH:1}    Chief Complaint  Patient presents with   Hypertension   Follow-up    History of Present Illness: .   Donald [name] is a 68 y.o. African-American male whose past medical history and cardiovascular risk factors includes: Benign essential hypertension, diabetes, Hx of COVID, obesity due to excess calorie, OSA on CPAP, former smoker.   Referred to the practice for evaluation of left ventricular hypertrophy.  Underwent diagnostic workup as outlined below.  His ventricular hypertrophy likely secondary to uncontrolled hypertension.  His antihypertensive medications were uptitrated and since then his blood pressures have improved significantly.  He also was encouraged to undergo sleep study to evaluate for obstructive or central sleep apnea.  He follows up with Dr. Chalice and started using his CPAP around the last office visit and felt less tired, fatigued, and had more energy.  He presents today for 26-month follow-up visit  Over the last 6 months patient denies any anginal chest pain or heart failure symptoms.  Patient states that he has been experiencing soft blood pressures and therefore his PCP had discontinued losartan  25 mg p.o. daily as well as Crestor  10 mg p.o. daily approximately 4 months ago.  His home blood pressures on HCTZ averages between 118-120 mmHg.  He is also on Mounjaro  to help facilitate weight loss given his history of diabetes.  However, he has been gaining weight due to dietary indiscretion.  Review of Systems: .   Review of Systems  Cardiovascular:  Negative for chest pain, claudication, irregular heartbeat, leg  swelling, near-syncope, orthopnea, palpitations, paroxysmal nocturnal dyspnea and syncope.  Respiratory:  Negative for shortness of breath.   Hematologic/Lymphatic: Negative for bleeding problem.    Studies Reviewed:   EKG: EKG Interpretation Date/Time:  Wednesday December 12 2023 16:22:59 EDT Ventricular Rate:  70 PR Interval:  204 QRS Duration:  78 QT Interval:  400 QTC Calculation: 432 R Axis:   -6  Text Interpretation: Normal sinus rhythm Cannot rule out Anterior infarct (cited on or before 25-Mar-2021) When compared with ECG of 25-Mar-2021 15:50, No significant change was found Confirmed by [name] Donald [phone]) on 12/12/2023 4:28:47 PM  Echocardiogram: 12/01/2020: Normal LV systolic function with visual EF 55-60%. Left ventricle cavity is normal in size. Moderate concentric hypertrophy of the left ventricle. Normal global wall motion. Doppler evidence of grade I (impaired) diastolic dysfunction, normal LAP. Left atrial cavity is mildly dilated.   RADIOLOGY: NA  Risk Assessment/Calculations:   NA   Labs:       Latest Ref Rng & Units 08/29/2023    1:43 PM 04/27/2023    9:45 PM 04/24/2023    4:31 PM  CBC  WBC 4.0 - 10.5 K/uL 7.0  7.4  8.6   Hemoglobin 13.0 - 17.0 g/dL 85.3  86.8  86.3   Hematocrit 39.0 - 52.0 % 44.1  41.1  42.2   Platelets 150.0 - 400.0 K/uL 189.0  199  192.0        Latest Ref Rng & Units 08/29/2023    1:43 PM 04/27/2023    9:45 PM 04/24/2023    4:31 PM  BMP  Glucose 70 - 99 mg/dL  104  90  95   BUN 6 - 23 mg/dL 18  20  22    Creatinine 0.40 - 1.50 mg/dL 9.08  9.02  9.05   Sodium 135 - 145 mEq/L 139  140  141   Potassium 3.5 - 5.1 mEq/L 3.9  3.6  3.8   Chloride 96 - 112 mEq/L 99  100  100   CO2 19 - 32 mEq/L 34  31  33   Calcium  8.4 - 10.5 mg/dL 9.8  9.0  9.5       Latest Ref Rng & Units 08/29/2023    1:43 PM 04/27/2023    9:45 PM 04/24/2023    4:31 PM  CMP  Glucose 70 - 99 mg/dL 895  90  95   BUN 6 - 23 mg/dL 18  20  22    Creatinine 0.40 -  1.50 mg/dL 9.08  9.02  9.05   Sodium 135 - 145 mEq/L 139  140  141   Potassium 3.5 - 5.1 mEq/L 3.9  3.6  3.8   Chloride 96 - 112 mEq/L 99  100  100   CO2 19 - 32 mEq/L 34  31  33   Calcium  8.4 - 10.5 mg/dL 9.8  9.0  9.5   Total Protein 6.0 - 8.3 g/dL 7.9     Total Bilirubin 0.2 - 1.2 mg/dL 0.4     Alkaline Phos 39 - 117 U/L 47     AST 0 - 37 U/L 20     ALT 0 - 53 U/L 20       Lab Results  Component Value Date   CHOL 129 11/30/2022   HDL 57.60 11/30/2022   LDLCALC 52 11/30/2022   TRIG 96.0 11/30/2022   CHOLHDL 2 11/30/2022   No results for input(s): LIPOA in the last 8760 hours. No components found for: NTPROBNP No results for input(s): PROBNP in the last 8760 hours. Recent Labs    01/23/23 1613  TSH 0.92    Physical Exam:    Today's Vitals   12/12/23 1607  BP: 106/70  Pulse: 71  Resp: 16  SpO2: 94%  Weight: 241 lb 3.2 oz (109.4 kg)  Height: 5' 10 (1.778 m)   Body mass index is 34.61 kg/m. Wt Readings from Last 3 Encounters:  12/12/23 241 lb 3.2 oz (109.4 kg)  11/06/23 240 lb (108.9 kg)  09/20/23 215 lb (97.5 kg)    Physical Exam  Constitutional: No distress.  hemodynamically stable  Neck: No JVD present.  Cardiovascular: Normal rate, regular rhythm, S1 normal and S2 normal. Exam reveals no gallop, no S3 and no S4.  No murmur heard. Pulmonary/Chest: Effort normal and breath sounds normal. No stridor. He has no wheezes. He has no rales.  Musculoskeletal:        General: No edema.     Cervical back: Neck supple.  Skin: Skin is warm.     Impression & Recommendation(s):  Impression:   ICD-10-CM   1. Benign hypertension  I10 EKG 12-Lead    2. OSA on CPAP  G47.33     3. Type 2 diabetes mellitus with hyperglycemia, without long-term current use of insulin  (HCC)  E11.65     4. Mixed hyperlipidemia  E78.2 rosuvastatin  (CRESTOR ) 10 MG tablet    5. Former smoker  Z87.891     6. Class 1 obesity due to excess calories with serious comorbidity and  body mass index (BMI) of 34.0 to 34.9 in adult  E66.811  E66.09    Z68.34        Recommendation(s):  Benign hypertension Office blood pressures are soft but asymptomatic at this time. Currently on hydrochlorothiazide  25 mg p.o. every morning. Discontinued losartan  25 mg p.o. daily approximately 4 months ago due to lightheadedness. Patient requesting my opinion with regards to his antihypertensive medications. Given his home blood pressure readings are well-controlled would favor continuing losartan  25 mg p.o. daily and discontinuation of hydrochlorothiazide  given his underlying diabetes as ARB's are renal protective. I have asked him to follow-up with PCP prior to making the change so that everyone is agreeable with the plan of care. Reemphasized importance of low-salt diet.  OSA on CPAP Endorses compliance with his CPAP on a regular basis. Follows with Dr. Chalice  Type 2 diabetes mellitus with hyperglycemia, without long-term current use of insulin  [facility]) Reemphasize importance of glycemic control. Most recent hemoglobin A1c is 6.1 as of March 2025. Recommend ARB over HCTZ. Currently on Mounjaro   Mixed hyperlipidemia Discontinued Crestor  10 mg p.o. daily approximately 4 months ago secondary to lightheadedness. Given his underlying diabetes recommend a goal LDL of <70 mg/dL. Recommended restarting Crestor  10 mg p.o. daily  Class 1 obesity due to excess calories with serious comorbidity and body mass index (BMI) of 34.0 to 34.9 in adult Unfortunately continues to gain weight despite being on Mounjaro . Patient states that it is likely secondary to dietary indiscretion, sweet tooth Patient is making a conscious effort with regards to weight loss. Body mass index is 34.61 kg/m. I reviewed with him importance of diet, regular physical activity/exercise, weight loss.   Patient is educated on the importance of increasing physical activity gradually as tolerated with a goal of  moderate intensity exercise for 30 minutes a day 5 days a week.  Medical decision making: Discussed management of at least 2 chronic comorbid conditions. Independently ordered and reviewed EKG Prescription drug management. Relatively stable from a cardiovascular standpoint.  Will defer and back to PCP for management of his chronic comorbid conditions.  Recommend follow-up on as needed basis.  Patient agreeable with the plan of care.  Orders Placed:  Orders Placed This Encounter  Procedures   EKG 12-Lead     Final Medication List:    Meds ordered this encounter  Medications   rosuvastatin  (CRESTOR ) 10 MG tablet    Sig: Take 1 tablet (10 mg total) by mouth daily.    Medications Discontinued During This Encounter  Medication Reason   bisacodyl  5 MG EC tablet Patient Preference     Current Outpatient Medications:    b complex-vitamin c-folic acid  (NEPHRO-VITE) 0.8 MG TABS tablet, Take by mouth., Disp: , Rfl:    buprenorphine -naloxone  (SUBOXONE ) 8-2 mg SUBL SL tablet, Place 1 tablet under the tongue daily., Disp: , Rfl:    Cholecalciferol  (VITAMIN D3) 125 MCG (5000 UT) CAPS, Take 5,000 Units by mouth in the morning and at bedtime., Disp: , Rfl:    Cyanocobalamin  (B-12 PO), Take 4 drops by mouth daily., Disp: , Rfl:    erythromycin  ophthalmic ointment, Place 1 Application into the right eye at bedtime., Disp: 3.5 g, Rfl: 0   fluticasone  (FLONASE ) 50 MCG/ACT nasal spray, Place 2 sprays into both nostrils daily as needed for allergies or rhinitis., Disp: 16 each, Rfl: 3   Folic Acid -Vit B6-Vit B12 0.5-5-0.2 MG TABS, Take 1 tablet by mouth daily. B6 Complex, Disp: , Rfl:    hydrochlorothiazide  (HYDRODIURIL ) 25 MG tablet, Take 1 tablet (25 mg total) by mouth every morning.,  Disp: 90 tablet, Rfl: 0   hydrocortisone  2.5 % cream, Apply to affected area for two weeks, Disp: 60 g, Rfl: 2   hydrOXYzine  (ATARAX ) 10 MG tablet, Take 1 tablet (10 mg total) by mouth 3 (three) times daily as needed.,  Disp: 90 tablet, Rfl: 2   meloxicam  (MOBIC ) 15 MG tablet, Take 1 tablet by mouth once daily, Disp: 90 tablet, Rfl: 0   MOUNJARO  7.5 MG/0.5ML Pen, INJECT 7.5 MG INTO THE SKIN ONCE A WEEK, Disp: 12 mL, Rfl: 0   Multiple Vitamin (MULTI-VITAMIN) tablet, Take 1 tablet by mouth daily., Disp: , Rfl:    naloxone  (NARCAN ) nasal spray 4 mg/0.1 mL, Place 1 spray into the nose as needed., Disp: , Rfl:    NON FORMULARY, Take 5 drops by mouth daily. Vitamin A,D,K daily, Disp: , Rfl:    Omega 3-6-9 Fatty Acids (OMEGA-3-6-9 PO), Take 15 mLs by mouth daily. 15.9 g, Disp: , Rfl:    omeprazole  (PRILOSEC) 20 MG capsule, TAKE 1 CAPSULE BY MOUTH ONCE DAILY AS NEEDED FOR  ACID  REFLUX, Disp: 90 capsule, Rfl: 0   rosuvastatin  (CRESTOR ) 10 MG tablet, Take 1 tablet (10 mg total) by mouth daily., Disp: , Rfl:    tamsulosin  (FLOMAX ) 0.4 MG CAPS capsule, Take 1 capsule by mouth once daily, Disp: 90 capsule, Rfl: 0   triamcinolone  ointment (KENALOG ) 0.1 %, Apply twice a daily to affected area for 2 weeks ( use this one first ), Disp: 60 g, Rfl: 1  Consent:   NA  Disposition:   As needed  His questions and concerns were addressed to his satisfaction. He voices understanding of the recommendations provided during this encounter.    Signed, Donald Michele HAS, [facility]  A Division of [facility] [address] [place], [place] [address]  [place], [place] [address] 12/12/2023 6:17 PM

## 2023-12-12 NOTE — Patient Instructions (Addendum)
 Medication Instructions:  RESTART Crestor  10 mg daily  *If you need a refill on your cardiac medications before your next appointment, please call your pharmacy*   Follow-Up: At [facility], you and your health needs are our priority.  As part of our continuing mission to provide you with exceptional heart care, our providers are all part of one team.  This team includes your primary Cardiologist (physician) and Advanced Practice Providers or APPs (Physician Assistants and Nurse Practitioners) who all work together to provide you with the care you need, when you need it.  Your next appointment:   AS NEEDED   Provider:   Madonna Large, DO    We recommend signing up for the patient portal called MyChart.  Sign up information is provided on this After Visit Summary.  MyChart is used to connect with patients for Virtual Visits (Telemedicine).  Patients are able to view lab/test results, encounter notes, upcoming appointments, etc.  Non-urgent messages can be sent to your provider as well.   To learn more about what you can do with MyChart, go to [email or web address].

## 2023-12-31 ENCOUNTER — Other Ambulatory Visit: Payer: Self-pay | Admitting: Internal Medicine

## 2023-12-31 DIAGNOSIS — E119 Type 2 diabetes mellitus without complications: Secondary | ICD-10-CM

## 2024-01-06 ENCOUNTER — Other Ambulatory Visit: Payer: Self-pay | Admitting: Internal Medicine

## 2024-01-06 DIAGNOSIS — E119 Type 2 diabetes mellitus without complications: Secondary | ICD-10-CM

## 2024-01-08 NOTE — Telephone Encounter (Signed)
 Last OV 08/09/23 Next OV 08/26/24  Last refill 01/05/24 Qty #6 mL / 0

## 2024-01-22 DIAGNOSIS — M48062 Spinal stenosis, lumbar region with neurogenic claudication: Secondary | ICD-10-CM | POA: Diagnosis not present

## 2024-01-22 DIAGNOSIS — G894 Chronic pain syndrome: Secondary | ICD-10-CM | POA: Diagnosis not present

## 2024-01-22 DIAGNOSIS — M961 Postlaminectomy syndrome, not elsewhere classified: Secondary | ICD-10-CM | POA: Diagnosis not present

## 2024-01-22 DIAGNOSIS — Z79899 Other long term (current) drug therapy: Secondary | ICD-10-CM | POA: Diagnosis not present

## 2024-02-05 DIAGNOSIS — N200 Calculus of kidney: Secondary | ICD-10-CM | POA: Insufficient documentation

## 2024-02-07 DIAGNOSIS — N3281 Overactive bladder: Secondary | ICD-10-CM | POA: Diagnosis not present

## 2024-02-07 DIAGNOSIS — N401 Enlarged prostate with lower urinary tract symptoms: Secondary | ICD-10-CM | POA: Diagnosis not present

## 2024-02-07 DIAGNOSIS — N138 Other obstructive and reflux uropathy: Secondary | ICD-10-CM | POA: Diagnosis not present

## 2024-02-07 DIAGNOSIS — N200 Calculus of kidney: Secondary | ICD-10-CM | POA: Diagnosis not present

## 2024-02-08 ENCOUNTER — Other Ambulatory Visit: Payer: Self-pay | Admitting: Internal Medicine

## 2024-03-03 ENCOUNTER — Other Ambulatory Visit: Payer: Self-pay | Admitting: Internal Medicine

## 2024-03-03 DIAGNOSIS — E119 Type 2 diabetes mellitus without complications: Secondary | ICD-10-CM

## 2024-03-03 DIAGNOSIS — L299 Pruritus, unspecified: Secondary | ICD-10-CM

## 2024-03-17 ENCOUNTER — Other Ambulatory Visit: Payer: Self-pay

## 2024-03-17 DIAGNOSIS — I10 Essential (primary) hypertension: Secondary | ICD-10-CM

## 2024-03-18 MED ORDER — HYDROCHLOROTHIAZIDE 25 MG PO TABS: 25.0000 mg | ORAL_TABLET | Freq: Every morning | ORAL | 2 refills | Status: AC

## 2024-03-26 DIAGNOSIS — M5416 Radiculopathy, lumbar region: Secondary | ICD-10-CM | POA: Diagnosis not present

## 2024-03-26 DIAGNOSIS — Z01812 Encounter for preprocedural laboratory examination: Secondary | ICD-10-CM | POA: Diagnosis not present

## 2024-03-28 ENCOUNTER — Ambulatory Visit: Payer: Self-pay | Admitting: *Deleted

## 2024-03-28 NOTE — Telephone Encounter (Signed)
 FYI Only or Action Required?: Action required by provider: request for appointment, clinical question for provider, and update on patient condition.  Patient was last seen in primary care on 11/06/2023 by Gretta Comer POUR, NP.  Called Nurse Triage reporting Extremity Weakness.  Symptoms began several days ago.  Interventions attempted: Rest, hydration, or home remedies.  Symptoms are: unchanged.  Triage Disposition: See Physician Within 24 Hours  Patient/caregiver understands and will follow disposition?: No, wishes to speak with PCP      Copied from CRM [id number]. Topic: Clinical - Red Word Triage >> Mar 28, 2024  2:50 PM Donald [name] wrote: [facility] that prompted transfer to Nurse Triage:  Pt experiencing weakness in legs. He was able to brace himself before falling. Warm transfer to NT. Reason for Disposition  [1] MODERATE weakness (e.g., interferes with work, school, normal activities) AND [2] persists > 3 days  Answer Assessment - Initial Assessment Questions Offered patient appt today at [facility] with other provider and patient declined. None available with providers at [facility]. Patient reports he only wants to see PCP. Earliest appt scheduled for 04/28/24 that patient could make. Patient reports he has had back issues for a while and leg weakness comes and goes. Recommended if sx worsen go to UC /ED and if fall occurs go to ED for evaluation. Patient requesting if PCP wants him to check blood sugars at home and did not order for a meter. Patient started on Mounjaro  03/08/24. Reviewed with patient s/s of hi and low blood sugars sx and to call back if sx noted. Patient also requesting that he needs refill of hydrodiuril  25 mg. Last ordered by CVD 03/18/24 and recommended patient to call pharmacy. Please clarify future appt for AWV and which if for annual physical. Both listed as AWV 08/26/24 and 08/29/24. Please advise if earlier appt available before Nov. With PCP.        1. DESCRIPTION: Describe how you are feeling.     Bilateral  leg weakness.  2. SEVERITY: How bad is it?  Can you stand and walk?     Pain comes and goes with extended time sitting but  can stand can walk 3. ONSET: When did these symptoms begin? (e.g., hours, days, weeks, months)     Monday lowered self  from night stand when going to bathroom at 2am. Denies falling but did lower self to floor 4. CAUSE: What do you think is causing the weakness or fatigue? (e.g., not drinking enough fluids, medical problem, trouble sleeping)     Bulging disc in back possible  5. NEW MEDICINES:  Have you started on any new medicines recently? (e.g., opioid pain medicines, benzodiazepines, muscle relaxants, antidepressants, antihistamines, neuroleptics, beta blockers)     Epidural completed Wednesday  6. OTHER SYMPTOMS: Do you have any other symptoms? (e.g., chest pain, fever, cough, SOB, vomiting, diarrhea, bleeding, other areas of pain)     Bilateral leg weakness, denies N/T. Hx right hip replacement x 2 years ago   7. PREGNANCY: Is there any chance you are pregnant? When was your last menstrual period?     na  Protocols used: Weakness (Generalized) and Fatigue-A-AH

## 2024-04-02 ENCOUNTER — Other Ambulatory Visit: Payer: Self-pay | Admitting: Internal Medicine

## 2024-04-02 DIAGNOSIS — L299 Pruritus, unspecified: Secondary | ICD-10-CM

## 2024-04-04 NOTE — Telephone Encounter (Signed)
 Unable to reach patient. LMTRC

## 2024-04-10 DIAGNOSIS — N3281 Overactive bladder: Secondary | ICD-10-CM | POA: Diagnosis not present

## 2024-04-10 DIAGNOSIS — N401 Enlarged prostate with lower urinary tract symptoms: Secondary | ICD-10-CM | POA: Diagnosis not present

## 2024-04-10 DIAGNOSIS — N200 Calculus of kidney: Secondary | ICD-10-CM | POA: Diagnosis not present

## 2024-04-10 DIAGNOSIS — N138 Other obstructive and reflux uropathy: Secondary | ICD-10-CM | POA: Diagnosis not present

## 2024-04-10 NOTE — Telephone Encounter (Signed)
 Patient has been scheduled for an appointment.

## 2024-04-11 ENCOUNTER — Telehealth: Payer: Self-pay

## 2024-04-11 NOTE — Telephone Encounter (Signed)
 Copied from CRM [id number]. Topic: General - Call Back - No Documentation >> Apr 11, 2024  3:38 PM [name] wrote: Reason for CRM: Patient returning call from [facility]. Called CAL advised out of the office and to send a message.

## 2024-04-14 NOTE — Telephone Encounter (Signed)
 Unable to reach patient. LMTRC

## 2024-04-16 NOTE — Telephone Encounter (Signed)
 Pt returned phone call and would like a call back to discuss appts with Dr Joshua

## 2024-04-21 NOTE — Telephone Encounter (Signed)
 Patient has been scheduled for an appointment.

## 2024-04-28 ENCOUNTER — Encounter: Payer: Self-pay | Admitting: Internal Medicine

## 2024-04-28 ENCOUNTER — Ambulatory Visit: Admitting: Internal Medicine

## 2024-04-28 VITALS — BP 136/76 | HR 73 | Temp 98.1°F | Resp 16 | Ht 70.0 in | Wt 245.4 lb

## 2024-04-28 DIAGNOSIS — R350 Frequency of micturition: Secondary | ICD-10-CM | POA: Diagnosis not present

## 2024-04-28 DIAGNOSIS — Z0000 Encounter for general adult medical examination without abnormal findings: Secondary | ICD-10-CM | POA: Diagnosis not present

## 2024-04-28 DIAGNOSIS — E782 Mixed hyperlipidemia: Secondary | ICD-10-CM

## 2024-04-28 DIAGNOSIS — K7400 Hepatic fibrosis, unspecified: Secondary | ICD-10-CM

## 2024-04-28 DIAGNOSIS — E785 Hyperlipidemia, unspecified: Secondary | ICD-10-CM

## 2024-04-28 DIAGNOSIS — I10 Essential (primary) hypertension: Secondary | ICD-10-CM

## 2024-04-28 DIAGNOSIS — E1141 Type 2 diabetes mellitus with diabetic mononeuropathy: Secondary | ICD-10-CM | POA: Diagnosis not present

## 2024-04-28 DIAGNOSIS — N401 Enlarged prostate with lower urinary tract symptoms: Secondary | ICD-10-CM | POA: Diagnosis not present

## 2024-04-28 DIAGNOSIS — Z0001 Encounter for general adult medical examination with abnormal findings: Secondary | ICD-10-CM

## 2024-04-28 DIAGNOSIS — R413 Other amnesia: Secondary | ICD-10-CM

## 2024-04-28 LAB — PSA: PSA: 0.93 ng/mL (ref 0.10–4.00)

## 2024-04-28 LAB — CBC WITH DIFFERENTIAL/PLATELET
Basophils Absolute: 0 K/uL (ref 0.0–0.1)
Basophils Relative: 0.5 % (ref 0.0–3.0)
Eosinophils Absolute: 0.1 K/uL (ref 0.0–0.7)
Eosinophils Relative: 0.9 % (ref 0.0–5.0)
HCT: 42.4 % (ref 39.0–52.0)
Hemoglobin: 14 g/dL (ref 13.0–17.0)
Lymphocytes Relative: 26.2 % (ref 12.0–46.0)
Lymphs Abs: 1.9 K/uL (ref 0.7–4.0)
MCHC: 33.1 g/dL (ref 30.0–36.0)
MCV: 89.8 fl (ref 78.0–100.0)
Monocytes Absolute: 0.4 K/uL (ref 0.1–1.0)
Monocytes Relative: 5.8 % (ref 3.0–12.0)
Neutro Abs: 4.9 K/uL (ref 1.4–7.7)
Neutrophils Relative %: 66.6 % (ref 43.0–77.0)
Platelets: 216 K/uL (ref 150.0–400.0)
RBC: 4.72 Mil/uL (ref 4.22–5.81)
RDW: 13.1 % (ref 11.5–15.5)
WBC: 7.3 K/uL (ref 4.0–10.5)

## 2024-04-28 LAB — BASIC METABOLIC PANEL WITH GFR
BUN: 18 mg/dL (ref 6–23)
CO2: 36 meq/L — ABNORMAL HIGH (ref 19–32)
Calcium: 9.5 mg/dL (ref 8.4–10.5)
Chloride: 98 meq/L (ref 96–112)
Creatinine, Ser: 0.96 mg/dL (ref 0.40–1.50)
GFR: 80.58 mL/min (ref >60.00)
Glucose, Bld: 97 mg/dL (ref 70–99)
Potassium: 4.1 meq/L (ref 3.5–5.1)
Sodium: 140 meq/L (ref 135–145)

## 2024-04-28 LAB — HEPATIC FUNCTION PANEL
ALT: 24 U/L (ref 0–53)
AST: 23 U/L (ref 0–37)
Albumin: 4.5 g/dL (ref 3.5–5.2)
Alkaline Phosphatase: 47 U/L (ref 39–117)
Bilirubin, Direct: 0.1 mg/dL (ref 0.0–0.3)
Total Bilirubin: 0.5 mg/dL (ref 0.2–1.2)
Total Protein: 7.4 g/dL (ref 6.0–8.3)

## 2024-04-28 LAB — PROTIME-INR
INR: 1.1 ratio — ABNORMAL HIGH (ref 0.8–1.0)
Prothrombin Time: 11.2 s (ref 9.6–13.1)

## 2024-04-28 LAB — LIPID PANEL
Cholesterol: 186 mg/dL (ref 0–200)
HDL: 58.6 mg/dL (ref >39.00)
LDL Cholesterol: 96 mg/dL (ref 0–99)
NonHDL: 127.61
Total CHOL/HDL Ratio: 3
Triglycerides: 160 mg/dL — ABNORMAL HIGH (ref 0.0–149.0)
VLDL: 32 mg/dL (ref 0.0–40.0)

## 2024-04-28 LAB — TSH: TSH: 0.71 u[IU]/mL (ref 0.35–5.50)

## 2024-04-28 LAB — HEMOGLOBIN A1C: Hgb A1c MFr Bld: 6 % (ref 4.6–6.5)

## 2024-04-28 MED ORDER — COVID-19 MRNA VAC-TRIS(PFIZER) 30 MCG/0.3ML IM SUSY: 0.3000 mL | PREFILLED_SYRINGE | Freq: Once | INTRAMUSCULAR | 0 refills | Status: AC

## 2024-04-28 NOTE — Progress Notes (Signed)
 Subjective:  Patient ID: Donald [name], male    DOB: [birth year]  Age: 68 y.o. MRN: [id number]  CC: Annual Exam, Hypertension, Osteoarthritis, Diabetes, and Hyperlipidemia   HPI Donald [name] presents for a CPX and f/up ---  Discussed the use of AI scribe software for clinical note transcription with the patient, who gave verbal consent to proceed.  History of Present Illness Donald [name] is a 68 year old male who presents with a recent episode of right leg weakness.  Approximately three weeks ago, he experienced a single episode of right leg weakness at around 2:00 AM. Upon attempting to stand from bed, he collapsed as his right leg could not support him. He managed to get up using the nightstand and bed for support and has not experienced any further episodes of weakness since then. No numbness, tingling, slurred speech, or trouble talking was associated with the episode.  He has chronic neck and back pain, which he experiences daily. No pain or swelling in his feet or legs.  He is currently on Mounjaro  for blood sugar management but is unsure if he needs to monitor his blood sugar levels regularly. He experiences belching and gas but no abdominal pain or constipation. He has a history of difficulty swallowing, particularly with large pills.  He reports recent difficulty initiating urination. He is on Flomax  to aid urine flow and denies any pain during urination.  He is not willing to receive a flu shot or pneumonia vaccine but has received COVID vaccines in the past, though not this year. He has not had an eye exam in the last year and is seeking a new eye doctor for routine exams.     Outpatient Medications Prior to Visit  Medication Sig Dispense Refill   b complex-vitamin c-folic acid  (NEPHRO-VITE) 0.8 MG TABS tablet Take by mouth.     buprenorphine -naloxone  (SUBOXONE ) 8-2 mg SUBL SL tablet Place 1 tablet under the tongue daily.     Cholecalciferol  (VITAMIN D3) 125 MCG (5000  UT) CAPS Take 5,000 Units by mouth in the morning and at bedtime.     Cyanocobalamin  (B-12 PO) Take 4 drops by mouth daily.     fluticasone  (FLONASE ) 50 MCG/ACT nasal spray Place 2 sprays into both nostrils daily as needed for allergies or rhinitis. 16 each 3   Folic Acid -Vit B6-Vit B12 0.5-5-0.2 MG TABS Take 1 tablet by mouth daily. B6 Complex     hydrochlorothiazide  (HYDRODIURIL ) 25 MG tablet Take 1 tablet (25 mg total) by mouth every morning. 90 tablet 2   hydrOXYzine  (ATARAX ) 10 MG tablet Take 1 tablet by mouth three times daily as needed 90 tablet 0   meloxicam  (MOBIC ) 15 MG tablet Take 1 tablet by mouth once daily 90 tablet 0   MOUNJARO  7.5 MG/0.5ML Pen INJECT 7.5 MG SUBCUTANEOUSLY ONCE A WEEK 12 mL 0   Multiple Vitamin (MULTI-VITAMIN) tablet Take 1 tablet by mouth daily.     naloxone  (NARCAN ) nasal spray 4 mg/0.1 mL Place 1 spray into the nose as needed.     NON FORMULARY Take 5 drops by mouth daily. Vitamin A,D,K daily     Omega 3-6-9 Fatty Acids (OMEGA-3-6-9 PO) Take 15 mLs by mouth daily. 15.9 g     omeprazole  (PRILOSEC) 20 MG capsule TAKE 1 CAPSULE BY MOUTH ONCE DAILY AS NEEDED FOR  ACID  REFLUX 90 capsule 0   tamsulosin  (FLOMAX ) 0.4 MG CAPS capsule Take 1 capsule by mouth once daily 90 capsule 0  erythromycin  ophthalmic ointment Place 1 Application into the right eye at bedtime. 3.5 g 0   hydrocortisone  2.5 % cream Apply to affected area for two weeks 60 g 2   rosuvastatin  (CRESTOR ) 10 MG tablet Take 1 tablet (10 mg total) by mouth daily. (Patient not taking: Reported on 04/28/2024)     triamcinolone  ointment (KENALOG ) 0.1 % Apply twice a daily to affected area for 2 weeks ( use this one first ) 60 g 1   No facility-administered medications prior to visit.    ROS Review of Systems  Constitutional:  Negative for appetite change, chills, diaphoresis, fatigue and fever.  HENT: Negative.    Eyes: Negative.  Negative for visual disturbance.  Respiratory: Negative.  Negative for  cough, chest tightness, shortness of breath and wheezing.   Cardiovascular:  Negative for chest pain, palpitations and leg swelling.  Gastrointestinal: Negative.  Negative for abdominal pain, blood in stool, constipation, diarrhea, nausea and vomiting.  Genitourinary: Negative.  Negative for difficulty urinating and dysuria.  Musculoskeletal:  Positive for arthralgias and back pain. Negative for myalgias.  Neurological:  Negative for dizziness, weakness and numbness.  Hematological:  Negative for adenopathy. Does not bruise/bleed easily.  Psychiatric/Behavioral:  Positive for confusion and decreased concentration. Negative for self-injury. The patient is not nervous/anxious.     Objective:  BP 136/76 (BP Location: Left Arm, Patient Position: Sitting, Cuff Size: Normal)   Pulse 73   Temp 98.1 F (36.7 C) (Oral)   Resp 16   Ht 5' 10 (1.778 m)   Wt 245 lb 6.4 oz (111.3 kg)   SpO2 98%   BMI 35.21 kg/m   BP Readings from Last 3 Encounters:  04/28/24 136/76  12/12/23 106/70  12/11/23 130/83    Wt Readings from Last 3 Encounters:  04/28/24 245 lb 6.4 oz (111.3 kg)  12/12/23 241 lb 3.2 oz (109.4 kg)  11/06/23 240 lb (108.9 kg)    Physical Exam Vitals reviewed.  Constitutional:      Appearance: Normal appearance.  HENT:     Nose: Nose normal.     Mouth/Throat:     Mouth: Mucous membranes are moist.  Eyes:     General: No scleral icterus.    Conjunctiva/sclera: Conjunctivae normal.  Cardiovascular:     Rate and Rhythm: Normal rate and regular rhythm.     Heart sounds: No murmur heard.    No friction rub. No gallop.  Pulmonary:     Breath sounds: No stridor. No wheezing, rhonchi or rales.  Abdominal:     General: Abdomen is flat.     Palpations: There is no mass.     Tenderness: There is no abdominal tenderness. There is no guarding.     Hernia: No hernia is present.  Musculoskeletal:        General: Normal range of motion.     Cervical back: Neck supple.     Right  lower leg: No edema.     Left lower leg: No edema.  Lymphadenopathy:     Cervical: No cervical adenopathy.  Skin:    General: Skin is warm and dry.     Coloration: Skin is not pale.  Neurological:     General: No focal deficit present.     Mental Status: He is alert. Mental status is at baseline.     Cranial Nerves: Cranial nerves 2-12 are intact.     Sensory: Sensation is intact.     Motor: Motor function is intact.  Coordination: Coordination is intact.     Gait: Gait is intact.     Deep Tendon Reflexes: Reflexes are normal and symmetric.  Psychiatric:        Mood and Affect: Mood normal.        Behavior: Behavior normal.     Lab Results  Component Value Date   WBC 7.3 04/28/2024   HGB 14.0 04/28/2024   HCT 42.4 04/28/2024   PLT 216.0 04/28/2024   GLUCOSE 97 04/28/2024   CHOL 186 04/28/2024   TRIG 160.0 (H) 04/28/2024   HDL 58.60 04/28/2024   LDLCALC 96 04/28/2024   ALT 24 04/28/2024   AST 23 04/28/2024   NA 140 04/28/2024   K 4.1 04/28/2024   CL 98 04/28/2024   CREATININE 0.96 04/28/2024   BUN 18 04/28/2024   CO2 36 (H) 04/28/2024   TSH 0.71 04/28/2024   PSA 0.93 04/28/2024   INR 1.1 (H) 04/28/2024   HGBA1C 6.0 04/28/2024   MICROALBUR <0.7 08/29/2023   Fibrosis 4 Score = 1.5  Fib-4 interpretation is not validated for people under 35 or over 27 years of age. However, scores under 2.0 are generally considered low risk.   Assessment & Plan:   Type 2 diabetes mellitus with diabetic mononeuropathy, without long-term current use of insulin  (HCC)- Blood sugar is well controlled. -     [facility] Diabetes Foot Exam -     Ambulatory referral to Ophthalmology -     Basic metabolic panel with GFR; Future -     Urinalysis, Routine w reflex microscopic; Future -     Hemoglobin A1c; Future -     COVID-19 mRNA Vac-TriS(Pfizer); Inject 0.3 mLs into the muscle once for 1 dose.  Dispense: 0.3 mL; Refill: 0  Liver fibrosis -     Hepatic function panel; Future -      Protime-INR; Future  Hyperlipidemia with target LDL less than 100- LDL goal achieved. Doing well on the statin  -     Lipid panel; Future -     TSH; Future -     Rosuvastatin  Calcium ; Take 1 tablet (10 mg total) by mouth daily.  Dispense: 90 tablet; Refill: 1  Encounter for general adult medical examination with abnormal findings - Exam completed, labs reviewed, vaccines reviewed, cancer screenings addressed, pt ed material was given.   Primary hypertension- BP is well controlled. -     Basic metabolic panel with GFR; Future -     CBC with Differential/Platelet; Future -     TSH; Future -     Urinalysis, Routine w reflex microscopic; Future  Memory loss or impairment -     [facility] AD-Detect Phosphorylated [id number]), Plasma; Future  Benign prostatic hyperplasia with urinary frequency -     PSA; Future -     Urinalysis, Routine w reflex microscopic; Future     Follow-up: Return in about 3 months (around 07/29/2024).  Debby Molt, MD

## 2024-04-28 NOTE — Patient Instructions (Signed)
 Health Maintenance, Male  Adopting a healthy lifestyle and getting preventive care are important in promoting health and wellness. Ask your health care provider about:  The right schedule for you to have regular tests and exams.  Things you can do on your own to prevent diseases and keep yourself healthy.  What should I know about diet, weight, and exercise?  Eat a healthy diet    Eat a diet that includes plenty of vegetables, fruits, low-fat dairy products, and lean protein.  Do not eat a lot of foods that are high in solid fats, added sugars, or sodium.  Maintain a healthy weight  Body mass index (BMI) is a measurement that can be used to identify possible weight problems. It estimates body fat based on height and weight. Your health care provider can help determine your BMI and help you achieve or maintain a healthy weight.  Get regular exercise  Get regular exercise. This is one of the most important things you can do for your health. Most adults should:  Exercise for at least 150 minutes each week. The exercise should increase your heart rate and make you sweat (moderate-intensity exercise).  Do strengthening exercises at least twice a week. This is in addition to the moderate-intensity exercise.  Spend less time sitting. Even light physical activity can be beneficial.  Watch cholesterol and blood lipids  Have your blood tested for lipids and cholesterol at 69 years of age, then have this test every 5 years.  You may need to have your cholesterol levels checked more often if:  Your lipid or cholesterol levels are high.  You are older than 69 years of age.  You are at high risk for heart disease.  What should I know about cancer screening?  Many types of cancers can be detected early and may often be prevented. Depending on your health history and family history, you may need to have cancer screening at various ages. This may include screening for:  Colorectal cancer.  Prostate cancer.  Skin cancer.  Lung  cancer.  What should I know about heart disease, diabetes, and high blood pressure?  Blood pressure and heart disease  High blood pressure causes heart disease and increases the risk of stroke. This is more likely to develop in people who have high blood pressure readings or are overweight.  Talk with your health care provider about your target blood pressure readings.  Have your blood pressure checked:  Every 3-5 years if you are 24-52 years of age.  Every year if you are 3 years old or older.  If you are between the ages of 60 and 72 and are a current or former smoker, ask your health care provider if you should have a one-time screening for abdominal aortic aneurysm (AAA).  Diabetes  Have regular diabetes screenings. This checks your fasting blood sugar level. Have the screening done:  Once every three years after age 66 if you are at a normal weight and have a low risk for diabetes.  More often and at a younger age if you are overweight or have a high risk for diabetes.  What should I know about preventing infection?  Hepatitis B  If you have a higher risk for hepatitis B, you should be screened for this virus. Talk with your health care provider to find out if you are at risk for hepatitis B infection.  Hepatitis C  Blood testing is recommended for:  Everyone born from [birth year] through 1966.  Anyone  with known risk factors for hepatitis C.  Sexually transmitted infections (STIs)  You should be screened each year for STIs, including gonorrhea and chlamydia, if:  You are sexually active and are younger than 69 years of age.  You are older than 69 years of age and your health care provider tells you that you are at risk for this type of infection.  Your sexual activity has changed since you were last screened, and you are at increased risk for chlamydia or gonorrhea. Ask your health care provider if you are at risk.  Ask your health care provider about whether you are at high risk for HIV. Your health care provider  may recommend a prescription medicine to help prevent HIV infection. If you choose to take medicine to prevent HIV, you should first get tested for HIV. You should then be tested every 3 months for as long as you are taking the medicine.  Follow these instructions at home:  Alcohol use  Do not drink alcohol if your health care provider tells you not to drink.  If you drink alcohol:  Limit how much you have to 0-2 drinks a day.  Know how much alcohol is in your drink. In the [place] one drink equals one 12 oz bottle of beer (355 mL), one 5 oz glass of wine (148 mL), or one 1 oz glass of hard liquor (44 mL).  Lifestyle  Do not use any products that contain nicotine or tobacco. These products include cigarettes, chewing tobacco, and vaping devices, such as e-cigarettes. If you need help quitting, ask your health care provider.  Do not use street drugs.  Do not share needles.  Ask your health care provider for help if you need support or information about quitting drugs.  General instructions  Schedule regular health, dental, and eye exams.  Stay current with your vaccines.  Tell your health care provider if:  You often feel depressed.  You have ever been abused or do not feel safe at home.  Summary  Adopting a healthy lifestyle and getting preventive care are important in promoting health and wellness.  Follow your health care provider's instructions about healthy diet, exercising, and getting tested or screened for diseases.  Follow your health care provider's instructions on monitoring your cholesterol and blood pressure.  This information is not intended to replace advice given to you by your health care provider. Make sure you discuss any questions you have with your health care provider.  Document Revised: 10/11/2020 Document Reviewed: 10/11/2020  Elsevier Patient Education  2024 [employer].

## 2024-04-29 ENCOUNTER — Other Ambulatory Visit: Payer: Self-pay | Admitting: Internal Medicine

## 2024-04-29 DIAGNOSIS — N401 Enlarged prostate with lower urinary tract symptoms: Secondary | ICD-10-CM

## 2024-04-29 DIAGNOSIS — N201 Calculus of ureter: Secondary | ICD-10-CM

## 2024-04-29 LAB — URINALYSIS, ROUTINE W REFLEX MICROSCOPIC
Bilirubin Urine: NEGATIVE
Hgb urine dipstick: NEGATIVE
Ketones, ur: NEGATIVE
Leukocytes,Ua: NEGATIVE
Nitrite: NEGATIVE
RBC / HPF: NONE SEEN (ref 0–?)
Specific Gravity, Urine: 1.02 (ref 1.000–1.030)
Total Protein, Urine: NEGATIVE
Urine Glucose: NEGATIVE
Urobilinogen, UA: 0.2 (ref 0.0–1.0)
pH: 6.5 (ref 5.0–8.0)

## 2024-04-29 MED ORDER — ROSUVASTATIN CALCIUM 10 MG PO TABS: 10.0000 mg | ORAL_TABLET | Freq: Every day | ORAL | 1 refills | Status: AC

## 2024-05-01 LAB — QUEST AD-DETECT PHOSPHORYLATED TAU217(P-TAU217), PLASMA: Quest Detect PTAU217, Plasma: 0.06 pg/mL (ref <=0.15)

## 2024-05-05 ENCOUNTER — Ambulatory Visit: Payer: Self-pay | Admitting: Internal Medicine

## 2024-05-10 ENCOUNTER — Other Ambulatory Visit: Payer: Self-pay | Admitting: Internal Medicine

## 2024-06-03 ENCOUNTER — Encounter: Payer: Self-pay | Admitting: Internal Medicine

## 2024-06-14 ENCOUNTER — Other Ambulatory Visit: Payer: Self-pay | Admitting: Internal Medicine

## 2024-06-20 ENCOUNTER — Other Ambulatory Visit: Payer: Self-pay | Admitting: Internal Medicine

## 2024-06-20 DIAGNOSIS — L299 Pruritus, unspecified: Secondary | ICD-10-CM

## 2024-06-20 DIAGNOSIS — E119 Type 2 diabetes mellitus without complications: Secondary | ICD-10-CM

## 2024-06-22 ENCOUNTER — Encounter: Payer: Self-pay | Admitting: Internal Medicine

## 2024-07-24 ENCOUNTER — Ambulatory Visit: Admitting: Orthopaedic Surgery

## 2024-08-26 ENCOUNTER — Ambulatory Visit

## 2024-08-29 ENCOUNTER — Ambulatory Visit
